# Patient Record
Sex: Male | Born: 1975 | Race: Black or African American | Hispanic: No | Marital: Married | Smoking: Former smoker
Health system: Southern US, Community
[De-identification: ages and names within clinical notes are randomized; demographics above are authoritative.]

## PROBLEM LIST (undated history)

## (undated) DIAGNOSIS — I639 Cerebral infarction, unspecified: Secondary | ICD-10-CM

## (undated) DIAGNOSIS — I10 Essential (primary) hypertension: Secondary | ICD-10-CM

---

## 2001-10-02 ENCOUNTER — Emergency Department (HOSPITAL_COMMUNITY): Admission: EM | Admit: 2001-10-02 | Discharge: 2001-10-02 | Payer: Self-pay

## 2006-11-12 DIAGNOSIS — I10 Essential (primary) hypertension: Secondary | ICD-10-CM | POA: Insufficient documentation

## 2006-11-19 ENCOUNTER — Inpatient Hospital Stay (HOSPITAL_COMMUNITY): Admission: EM | Admit: 2006-11-19 | Discharge: 2006-11-23 | Payer: Self-pay | Admitting: Emergency Medicine

## 2006-11-19 ENCOUNTER — Ambulatory Visit: Payer: Self-pay | Admitting: Internal Medicine

## 2006-11-28 ENCOUNTER — Ambulatory Visit: Payer: Self-pay | Admitting: Internal Medicine

## 2006-11-28 ENCOUNTER — Encounter (INDEPENDENT_AMBULATORY_CARE_PROVIDER_SITE_OTHER): Payer: Self-pay | Admitting: *Deleted

## 2006-11-29 DIAGNOSIS — N19 Unspecified kidney failure: Secondary | ICD-10-CM | POA: Insufficient documentation

## 2006-11-30 ENCOUNTER — Encounter (INDEPENDENT_AMBULATORY_CARE_PROVIDER_SITE_OTHER): Payer: Self-pay | Admitting: *Deleted

## 2006-12-31 ENCOUNTER — Ambulatory Visit: Payer: Self-pay | Admitting: Surgery

## 2007-02-18 ENCOUNTER — Telehealth: Payer: Self-pay | Admitting: *Deleted

## 2007-04-19 ENCOUNTER — Telehealth: Payer: Self-pay | Admitting: *Deleted

## 2007-04-24 ENCOUNTER — Ambulatory Visit: Payer: Self-pay | Admitting: Infectious Disease

## 2007-04-24 DIAGNOSIS — E785 Hyperlipidemia, unspecified: Secondary | ICD-10-CM

## 2007-12-10 ENCOUNTER — Telehealth: Payer: Self-pay | Admitting: Internal Medicine

## 2008-01-13 ENCOUNTER — Telehealth (INDEPENDENT_AMBULATORY_CARE_PROVIDER_SITE_OTHER): Payer: Self-pay | Admitting: Internal Medicine

## 2008-02-14 ENCOUNTER — Telehealth: Payer: Self-pay | Admitting: Infectious Diseases

## 2008-04-28 ENCOUNTER — Telehealth: Payer: Self-pay | Admitting: *Deleted

## 2010-08-09 NOTE — Discharge Summary (Signed)
Joseph Bright, Joseph Bright              ACCOUNT NO.:  [id number]   MEDICAL RECORD NO.:  [id number]          PATIENT TYPE:  INP   LOCATION:  6737                         FACILITY:  [facility]   PHYSICIAN:  C. Ulyess Mort, M.D.DATE OF BIRTH:  06-27-1975   DATE OF ADMISSION:  11/19/2006  DATE OF DISCHARGE:  11/23/2006                               DISCHARGE SUMMARY   DISCHARGE DIAGNOSES:  1. Hypertension.  2. Renal insufficiency, likely chronic.  3. Headaches, likely secondary to #1.  4. Proteinuria.  5. Anemia, likely due to chronic renal failure.  6. Hypercholesterolemia.   DISCHARGE MEDICATIONS:  1. Norvasc 10 mg by mouth daily.  2. Lasix 40 mg by mouth daily.  3. Labetalol 200 mg by mouth twice daily.  4. Zocor 20 mg by mouth daily.  5. Renagel 800 mg by mouth three times daily before meals.   DISPOSITION:  The patient is to follow up in the [facility] as well as with [facility] by  calling and scheduling an appointment on Tuesday, September 2.  During  those visits, he will have repeat complete metabolic panel as well as  magnesium and phosphorus checked.  He should also have a CBC at that  time.   PROCEDURE:  1. A CT of the head without contrast performed on November 19, 2006,      demonstrated subtle changes of periventricular white matter      suggesting microvascular disease, but no definite acute or focal      abnormality.  2. A chest x-ray performed on November 19, 2006, demonstrated no active      disease.  3. A renal ultrasound dated November 19, 2006, demonstrated no      hydronephrosis.  4. Electrocardiogram performed on November 19, 2006, demonstrated      nonspecific ST and T wave abnormalities in the lateral precordial      leads, but these were not deemed significant enough to associate      with myocardial infarction.   CONSULTATIONS:  Garnetta Buddy, M.D. of [facility].   HISTORY OF PRESENT  ILLNESS:  The patient is a 35 year old African-  American gentleman with no significant past medical history who presents  with complaints of headache with blurred vision of approximately two  days duration.  The patient reported a history of headaches in the past,  typically frontal or retro-orbital and squeezing in character, but  described his new headaches as feeling different.  The patient denied  that this headache was the worst headache of his life.  He described the  pain as occipital, near constant, and tight/squeezing.  He did endorse  vision changes for several days preceding his admission, reporting some  blurry vision since that time.  The patient reported the blurring to be  bilateral and to accompanied by some lightheadedness, but no syncope or  falls.  He denied nausea, vomiting, diarrhea, chest pain, or abdominal  pain.  The patient admitted to intermittent occasional dyspnea on  exertion with bilateral calf pain, but only at night while lying in bed.  The patient came to the ED secondary to concerns about persistence of  his headache and vision changes, which were severe enough to make him  almost unable to drive the morning of admission.  He has no history of  seizures, neck pain, neck stiffness, or photophobia.   PHYSICAL EXAMINATION:  VITAL SIGNS:  On admission temperature 99.2  degrees Fahrenheit, blood pressure 240/159, pulse 113, respirations 18,  oxygen saturation 100% on room air.  Weight was 116.6 kg and height was  68 inches.  Repeat blood pressure three hours after the initial  measurement was taken revealed a pressure of 169/103 and a pulse of 76.  GENERAL:  Awake, alert and oriented x3, lying in bed, in no acute  distress.  HEENT:  Normocephalic and atraumatic.  Pupils equal, round, and reactive  to light and accommodation.  Extraocular movements intact.  Oropharynx  clear, moist mucosal membrane.  NECK:  Supple without lymphadenopathy or thyromegaly.  No  bruits or JVD.  LUNGS:  Distant breath sounds bilaterally but no wheezes, rales, or  rhonchi.  CARDIOVASCULAR:  Regular rate and rhythm, normal S1 and S2.  No murmurs,  rubs, or gallops.  GASTROINTESTINAL:  Obese, soft, nontender, nondistended.  Normoactive  bowel sounds.  No masses.  EXTREMITIES:  Warm and well perfused without cyanosis, clubbing, or  edema.  SKIN:  No rashes.  MUSCULOSKELETAL:  Full range of motion with 5/5 strength in the  bilateral upper and lower extremities.  NEUROLOGY:  Grossly intact without focal deficits.  Cranial nerves II-  XII grossly intact.  PSYCHIATRIC:  Appropriate affect, interactive.   LABORATORY DATA:  Laboratory studies on the day of admission revealed:  Hemoglobin 13.7, hematocrit 39.6, white blood cell count 7.6, platelet  count 146, MCV 91.1, RDW 14.0, ANC 5.3.  Sodium 137, potassium 3.1,  chloride 103, bicarbonate 27, BUN 31, creatinine 4.8, glucose 103.  A  urinalysis was negative except for greater than 300 protein, and large  blood.  Urine microscopy was unremarkable.  Urine sodium was 33, urine  creatinine 212.8, the urine microalbumin to creatinine ratio was 2054.3.  A urine drug screen was negative.  TSH and free T4 were within normal  limits.   HOSPITAL COURSE:  Hypertension.  The patient's hypertension was  initially managed with intravenous medications in the emergency  department.  The patient remained on intravenous labetalol and was  transferred initially to a stepdown unit.  On the day following  admission, the patient was seen and evaluated by Dr. Hyman Hopes of [facility], who recommended a switch from intravenous medications  to oral medications.  These included Norvasc, Lasix, and labetalol.  The  patient's blood pressure responded well to these medications and by the  second day of admission was down to 161/99.  On the days following  admission the patient's blood pressure steadily declined reaching the  140's  to 150's over 80's to 100's on the day of discharge.  As part of  the patient's workup for possible causes of hypertension, many different  studies were done.  A serum renin activity was within normal limits.  Serum protein electrophoresis was essentially unremarkable, with no M-  spike.  An antinuclear antibody test as well as DNAse-B antibody test  was normal.  Intact parathyroid hormone was 170.5, which is elevated.  A  screen for hepatitis B and C antibodies were negative.  An HIV test was  negative.  A neutrophil cytoplasmic antibody IgG screen (ANCA) was  negative.  Metanephrines were unremarkable.  Serum aldosterone was  normal.  Finally, C3 and C4 complement levels were normal.  After ruling  out all of these other potential causes of secondary hypertension, it  was believed that the patient was suffering from essential hypertension.  1. Renal insufficiency.  The patient's creatinine on admission was 4.8      and failed to diminish significantly during this hospitalization,      with a discharge creatinine of 4.35.  In spite of the ultrasound      demonstrating relatively normal-sized kidneys, it was believed that      the patient was suffering from a chronic issue of hypertension      which normally leads to smaller or shrunken kidneys.  Dr. 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  As mentioned above, the      entire workup for other causes of hypertension (aside from benign      essential) was negative.  The patient is to follow up with Dr. Hyman Hopes      as an outpatient to discuss options for long term renal care,      likely including establishing access for hemodialysis.  2. Headaches with vision changes.  On the day of admission, the      patient reported a several-day history of headaches with blurring      of his vision.  It was  discussed with the patient at that time that      this change in vision may or may not be related to his      significantly elevated blood pressure.  However, during his      hospitalization, the patient's vision slowly improved and his      headaches resolved with no therapy aside from the treatment of his      hypertension.  On the day of discharge, the patient reported that      his vision had returned to near normal and that his headaches had      completely resolved.  It was strongly recommended to the patient      that he establish care with an ophthalmologist in the community to      have yearly eye exams.  3. Electrolyte anomalies likely related to renal insufficiency.      During his hospitalization, the patient did develop transient      hypokalemia (as low as 3.3), but remained asymptomatic.  This      responded well to oral potassium chloride.  Also, the patient was      noted to have a normal calcium during this hospitalization but an      elevated phosphorous (as high as 5.3).  This was attributed to a      secondary hyperparathyroidism, likely a result of this patient's      renal insufficiency.  Given these findings, the patient was started      on oral Renagel on the day before discharge.  On the day of      discharge, the patient's phosphorous had fallen to 4.9.  His      calcium remained normal, and his potassium was 3.4.  4. Anemia.  Given that the patient's admission hemoglobin and      hematocrit were normal, that he received IV fluids after admission,  and a repeat hemoglobin hematocrit on the 2nd day of admission was      slightly low, it was felt that this transient anemia was most      likely due to a dilutional effect.  However, a component of renal      failure contributing to this anemia cannot be ruled out.  5. Hypercholesterolemia.  Fasting lipid panel on the day following      admission showed a cholesterol of 239 with an LDL of 159, an HDL of      29,  and triglycerides of 256.  The patient was subsequently started      on Zocor 20 mg by mouth daily.  His liver function was normal on      the day of discharge.   DISCHARGE LABORATORY:  Laboratory studies on the day of admission  revealed:  Hemoglobin 12, hematocrit 34.2, white blood cell count 6.8,  platelet count 171.  Sodium 136, potassium 3.4, chloride 97, bicarbonate  28, BUN 30, creatinine 4.35, glucose 98.  AST 25, ALT 26, total protein  6.5, total bilirubin 0.9, alkaline phosphatase 81, calcium 9.7, albumin  3.7.  Magnesium was 1.9.  Phosphorous was 4.9.   DISCHARGE VITAL SIGNS:  Vital signs on the day of admission were:  Temperature 98.2 degrees Fahrenheit, blood pressure 154/104, pulse 81,  respirations 20.  Oxygen saturation was 99% on room air.      Madelaine Etienne, MD  Electronically Signed      C. Ulyess Mort, M.D.  Electronically Signed    JH/MEDQ  D:  12/23/2006  T:  12/23/2006  Job:  [id number]

## 2010-08-09 NOTE — Assessment & Plan Note (Signed)
OFFICE VISIT   Joseph Bright, Joseph Bright  DOB:  1976-01-03                                       12/31/2006  YNWGN#:[id number]   REASON FOR VISIT:  End-stage renal disease to evaluate for dialysis.   HISTORY:  This is a 35 year old gentleman with end-stage renal disease  not yet requiring dialysis who comes in for fistula creation.  His end-  stage renal disease is likely secondary to high blood pressure.  Again,  he is not requiring dialysis at this time.   REVIEW OF SYSTEMS:  All negative.   SOCIAL HISTORY:  He is single with 2 children.  Works in the healthcare  profession.  Does not smoke.  He has a history of smoking, but quit in  2000.  Does not drink.   PAST MEDICAL HISTORY:  Hypertension and hypercholesterolemia.   PAST SURGICAL HISTORY:  None.   MEDICATIONS:  Amlodipine 10 mg once a day.  Lasix 40 mg once a day.  Renagel 800 mg 3 pills daily.  Labetalol 200 mg twice daily.   PHYSICAL EXAM:  Heart rate 82, respirations 16, blood pressure 152/96,  saturation 100%.  General:  Well-appearing, in no acute distress.  Cardiovascular is regular.  Respirations nonlabored.  Abdomen is soft.  Extremities visible left cephalic vein at the level of the wrist.  Palpable radial pulse.   DIAGNOSTIC STUDIES:  Left upper arm vein mapping was performed today,  which shows the cephalic vein to be adequate to the level of the wrist.   ASSESSMENT AND PLAN:  End-stage renal disease requiring fistula  placement.   PLAN:  In this right-handed gentleman, we will plan on implanting a left  wrist fistula.  I discussed with the patient the likelihood of maturity  is about 85%.  There is a small risk of steal, which we discussed, which  would require ligation possibly of his graft.  The patient has not yet  made his ultimate decision of how he wishes to receive his dialysis,  whether it be peritoneal versus hemodialysis.  He is supposed to see his  kidney doctor today and he  will make this decision this week, and then  he will call the office to schedule this procedure.  If he elects for  hemodialysis, he will need a left wrist fistula.  Otherwise, we will do  a PV catheter placement.   Jorge Ny, MD  Electronically Signed   VWB/MEDQ  D:  12/31/2006  T:  01/01/2007  Job:  [id number]

## 2010-08-09 NOTE — Consult Note (Signed)
Joseph Bright, Joseph Bright NO.:  [id number]   MEDICAL RECORD NO.:  [id number]          PATIENT TYPE:  INP   LOCATION:  2928                         FACILITY:  [facility]   PHYSICIAN:  Garnetta Buddy, M.D.   DATE OF BIRTH:  1976-02-12   DATE OF CONSULTATION:  DATE OF DISCHARGE:                                 CONSULTATION   One-week history of feeling week, fatigued, nauseated with a one-day  history of severe headache, presented to the emergency room with a blood  pressure greater than 200/110, is treated as malignant hypertension, was  found to have an elevated creatinine of greater than 4 and a  protein/creatinine ratio of greater than 2.   PAST MEDICAL HISTORY:  Pertinent for:  1. Diagnosed hypertension possibly one year ago.  2. Also has a history of obesity and obstructive sleep apnea.   MEDICATIONS:  No regular medications, uses occasional ibuprofen.   SOCIAL HISTORY:  Lives with girlfriend, no HIV risk factors, no alcohol,  no cocaine, no tobacco, works at [employer].   REVIEW OF SYSTEMS:  GENERAL:  Admits to weakness and fatigue for about a  week.  EYES:  No visual loss, no diplopia, no blurred vision.  EARS/NOSE/MOUTH/THROAT:  No sinusitis, no sore throat, no epistaxis.  CARDIOVASCULAR:  No chest pain, palpitations, shortness of breath or  orthopnea.  RESPIRATORY SYSTEM:  No hemoptysis, no cough.  ABDOMINAL  SYSTEM:  No nausea, vomiting, diarrhea or weight loss.  UROGENITAL:  No  hematuria or frothy foaming noted in urine, no dysuria, no history of  STDs, nocturia x2, hesitancy and urgency x1 week.  MUSCULOSKELETAL:  Joint pains with use of ibuprofen, back pain with use of ibuprofen.  DERMATOLOGIC:  No rashes.   FAMILY HISTORY:  Positive for hypertension.   PHYSICAL EXAMINATION:  GENERAL:  Alert, very pleasant gentleman, no  obvious distress.  VITAL SIGNS:  Blood pressure 180/100, pulse of 90, temperature 99.5.  I's & O's:  1400 in, 1500 out.  HEENT:  Mild periorbital edema.  No pallor.  No jaundice.  Eyes revealed  grade 3/4 hypertensive changes with exudates, wool spots and flame  hemorrhages  NECK:  Supple.  JVP not elevated.  No carotid bruits.  CARDIOVASCULAR:  Regular rate and rhythm.  Left ventricular heave.  S4  gallop.  RESPIRATORY SYSTEM:  Lung fields are clear.  No wheeze or rales.  ABDOMEN:  Soft, nontender.  Bowel sounds present.  UROGENITAL:  Circumcised penis.  Testes were soft and no masses.  EXTREMITIES:  No peripheral edema.  Peripheral pulses intact.   Chest x-ray:  No pulmonary edema.   EKG:  LVH.   Blood sodium 140, potassium 3.6, chloride 104, CO2 29, BUN 30,  creatinine 4.7, glucose 105.  LDL 159, WBC 7, hemoglobin 11.4, platelets  126, TSH 1.7.  Urine drug screen negative.  Urine microscopy  unremarkable, but positive for large blood.   ASSESSMENT/PLAN:  1. Renal insufficiency, doubt acute, probably represents chronic      kidney disease secondary to hypertensive nephrosclerosis with      malignant hypertension component.  I think his hypertension and his      renal insufficiency are both chronic issues, as he has a bland      urine sediment and stigmata of severe prolonged hypertension.      Would recommend catecholamine screen as well as renal aldosterone      access evaluation.  Will check renal ultrasound, hep B, hep C, HIV,      SPEP, UPEP, and plan access if chronic renal insufficiency.  2. Hypertension.  Will switch from IV to oral Norvasc, continue oral      labetalol 200 b.i.d. and Lasix 40 mg once a day.   Will follow while in the hospital.      Garnetta Buddy, M.D.  Electronically Signed     MWW/MEDQ  D:  11/20/2006  T:  11/20/2006  Job:  [id number]

## 2010-08-09 NOTE — Procedures (Signed)
CEPHALIC VEIN MAPPING   INDICATION:  End-stage renal disease.   HISTORY:  End-stage renal disease.   EXAM:   The right cephalic vein is not evaluated.   The left cephalic vein is compressible.   Diameter measurements range from 0.29 cm to 0.37 cm.   See attached worksheet for all measurements.   IMPRESSION:  Patent's left cephalic vein, which is of acceptable  diameter for use as a dialysis access site.   ___________________________________________  V. Charlena Cross, MD   MG/MEDQ  D:  12/31/2006  T:  12/31/2006  Job:  [id number]

## 2010-08-09 NOTE — Discharge Summary (Signed)
[name], [name]              ACCOUNT NO.:  [id number]   MEDICAL RECORD NO.:  [id number]          PATIENT TYPE:  INP   LOCATION:  6737                         FACILITY:  [facility]   PHYSICIAN:  Joseph Bright, M.D.DATE OF BIRTH:  Nov 30, 1975   DATE OF ADMISSION:  11/19/2006  DATE OF DISCHARGE:  11/23/2006                               DISCHARGE SUMMARY   CONTINUATION OF DISCHARGE SUMMARY:   HOSPITAL COURSE:  1. ... After ruling out all of these other potential causes of      secondary hypertension, it was believed that the patient was      suffering from essential hypertension.  2. Renal insufficiency.  The patient's creatinine on admission was 4.8      and failed to diminish significantly during this hospitalization,      with a discharge creatinine of 4.35.  In spite of the ultrasound      demonstrating relatively normal-sized kidneys, it was believed that      the patient was suffering from a chronic issue of hypertension      which normally leads to smaller or shrunken kidneys.  Dr. 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  As mentioned above, the      entire workup for other causes of hypertension (aside from benign      essential) was negative.  The patient is to follow up with Dr. Hyman Hopes      as an outpatient to discuss options for long term renal care,      likely including establishing access for hemodialysis.  3. Headaches with vision changes.  On the day of admission, the      patient reported a several-day history of headaches with blurring      of his vision.  It was discussed with the patient at that time that      this change in vision may or may not be related to his      significantly elevated blood pressure.  However, during his      hospitalization, the patient's vision slowly improved and his      headaches resolved  with no therapy aside from the treatment of his      hypertension.  On the day of discharge, the patient reported that      his vision had returned to near normal and that his headaches had      completely resolved.  It was strongly recommended to the patient      that he establish care with an ophthalmologist in the community to      have yearly eye exams.  4. Electrolyte anomalies likely related to renal insufficiency.      During his hospitalization, the patient did develop transient      hypokalemia (as low as 3.3), but remained asymptomatic.  This      responded well to oral potassium chloride.  Also, the patient was  noted to have a normal calcium during this hospitalization but an      elevated phosphorous (as high as 5.3).  This was attributed to a      secondary hyperparathyroidism, likely a result of this patient's      renal insufficiency.  Given these findings, the patient was started      on oral Renagel on the day before discharge.  On the day of      discharge, the patient's phosphorous had fallen to 4.9.  His      calcium remained normal, and his potassium was 3.4.  5. Anemia.  Given that the patient's admission hemoglobin and      hematocrit were normal, that he received IV fluids after admission,      and a repeat hemoglobin hematocrit on the 2nd day of admission was      slightly low, it was felt that this transient anemia was most      likely due to a dilutional effect.  However, a component of renal      failure contributing to this anemia cannot be ruled out.  6. Hypercholesterolemia.  Fasting lipid panel on the day following      admission showed a cholesterol of 239 with an LDL of 159, an HDL of      29, and triglycerides of 256.  The patient was subsequently started      on Zocor 20 mg by mouth daily.  His liver function was normal on      the day of discharge.   DISCHARGE LABORATORY:  Laboratory studies on the day of admission  revealed:  Hemoglobin 12,  hematocrit 34.2, white blood cell count 6.8,  platelet count 171.  Sodium 136, potassium 3.4, chloride 97, bicarbonate  28, BUN 30, creatinine 4.35, glucose 98.  AST 25, ALT 26, total protein  6.5, total bilirubin 0.9, alkaline phosphatase 81, calcium 9.7, albumin  3.7.  Magnesium was 1.9.  Phosphorous was 4.9.   DISCHARGE VITAL SIGNS:  Vital signs on the day of admission were:  Temperature 98.2 degrees Fahrenheit, blood pressure 154/104, pulse 81,  respirations 20.  Oxygen saturation was 99% on room air.      Joseph Etienne, MD       Joseph Bright, M.D.     JH/MEDQ  D:  12/23/2006  T:  12/23/2006  Job:  [id number]

## 2011-01-06 LAB — PROTEIN ELECTROPH W RFLX QUANT IMMUNOGLOBULINS
Albumin ELP: 59.3
Alpha-1-Globulin: 5.4 — ABNORMAL HIGH
Beta 2: 5.5
Beta Globulin: 6.1
Gamma Globulin: 14.8

## 2011-01-06 LAB — UIFE/LIGHT CHAINS/TP QN, 24-HR UR
Albumin, U: DETECTED
Alpha 1, Urine: DETECTED — AB
Free Kappa Lt Chains,Ur: 16.5 — ABNORMAL HIGH (ref 0.04–1.51)
Free Kappa/Lambda Ratio: 4.84 — ABNORMAL HIGH (ref 0.46–4.00)
Free Lambda Excretion/Day: 112.53
Time: 24
Total Protein, Urine: 61.5
Volume, Urine: 3300

## 2011-01-06 LAB — RENAL FUNCTION PANEL
Albumin: 3.6
CO2: 28
Calcium: 9
Chloride: 102
GFR calc Af Amer: 20 — ABNORMAL LOW
GFR calc Af Amer: 21 — ABNORMAL LOW
GFR calc non Af Amer: 16 — ABNORMAL LOW
GFR calc non Af Amer: 17 — ABNORMAL LOW
Phosphorus: 3.9
Potassium: 3.3 — ABNORMAL LOW
Sodium: 144

## 2011-01-06 LAB — RAPID URINE DRUG SCREEN, HOSP PERFORMED
Amphetamines: NOT DETECTED
Opiates: NOT DETECTED
Tetrahydrocannabinol: NOT DETECTED

## 2011-01-06 LAB — HEPATITIS B SURFACE ANTIGEN: Hepatitis B Surface Ag: NEGATIVE

## 2011-01-06 LAB — CBC
HCT: 32.7 — ABNORMAL LOW
HCT: 39.6
Hemoglobin: 11.4 — ABNORMAL LOW
Hemoglobin: 13.7
MCHC: 34.3
MCHC: 34.8
Platelets: 156
RBC: 3.56 — ABNORMAL LOW
RBC: 3.65 — ABNORMAL LOW
RBC: 3.74 — ABNORMAL LOW
RBC: 4.34
RDW: 14.1 — ABNORMAL HIGH
RDW: 14.2 — ABNORMAL HIGH
WBC: 6.8
WBC: 7.6
WBC: 7.8

## 2011-01-06 LAB — COMPREHENSIVE METABOLIC PANEL
ALT: 26
AST: 25
CO2: 28
Calcium: 9.7
Chloride: 97
GFR calc Af Amer: 19 — ABNORMAL LOW
GFR calc non Af Amer: 16 — ABNORMAL LOW
Sodium: 136
Total Bilirubin: 0.9

## 2011-01-06 LAB — DIFFERENTIAL
Basophils Absolute: 0.1
Basophils Relative: 1
Eosinophils Relative: 3
Eosinophils Relative: 4
Lymphocytes Relative: 15
Lymphocytes Relative: 20
Lymphs Abs: 1.1
Monocytes Absolute: 0.8 — ABNORMAL HIGH
Monocytes Absolute: 0.8 — ABNORMAL HIGH
Monocytes Relative: 12 — ABNORMAL HIGH
Neutro Abs: 4.5

## 2011-01-06 LAB — BASIC METABOLIC PANEL
BUN: 30 — ABNORMAL HIGH
CO2: 29
Calcium: 8.9
GFR calc non Af Amer: 15 — ABNORMAL LOW
Glucose, Bld: 105 — ABNORMAL HIGH

## 2011-01-06 LAB — CARDIAC PANEL(CRET KIN+CKTOT+MB+TROPI)
CK, MB: 2.7
Relative Index: 0.7
Troponin I: 0.1 — ABNORMAL HIGH

## 2011-01-06 LAB — CK TOTAL AND CKMB (NOT AT ARMC)
CK, MB: 3.3
Total CK: 451 — ABNORMAL HIGH

## 2011-01-06 LAB — CREATININE, URINE, RANDOM: Creatinine, Urine: 212.8

## 2011-01-06 LAB — URINALYSIS, ROUTINE W REFLEX MICROSCOPIC
Bilirubin Urine: NEGATIVE
Nitrite: NEGATIVE
Protein, ur: >300 — AB
Specific Gravity, Urine: 1.019
Urobilinogen, UA: 0.2

## 2011-01-06 LAB — I-STAT 8, (EC8 V) (CONVERTED LAB)
BUN: 31 — ABNORMAL HIGH
Glucose, Bld: 103 — ABNORMAL HIGH
HCT: 43
Hemoglobin: 14.6
Operator id: 151321
Potassium: 3.1 — ABNORMAL LOW
Sodium: 137
TCO2: 28

## 2011-01-06 LAB — TSH: TSH: 1.722

## 2011-01-06 LAB — HEPATITIS C ANTIBODY: HCV Ab: NEGATIVE

## 2011-01-06 LAB — LIPID PANEL
Cholesterol: 239 — ABNORMAL HIGH
HDL: 29 — ABNORMAL LOW
LDL Cholesterol: 159 — ABNORMAL HIGH
Total CHOL/HDL Ratio: 8.2
Triglycerides: 256 — ABNORMAL HIGH

## 2011-01-06 LAB — URINE MICROSCOPIC-ADD ON

## 2011-01-06 LAB — MICROALBUMIN / CREATININE URINE RATIO
Creatinine, Urine: 213.7
Microalb, Ur: 439 — ABNORMAL HIGH

## 2011-01-06 LAB — PTH, INTACT AND CALCIUM
Calcium, Total (PTH): 8.9
PTH: 170.5 — ABNORMAL HIGH

## 2011-01-06 LAB — HIV ANTIBODY (ROUTINE TESTING W REFLEX): HIV: NONREACTIVE

## 2011-01-06 LAB — METANEPHRINES, URINE, 24 HOUR: Normetanephrine, 24H Ur: 438 mcg/24 h (ref 44–540)

## 2011-01-06 LAB — T4, FREE: Free T4: 1.25

## 2015-04-01 ENCOUNTER — Other Ambulatory Visit: Payer: Self-pay | Admitting: Family Medicine

## 2015-04-01 ENCOUNTER — Ambulatory Visit
Admission: RE | Admit: 2015-04-01 | Discharge: 2015-04-01 | Disposition: A | Discharge status: Home / Self Care | Payer: Commercial Managed Care - HMO | Source: Ambulatory Visit | Attending: Family Medicine | Admitting: Family Medicine

## 2015-04-01 DIAGNOSIS — R7611 Nonspecific reaction to tuberculin skin test without active tuberculosis: Secondary | ICD-10-CM

## 2016-08-02 IMAGING — CR DG CHEST 2V
2 series · 2 of 2 positions shown · non-contrast
Comparison: November 19, 2006

CLINICAL DATA: Positive tuberculin skin test

EXAM:
CHEST  2 VIEW

[w chest pa]
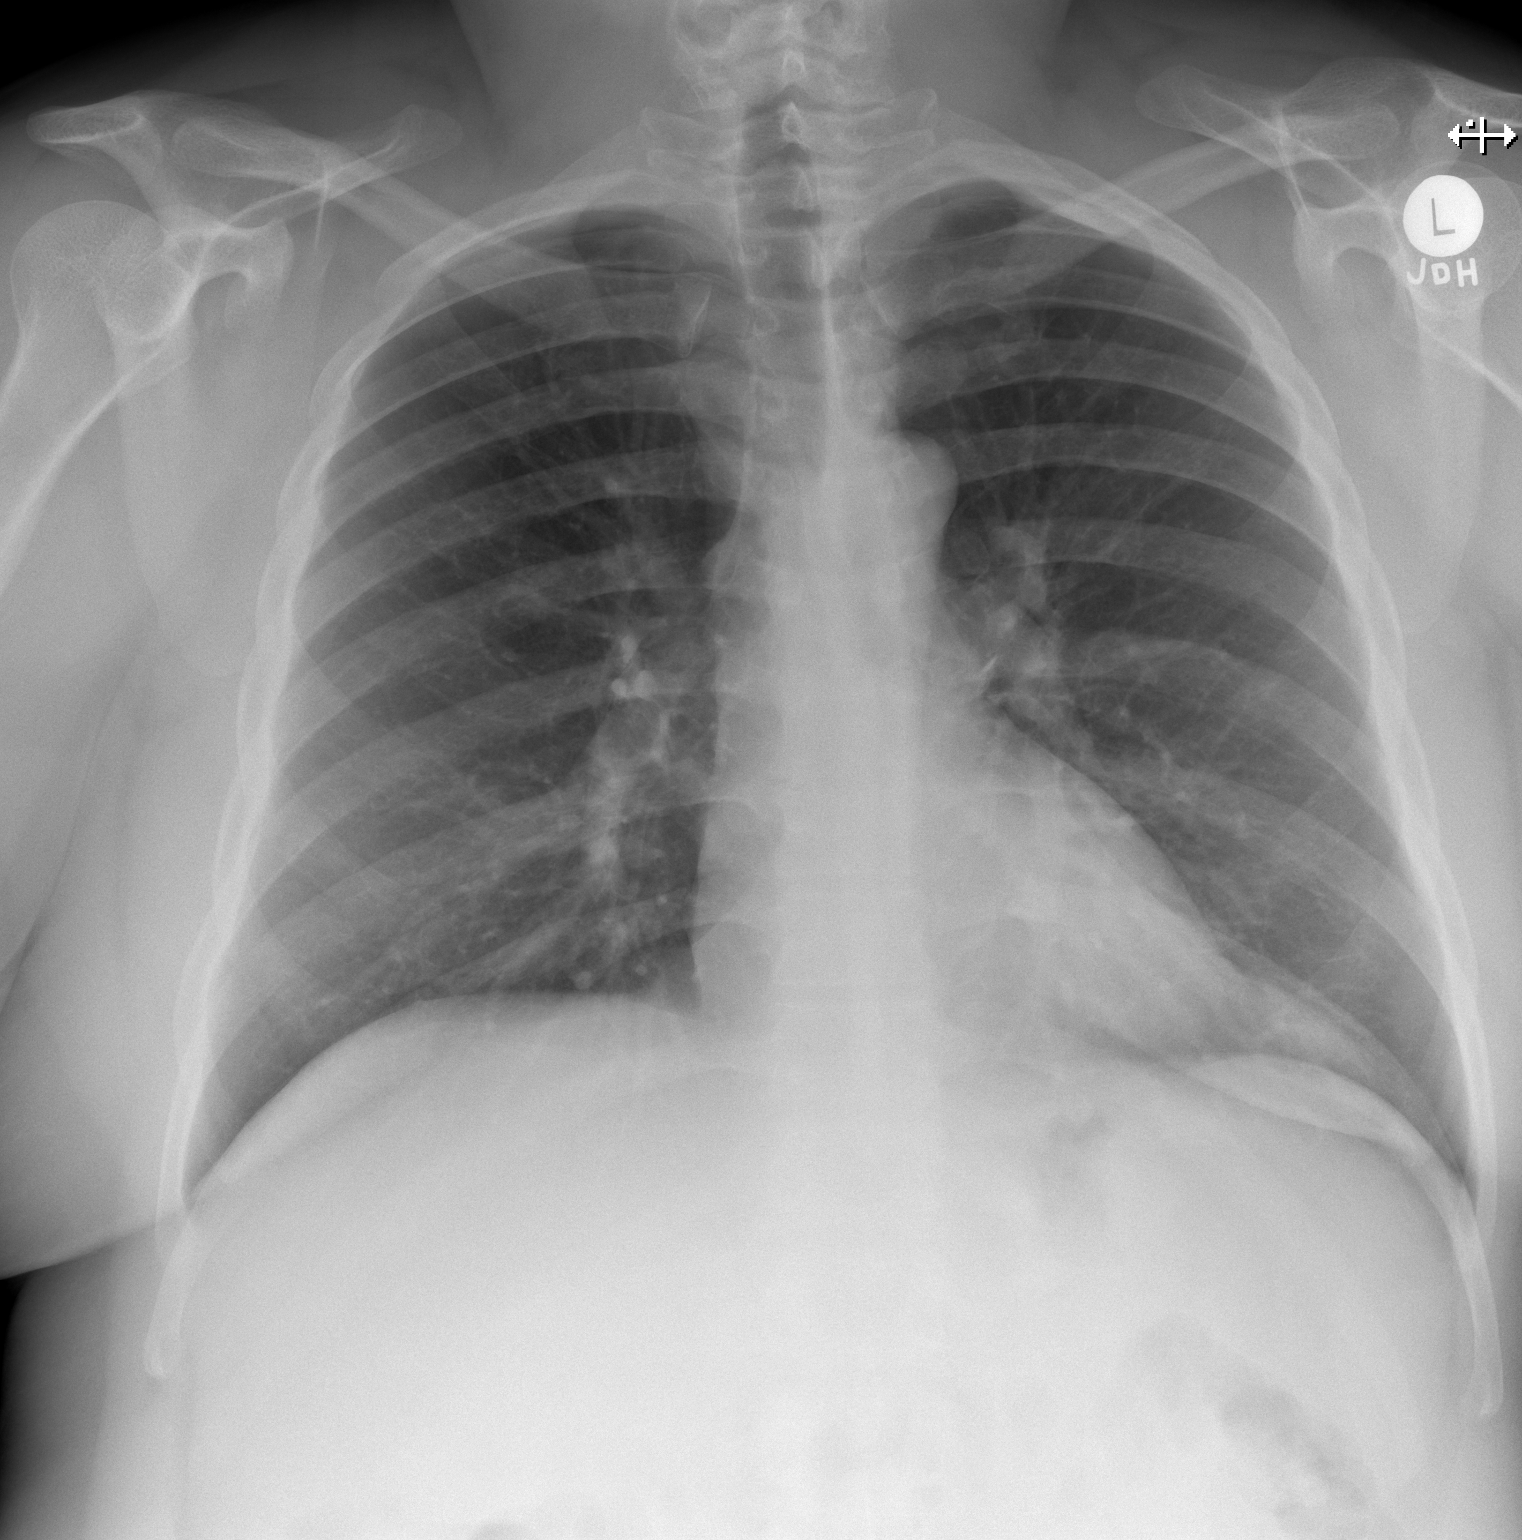

[w chest lat]
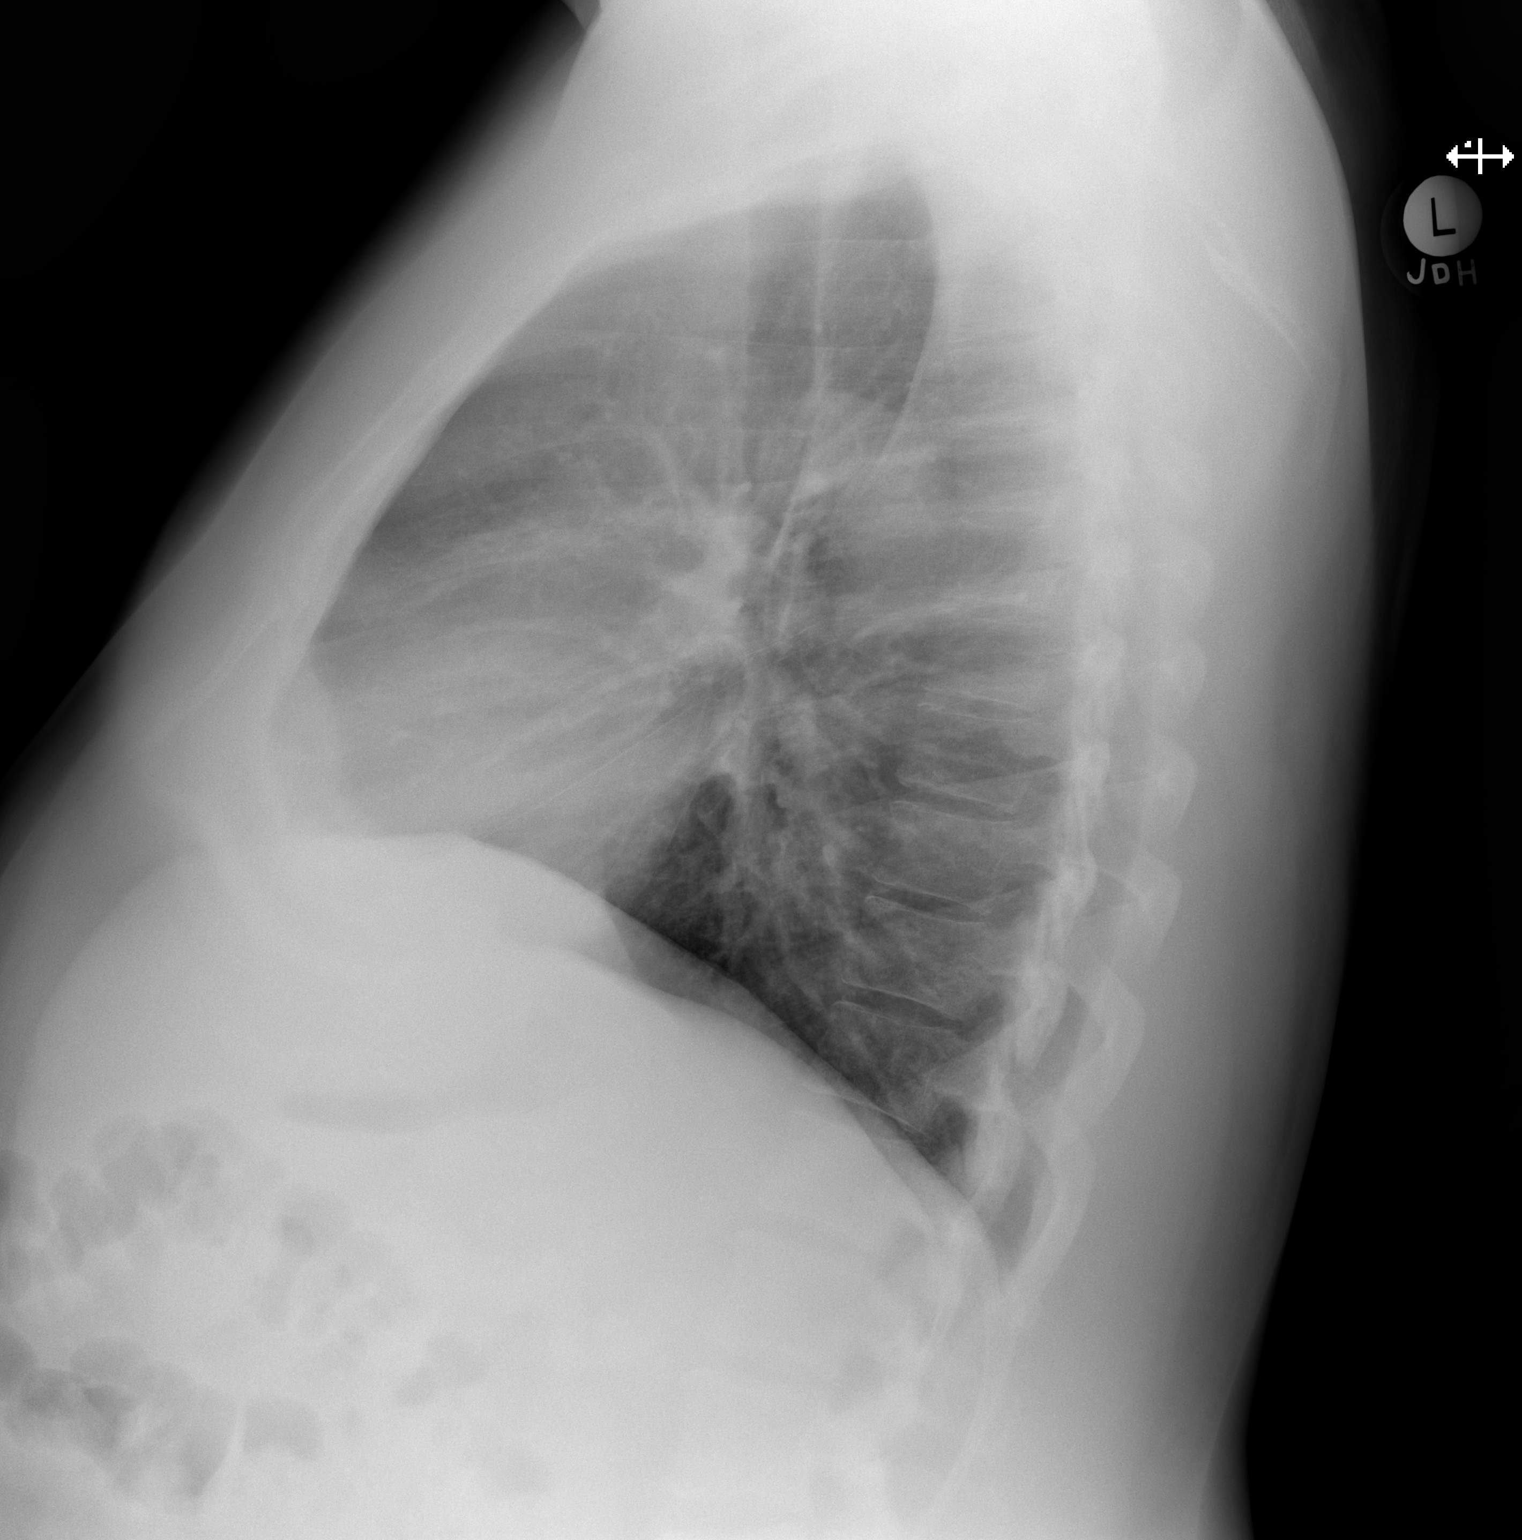

[2 of 2 positions shown; findings below may reference images not displayed]

FINDINGS: Lungs are clear. Heart size and pulmonary vascularity are normal. No
adenopathy. No bone lesions.
IMPRESSION: No abnormality noted.

## 2018-04-16 ENCOUNTER — Ambulatory Visit (INDEPENDENT_AMBULATORY_CARE_PROVIDER_SITE_OTHER): Payer: Managed Care, Other (non HMO) | Admitting: Family Medicine

## 2018-04-16 ENCOUNTER — Encounter (INDEPENDENT_AMBULATORY_CARE_PROVIDER_SITE_OTHER): Payer: Self-pay | Admitting: Family Medicine

## 2018-04-16 ENCOUNTER — Ambulatory Visit (INDEPENDENT_AMBULATORY_CARE_PROVIDER_SITE_OTHER): Payer: Managed Care, Other (non HMO)

## 2018-04-16 DIAGNOSIS — M5442 Lumbago with sciatica, left side: Secondary | ICD-10-CM

## 2018-04-16 MED ORDER — BACLOFEN 10 MG PO TABS: 10.0000 mg | ORAL_TABLET | Freq: Every evening | ORAL | 3 refills | Status: DC | PRN

## 2018-04-16 MED ORDER — TRAMADOL HCL 50 MG PO TABS: 50.0000 mg | ORAL_TABLET | Freq: Every evening | ORAL | 0 refills | Status: DC | PRN

## 2018-04-16 NOTE — Progress Notes (Signed)
Amlodipine 10 mg qd Hydralazine 50 mg bid Isosorbide ER 60 mg qd Labetalol 200 mg bid Simvastatin 20 mg qd     Office Visit Note   Patient: Joseph Bright           Date of Birth: [birth year]           MRN: [id number] Visit Date: 04/16/2018 Requested by: No referring provider defined for this encounter. PCP: Johny Blamer, MD  Subjective: Chief Complaint  Patient presents with  . Lower Back - Pain    Left lower back pain, radiating down lateral hip/thigh to knee x 1 year, but worse over the past 2 months. Occasional buttock pain. Occasional groin pain.    HPI: He is a 43 year old with low back and left posterior hip pain.  Symptoms started about 2 years ago, no injury.  Gradual onset of pain manageable at first with over-the-counter medicine, but in the past couple months it is gotten significantly worse and is keeping him awake at night.  He cannot seem to get comfortable.  Pain does not radiate below the knee, he has not noticed any weakness or numbness in his legs and does not have any bowel or bladder dysfunction.  No previous problems with his back.  He went to his PCP and was given a prednisone Dosepak which helped temporarily but the pain came right back when the medicine ended.               ROS: He has hypertension, chronic kidney disease and hyperlipidemia.  Other systems were reviewed and are negative.  Objective: Vital Signs: There were no vitals taken for this visit.  Physical Exam:  Low back: No visible rash, he is tender near the left sacroiliac joint and in the paraspinous muscles near the L5 level.  No pain in the sciatic notch, negative straight leg raise, no pain with internal hip rotation.  Lower extremity strength and reflexes remain normal.  Imaging: X-rays lumbar spine: Normal anatomic alignment with mild to moderate degenerative disc disease at L4-5 and L5-S1.  No sign of compression deformity or neoplasm.   Assessment & Plan: #1 chronic low back pain  with left-sided sciatica, neurologic exam is nonfocal. -Trial of home exercises combined with physical therapy.  Muscle relaxant and tramadol to take as needed at night. -MRI if he fails conservative management.  Could contemplate epidural injection or possibly chiropractic depending on MRI results.   Follow-Up Instructions: Return if symptoms worsen or fail to improve.      Procedures: No procedures performed  No notes on file    PMFS History: Patient Active Problem List   Diagnosis Date Noted  . DYSLIPIDEMIA 04/24/2007  . RENAL FAILURE NOS 11/29/2006  . HYPERTENSION, ESSENTIAL NOS 11/12/2006   History reviewed. No pertinent past medical history.  History reviewed. No pertinent family history.  History reviewed. No pertinent surgical history. Social History   Occupational History  . Not on file  Tobacco Use  . Smoking status: Former Smoker    Types: Cigarettes    Start date: 04/17/1995    Last attempt to quit: 04/16/2000    Years since quitting: 18.0  . Smokeless tobacco: Never Used  Substance and Sexual Activity  . Alcohol use: Yes    Comment: weekends/special events  . Drug use: Never  . Sexual activity: Not on file

## 2018-05-01 ENCOUNTER — Other Ambulatory Visit (INDEPENDENT_AMBULATORY_CARE_PROVIDER_SITE_OTHER): Payer: Self-pay | Admitting: Family Medicine

## 2018-05-22 ENCOUNTER — Other Ambulatory Visit (INDEPENDENT_AMBULATORY_CARE_PROVIDER_SITE_OTHER): Payer: Self-pay | Admitting: Family Medicine

## 2018-06-24 ENCOUNTER — Other Ambulatory Visit (INDEPENDENT_AMBULATORY_CARE_PROVIDER_SITE_OTHER): Payer: Self-pay | Admitting: Family Medicine

## 2018-08-05 ENCOUNTER — Other Ambulatory Visit (INDEPENDENT_AMBULATORY_CARE_PROVIDER_SITE_OTHER): Payer: Self-pay | Admitting: Family Medicine

## 2018-08-07 ENCOUNTER — Other Ambulatory Visit (INDEPENDENT_AMBULATORY_CARE_PROVIDER_SITE_OTHER): Payer: Self-pay | Admitting: Family Medicine

## 2020-02-09 ENCOUNTER — Other Ambulatory Visit: Payer: Self-pay | Admitting: Nephrology

## 2020-02-09 DIAGNOSIS — N1832 Chronic kidney disease, stage 3b: Secondary | ICD-10-CM

## 2020-02-11 ENCOUNTER — Other Ambulatory Visit: Payer: Commercial Managed Care - HMO

## 2020-02-24 ENCOUNTER — Ambulatory Visit
Admission: RE | Admit: 2020-02-24 | Discharge: 2020-02-24 | Disposition: A | Discharge status: Home / Self Care | Payer: Managed Care, Other (non HMO) | Source: Ambulatory Visit | Attending: Nephrology | Admitting: Nephrology

## 2020-02-24 DIAGNOSIS — N1832 Chronic kidney disease, stage 3b: Secondary | ICD-10-CM

## 2021-06-27 IMAGING — US US RENAL
1 series · 14 of 25 positions shown · non-contrast
Comparison: Renal ultrasound November 20, 2006

CLINICAL DATA: History of CKD

EXAM:
RENAL / URINARY TRACT ULTRASOUND COMPLETE

[Series 1: us renal · 0.28mm/px · 14 of 54 slices shown]
[im 1/54]
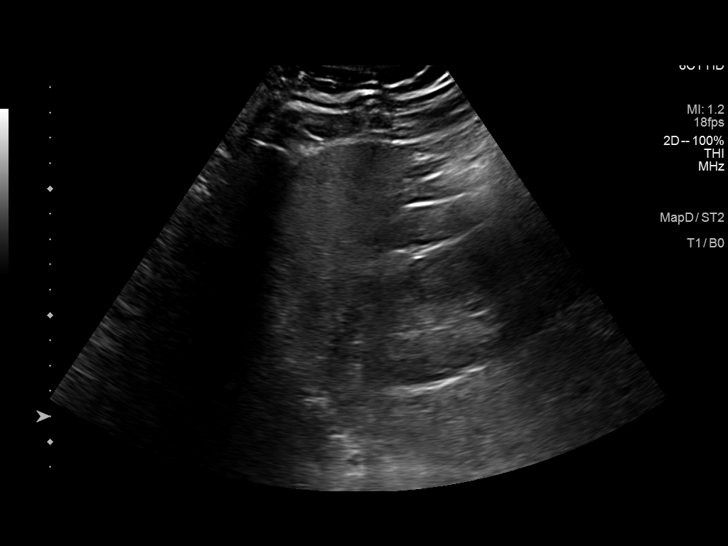
[im 5/54]
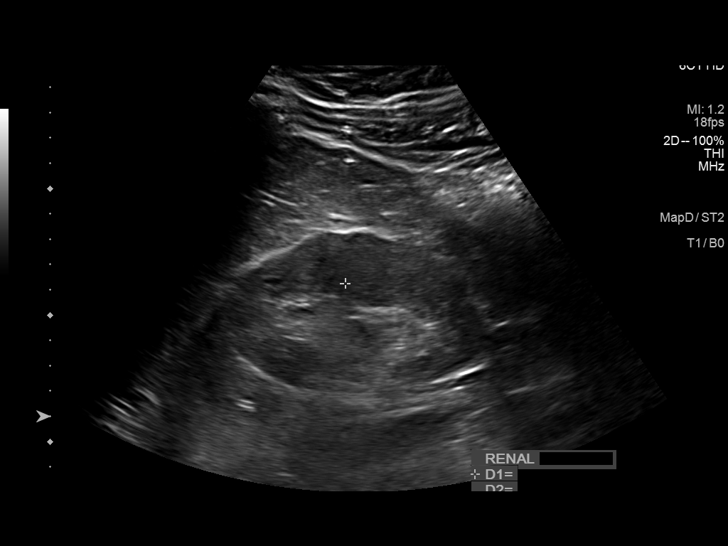
[im 9/54]
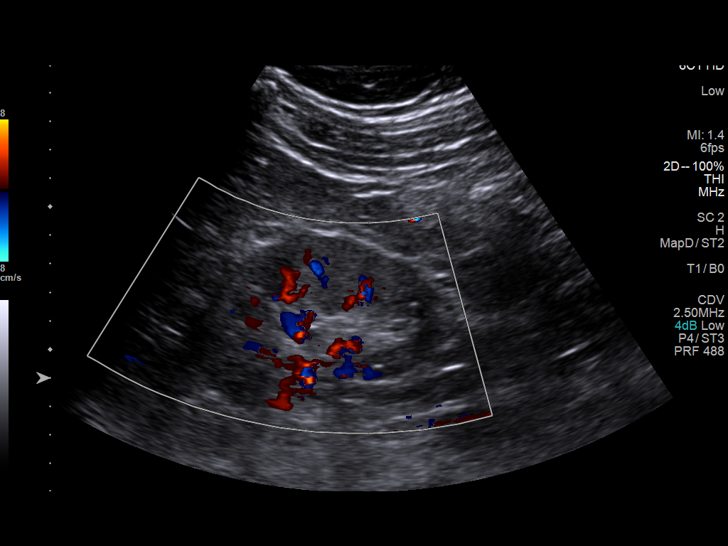
[im 14/54]
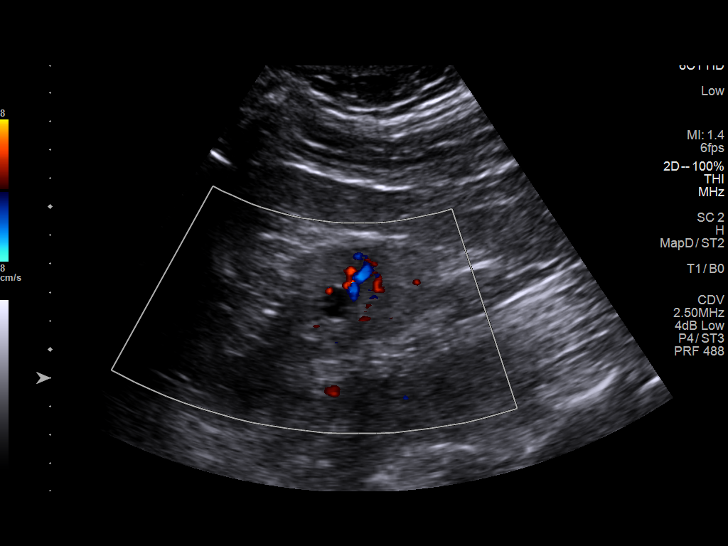
[im 18/54]
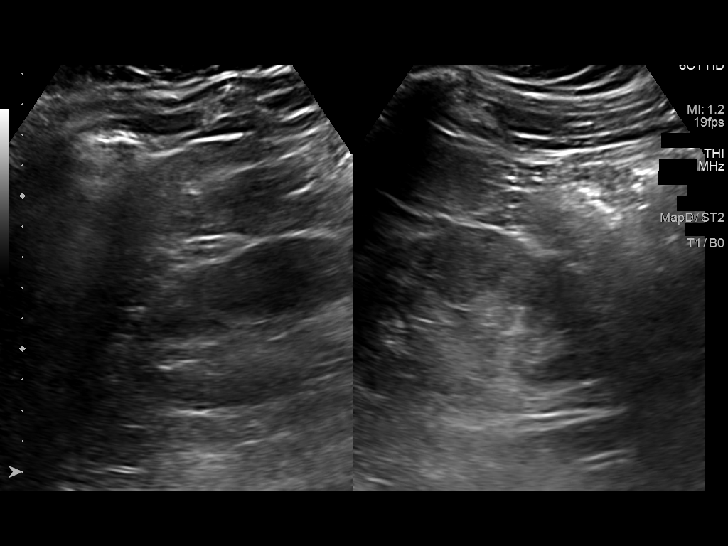
[im 20/54]
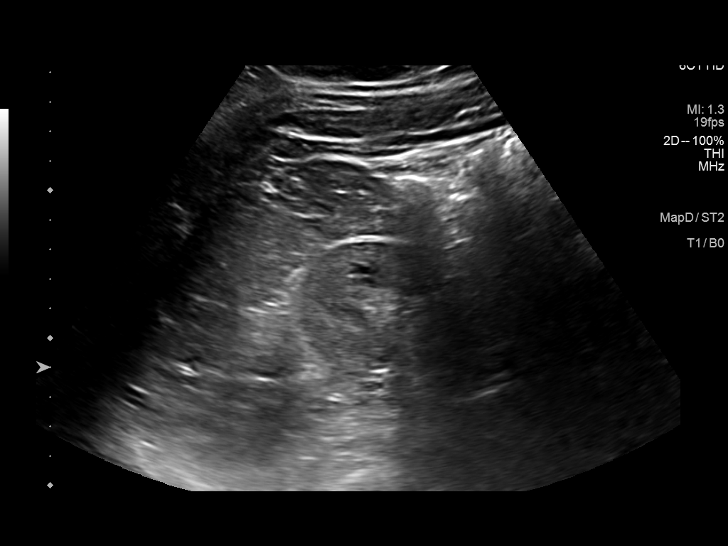
[im 25/54]
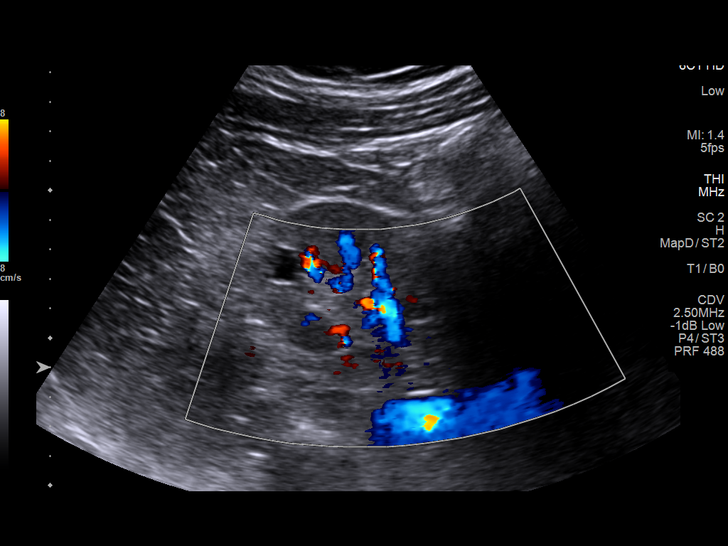
[im 29/54]
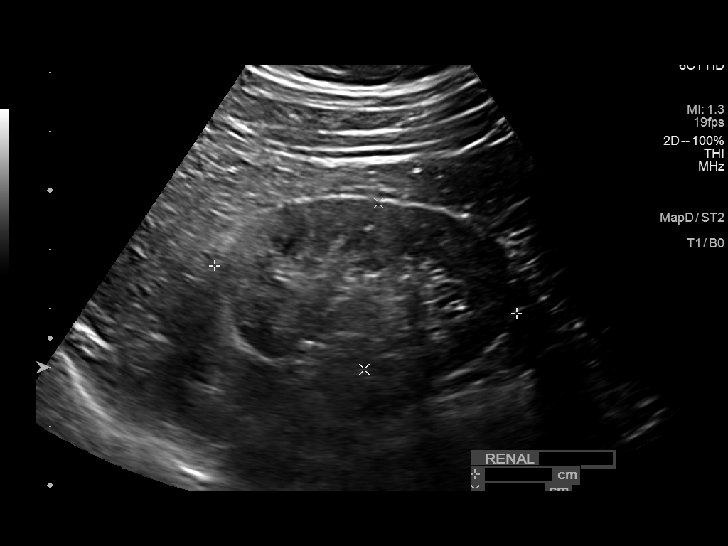
[im 34/54]
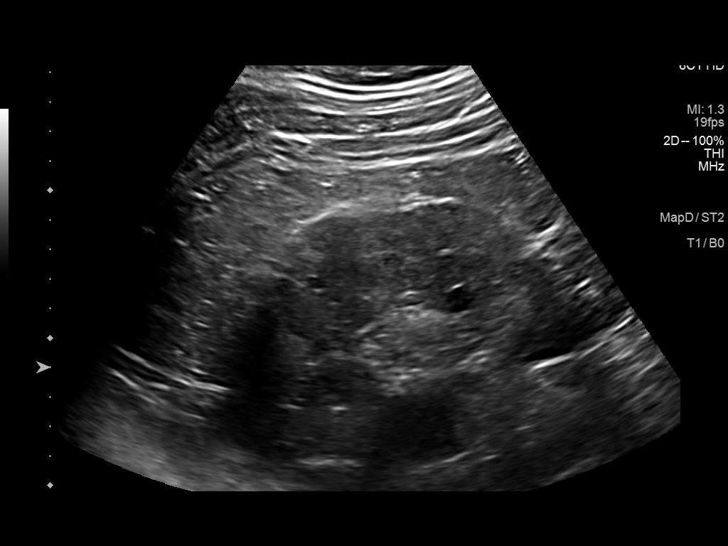
[im 36/54]
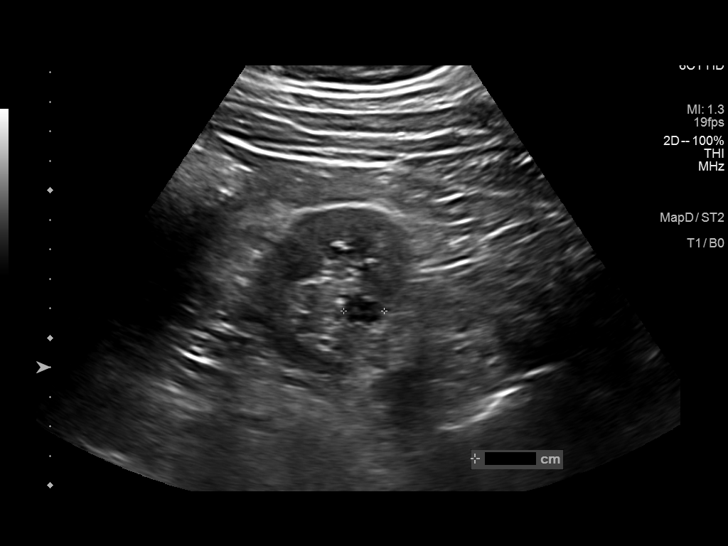
[im 40/54]
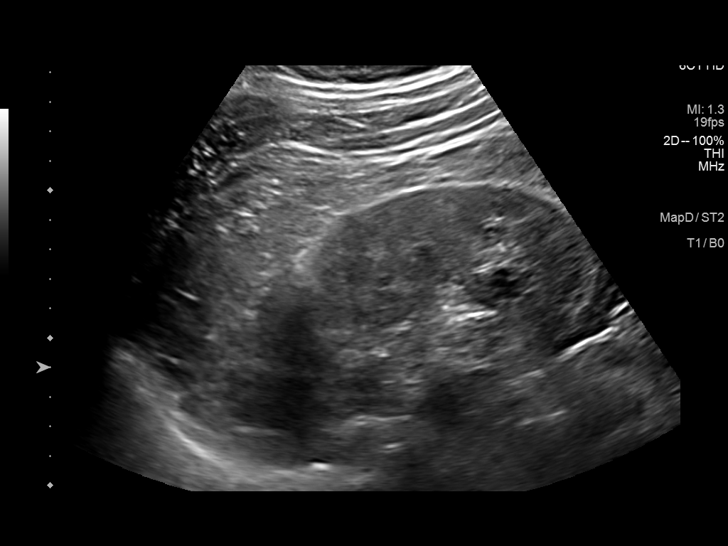
[im 45/54]
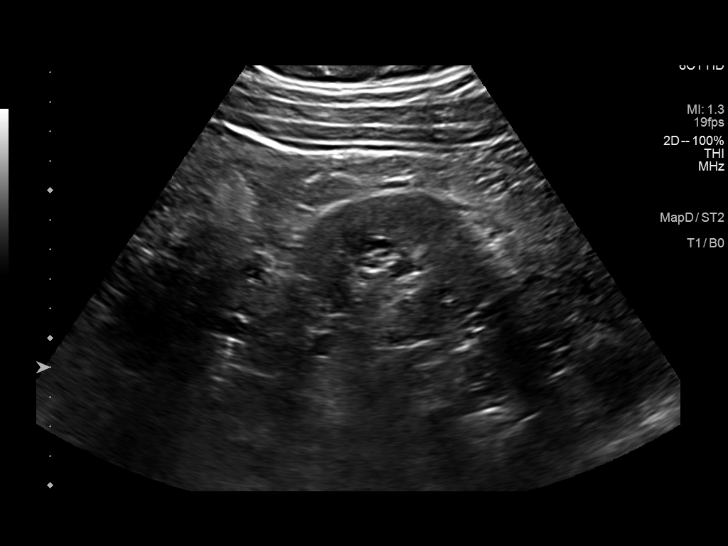
[im 49/54]
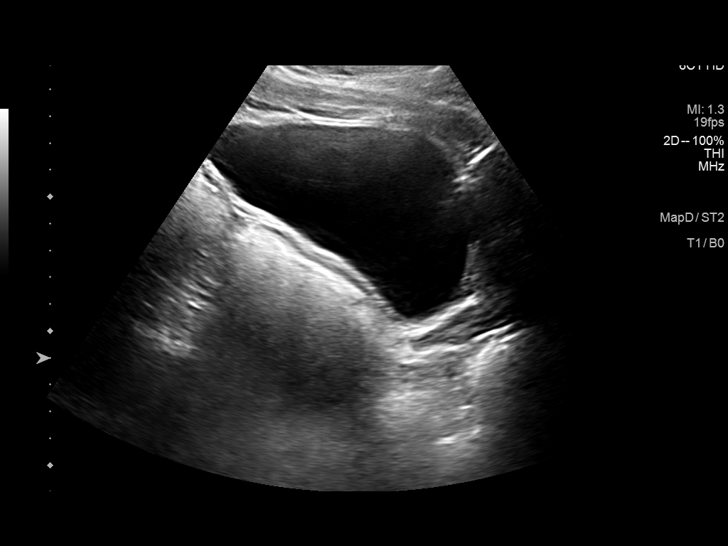
[im 54/54]
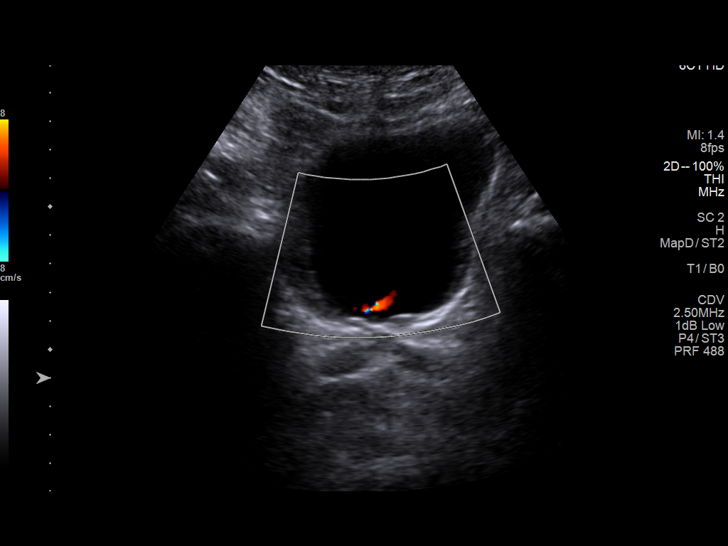

[14 of 25 positions shown; findings below may reference images not displayed]

FINDINGS: Right Kidney:

Renal measurements: 11.2 x 6.1 x 4.9 cm = volume: 176 mL. Increased
echogenicity of the renal parenchyma. No hydronephrosis. 1.4 cm
renal cyst.

Left Kidney:

Renal measurements: 10.4 x 5.7 x 5.5 cm = volume: 167 mL. Increased
echogenicity the renal parenchyma. No hydronephrosis. 1.4 cm renal
cyst.

Bladder:

Appears normal for degree of bladder distention. Visualized
bilateral ureteral jets.

Other:

None.
IMPRESSION: 1. No hydronephrosis.
2. Increased echogenicity of the renal parenchyma bilaterally, in
keeping with medical renal disease.

## 2022-04-05 ENCOUNTER — Ambulatory Visit: Payer: Managed Care, Other (non HMO) | Admitting: Internal Medicine

## 2022-07-25 ENCOUNTER — Emergency Department (HOSPITAL_COMMUNITY): Payer: Managed Care, Other (non HMO)

## 2022-07-25 ENCOUNTER — Inpatient Hospital Stay (HOSPITAL_COMMUNITY): Payer: Managed Care, Other (non HMO)

## 2022-07-25 ENCOUNTER — Other Ambulatory Visit: Payer: Self-pay

## 2022-07-25 ENCOUNTER — Encounter (HOSPITAL_COMMUNITY)
Admission: EM | Disposition: A | Discharge status: Rehabilitation Facility | Payer: Self-pay | Source: Home / Self Care | Attending: Neurology

## 2022-07-25 ENCOUNTER — Inpatient Hospital Stay (HOSPITAL_COMMUNITY)
Admission: EM | Admit: 2022-07-25 | Discharge: 2022-08-01 | DRG: 023 | Disposition: A | Discharge status: Rehabilitation Facility | Payer: Managed Care, Other (non HMO) | Attending: Student in an Organized Health Care Education/Training Program | Admitting: Student in an Organized Health Care Education/Training Program

## 2022-07-25 ENCOUNTER — Encounter (HOSPITAL_COMMUNITY): Payer: Self-pay | Admitting: Student in an Organized Health Care Education/Training Program

## 2022-07-25 ENCOUNTER — Inpatient Hospital Stay (HOSPITAL_COMMUNITY): Payer: Managed Care, Other (non HMO) | Admitting: Certified Registered Nurse Anesthetist

## 2022-07-25 DIAGNOSIS — N179 Acute kidney failure, unspecified: Secondary | ICD-10-CM | POA: Diagnosis present

## 2022-07-25 DIAGNOSIS — R1310 Dysphagia, unspecified: Secondary | ICD-10-CM | POA: Diagnosis present

## 2022-07-25 DIAGNOSIS — K5901 Slow transit constipation: Secondary | ICD-10-CM | POA: Diagnosis not present

## 2022-07-25 DIAGNOSIS — N184 Chronic kidney disease, stage 4 (severe): Secondary | ICD-10-CM | POA: Diagnosis present

## 2022-07-25 DIAGNOSIS — G9349 Other encephalopathy: Secondary | ICD-10-CM | POA: Diagnosis present

## 2022-07-25 DIAGNOSIS — R739 Hyperglycemia, unspecified: Secondary | ICD-10-CM | POA: Diagnosis present

## 2022-07-25 DIAGNOSIS — I160 Hypertensive urgency: Secondary | ICD-10-CM | POA: Diagnosis present

## 2022-07-25 DIAGNOSIS — E875 Hyperkalemia: Secondary | ICD-10-CM | POA: Diagnosis present

## 2022-07-25 DIAGNOSIS — E8721 Acute metabolic acidosis: Secondary | ICD-10-CM | POA: Diagnosis present

## 2022-07-25 DIAGNOSIS — E872 Acidosis, unspecified: Secondary | ICD-10-CM | POA: Diagnosis not present

## 2022-07-25 DIAGNOSIS — Z79899 Other long term (current) drug therapy: Secondary | ICD-10-CM | POA: Diagnosis not present

## 2022-07-25 DIAGNOSIS — I639 Cerebral infarction, unspecified: Principal | ICD-10-CM

## 2022-07-25 DIAGNOSIS — I63522 Cerebral infarction due to unspecified occlusion or stenosis of left anterior cerebral artery: Secondary | ICD-10-CM

## 2022-07-25 DIAGNOSIS — R531 Weakness: Secondary | ICD-10-CM | POA: Diagnosis present

## 2022-07-25 DIAGNOSIS — J9589 Other postprocedural complications and disorders of respiratory system, not elsewhere classified: Secondary | ICD-10-CM

## 2022-07-25 DIAGNOSIS — Z91199 Patient's noncompliance with other medical treatment and regimen due to unspecified reason: Secondary | ICD-10-CM

## 2022-07-25 DIAGNOSIS — E785 Hyperlipidemia, unspecified: Secondary | ICD-10-CM | POA: Diagnosis present

## 2022-07-25 DIAGNOSIS — E669 Obesity, unspecified: Secondary | ICD-10-CM | POA: Diagnosis not present

## 2022-07-25 DIAGNOSIS — Z6838 Body mass index (BMI) 38.0-38.9, adult: Secondary | ICD-10-CM

## 2022-07-25 DIAGNOSIS — G4733 Obstructive sleep apnea (adult) (pediatric): Secondary | ICD-10-CM | POA: Diagnosis present

## 2022-07-25 DIAGNOSIS — Z1152 Encounter for screening for COVID-19: Secondary | ICD-10-CM | POA: Diagnosis not present

## 2022-07-25 DIAGNOSIS — I129 Hypertensive chronic kidney disease with stage 1 through stage 4 chronic kidney disease, or unspecified chronic kidney disease: Secondary | ICD-10-CM | POA: Diagnosis present

## 2022-07-25 DIAGNOSIS — R569 Unspecified convulsions: Secondary | ICD-10-CM | POA: Diagnosis not present

## 2022-07-25 DIAGNOSIS — G936 Cerebral edema: Secondary | ICD-10-CM | POA: Diagnosis present

## 2022-07-25 DIAGNOSIS — R471 Dysarthria and anarthria: Secondary | ICD-10-CM | POA: Diagnosis present

## 2022-07-25 DIAGNOSIS — R4701 Aphasia: Secondary | ICD-10-CM | POA: Diagnosis present

## 2022-07-25 DIAGNOSIS — Z87891 Personal history of nicotine dependence: Secondary | ICD-10-CM

## 2022-07-25 DIAGNOSIS — I10 Essential (primary) hypertension: Secondary | ICD-10-CM | POA: Diagnosis not present

## 2022-07-25 DIAGNOSIS — G8191 Hemiplegia, unspecified affecting right dominant side: Secondary | ICD-10-CM | POA: Diagnosis present

## 2022-07-25 DIAGNOSIS — F4323 Adjustment disorder with mixed anxiety and depressed mood: Secondary | ICD-10-CM | POA: Diagnosis not present

## 2022-07-25 DIAGNOSIS — Z91148 Patient's other noncompliance with medication regimen for other reason: Secondary | ICD-10-CM

## 2022-07-25 DIAGNOSIS — I69351 Hemiplegia and hemiparesis following cerebral infarction affecting right dominant side: Secondary | ICD-10-CM | POA: Diagnosis not present

## 2022-07-25 DIAGNOSIS — I5032 Chronic diastolic (congestive) heart failure: Secondary | ICD-10-CM | POA: Diagnosis not present

## 2022-07-25 DIAGNOSIS — R29718 NIHSS score 18: Secondary | ICD-10-CM | POA: Diagnosis present

## 2022-07-25 DIAGNOSIS — I63422 Cerebral infarction due to embolism of left anterior cerebral artery: Secondary | ICD-10-CM | POA: Diagnosis present

## 2022-07-25 DIAGNOSIS — I6329 Cerebral infarction due to unspecified occlusion or stenosis of other precerebral arteries: Secondary | ICD-10-CM | POA: Diagnosis not present

## 2022-07-25 DIAGNOSIS — K5900 Constipation, unspecified: Secondary | ICD-10-CM | POA: Diagnosis not present

## 2022-07-25 HISTORY — DX: Essential (primary) hypertension: I10

## 2022-07-25 HISTORY — PX: RADIOLOGY WITH ANESTHESIA: SHX6223

## 2022-07-25 HISTORY — PX: IR PERCUTANEOUS ART THROMBECTOMY/INFUSION INTRACRANIAL INC DIAG ANGIO: IMG6087

## 2022-07-25 HISTORY — DX: Cerebral infarction, unspecified: I63.9

## 2022-07-25 LAB — MRSA NEXT GEN BY PCR, NASAL: MRSA by PCR Next Gen: NOT DETECTED

## 2022-07-25 LAB — COMPREHENSIVE METABOLIC PANEL
ALT: 29 U/L (ref 0–44)
AST: 28 U/L (ref 15–41)
Albumin: 3.8 g/dL (ref 3.5–5.0)
Alkaline Phosphatase: 66 U/L (ref 38–126)
Anion gap: 13 (ref 5–15)
BUN: 36 mg/dL — ABNORMAL HIGH (ref 6–20)
CO2: 18 mmol/L — ABNORMAL LOW (ref 22–32)
Calcium: 9.1 mg/dL (ref 8.9–10.3)
Chloride: 109 mmol/L (ref 98–111)
Creatinine, Ser: 3.16 mg/dL — ABNORMAL HIGH (ref 0.61–1.24)
GFR, Estimated: 23 mL/min — ABNORMAL LOW (ref >60)
Glucose, Bld: 131 mg/dL — ABNORMAL HIGH (ref 70–99)
Potassium: 4.4 mmol/L (ref 3.5–5.1)
Sodium: 140 mmol/L (ref 135–145)
Total Bilirubin: 0.4 mg/dL (ref 0.3–1.2)
Total Protein: 6.8 g/dL (ref 6.5–8.1)

## 2022-07-25 LAB — DIFFERENTIAL
Abs Immature Granulocytes: 0.02 10*3/uL (ref 0.00–0.07)
Basophils Absolute: 0.1 10*3/uL (ref 0.0–0.1)
Basophils Relative: 1 %
Eosinophils Absolute: 0.4 10*3/uL (ref 0.0–0.5)
Eosinophils Relative: 10 %
Immature Granulocytes: 1 %
Lymphocytes Relative: 23 %
Lymphs Abs: 1 10*3/uL (ref 0.7–4.0)
Monocytes Absolute: 0.6 10*3/uL (ref 0.1–1.0)
Monocytes Relative: 15 %
Neutro Abs: 2.2 10*3/uL (ref 1.7–7.7)
Neutrophils Relative %: 50 %

## 2022-07-25 LAB — I-STAT CHEM 8, ED
BUN: 36 mg/dL — ABNORMAL HIGH (ref 6–20)
Calcium, Ion: 1.08 mmol/L — ABNORMAL LOW (ref 1.15–1.40)
Chloride: 111 mmol/L (ref 98–111)
Creatinine, Ser: 3.6 mg/dL — ABNORMAL HIGH (ref 0.61–1.24)
Glucose, Bld: 127 mg/dL — ABNORMAL HIGH (ref 70–99)
HCT: 41 % (ref 39.0–52.0)
Hemoglobin: 13.9 g/dL (ref 13.0–17.0)
Potassium: 4.4 mmol/L (ref 3.5–5.1)
Sodium: 141 mmol/L (ref 135–145)
TCO2: 18 mmol/L — ABNORMAL LOW (ref 22–32)

## 2022-07-25 LAB — SARS CORONAVIRUS 2 BY RT PCR: SARS Coronavirus 2 by RT PCR: NEGATIVE

## 2022-07-25 LAB — APTT: aPTT: 26 seconds (ref 24–36)

## 2022-07-25 LAB — CBC
HCT: 39.4 % (ref 39.0–52.0)
Hemoglobin: 13.3 g/dL (ref 13.0–17.0)
MCH: 31.4 pg (ref 26.0–34.0)
MCHC: 33.8 g/dL (ref 30.0–36.0)
MCV: 93.1 fL (ref 80.0–100.0)
Platelets: 206 10*3/uL (ref 150–400)
RBC: 4.23 MIL/uL (ref 4.22–5.81)
RDW: 13.2 % (ref 11.5–15.5)
WBC: 4.2 10*3/uL (ref 4.0–10.5)
nRBC: 0 % (ref 0.0–0.2)

## 2022-07-25 LAB — ETHANOL: Alcohol, Ethyl (B): <10 mg/dL (ref <10)

## 2022-07-25 LAB — ECHOCARDIOGRAM COMPLETE
Area-P 1/2: 3.12 cm2
Height: 69 in
S' Lateral: 1.7 cm
Weight: 4035.3 oz

## 2022-07-25 LAB — PROTIME-INR
INR: 1 (ref 0.8–1.2)
Prothrombin Time: 12.8 seconds (ref 11.4–15.2)

## 2022-07-25 LAB — HEMOGLOBIN A1C
Hgb A1c MFr Bld: 5.6 % (ref 4.8–5.6)
Mean Plasma Glucose: 114.02 mg/dL

## 2022-07-25 LAB — CBG MONITORING, ED: Glucose-Capillary: 128 mg/dL — ABNORMAL HIGH (ref 70–99)

## 2022-07-25 LAB — HIV ANTIBODY (ROUTINE TESTING W REFLEX): HIV Screen 4th Generation wRfx: NONREACTIVE

## 2022-07-25 SURGERY — RADIOLOGY WITH ANESTHESIA
Anesthesia: General

## 2022-07-25 MED ORDER — ONDANSETRON HCL 4 MG/2ML IJ SOLN
INTRAMUSCULAR | Status: DC | PRN
  Administered 2022-07-25: 4 mg via INTRAVENOUS

## 2022-07-25 MED ORDER — PROPOFOL 10 MG/ML IV BOLUS
INTRAVENOUS | Status: DC | PRN
  Administered 2022-07-25: 140 mg via INTRAVENOUS
  Administered 2022-07-25: 50 mg via INTRAVENOUS

## 2022-07-25 MED ORDER — LABETALOL HCL 5 MG/ML IV SOLN
10.0000 mg | INTRAVENOUS | Status: DC
  Administered 2022-07-25: 10 mg via INTRAVENOUS
  Filled 2022-07-25: qty 4

## 2022-07-25 MED ORDER — ORAL CARE MOUTH RINSE
15.0000 mL | OROMUCOSAL | Status: DC | PRN
  Administered 2022-07-25 – 2022-07-26 (×2): 15 mL via OROMUCOSAL

## 2022-07-25 MED ORDER — SODIUM CHLORIDE 0.9 % IV SOLN: INTRAVENOUS | Status: DC

## 2022-07-25 MED ORDER — ACETAMINOPHEN 650 MG RE SUPP: 650.0000 mg | RECTAL | Status: DC | PRN

## 2022-07-25 MED ORDER — CHLORHEXIDINE GLUCONATE CLOTH 2 % EX PADS
6.0000 | MEDICATED_PAD | Freq: Every day | CUTANEOUS | Status: DC
  Administered 2022-07-25 – 2022-07-31 (×6): 6 via TOPICAL

## 2022-07-25 MED ORDER — SENNOSIDES-DOCUSATE SODIUM 8.6-50 MG PO TABS: 1.0000 | ORAL_TABLET | Freq: Every evening | ORAL | Status: DC | PRN

## 2022-07-25 MED ORDER — SODIUM CHLORIDE 0.9% FLUSH: 3.0000 mL | Freq: Once | INTRAVENOUS | Status: DC

## 2022-07-25 MED ORDER — LABETALOL HCL 5 MG/ML IV SOLN
INTRAVENOUS | Status: AC
  Filled 2022-07-25: qty 4

## 2022-07-25 MED ORDER — CLEVIDIPINE BUTYRATE 0.5 MG/ML IV EMUL
0.0000 mg/h | INTRAVENOUS | Status: DC
Start: 2022-07-25 — End: 2022-07-25

## 2022-07-25 MED ORDER — ASPIRIN 81 MG PO TBEC
DELAYED_RELEASE_TABLET | ORAL | Status: AC | PRN
  Administered 2022-07-25: 81 mg via ORAL

## 2022-07-25 MED ORDER — FENTANYL CITRATE PF 50 MCG/ML IJ SOSY
50.0000 ug | PREFILLED_SYRINGE | INTRAMUSCULAR | Status: DC | PRN
  Filled 2022-07-25: qty 1

## 2022-07-25 MED ORDER — ACETAMINOPHEN 160 MG/5ML PO SOLN
650.0000 mg | ORAL | Status: DC | PRN
Start: 2022-07-25 — End: 2022-07-25

## 2022-07-25 MED ORDER — CEFAZOLIN SODIUM-DEXTROSE 2-4 GM/100ML-% IV SOLN
INTRAVENOUS | Status: AC
  Filled 2022-07-25: qty 100

## 2022-07-25 MED ORDER — HYDRALAZINE HCL 20 MG/ML IJ SOLN
10.0000 mg | Freq: Four times a day (QID) | INTRAMUSCULAR | Status: DC | PRN
  Administered 2022-07-25: 20 mg via INTRAVENOUS
  Filled 2022-07-25: qty 1

## 2022-07-25 MED ORDER — LACTATED RINGERS IV SOLN: INTRAVENOUS | Status: DC | PRN

## 2022-07-25 MED ORDER — IOHEXOL 300 MG/ML  SOLN
150.0000 mL | Freq: Once | INTRAMUSCULAR | Status: AC | PRN
  Administered 2022-07-25: 80 mL via INTRA_ARTERIAL

## 2022-07-25 MED ORDER — ROCURONIUM BROMIDE 10 MG/ML (PF) SYRINGE
PREFILLED_SYRINGE | INTRAVENOUS | Status: DC | PRN
  Administered 2022-07-25: 50 mg via INTRAVENOUS
  Administered 2022-07-25: 30 mg via INTRAVENOUS
  Administered 2022-07-25: 40 mg via INTRAVENOUS
  Administered 2022-07-25: 50 mg via INTRAVENOUS

## 2022-07-25 MED ORDER — PANTOPRAZOLE SODIUM 40 MG IV SOLR
40.0000 mg | Freq: Every day | INTRAVENOUS | Status: DC
  Administered 2022-07-25: 40 mg via INTRAVENOUS
  Filled 2022-07-25: qty 10

## 2022-07-25 MED ORDER — CLEVIDIPINE BUTYRATE 0.5 MG/ML IV EMUL
0.0000 mg/h | INTRAVENOUS | Status: DC
  Administered 2022-07-25: 14 mg/h via INTRAVENOUS
  Administered 2022-07-25 (×3): 32 mg/h via INTRAVENOUS
  Administered 2022-07-25: 18 mg/h via INTRAVENOUS
  Administered 2022-07-25: 2 mg/h via INTRAVENOUS
  Administered 2022-07-25: 21 mg/h via INTRAVENOUS
  Administered 2022-07-25: 28 mg/h via INTRAVENOUS
  Administered 2022-07-26 (×5): 32 mg/h via INTRAVENOUS
  Filled 2022-07-25 (×4): qty 50
  Filled 2022-07-25: qty 100
  Filled 2022-07-25 (×2): qty 200
  Filled 2022-07-25 (×2): qty 100
  Filled 2022-07-25 (×2): qty 50

## 2022-07-25 MED ORDER — PROPOFOL 500 MG/50ML IV EMUL
INTRAVENOUS | Status: DC | PRN
  Administered 2022-07-25: 100 ug/kg/min via INTRAVENOUS

## 2022-07-25 MED ORDER — ACETAMINOPHEN 325 MG PO TABS: 650.0000 mg | ORAL_TABLET | ORAL | Status: DC | PRN

## 2022-07-25 MED ORDER — LIDOCAINE 2% (20 MG/ML) 5 ML SYRINGE
INTRAMUSCULAR | Status: DC | PRN
  Administered 2022-07-25: 100 mg via INTRAVENOUS

## 2022-07-25 MED ORDER — SUCCINYLCHOLINE CHLORIDE 200 MG/10ML IV SOSY
PREFILLED_SYRINGE | INTRAVENOUS | Status: DC | PRN
  Administered 2022-07-25: 140 mg via INTRAVENOUS

## 2022-07-25 MED ORDER — DOCUSATE SODIUM 50 MG/5ML PO LIQD: 100.0000 mg | Freq: Two times a day (BID) | ORAL | Status: DC

## 2022-07-25 MED ORDER — TICAGRELOR 90 MG PO TABS
ORAL_TABLET | ORAL | Status: AC
  Filled 2022-07-25: qty 1

## 2022-07-25 MED ORDER — LABETALOL HCL 5 MG/ML IV SOLN
10.0000 mg | INTRAVENOUS | Status: DC | PRN
  Administered 2022-07-25 (×2): 20 mg via INTRAVENOUS
  Filled 2022-07-25 (×2): qty 4

## 2022-07-25 MED ORDER — TICAGRELOR 60 MG PO TABS
ORAL_TABLET | ORAL | Status: AC | PRN
  Administered 2022-07-25: 90 mg

## 2022-07-25 MED ORDER — CEFAZOLIN SODIUM-DEXTROSE 2-3 GM-%(50ML) IV SOLR
INTRAVENOUS | Status: DC | PRN
  Administered 2022-07-25: 2 g via INTRAVENOUS

## 2022-07-25 MED ORDER — ACETAMINOPHEN 160 MG/5ML PO SOLN
650.0000 mg | ORAL | Status: DC | PRN
  Filled 2022-07-25: qty 20.3

## 2022-07-25 MED ORDER — FENTANYL CITRATE (PF) 250 MCG/5ML IJ SOLN
INTRAMUSCULAR | Status: DC | PRN
  Administered 2022-07-25 (×2): 50 ug via INTRAVENOUS

## 2022-07-25 MED ORDER — ASPIRIN 81 MG PO CHEW
81.0000 mg | CHEWABLE_TABLET | Freq: Every day | ORAL | Status: DC
  Administered 2022-07-26 – 2022-08-01 (×6): 81 mg via ORAL
  Filled 2022-07-25 (×7): qty 1

## 2022-07-25 MED ORDER — CANGRELOR TETRASODIUM 50 MG IV SOLR
INTRAVENOUS | Status: AC
  Filled 2022-07-25: qty 50

## 2022-07-25 MED ORDER — ACETAMINOPHEN 325 MG PO TABS
650.0000 mg | ORAL_TABLET | ORAL | Status: DC | PRN
  Administered 2022-07-27 – 2022-07-30 (×7): 650 mg via ORAL
  Filled 2022-07-25 (×7): qty 2

## 2022-07-25 MED ORDER — VERAPAMIL HCL 2.5 MG/ML IV SOLN
INTRAVENOUS | Status: AC
  Filled 2022-07-25: qty 2

## 2022-07-25 MED ORDER — TICAGRELOR 90 MG PO TABS
90.0000 mg | ORAL_TABLET | Freq: Two times a day (BID) | ORAL | Status: DC
  Administered 2022-07-25 – 2022-08-01 (×11): 90 mg via ORAL
  Filled 2022-07-25 (×13): qty 1

## 2022-07-25 MED ORDER — PHENYLEPHRINE HCL-NACL 20-0.9 MG/250ML-% IV SOLN
INTRAVENOUS | Status: DC | PRN
  Administered 2022-07-25: 25 ug/min via INTRAVENOUS

## 2022-07-25 MED ORDER — NITROGLYCERIN 1 MG/10 ML FOR IR/CATH LAB
INTRA_ARTERIAL | Status: AC
  Filled 2022-07-25: qty 10

## 2022-07-25 MED ORDER — GLYCOPYRROLATE PF 0.2 MG/ML IJ SOSY
PREFILLED_SYRINGE | INTRAMUSCULAR | Status: DC | PRN
  Administered 2022-07-25: .2 mg via INTRAVENOUS

## 2022-07-25 MED ORDER — DEXAMETHASONE SODIUM PHOSPHATE 10 MG/ML IJ SOLN
INTRAMUSCULAR | Status: DC | PRN
  Administered 2022-07-25: 10 mg via INTRAVENOUS

## 2022-07-25 MED ORDER — EPTIFIBATIDE 20 MG/10ML IV SOLN
INTRAVENOUS | Status: AC
  Filled 2022-07-25: qty 10

## 2022-07-25 MED ORDER — TENECTEPLASE FOR STROKE
25.0000 mg | PACK | Freq: Once | INTRAVENOUS | Status: AC
  Administered 2022-07-25: 25 mg via INTRAVENOUS
  Filled 2022-07-25: qty 10

## 2022-07-25 MED ORDER — CANGRELOR BOLUS VIA INFUSION
INTRAVENOUS | Status: AC | PRN
  Administered 2022-07-25: 858 ug via INTRAVENOUS

## 2022-07-25 MED ORDER — ENALAPRILAT 1.25 MG/ML IV SOLN
0.6250 mg | Freq: Once | INTRAVENOUS | Status: AC
  Administered 2022-07-25: 0.625 mg via INTRAVENOUS
  Filled 2022-07-25: qty 1

## 2022-07-25 MED ORDER — LABETALOL HCL 5 MG/ML IV SOLN
0.5000 mg/min | Status: DC
  Administered 2022-07-26: 3 mg/min via INTRAVENOUS
  Administered 2022-07-26: 0.5 mg/min via INTRAVENOUS
  Administered 2022-07-26: 2 mg/min via INTRAVENOUS
  Filled 2022-07-25: qty 80
  Filled 2022-07-25: qty 40
  Filled 2022-07-25 (×3): qty 80

## 2022-07-25 MED ORDER — POLYETHYLENE GLYCOL 3350 17 G PO PACK: 17.0000 g | PACK | Freq: Every day | ORAL | Status: DC

## 2022-07-25 MED ORDER — PROPOFOL 1000 MG/100ML IV EMUL
0.0000 ug/kg/min | INTRAVENOUS | Status: DC
  Administered 2022-07-25: 45 ug/kg/min via INTRAVENOUS
  Administered 2022-07-25: 50 ug/kg/min via INTRAVENOUS
  Filled 2022-07-25 (×2): qty 100

## 2022-07-25 MED ORDER — HYDRALAZINE HCL 20 MG/ML IJ SOLN
10.0000 mg | INTRAMUSCULAR | Status: DC | PRN
  Administered 2022-07-25: 20 mg via INTRAVENOUS
  Filled 2022-07-25: qty 1

## 2022-07-25 MED ORDER — HYDRALAZINE HCL 20 MG/ML IJ SOLN
10.0000 mg | INTRAMUSCULAR | Status: DC | PRN
  Administered 2022-07-25 – 2022-07-26 (×4): 20 mg via INTRAVENOUS
  Filled 2022-07-25 (×4): qty 1

## 2022-07-25 MED ORDER — TICAGRELOR 90 MG PO TABS
90.0000 mg | ORAL_TABLET | Freq: Two times a day (BID) | ORAL | Status: DC
  Administered 2022-07-26 – 2022-07-27 (×2): 90 mg
  Filled 2022-07-25 (×7): qty 1

## 2022-07-25 MED ORDER — ASPIRIN 81 MG PO CHEW
CHEWABLE_TABLET | ORAL | Status: AC
  Filled 2022-07-25: qty 1

## 2022-07-25 MED ORDER — CALCIUM GLUCONATE-NACL 1-0.675 GM/50ML-% IV SOLN
1.0000 g | Freq: Once | INTRAVENOUS | Status: AC
  Administered 2022-07-25: 1000 mg via INTRAVENOUS
  Filled 2022-07-25: qty 50

## 2022-07-25 MED ORDER — EPTIFIBATIDE 20 MG/10ML IV SOLN
INTRAVENOUS | Status: AC | PRN
  Administered 2022-07-25: 3 mg via INTRAVENOUS
  Administered 2022-07-25 (×3): 1.5 mg via INTRAVENOUS

## 2022-07-25 MED ORDER — ACETAMINOPHEN 10 MG/ML IV SOLN
1000.0000 mg | Freq: Once | INTRAVENOUS | Status: AC
  Administered 2022-07-25: 1000 mg via INTRAVENOUS
  Filled 2022-07-25: qty 100

## 2022-07-25 MED ORDER — IOHEXOL 350 MG/ML SOLN
75.0000 mL | Freq: Once | INTRAVENOUS | Status: AC | PRN
  Administered 2022-07-25: 75 mL via INTRAVENOUS

## 2022-07-25 MED ORDER — LIDOCAINE HCL 1 % IJ SOLN
INTRAMUSCULAR | Status: AC
  Filled 2022-07-25: qty 20

## 2022-07-25 MED ORDER — LABETALOL HCL 5 MG/ML IV SOLN
10.0000 mg | INTRAVENOUS | Status: DC | PRN
  Administered 2022-07-25: 10 mg via INTRAVENOUS
  Filled 2022-07-25: qty 4

## 2022-07-25 MED ORDER — ACETAMINOPHEN 650 MG RE SUPP
650.0000 mg | RECTAL | Status: DC | PRN
Start: 2022-07-25 — End: 2022-07-25

## 2022-07-25 MED ORDER — STROKE: EARLY STAGES OF RECOVERY BOOK
Freq: Once | Status: AC
  Administered 2022-07-26: 1
  Filled 2022-07-25: qty 1

## 2022-07-25 MED ORDER — ASPIRIN 81 MG PO CHEW
81.0000 mg | CHEWABLE_TABLET | Freq: Every day | ORAL | Status: DC
  Filled 2022-07-25: qty 1

## 2022-07-25 MED ORDER — SODIUM CHLORIDE (PF) 0.9 % IJ SOLN
INTRAVENOUS | Status: AC | PRN
  Administered 2022-07-25 (×2): 25 ug via INTRA_ARTERIAL

## 2022-07-25 MED ORDER — FENTANYL CITRATE PF 50 MCG/ML IJ SOSY
50.0000 ug | PREFILLED_SYRINGE | INTRAMUSCULAR | Status: DC | PRN
  Administered 2022-07-25 (×2): 100 ug via INTRAVENOUS
  Filled 2022-07-25 (×2): qty 2

## 2022-07-25 NOTE — Progress Notes (Signed)
2D echo attempted, cath lab called 1206 to do tap for another patient. Will try later

## 2022-07-25 NOTE — Progress Notes (Signed)
  Echocardiogram 2D Echocardiogram has been performed.  Janalyn Harder 07/25/2022, 3:08 PM

## 2022-07-25 NOTE — Progress Notes (Addendum)
eLink Physician-Brief Progress Note Patient Name: Joseph Bright DOB: 1976/02/02 MRN: [id number]   Date of Service  07/25/2022  HPI/Events of Note  Patient with sub-optimal blood pressure control s/p TNK. He is maxed on Cleviprex. IV function and transducer position verified.  eICU Interventions  Labetalol changed to 10 - 20 mg iv Q 2 hours PRN  and Hydralazine dosing changed to 10 - 20 mg  Q 2 hours PRN. Tylenol 1 gm iv x 1 ordered for possible headache / pain contributing to BP elevation (patient is too aphasic to reliably report pain).        Migdalia Dk 07/25/2022, 9:04 PM

## 2022-07-25 NOTE — Progress Notes (Signed)
SLP Cancellation Note  Patient Details Name: Joseph Bright MRN: [id number] DOB: 13-Oct-1975   Cancelled treatment:        Attempted to see pt for cognitive linguistic evaluation.  Pt is requiring ventilator support at this time.  SLP will reattempt when pt is able to participate in assessment.  Consider orders for swallowing post-extubation, if indicated.   Kerrie Pleasure, MA, CCC-SLP Acute Rehabilitation Services Office: [phone] 07/25/2022, 10:58 AM

## 2022-07-25 NOTE — Anesthesia Procedure Notes (Signed)
Procedure Name: Intubation Date/Time: 07/25/2022 7:50 AM  Performed by: Alvera Novel, CRNAPre-anesthesia Checklist: Patient identified, Emergency Drugs available, Suction available and Patient being monitored Patient Re-evaluated:Patient Re-evaluated prior to induction Oxygen Delivery Method: Circle System Utilized Preoxygenation: Pre-oxygenation with 100% oxygen Induction Type: Rapid sequence and IV induction Laryngoscope Size: Mac and 4 Grade View: Grade II Tube type: Oral Tube size: 7.0 mm Number of attempts: 1 Airway Equipment and Method: Stylet and Oral airway Placement Confirmation: ETT inserted through vocal cords under direct vision, positive ETCO2 and breath sounds checked- equal and bilateral Secured at: 22 cm Tube secured with: Tape Dental Injury: Teeth and Oropharynx as per pre-operative assessment  Comments: Large amount of excess periorbital tissue. Blood in back of throat prior to intubation.

## 2022-07-25 NOTE — Sedation Documentation (Signed)
Pt transferred to 4N25 with CRNA, intubated. Pulses and dressing checked.

## 2022-07-25 NOTE — Anesthesia Procedure Notes (Signed)
Arterial Line Insertion Start/End4/30/2024 7:50 AM, 07/25/2022 7:55 AM Performed by: Alvera Novel, CRNA  Patient location: Pre-op. Preanesthetic checklist: patient identified, IV checked, site marked, risks and benefits discussed, surgical consent, monitors and equipment checked, pre-op evaluation, timeout performed and anesthesia consent Lidocaine 1% used for infiltration Left, radial was placed Catheter size: 20 G Hand hygiene performed  and maximum sterile barriers used   Attempts: 1 Procedure performed without using ultrasound guided technique. Following insertion, dressing applied and Biopatch. Post procedure assessment: normal and unchanged  Patient tolerated the procedure well with no immediate complications.

## 2022-07-25 NOTE — Anesthesia Postprocedure Evaluation (Signed)
Anesthesia Post Note  Patient: Joseph Bright  Procedure(s) Performed: RADIOLOGY WITH ANESTHESIA     Patient location during evaluation: PACU Anesthesia Type: General Level of consciousness: awake and alert Pain management: pain level controlled Vital Signs Assessment: post-procedure vital signs reviewed and stable Respiratory status: spontaneous breathing, nonlabored ventilation and respiratory function stable Cardiovascular status: blood pressure returned to baseline and stable Postop Assessment: no apparent nausea or vomiting Anesthetic complications: no   No notable events documented.  Last Vitals:  Vitals:   07/25/22 1230 07/25/22 1245  BP: 113/73 113/73  Pulse: 90 85  Resp: 18 (!) 29  SpO2: 99% 98%    Last Pain: There were no vitals filed for this visit.               Lowella Curb

## 2022-07-25 NOTE — ED Notes (Signed)
0725- TNK given  0736- Pt transported to IR

## 2022-07-25 NOTE — Progress Notes (Signed)
PT Cancellation Note  Patient Details Name: Joseph Bright MRN: [id number] DOB: [birth year]   Cancelled Treatment:    Reason Eval/Treat Not Completed: Active bedrest order. Per chart pt received TNK at 739 AM, no on bedrest. Please reach out to PT if pt is removed from bedrest today and appropriate for evaluation.    Arlyss Gandy 07/25/2022, 7:54 AM

## 2022-07-25 NOTE — Progress Notes (Signed)
RT received Pt from IR into 4N25 and placed Pt on ventilator.

## 2022-07-25 NOTE — Procedures (Signed)
INR.  Status post left common carotid arteriogram.  Right CFA approach.  Findings.  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  Status post complete revascularization of occluded left anterior cerebral A2 segment  with 1 pass with 054 contact aspiration achieving a TICI  3 revascularization.  Status post rescue stenting due to reocclusion caused by underlying intracranial arteriosclerosis.  Post CT x 2 no evidence of intracranial hemorrhage.  8 French Angio-Seal closure device deployed for hemostasis at the right groin puncture site.  Distal pulses all dopplerable unchanged bilaterally.  Patient left intubated to protect airway due to bleeding during intubation.  Meds.  Aspirin 81 mg and brilinta 90 mg via OG tube . 7.5 mg of IA integrelin and 7.5 mg / kg of cangrelor  IV bolus.  S.Anecia Nusbaum MD

## 2022-07-25 NOTE — Code Documentation (Signed)
Stroke Response Nurse Documentation Code Documentation  Vishnu Moeller is a 47 y.o. male arriving to [facility]  via [facility] on 07/25/2022 with past medical hx of HTN, CKD, OSA, chronic back pain. On No antithrombotic. Code stroke was activated by EMS.   Patient from home where he was LKW at 0445 and now complaining of aphasia and right sided weakness . Per EMS, wife told them patient woke up at 0445 complaining of right sided cramping. She went to the store to get something and when she got back around 0605 his was unable to talk and move his right side. EMS was called   Stroke team at the bedside on patient arrival. Labs drawn and patient cleared for CT by EDP. Patient to CT with team. NIHSS 18, see documentation for details and code stroke times. Patient with disoriented, right hemianopia, right facial droop, right arm weakness, right leg weakness, right decreased sensation, Global aphasia , and dysarthria  on exam. The following imaging was completed:  CT Head and CTA. Patient is a candidate for IV Thrombolytic due to meets criteria. Patient is a candidate for IR per MD .    Bedside handoff with IR RN.  Felecia Jan  Stroke Response RN

## 2022-07-25 NOTE — ED Provider Notes (Signed)
EMERGENCY DEPARTMENT AT [facility] Provider Note   CSN: [id number] Arrival date & time: 07/25/22  4098  An emergency department physician performed an initial assessment on this suspected stroke patient at 0715.  History  Chief Complaint  Patient presents with   Code Stroke    Joseph Bright is a 47 y.o. male.  HPI Patient presented as a code stroke.  Last normal at 445.  At 605 he said flaccid on the right side and aphasic.  Patient cannot really provide history due to dense aphasia.  Met by myself and neurology upon arrival.   Reported history of hypertension.  Reportedly has not had his medicines filled in 6 months.  Home Medications Prior to Admission medications   Medication Sig Start Date End Date Taking? Authorizing Provider  amLODipine (NORVASC) 10 MG tablet Take 10 mg by mouth daily.    [provider]  baclofen (LIORESAL) 10 MG tablet Take 1 tablet (10 mg total) by mouth at bedtime as needed for muscle spasms. 04/16/18   Hilts, Casimiro Needle, MD  hydrALAZINE (APRESOLINE) 50 MG tablet Take 50 mg by mouth 2 (two) times daily.    [provider]  isosorbide mononitrate (IMDUR) 60 MG 24 hr tablet Take 60 mg by mouth daily.    [provider]  labetalol (NORMODYNE) 200 MG tablet Take 200 mg by mouth 2 (two) times daily.    [provider]  olmesartan (BENICAR) 5 MG tablet Take 10 mg by mouth daily. 05/24/22   [provider]  Olmesartan-amLODIPine-HCTZ 40-10-25 MG TABS Take 1 tablet by mouth daily. 07/07/22   [provider]  olmesartan-hydrochlorothiazide (BENICAR HCT) 20-12.5 MG tablet Take 1 tablet by mouth daily. 06/27/22   [provider]  simvastatin (ZOCOR) 20 MG tablet Take 20 mg by mouth daily.    [provider]  traMADol (ULTRAM) 50 MG tablet TAKE 1 TO 2 TABLETS(50 TO 100 MG) BY MOUTH AT BEDTIME AS NEEDED Patient taking differently: Take 50-100 mg by mouth at bedtime as needed for  moderate pain or severe pain. 06/25/18   Hilts, Casimiro Needle, MD      Allergies    Patient has no known allergies.    Review of Systems   Review of Systems  Physical Exam Updated Vital Signs BP (!) 137/107   Wt 114.4 kg  Physical Exam Vitals and nursing note reviewed.  Cardiovascular:     Rate and Rhythm: Regular rhythm.  Pulmonary:     Effort: No respiratory distress.  Neurological:     Comments: Awake.  Nonverbal.  Aphasic.  Flaccid or weak on the right.  Eye movements intact.     ED Results / Procedures / Treatments   Labs (all labs ordered are listed, but only abnormal results are displayed) Labs Reviewed  COMPREHENSIVE METABOLIC PANEL - Abnormal; Notable for the following components:      Result Value   CO2 18 (*)    Glucose, Bld 131 (*)    BUN 36 (*)    Creatinine, Ser 3.16 (*)    GFR, Estimated 23 (*)    All other components within normal limits  I-STAT CHEM 8, ED - Abnormal; Notable for the following components:   BUN 36 (*)    Creatinine, Ser 3.60 (*)    Glucose, Bld 127 (*)    Calcium, Ion 1.08 (*)    TCO2 18 (*)    All other components within normal limits  CBG MONITORING, ED - Abnormal; Notable for  the following components:   Glucose-Capillary 128 (*)    All other components within normal limits  SARS CORONAVIRUS 2 BY RT PCR  PROTIME-INR  APTT  CBC  DIFFERENTIAL  ETHANOL  HIV ANTIBODY (ROUTINE TESTING W REFLEX)  HEMOGLOBIN A1C    EKG None  Radiology CT ANGIO HEAD NECK W WO CM (CODE STROKE)  Result Date: 07/25/2022 CLINICAL DATA:  Code stroke. 47 year old male. Aphasia, right side weakness. EXAM: CT ANGIOGRAPHY HEAD AND NECK WITH AND WITHOUT CONTRAST TECHNIQUE: Multidetector CT imaging of the head and neck was performed using the standard protocol during bolus administration of intravenous contrast. Multiplanar CT image reconstructions and MIPs were obtained to evaluate the vascular anatomy. Carotid stenosis measurements (when applicable) are  obtained utilizing NASCET criteria, using the distal internal carotid diameter as the denominator. RADIATION DOSE REDUCTION: This exam was performed according to the departmental dose-optimization program which includes automated exposure control, adjustment of the mA and/or kV according to patient size and/or use of iterative reconstruction technique. CONTRAST:  75mL OMNIPAQUE IOHEXOL 350 MG/ML SOLN COMPARISON:  Plain head CT 0720 hours today. FINDINGS: CTA NECK Skeleton: No acute osseous abnormality identified. Cervical spine degeneration chiefly C5-C6. Upper chest: Negative. Other neck: Negative. Aortic arch: Mildly tortuous arch. Bovine arch configuration. No arch atherosclerosis. Right carotid system: Negative. Left carotid system: Bovine left CCA origin without plaque or stenosis. No cervical left carotid plaque or stenosis. Vertebral arteries: Negative, and fairly codominant cervical vertebral arteries. CTA HEAD Posterior circulation: Mild V4 segment calcified plaque on the left. Codominant distal vertebral arteries and vertebrobasilar junction are patent without stenosis. Normal left PICA origin. Right AICA appears dominant. Patent basilar artery without stenosis. Basilar tip, SCA and PCA origins appear normal. Posterior communicating arteries are diminutive or absent. Right PCA branches are within normal limits. There is moderate left P2 segment irregularity and stenosis on series 12, image 23 distal left PCA branches remain patent. Anterior circulation: Patent ICA siphons with no plaque or stenosis. Patent carotid termini, MCA and ACA origins. Bilateral MCA branches are patent and within normal limits. ACA A1 segments and small (fenestrated) anterior communicating arteries are within normal limits. The left ACA A2 segment is occluded on series 7, image 97 about 13 mm from its origin. See also series 12, image 19. No distal reconstitution identified. Right ACA branches remain patent. There is a mild distal  right ACA (84) stenosis also on image 19. Venous sinuses: Early contrast timing, not evaluated. Anatomic variants: None significant. Review of the MIP images confirms the above findings Left ACA finding discussed by telephone with Dr. Wilford Corner on 07/25/2022 at 07:32 . IMPRESSION: 1. 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 No distal reconstitution. No other ELVO. This was discussed by telephone with Dr. Wilford Corner on 07/25/2022 at 07:32. 2. Very little atherosclerosis identified in the head or neck. But there is Moderate stenosis of the Left PCA P2 segment. And mild right ACA A4 stenosis. Electronically Signed   By: Odessa Fleming M.D.   On: 07/25/2022 07:42   CT HEAD CODE STROKE WO CONTRAST  Result Date: 07/25/2022 CLINICAL DATA:  Code stroke. 47 year old male. Aphasia, right side weakness. EXAM: CT HEAD WITHOUT CONTRAST TECHNIQUE: Contiguous axial images were obtained from the base of the skull through the vertex without intravenous contrast. RADIATION DOSE REDUCTION: This exam was performed according to the departmental dose-optimization program which includes automated exposure control, adjustment of the mA and/or kV according to patient size  and/or use of iterative reconstruction technique. COMPARISON:  Head CT 11/19/2006. FINDINGS: Brain: New since 2008 but chronic appearing, circumscribed lacunar infarct lateral right thalamus series 2, image 18. Increased mild additional patchy white matter opacity such as in the left hemisphere series 2, image 24. No midline shift, ventriculomegaly, mass effect, evidence of mass lesion, intracranial hemorrhage or evidence of cortically based acute infarction. Chronic partially empty sella. Vascular: Mild Calcified atherosclerosis at the skull base. Skull: No acute osseous abnormality identified. Sinuses/Orbits: Right maxillary molar dental disease with periapical cyst extending into the alveolar recess of that sinus. Mild bilateral maxillary  mucosal thickening. Other visualized paranasal sinuses and mastoids are stable and well aerated. Other: Visualized orbits and scalp soft tissues are within normal limits. ASPECTS [facility] Stroke Program Early CT Score) Total score (0-10 with 10 being normal): 10 IMPRESSION: 1. No acute cortically based infarct or acute intracranial hemorrhage identified. ASPECTS 10. 2. New but chronic appearing right thalamic lacune since 2008. Some increased white matter disease since that time. 3. These results were communicated to Dr. Wilford Corner at 7:27 am on 07/25/2022 by text page via the [facility] messaging system. Electronically Signed   By: Odessa Fleming M.D.   On: 07/25/2022 07:28    Procedures Procedures    Medications Ordered in ED Medications  sodium chloride flush (NS) 0.9 % injection 3 mL (has no administration in time range)   stroke: early stages of recovery book (has no administration in time range)  0.9 %  sodium chloride infusion (has no administration in time range)  acetaminophen (TYLENOL) tablet 650 mg (has no administration in time range)    Or  acetaminophen (TYLENOL) 160 MG/5ML solution 650 mg (has no administration in time range)    Or  acetaminophen (TYLENOL) suppository 650 mg (has no administration in time range)  senna-docusate (Senokot-S) tablet 1 tablet (has no administration in time range)  pantoprazole (PROTONIX) injection 40 mg (has no administration in time range)  iohexol (OMNIPAQUE) 300 MG/ML solution 150 mL (has no administration in time range)  nitroGLYCERIN 25 mcg in sodium chloride (PF) 0.9 % 59.71 mL (25 mcg/mL) syringe (25 mcg Intra-arterial Given 07/25/22 0837)  tenecteplase (TNKASE) injection for Stroke 25 mg (25 mg Intravenous Given 07/25/22 0739)  iohexol (OMNIPAQUE) 350 MG/ML injection 75 mL (75 mLs Intravenous Contrast Given 07/25/22 0729)    ED Course/ Medical Decision Making/ A&P                             Medical Decision Making Amount and/or Complexity of Data  Reviewed Labs: ordered. Radiology: ordered.  Risk Decision regarding hospitalization.   Patient presented as a code stroke.  Dense deficits that would be debilitating.  He is in the window for TNK.  Initial head CT independently interpreted and reviewed with neurology and no bleed.  TNK given.  CTA done due to severe deficits.  Creatinine elevated but worth the risk for potential benefit.  Does show occlusion and will be taken to neurointerventional radiology.  Reviewed previous notes that are in epic.  CRITICAL CARE Performed by: Benjiman Core Total critical care time: 30 minutes Critical care time was exclusive of separately billable procedures and treating other patients. Critical care was necessary to treat or prevent imminent or life-threatening deterioration. Critical care was time spent personally by me on the following activities: development of treatment plan with patient and/or surrogate as well as nursing, discussions with consultants, evaluation of patient's response  to treatment, examination of patient, obtaining history from patient or surrogate, ordering and performing treatments and interventions, ordering and review of laboratory studies, ordering and review of radiographic studies, pulse oximetry and re-evaluation of patient's condition.         Final Clinical Impression(s) / ED Diagnoses Final diagnoses:  Cerebrovascular accident (CVA), unspecified mechanism [facility])    Rx / DC Orders ED Discharge Orders     None         Benjiman Core, MD 07/25/22 [phone]

## 2022-07-25 NOTE — Procedures (Signed)
Extubation Procedure Note  Patient Details:   Name: Joseph Bright DOB: 1975-05-16 MRN: [id number]   Airway Documentation:    Vent end date: 07/25/22 Vent end time: 1644   Evaluation  O2 sats: stable throughout Complications: No apparent complications Patient did tolerate procedure well. Bilateral Breath Sounds: Diminished   No  Pt extubated per order to 4L [place]. Pt had a positive cuff leak, able to state some words, and no stridor was noted. RT will monitor as needed.   Kerri Perches 07/25/2022, 4:45 PM

## 2022-07-25 NOTE — ED Triage Notes (Signed)
Pt BIB EMS as code stroke. Pt LSN at 0445, complaining to wife about right side cramping.. Wife went to go get medicine and came back at 0605, pt was flaccid on right side and aphasic. Hx of stroke.

## 2022-07-25 NOTE — Progress Notes (Signed)
PHARMACIST CODE STROKE RESPONSE  Notified to mix TNK at 0723 by Dr. Wilford Corner. TNK preparation completed at 0725  TNK dose = 25 mg IV over 5 seconds  Issues/delays encountered (if applicable): N/A  Vernard Gambles, PharmD, BCPS  07/25/22 7:26 AM

## 2022-07-25 NOTE — Consult Note (Addendum)
NAME:  Joseph Bright, MRN:  [id number], DOB:  [birth year], LOS: 0 ADMISSION DATE:  07/25/2022, CONSULTATION DATE: 07/25/2022 REFERRING MD:  Dr. Wilford Corner, CHIEF COMPLAINT: Acute right-sided weakness  History of Present Illness:  47 year old male with hypertension, noncompliance with medications who was brought into the emergency department with sudden onset of aphasia and right-sided weakness, stroke code was called, noted to have L ACA occlusion, received TNK and underwent mechanical thrombectomy with TICI 3 result, with reocclusion resulting in stent placement.  Patient remained intubated postprocedure, PCCM was consulted for help evaluation medical management  Pertinent  Medical History   Past Medical History:  Diagnosis Date   Hypertension    Stroke (HCC)      Significant Hospital Events: Including procedures, antibiotic start and stop dates in addition to other pertinent events     Interim History / Subjective:  Consulted  Objective   Blood pressure 113/74, pulse 79, resp. rate 17, height 5\' 9"  (1.753 m), weight 114.4 kg, SpO2 100 %.    Vent Mode: PRVC FiO2 (%):  [60 %-100 %] 60 % Set Rate:  [14 bmp] 14 bmp Vt Set:  [560 mL] 560 mL PEEP:  [5 cmH20] 5 cmH20 Plateau Pressure:  [15 cmH20] 15 cmH20   Intake/Output Summary (Last 24 hours) at 07/25/2022 1135 Last data filed at 07/25/2022 1100 Gross per 24 hour  Intake 1023.17 ml  Output 50 ml  Net 973.17 ml   Filed Weights   07/25/22 0700  Weight: 114.4 kg    Examination:   Physical exam: General: Crtitically ill-appearing obese male, orally intubated HEENT: [place]/AT, eyes anicteric.  ETT and OGT in place Neuro: Sedated, paralyzed, not following commands.  Eyes are closed.  Pupils 3 mm bilateral reactive to light Chest: Rhonchorous breath sounds on right side, clear to auscultation on left side, no wheezes Heart: Regular rate and rhythm, no murmurs or gallops Abdomen: Soft, nondistended, bowel sounds present Skin: No  rash  Labs and images were reviewed  Resolved Hospital Problem list     Assessment & Plan:  Acute left ACA territory stroke s/p TNK and mechanical thrombectomy Continue neuro watch every hour Stroke team is following Continue secondary stroke prophylaxis Echocardiogram and MRI brain pending Hold antiplatelet and anticoagulation in the setting of TNK for 24 hours  Poorly controlled hypertension, noncompliant with treatment Continue clevidipine with SBP goal 120-140 Once extubated resume oral antihypertensive meds Medication counseling as appropriate  Acute respiratory insufficiency, postprocedure Continue lung protective ventilation VAP prevention bundle in place PAD protocol with propofol and fentanyl  CKD stage IV, 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 Diet and exercise counseling when appropriate  Best Practice (right click and "Reselect all SmartList Selections" daily)   Diet/type: NPO DVT prophylaxis: SCD GI prophylaxis: PPI Lines: N/A Foley:  Yes, and it is still needed Code Status:  full code Last date of multidisciplinary goals of care discussion [pending]  Labs   CBC: Recent Labs  Lab 07/25/22 0718 07/25/22 0723  WBC 4.2  --   NEUTROABS 2.2  --   HGB 13.3 13.9  HCT 39.4 41.0  MCV 93.1  --   PLT 206  --     Basic Metabolic Panel: Recent Labs  Lab 07/25/22 0718 07/25/22 0723  NA 140 141  K 4.4 4.4  CL 109 111  CO2 18*  --   GLUCOSE 131* 127*  BUN 36* 36*  CREATININE 3.16*  3.60*  CALCIUM 9.1  --    GFR: Estimated Creatinine Clearance: 31.6 mL/min (A) (by C-G formula based on SCr of 3.6 mg/dL (H)). Recent Labs  Lab 07/25/22 0718  WBC 4.2    Liver Function Tests: Recent Labs  Lab 07/25/22 0718  AST 28  ALT 29  ALKPHOS 66  BILITOT 0.4  PROT 6.8  ALBUMIN 3.8   No results for input(s): "LIPASE", "AMYLASE" in the last  168 hours. No results for input(s): "AMMONIA" in the last 168 hours.  ABG    Component Value Date/Time   HCO3 27.0 (H) 11/19/2006 1413   TCO2 18 (L) 07/25/2022 0723     Coagulation Profile: Recent Labs  Lab 07/25/22 0718  INR 1.0    Cardiac Enzymes: No results for input(s): "CKTOTAL", "CKMB", "CKMBINDEX", "TROPONINI" in the last 168 hours.  HbA1C: No results found for: "HGBA1C"  CBG: Recent Labs  Lab 07/25/22 0715  GLUCAP 128*    Review of Systems:   Unable to obtain as patient is intubated and sedated  Past Medical History:  He,  has a past medical history of Hypertension and Stroke (HCC).   Surgical History:  History reviewed. No pertinent surgical history.   Social History:   reports that he quit smoking about 22 years ago. His smoking use included cigarettes. He started smoking about 27 years ago. He has never used smokeless tobacco. He reports current alcohol use. He reports that he does not use drugs.   Family History:  His family history is not on file.   Allergies No Known Allergies   Home Medications  Prior to Admission medications   Medication Sig Start Date End Date Taking? Authorizing Provider  amLODipine (NORVASC) 10 MG tablet Take 10 mg by mouth daily.    [provider]  baclofen (LIORESAL) 10 MG tablet Take 1 tablet (10 mg total) by mouth at bedtime as needed for muscle spasms. 04/16/18   Hilts, Casimiro Needle, MD  hydrALAZINE (APRESOLINE) 50 MG tablet Take 50 mg by mouth 2 (two) times daily.    [provider]  isosorbide mononitrate (IMDUR) 60 MG 24 hr tablet Take 60 mg by mouth daily.    [provider]  labetalol (NORMODYNE) 200 MG tablet Take 200 mg by mouth 2 (two) times daily.    [provider]  olmesartan (BENICAR) 5 MG tablet Take 10 mg by mouth daily. 05/24/22   [provider]  Olmesartan-amLODIPine-HCTZ 40-10-25 MG TABS Take 1 tablet by mouth daily. 07/07/22   [provider]   olmesartan-hydrochlorothiazide (BENICAR HCT) 20-12.5 MG tablet Take 1 tablet by mouth daily. 06/27/22   [provider]  simvastatin (ZOCOR) 20 MG tablet Take 20 mg by mouth daily.    [provider]  traMADol (ULTRAM) 50 MG tablet TAKE 1 TO 2 TABLETS(50 TO 100 MG) BY MOUTH AT BEDTIME AS NEEDED Patient taking differently: Take 50-100 mg by mouth at bedtime as needed for moderate pain or severe pain. 06/25/18   Hilts, Casimiro Needle, MD     Critical care time:     This patient is critically ill with multiple organ system failure which requires frequent high complexity decision making, assessment, support, evaluation, and titration of therapies. This was completed through the application of advanced monitoring technologies and extensive interpretation of multiple databases.  During this encounter critical care time was devoted to patient care services described in this note for 42 minutes.    Cheri Fowler, MD [facility] See Amion for  pager If no response to pager, please call [phone] until 7pm After 7pm, Please call E-link [phone]

## 2022-07-25 NOTE — Transfer of Care (Signed)
Immediate Anesthesia Transfer of Care Note  Patient: Joseph Bright  Procedure(s) Performed: RADIOLOGY WITH ANESTHESIA  Patient Location: PACU  Anesthesia Type:General  Level of Consciousness: Patient remains intubated per anesthesia plan  Airway & Oxygen Therapy: Patient remains intubated per anesthesia plan and Patient placed on Ventilator (see vital sign flow sheet for setting)  Post-op Assessment: Report given to RN and Post -op Vital signs reviewed and stable  Post vital signs: Reviewed and stable  Last Vitals:  Vitals Value Taken Time  BP 114/77 07/25/22 1052  Temp    Pulse 68 07/25/22 1103  Resp 14 07/25/22 1103  SpO2 100 % 07/25/22 1103  Vitals shown include unvalidated device data.  Last Pain: There were no vitals filed for this visit.       Complications: No notable events documented.

## 2022-07-25 NOTE — Progress Notes (Signed)
eLink Physician-Brief Progress Note Patient Name: Joseph Bright DOB: [birth year] MRN: [id number]   Date of Service  07/25/2022  HPI/Events of Note  Patient blood pressure persistently greater than neuro parameters of SBP 120 - 140 mmHg despite frequent dosing of PRN Hydralazine  / PRN Labetalol.  eICU Interventions  Labetalol gtt started.        Migdalia Dk 07/25/2022, 11:57 PM

## 2022-07-25 NOTE — H&P (Signed)
Stroke Neurology Admission History & Physical   CC: Acute onset right-sided weakness  History is obtained from: EMS, chart, patient's wife over the phone  HPI: Joseph Bright is a 47 y.o. male past medical history of hypertension, back pain with presumable noncompliance to medications because of no medication fill history for last 6 months, brought in by EMS for concerns for sudden onset of aphasia and right-sided weakness. He woke up at 4:45 AM with some cramping in his legs.  His wife went to the store to get some medicines and when she came at around 6:00, she noted him to be unable to talk and move the right side.  EMS was called.  Code stroke was activated.  He was evaluated emergently on the bridge. Symptoms consistent with a left hemispheric stroke. CT head did not reveal a bleed.  Wife was contacted over the phone.  Risk benefits alternatives of TNK discussed and TNK was given, after she agreed. CT angiography head and neck was done as we were speaking with the wife to obtain consent.  It showed a left ACA occlusion.  Case was discussed with neuroendovascular radiology and code IR was activated and he was taken to the IR suite.   LKW: 4:45 AM IV thrombolysis given?:  Yes EVT: Yes Premorbid modified Rankin scale (mRS):0   ROS: Full ROS was performed and is negative except as noted in the HPI.   No past medical history on file.   No family history on file.   Social History:   reports that he quit smoking about 22 years ago. His smoking use included cigarettes. He started smoking about 27 years ago. He has never used smokeless tobacco. He reports current alcohol use. He reports that he does not use drugs.  Medications  Current Facility-Administered Medications:    [START ON 07/26/2022]  stroke: early stages of recovery book, , Does not apply, Once, Milon Dikes, MD   0.9 %  sodium chloride infusion, , Intravenous, Continuous, Milon Dikes, MD   acetaminophen (TYLENOL) tablet  650 mg, 650 mg, Oral, Q4H PRN **OR** acetaminophen (TYLENOL) 160 MG/5ML solution 650 mg, 650 mg, Per Tube, Q4H PRN **OR** acetaminophen (TYLENOL) suppository 650 mg, 650 mg, Rectal, Q4H PRN, Milon Dikes, MD   pantoprazole (PROTONIX) injection 40 mg, 40 mg, Intravenous, QHS, Milon Dikes, MD   senna-docusate (Senokot-S) tablet 1 tablet, 1 tablet, Oral, QHS PRN, Milon Dikes, MD   sodium chloride flush (NS) 0.9 % injection 3 mL, 3 mL, Intravenous, Once, Benjiman Core, MD  Current Outpatient Medications:    amLODipine (NORVASC) 10 MG tablet, Take 10 mg by mouth daily., Disp: , Rfl:    baclofen (LIORESAL) 10 MG tablet, Take 1 tablet (10 mg total) by mouth at bedtime as needed for muscle spasms., Disp: 30 each, Rfl: 3   hydrALAZINE (APRESOLINE) 50 MG tablet, Take 50 mg by mouth 2 (two) times daily., Disp: , Rfl:    isosorbide mononitrate (IMDUR) 60 MG 24 hr tablet, Take 60 mg by mouth daily., Disp: , Rfl:    labetalol (NORMODYNE) 200 MG tablet, Take 200 mg by mouth 2 (two) times daily., Disp: , Rfl:    olmesartan (BENICAR) 5 MG tablet, Take 10 mg by mouth daily., Disp: , Rfl:    Olmesartan-amLODIPine-HCTZ 40-10-25 MG TABS, Take 1 tablet by mouth daily., Disp: , Rfl:    olmesartan-hydrochlorothiazide (BENICAR HCT) 20-12.5 MG tablet, Take 1 tablet by mouth daily., Disp: , Rfl:    simvastatin (ZOCOR) 20 MG tablet, Take  20 mg by mouth daily., Disp: , Rfl:    traMADol (ULTRAM) 50 MG tablet, TAKE 1 TO 2 TABLETS(50 TO 100 MG) BY MOUTH AT BEDTIME AS NEEDED (Patient taking differently: Take 50-100 mg by mouth at bedtime as needed for moderate pain or severe pain.), Disp: 20 tablet, Rfl: 0  Facility-Administered Medications Ordered in Other Encounters:    fentaNYL citrate (PF) (SUBLIMAZE) injection, , Intravenous, Anesthesia Intra-op, Oswaldo Milian H, CRNA, 50 mcg at 07/25/22 0748   propofol (DIPRIVAN) 10 mg/mL bolus/IV push, , Intravenous, Anesthesia Intra-op, Oswaldo Milian H, CRNA, 140 mg at 07/25/22  0748   rocuronium bromide 10 mg/mL (PF) syringe, , Intravenous, Anesthesia Intra-op, Oswaldo Milian H, CRNA, 50 mg at 07/25/22 4098   succinylcholine (ANECTINE) syringe, , Intravenous, Anesthesia Intra-op, Oswaldo Milian H, CRNA, 140 mg at 07/25/22 0749   Exam: Current vital signs: BP (!) 137/107   Wt 114.4 kg  Vital signs in last 24 hours: BP: (136-146)/(92-107) 137/107 (04/30 0740) Weight:  [114.4 kg] 114.4 kg (04/30 0700)  GENERAL: Awake, alert in NAD HEENT: - Normocephalic and atraumatic, dry mm, no LN++, no Thyromegally LUNGS - Clear to auscultation bilaterally with no wheezes CV - S1S2 RRR, no m/r/g, equal pulses bilaterally. ABDOMEN - Soft, nontender, nondistended with normoactive BS Ext: warm, well perfused, intact peripheral pulses, __ edema Neurological exam He is awake alert.  Appears to mumble in response to questions but is completely incomprehensible.  Is able to mimic but not able to follow verbal commands. Cranial nerves: Pupils equal round react light, extraocular movements appear intact, does not blink to threat consistently from either side, there is right lower facial weakness at rest and on smiling. Motor examination with flaccid right lower extremity and right upper extremity.  Left side is full strength and antigravity without drift. Sensation diminished on the right side as evidenced by grimace that is stronger on the left compared to the right to noxious stimulation. Coordination difficult to assess but no gross dysmetria on the left.  Unable to perform in the right.  NIHSS 1a Level of Conscious.: 0 1b LOC Questions: 2 1c LOC Commands: 0 2 Best Gaze: 0 3 Visual: 0 4 Facial Palsy: 2 5a Motor Arm - left: 0 5b Motor Arm - Right: 4 6a Motor Leg - Left: 0 6b Motor Leg - Right: 4 7 Limb Ataxia: 0 8 Sensory: 1 9 Best Language: 3 10 Dysarthria: 2 11 Extinct. and Inatten.: 0 TOTAL: 18   Labs I have reviewed labs in epic and the results pertinent to this  consultation are:  CBC    Component Value Date/Time   WBC 4.2 07/25/2022 0718   RBC 4.23 07/25/2022 0718   HGB 13.9 07/25/2022 0723   HCT 41.0 07/25/2022 0723   PLT 206 07/25/2022 0718   MCV 93.1 07/25/2022 0718   MCH 31.4 07/25/2022 0718   MCHC 33.8 07/25/2022 0718   RDW 13.2 07/25/2022 0718   LYMPHSABS 1.0 07/25/2022 0718   MONOABS 0.6 07/25/2022 0718   EOSABS 0.4 07/25/2022 0718   BASOSABS 0.1 07/25/2022 0718    CMP     Component Value Date/Time   NA 141 07/25/2022 0723   K 4.4 07/25/2022 0723   CL 111 07/25/2022 0723   CO2 28 11/23/2006 0522   GLUCOSE 127 (H) 07/25/2022 0723   BUN 36 (H) 07/25/2022 0723   CREATININE 3.60 (H) 07/25/2022 0723   CALCIUM 9.7 11/23/2006 0522   CALCIUM 8.9 11/20/2006 1735   PROT 6.5 11/23/2006 0522  ALBUMIN 3.7 11/23/2006 0522   AST 25 11/23/2006 0522   ALT 26 11/23/2006 0522   ALKPHOS 81 11/23/2006 0522   BILITOT 0.9 11/23/2006 0522   GFRNONAA 16 (L) 11/23/2006 0522   GFRAA (L) 11/23/2006 0522    19        The eGFR has been calculated using the MDRD equation. This calculation has not been validated in all clinical   Imaging I have reviewed the images obtained:  CT-head-aspects 10.  No bleed. CT angiography head and neck: Positive for acute left A2 occlusion.  No other ELVO.  Left PCA P2 segment shows moderate stenosis.  Assessment:  47 year old with sudden onset of aphasia and right-sided weakness, on examination has flaccid hemiplegia on the right along with expressive aphasia and some component of receptive aphasia as well although he is able to mimic actions. NIH stroke scale 18. Noncontrast head CT with no evidence of bleed.  Aspects 10. Risk benefits alternatives of TNKase and thrombectomy discussed with wife.  Consent obtained over the phone given the emergent nature of the case. She agreed to proceed with both IV thrombolysis and endovascular thrombectomy.  Plan:  Acute Ischemic Stroke Cerebral infarction due to  embolism of left anterior cerebral artery  Acuity: Acute Current Suspected Etiology: Under investigation Continue Evaluation:  -Admit to: Neurological ICU  -Hold Aspirin until 24 hour post IV thrombolysis (tPA or TNKase) neuroimaging is stable and without evidence of bleeding -Blood pressure control-goals to be determined based on revascularization.  If successful revascularization, goals per IR.  If unsuccessful revascularization, use post TNK goal of systolic less than 180. -MRI/ECHO/A1C/Lipid panel. -Hyperglycemia management per SSI to maintain glucose 140-180mg /dL. -PT/OT/ST therapies and recommendations when able  CNS -Close neuro monitoring  Dysarthria Dysphagia following cerebral infarction  -NPO until cleared by speech or bedside swallow -ST -Advance diet as tolerated  Hemiplegia and hemiparesis following cerebral infarction affecting right dominant side -PT/OT -PM&R consult  Uremic encephalopathy -Correct metabolic causes -Monitor  RESP Acute Respiratory Failure-intubated for the procedure -vent management per ICU if remains vented -wean when able  CV Essential hypertension and hypertensive urgency.  Came in with systolics in the 170s -Aggressive BP control, goal as discussed above -TTE   Hyperlipidemia, unspecified  - Statin for goal LDL < 70    HEME No active issues Monitor CBC  ENDO No history of thyroid issues or diabetes.   -Check A1c-goal HgbA1c < 7  GI/GU Possible history of CKD. Currently likely AKI on CKD 4 Gentle hydration May need renal consult  Fluid/Electrolyte Disorders No active issues Check BMP in the morning  ID Possible Aspiration PNA -CXR -NPO -Monitor Prophylaxis DVT: SCD GI: PPI Bowel: Docusate senna  Diet: NPO until cleared by speech/bedside swallow evaluation  Code Status: Full Code   THE FOLLOWING WERE PRESENT ON ADMISSION: Acute ischemic stroke, hypertensive urgency, essential hypertension, CKD, acute renal  failure, hemiplegia  Risks, benefits, alternatives of IV thrombolysis were discussed and family/patient agreed to proceed. CT imaging was reviewed personally prior to IV thrombolysis administration with no evidence of bleed. -- Milon Dikes, MD Neurologist [facility] Pager: [phone]   CRITICAL CARE ATTESTATION Performed by: Milon Dikes, MD Total critical care time: 45 minutes Critical care time was exclusive of separately billable procedures and treating other patients and/or supervising APPs/Residents/Students Critical care was necessary to treat or prevent imminent or life-threatening deterioration. This patient is critically ill and at significant risk for neurological worsening and/or death and care requires constant monitoring. Critical care  was time spent personally by me on the following activities: development of treatment plan with patient and/or surrogate as well as nursing, discussions with consultants, evaluation of patient's response to treatment, examination of patient, obtaining history from patient or surrogate, ordering and performing treatments and interventions, ordering and review of laboratory studies, ordering and review of radiographic studies, pulse oximetry, re-evaluation of patient's condition, participation in multidisciplinary rounds and medical decision making of high complexity in the care of this patient.

## 2022-07-25 NOTE — Progress Notes (Signed)
E-link notified of patient's SBP holding above goal of 120-140 via arterial line despite multiple interventions. E-link aware BP cuff pressure within range.

## 2022-07-25 NOTE — Anesthesia Preprocedure Evaluation (Signed)
Anesthesia Evaluation  Patient identified by MRN, date of birth, ID band Patient awake    Reviewed: Allergy & Precautions, H&P , NPO status , Patient's Chart, lab work & pertinent test results  Airway Mallampati: II  TM Distance: >3 FB Neck ROM: Full    Dental no notable dental hx.    Pulmonary neg pulmonary ROS, former smoker   Pulmonary exam normal breath sounds clear to auscultation       Cardiovascular hypertension, negative cardio ROS Normal cardiovascular exam Rhythm:Regular Rate:Normal     Neuro/Psych CVA, Residual Symptoms  negative psych ROS   GI/Hepatic negative GI ROS, Neg liver ROS,,,  Endo/Other  negative endocrine ROS    Renal/GU negative Renal ROS  negative genitourinary   Musculoskeletal negative musculoskeletal ROS (+)    Abdominal   Peds negative pediatric ROS (+)  Hematology negative hematology ROS (+)   Anesthesia Other Findings   Reproductive/Obstetrics negative OB ROS                             Anesthesia Physical Anesthesia Plan  ASA: 3 and emergent  Anesthesia Plan: General   Post-op Pain Management:    Induction: Intravenous  PONV Risk Score and Plan: 2 and Ondansetron, Midazolam and Treatment may vary due to age or medical condition  Airway Management Planned: Oral ETT  Additional Equipment: Arterial line  Intra-op Plan:   Post-operative Plan: Extubation in OR and Possible Post-op intubation/ventilation  Informed Consent: I have reviewed the patients History and Physical, chart, labs and discussed the procedure including the risks, benefits and alternatives for the proposed anesthesia with the patient or authorized representative who has indicated his/her understanding and acceptance.     Dental advisory given  Plan Discussed with: CRNA  Anesthesia Plan Comments:        Anesthesia Quick Evaluation

## 2022-07-26 ENCOUNTER — Inpatient Hospital Stay (HOSPITAL_COMMUNITY): Payer: Managed Care, Other (non HMO)

## 2022-07-26 ENCOUNTER — Encounter (HOSPITAL_COMMUNITY): Payer: Self-pay | Admitting: Radiology

## 2022-07-26 DIAGNOSIS — R569 Unspecified convulsions: Secondary | ICD-10-CM | POA: Diagnosis not present

## 2022-07-26 DIAGNOSIS — E872 Acidosis, unspecified: Secondary | ICD-10-CM

## 2022-07-26 DIAGNOSIS — E669 Obesity, unspecified: Secondary | ICD-10-CM

## 2022-07-26 DIAGNOSIS — N184 Chronic kidney disease, stage 4 (severe): Secondary | ICD-10-CM

## 2022-07-26 DIAGNOSIS — I63522 Cerebral infarction due to unspecified occlusion or stenosis of left anterior cerebral artery: Secondary | ICD-10-CM | POA: Diagnosis not present

## 2022-07-26 LAB — MAGNESIUM: Magnesium: 1.7 mg/dL (ref 1.7–2.4)

## 2022-07-26 LAB — CBC WITH DIFFERENTIAL/PLATELET
Abs Immature Granulocytes: 0.1 10*3/uL — ABNORMAL HIGH (ref 0.00–0.07)
Basophils Absolute: 0 10*3/uL (ref 0.0–0.1)
Basophils Relative: 0 %
Eosinophils Absolute: 0 10*3/uL (ref 0.0–0.5)
Eosinophils Relative: 0 %
HCT: 37.1 % — ABNORMAL LOW (ref 39.0–52.0)
Hemoglobin: 13.1 g/dL (ref 13.0–17.0)
Immature Granulocytes: 1 %
Lymphocytes Relative: 5 %
Lymphs Abs: 0.7 10*3/uL (ref 0.7–4.0)
MCH: 32.5 pg (ref 26.0–34.0)
MCHC: 35.3 g/dL (ref 30.0–36.0)
MCV: 92.1 fL (ref 80.0–100.0)
Monocytes Absolute: 1.2 10*3/uL — ABNORMAL HIGH (ref 0.1–1.0)
Monocytes Relative: 8 %
Neutro Abs: 12.8 10*3/uL — ABNORMAL HIGH (ref 1.7–7.7)
Neutrophils Relative %: 86 %
Platelets: 236 10*3/uL (ref 150–400)
RBC: 4.03 MIL/uL — ABNORMAL LOW (ref 4.22–5.81)
RDW: 13.7 % (ref 11.5–15.5)
WBC: 14.8 10*3/uL — ABNORMAL HIGH (ref 4.0–10.5)
nRBC: 0 % (ref 0.0–0.2)

## 2022-07-26 LAB — LIPID PANEL
Cholesterol: 160 mg/dL (ref 0–200)
HDL: 29 mg/dL — ABNORMAL LOW (ref >40)
LDL Cholesterol: UNDETERMINED mg/dL (ref 0–99)
Total CHOL/HDL Ratio: 5.5 RATIO
Triglycerides: 471 mg/dL — ABNORMAL HIGH (ref <150)
VLDL: UNDETERMINED mg/dL (ref 0–40)

## 2022-07-26 LAB — RAPID URINE DRUG SCREEN, HOSP PERFORMED
Amphetamines: NOT DETECTED
Barbiturates: NOT DETECTED
Benzodiazepines: NOT DETECTED
Cocaine: NOT DETECTED
Opiates: NOT DETECTED
Tetrahydrocannabinol: NOT DETECTED

## 2022-07-26 LAB — COMPREHENSIVE METABOLIC PANEL
ALT: 22 U/L (ref 0–44)
AST: 30 U/L (ref 15–41)
Albumin: 3.1 g/dL — ABNORMAL LOW (ref 3.5–5.0)
Alkaline Phosphatase: 51 U/L (ref 38–126)
Anion gap: 12 (ref 5–15)
BUN: 40 mg/dL — ABNORMAL HIGH (ref 6–20)
CO2: 13 mmol/L — ABNORMAL LOW (ref 22–32)
Calcium: 8.6 mg/dL — ABNORMAL LOW (ref 8.9–10.3)
Chloride: 110 mmol/L (ref 98–111)
Creatinine, Ser: 3.41 mg/dL — ABNORMAL HIGH (ref 0.61–1.24)
GFR, Estimated: 21 mL/min — ABNORMAL LOW (ref >60)
Glucose, Bld: 124 mg/dL — ABNORMAL HIGH (ref 70–99)
Potassium: 5 mmol/L (ref 3.5–5.1)
Sodium: 135 mmol/L (ref 135–145)
Total Bilirubin: 0.5 mg/dL (ref 0.3–1.2)
Total Protein: 5.6 g/dL — ABNORMAL LOW (ref 6.5–8.1)

## 2022-07-26 LAB — LDL CHOLESTEROL, DIRECT: Direct LDL: 90 mg/dL (ref 0–99)

## 2022-07-26 LAB — TRIGLYCERIDES: Triglycerides: 443 mg/dL — ABNORMAL HIGH (ref <150)

## 2022-07-26 LAB — PHOSPHORUS
Phosphorus: 4.6 mg/dL (ref 2.5–4.6)
Phosphorus: 4.3 mg/dL (ref 2.5–4.6)

## 2022-07-26 LAB — GLUCOSE, CAPILLARY
Glucose-Capillary: 107 mg/dL — ABNORMAL HIGH (ref 70–99)
Glucose-Capillary: 134 mg/dL — ABNORMAL HIGH (ref 70–99)
Glucose-Capillary: 145 mg/dL — ABNORMAL HIGH (ref 70–99)

## 2022-07-26 MED ORDER — LEVETIRACETAM IN NACL 1000 MG/100ML IV SOLN
1000.0000 mg | Freq: Once | INTRAVENOUS | Status: AC
  Administered 2022-07-26: 1000 mg via INTRAVENOUS
  Filled 2022-07-26: qty 100

## 2022-07-26 MED ORDER — LORAZEPAM 2 MG/ML IJ SOLN
INTRAMUSCULAR | Status: AC
  Filled 2022-07-26: qty 2

## 2022-07-26 MED ORDER — HEPARIN SODIUM (PORCINE) 5000 UNIT/ML IJ SOLN
5000.0000 [IU] | Freq: Three times a day (TID) | INTRAMUSCULAR | Status: DC
  Administered 2022-07-26 – 2022-08-01 (×19): 5000 [IU] via SUBCUTANEOUS
  Filled 2022-07-26 (×19): qty 1

## 2022-07-26 MED ORDER — CALCIUM GLUCONATE-NACL 1-0.675 GM/50ML-% IV SOLN
1.0000 g | Freq: Once | INTRAVENOUS | Status: AC
  Administered 2022-07-26: 1000 mg via INTRAVENOUS
  Filled 2022-07-26: qty 50

## 2022-07-26 MED ORDER — SODIUM BICARBONATE 8.4 % IV SOLN
100.0000 meq | Freq: Once | INTRAVENOUS | Status: AC
  Administered 2022-07-26: 100 meq via INTRAVENOUS
  Filled 2022-07-26: qty 50
  Filled 2022-07-26: qty 100

## 2022-07-26 MED ORDER — VITAL HIGH PROTEIN PO LIQD: 1000.0000 mL | ORAL | Status: DC

## 2022-07-26 MED ORDER — ROSUVASTATIN CALCIUM 20 MG PO TABS
20.0000 mg | ORAL_TABLET | Freq: Every day | ORAL | Status: DC
  Administered 2022-07-26 – 2022-08-01 (×6): 20 mg
  Filled 2022-07-26 (×7): qty 1

## 2022-07-26 MED ORDER — MAGNESIUM SULFATE 2 GM/50ML IV SOLN
2.0000 g | Freq: Once | INTRAVENOUS | Status: AC
  Administered 2022-07-26: 2 g via INTRAVENOUS
  Filled 2022-07-26: qty 50

## 2022-07-26 MED ORDER — SODIUM BICARBONATE 650 MG PO TABS: 1300.0000 mg | ORAL_TABLET | Freq: Three times a day (TID) | ORAL | Status: DC

## 2022-07-26 MED ORDER — LEVETIRACETAM IN NACL 500 MG/100ML IV SOLN
500.0000 mg | Freq: Two times a day (BID) | INTRAVENOUS | Status: DC
  Administered 2022-07-26 – 2022-07-27 (×2): 500 mg via INTRAVENOUS
  Filled 2022-07-26 (×2): qty 100

## 2022-07-26 MED ORDER — PROSOURCE TF20 ENFIT COMPATIBL EN LIQD
60.0000 mL | Freq: Every day | ENTERAL | Status: DC
  Administered 2022-07-26 – 2022-07-27 (×2): 60 mL
  Filled 2022-07-26 (×4): qty 60

## 2022-07-26 MED ORDER — POLYETHYLENE GLYCOL 3350 17 G PO PACK
17.0000 g | PACK | Freq: Every day | ORAL | Status: DC
  Administered 2022-07-27 – 2022-07-31 (×4): 17 g via ORAL
  Filled 2022-07-26 (×5): qty 1

## 2022-07-26 MED ORDER — DOCUSATE SODIUM 100 MG PO CAPS
100.0000 mg | ORAL_CAPSULE | Freq: Two times a day (BID) | ORAL | Status: DC
  Administered 2022-07-26 – 2022-08-01 (×10): 100 mg via ORAL
  Filled 2022-07-26 (×11): qty 1

## 2022-07-26 MED ORDER — SODIUM BICARBONATE 650 MG PO TABS
1300.0000 mg | ORAL_TABLET | Freq: Three times a day (TID) | ORAL | Status: DC
  Administered 2022-07-26 (×3): 1300 mg via ORAL
  Filled 2022-07-26 (×3): qty 2

## 2022-07-26 MED ORDER — OSMOLITE 1.5 CAL PO LIQD
1000.0000 mL | ORAL | Status: DC
  Administered 2022-07-26 – 2022-07-27 (×2): 1000 mL

## 2022-07-26 NOTE — Progress Notes (Signed)
NAME:  Joseph Bright, MRN:  [id number], DOB:  [birth year], LOS: 1 ADMISSION DATE:  07/25/2022, CONSULTATION DATE: 07/25/2022 REFERRING MD:  Dr. Wilford Corner, CHIEF COMPLAINT: Acute right-sided weakness  History of Present Illness:  47 year old male with hypertension, noncompliance with medications who was brought into the emergency department with sudden onset of aphasia and right-sided weakness, stroke code was called, noted to have L ACA occlusion, received TNK and underwent mechanical thrombectomy with TICI 3 result, with reocclusion resulting in stent placement.  Patient remained intubated postprocedure, PCCM was consulted for help evaluation medical management  Pertinent  Medical History   Past Medical History:  Diagnosis Date   Hypertension    Stroke (HCC)      Significant Hospital Events: Including procedures, antibiotic start and stop dates in addition to other pertinent events     Interim History / Subjective:   The patient was successfully extubated Overnight he had 2 episodes of seizure-like activity, received Keppra Remained hypertensive requiring max dose Cleviprex and was started on labetalol infusion Still having shaking chills  Objective   Blood pressure (!) 91/52, pulse 75, temperature 98 F (36.7 C), temperature source Axillary, resp. rate 15, height 5\' 9"  (1.753 m), weight 114.4 kg, SpO2 97 %.    Vent Mode: CPAP;PSV FiO2 (%):  [40 %-100 %] 40 % Set Rate:  [14 bmp] 14 bmp Vt Set:  [560 mL] 560 mL PEEP:  [5 cmH20] 5 cmH20 Pressure Support:  [8 cmH20] 8 cmH20 Plateau Pressure:  [15 cmH20] 15 cmH20   Intake/Output Summary (Last 24 hours) at 07/26/2022 1012 Last data filed at 07/26/2022 0700 Gross per 24 hour  Intake 3680.26 ml  Output 1405 ml  Net 2275.26 ml   Filed Weights   07/25/22 0700  Weight: 114.4 kg    Examination: Physical exam: General: Acutely ill-appearing obese male, lying on the bed HEENT: [place]/AT, eyes anicteric.  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 Continue neuro watch every hour Stroke team is following Continue secondary stroke prophylaxis Echocardiogram showed hyperdynamic left ventricle with grade 1 diastolic dysfunction MRI brain confirmed left ACA territory stroke and small focal stroke left cerebellum and right basal ganglia Continue aspirin and Brilinta Statins are on hold  Poorly controlled hypertension, noncompliant with treatment Overnight blood pressure was not well-controlled above goal despite being on max dose of Cleviprex Was started on milrinone infusion This morning SBP goal was changed to  <180/105 for permissive hypertension  Hold antihypertensive meds for now  Medication counseling as appropriate  Acute respiratory insufficiency, postprocedure Patient was successfully extubated yesterday  CKD stage IV, 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 Acute metabolic acidosis Patient is at baseline serum creatinine Serum bicarbonate dropped to 13 Will give him 100 mEq of bicarbonate push followed by 1300 mg 3 times daily Monitor intake and output Avoid nephrotoxic agents  Morbid obesity Keep n.p.o, speech and swallow evaluation today Diet and exercise counseling when appropriate  Obstructive sleep apnea, noncompliant with CPAP Per patient's family he had CPAP long time ago but has not been using it for quite some time now Needs repeat sleep studies as an outpatient  Best Practice (  right click and "Reselect all SmartList Selections" daily)   Diet/type: NPO speech and swallow evaluation DVT prophylaxis:  SCD GI prophylaxis: N/A Lines: N/A Foley: Discontinued today Code Status:  full code Last date of multidisciplinary goals of care discussion [Per primary team]  Labs   CBC: Recent Labs  Lab 07/25/22 0718 07/25/22 0723 07/26/22 0500  WBC 4.2  --  14.8*  NEUTROABS 2.2  --  12.8*  HGB 13.3 13.9 13.1  HCT 39.4 41.0 37.1*  MCV 93.1  --  92.1  PLT 206  --  236    Basic Metabolic Panel: Recent Labs  Lab 07/25/22 0718 07/25/22 0723 07/26/22 0500 07/26/22 0545  NA 140 141  --  135  K 4.4 4.4  --  5.0  CL 109 111  --  110  CO2 18*  --   --  13*  GLUCOSE 131* 127*  --  124*  BUN 36* 36*  --  40*  CREATININE 3.16* 3.60*  --  3.41*  CALCIUM 9.1  --   --  8.6*  MG  --   --  1.7  --    GFR: Estimated Creatinine Clearance: 33.4 mL/min (A) (by C-G formula based on SCr of 3.41 mg/dL (H)). Recent Labs  Lab 07/25/22 0718 07/26/22 0500  WBC 4.2 14.8*    Liver Function Tests: Recent Labs  Lab 07/25/22 0718 07/26/22 0545  AST 28 30  ALT 29 22  ALKPHOS 66 51  BILITOT 0.4 0.5  PROT 6.8 5.6*  ALBUMIN 3.8 3.1*   No results for input(s): "LIPASE", "AMYLASE" in the last 168 hours. No results for input(s): "AMMONIA" in the last 168 hours.  ABG    Component Value Date/Time   HCO3 27.0 (H) 11/19/2006 1413   TCO2 18 (L) 07/25/2022 0723     Coagulation Profile: Recent Labs  Lab 07/25/22 0718  INR 1.0    Cardiac Enzymes: No results for input(s): "CKTOTAL", "CKMB", "CKMBINDEX", "TROPONINI" in the last 168 hours.  HbA1C: Hgb A1c MFr Bld  Date/Time Value Ref Range Status  07/25/2022 11:53 AM 5.6 4.8 - 5.6 % Final    Comment:    (NOTE) Pre diabetes:          5.7%-6.4%  Diabetes:              >6.4%  Glycemic control for   <7.0% adults with diabetes     CBG: Recent Labs  Lab 07/25/22 0715 07/26/22 0231  GLUCAP 128* 145*    Critical care time:     This patient is critically ill with multiple organ system failure which requires frequent high complexity  decision making, assessment, support, evaluation, and titration of therapies. This was completed through the application of advanced monitoring technologies and extensive interpretation of multiple databases.  During this encounter critical care time was devoted to patient care services described in this note for 34 minutes.    Cheri Fowler, MD [facility] See Amion for pager If no response to pager, please call [phone] until 7pm After 7pm, Please call E-link [phone]

## 2022-07-26 NOTE — Progress Notes (Signed)
? ?  Inpatient Rehab Admissions Coordinator : ? ?Per therapy recommendations, patient was screened for CIR candidacy by Arieanna Pressey RN MSN.  At this time patient appears to be a potential candidate for CIR. I will place a rehab consult per protocol for full assessment. Please call me with any questions. ? ?Jonique Kulig RN MSN ?Admissions Coordinator ?[phone] ?  ?

## 2022-07-26 NOTE — Evaluation (Signed)
Clinical/Bedside Swallow Evaluation Patient Details  Name: Joseph Bright MRN: [id number] Date of Birth: 1975-07-20  Today's Date: 07/26/2022 Time: SLP Start Time (ACUTE ONLY): 1025 SLP Stop Time (ACUTE ONLY): 1045 SLP Time Calculation (min) (ACUTE ONLY): 20 min  Past Medical History:  Past Medical History:  Diagnosis Date   Hypertension    Stroke [facility])    Past Surgical History:  Past Surgical History:  Procedure Laterality Date   RADIOLOGY WITH ANESTHESIA N/A 07/25/2022   Procedure: RADIOLOGY WITH ANESTHESIA;  Surgeon: Radiologist, Medication, MD;  Location: [facility] OR;  Service: Radiology;  Laterality: N/A;   HPI:  Patient is a 47 y.o. male with PMH: HTN, noncompliance with medications who was brought to ED with sudden onset of aphasia and right sided weakness. MRI brain showeed left ACA territory infarct, cytotoxic edema with no hemorrhagic transformation, multiple additional scattered mostly punctate ischemic foci in  the left greater than right MCA, PCA, and occasionally cerebellar artery territories; No significant intracranial mass effect. He received TNK and underwent mechanical thrombectomy on 07/25/22. He remained intubated following procedure and was extubated same day at 1644.    Assessment / Plan / Recommendation  Clinical Impression  Patient presents with clinical s/s of what appears to be a cognitive-based dysphagia. When SLP entered room, he had eyes closed and appeared asleep. Per RN, goal is to determine if they can give patient oral meds or if he will need a Cortrak. Patient did open eyes to voice and tactile cues but required constant cues to stay alert. After sitting HOB up, patient given small ice chips. He demonstrated mildly prolonged mastication and oral transit and suspected delayed swallow initiation, however no overt s/s aspiration or penetration. He tolerated spoon bites of puree (applesauce) with mild oral phase delays but no overt s/s aspiration or penetration. SLP  recommending continue NPO status but allow PRN ice chips and meds whole or crushed in puree. SLP will follow for PO readiness trials. SLP Visit Diagnosis: Dysphagia, unspecified (R13.10)    Aspiration Risk  Moderate aspiration risk;Risk for inadequate nutrition/hydration    Diet Recommendation NPO except meds;Ice chips PRN after oral care   Liquid Administration via: Cup;Spoon;No straw Medication Administration: Whole meds with puree Supervision: Full supervision/cueing for compensatory strategies;Staff to assist with self feeding Compensations: Slow rate;Small sips/bites Postural Changes: Seated upright at 90 degrees    Other  Recommendations Oral Care Recommendations: Oral care BID;Staff/trained caregiver to provide oral care    Recommendations for follow up therapy are one component of a multi-disciplinary discharge planning process, led by the attending physician.  Recommendations may be updated based on patient status, additional functional criteria and insurance authorization.  Follow up Recommendations Acute inpatient rehab (3hours/day)      Assistance Recommended at Discharge    Functional Status Assessment Patient has had a recent decline in their functional status and demonstrates the ability to make significant improvements in function in a reasonable and predictable amount of time.  Frequency and Duration min 2x/week  2 weeks       Prognosis Prognosis for improved oropharyngeal function: Good      Swallow Study   General Date of Onset: 07/25/22 HPI: Patient is a 46 y.o. male with PMH: HTN, noncompliance with medications who was brought to ED with sudden onset of aphasia and right sided weakness. MRI brain showeed left ACA territory infarct, cytotoxic edema with no hemorrhagic transformation, multiple additional scattered mostly punctate ischemic foci in  the left greater than right MCA, PCA, and  occasionally cerebellar artery territories; No significant intracranial  mass effect. He received TNK and underwent mechanical thrombectomy on 07/25/22. He remained intubated following procedure and was extubated same day at 1644. Type of Study: Bedside Swallow Evaluation Previous Swallow Assessment: none found Diet Prior to this Study: NPO Temperature Spikes Noted: No Respiratory Status: Nasal cannula History of Recent Intubation: Yes Total duration of intubation (days): 1 days Date extubated: 07/25/22 Behavior/Cognition: Lethargic/Drowsy;Requires cueing Oral Cavity Assessment: Within Functional Limits Oral Care Completed by SLP: Yes Oral Cavity - Dentition: Adequate natural dentition Self-Feeding Abilities: Total assist Patient Positioning: Upright in bed Baseline Vocal Quality: Low vocal intensity Volitional Cough: Cognitively unable to elicit Volitional Swallow: Unable to elicit    Oral/Motor/Sensory Function Overall Oral Motor/Sensory Function: Within functional limits   Ice Chips Ice chips: Impaired Oral Phase Functional Implications: Prolonged oral transit Pharyngeal Phase Impairments: Suspected delayed Swallow   Thin Liquid Thin Liquid: Impaired Presentation: Cup Oral Phase Impairments: Poor awareness of bolus Pharyngeal  Phase Impairments: Suspected delayed Swallow    Nectar Thick Nectar Thick Liquid: Not tested   Honey Thick Honey Thick Liquid: Not tested   Puree Puree: Impaired Oral Phase Impairments: Poor awareness of bolus Oral Phase Functional Implications: Prolonged oral transit Pharyngeal Phase Impairments: Suspected delayed Swallow   Solid     Solid: Not tested      Angela Nevin, MA, CCC-SLP Speech Therapy

## 2022-07-26 NOTE — Evaluation (Signed)
Physical Therapy Evaluation Patient Details Name: Joseph Bright MRN: [id number] DOB: 28-Jun-1975 Today's Date: 07/26/2022  History of Present Illness  Pt is a 47 year old male admitted 07/25/22 with sudden onset of aphasia and right-sided weakness, stroke code was called, noted to have L ACA occlusion, received TNK and underwent mechanical thrombectomy with TICI 3 result, with reocclusion resulting in stent placement.  PMH of HTN, sleep apnea.  Clinical Impression  Patient presents with decreased mobility due to R side weakness, decreased sitting balance, decreased communication and cognition.   Currently needing +2 A for up OOB to chair with little to no support using R LE.  Patient was independent living with wife and helping with 46 y/o daughter previously.  Feel he will benefit from skilled PT in the acute setting and from follow up inpatient rehab prior to d/c home.      Recommendations for follow up therapy are one component of a multi-disciplinary discharge planning process, led by the attending physician.  Recommendations may be updated based on patient status, additional functional criteria and insurance authorization.  Follow Up Recommendations       Assistance Recommended at Discharge Frequent or constant Supervision/Assistance  Patient can return home with the following  Two people to help with walking and/or transfers;Two people to help with bathing/dressing/bathroom;Assistance with feeding;Help with stairs or ramp for entrance    Equipment Recommendations BSC/3in1;Wheelchair (measurements PT)  Recommendations for Other Services  Rehab consult    Functional Status Assessment Patient has had a recent decline in their functional status and demonstrates the ability to make significant improvements in function in a reasonable and predictable amount of time.     Precautions / Restrictions Precautions Precautions: Fall Precaution Comments: right sided hemiparesis,  receptive/expressive deficits Restrictions Weight Bearing Restrictions: No      Mobility  Bed Mobility Overal bed mobility: Needs Assistance Bed Mobility: Supine to Sit     Supine to sit: Max assist     General bed mobility comments: Assist for bringing RLE off the bed and bringing trunk up to sitting    Transfers Overall transfer level: Needs assistance Equipment used: None Transfers: Sit to/from Stand, Bed to chair/wheelchair/BSC Sit to Stand: Max assist, +2 safety/equipment     Squat pivot transfers: Max assist, +2 physical assistance     General transfer comment: Decreased activation noted in the right knee and hip with standing requiring total assist to maintain extension.    Ambulation/Gait               General Gait Details: unable  Stairs            Wheelchair Mobility    Modified Rankin (Stroke Patients Only) Modified Rankin (Stroke Patients Only) Pre-Morbid Rankin Score: No symptoms Modified Rankin: Severe disability     Balance Overall balance assessment: Needs assistance   Sitting balance-Leahy Scale: Poor Sitting balance - Comments: LOB to the right frequently requiring min to mod assist to correct; when holding bed rail able to maintain sitting     Standing balance-Leahy Scale: Poor Standing balance comment: Max +2 for standing                             Pertinent Vitals/Pain Pain Assessment Pain Assessment: Faces Faces Pain Scale: No hurt    Home Living Family/patient expects to be discharged to:: Private residence Living Arrangements: Spouse/significant other (78 yr old daughter) Available Help at Discharge: Family;Available 24 hours/day;Available PRN/intermittently (she has  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  No other family around to help) Type of Home: Apartment Home Access: Stairs to enter Entrance Stairs-Rails:  Lawyer of Steps: 15   Home Layout: One level Home Equipment: None      Prior Function Prior Level of Function : Independent/Modified Independent;Driving                     Hand Dominance   Dominant Hand: Right    Extremity/Trunk Assessment   Upper Extremity Assessment Upper Extremity Assessment: Defer to OT evaluation RUE Deficits / Details: Brunnstrum stage I in the arm with no active movement noted in the arm or digits. RUE Sensation:  (unable to determine secondary to global communication deficits) RUE Coordination: decreased fine motor;decreased gross motor    Lower Extremity Assessment Lower Extremity Assessment: RLE deficits/detail RLE Deficits / Details: PROM difficult with increased tone throughout, no active movement noted RLE Coordination: decreased gross motor;decreased fine motor    Cervical / Trunk Assessment Cervical / Trunk Assessment: Normal  Communication   Communication: Expressive difficulties  Cognition Arousal/Alertness: Awake/alert Behavior During Therapy: WFL for tasks assessed/performed Overall Cognitive Status: Impaired/Different from baseline Area of Impairment: Following commands                       Following Commands: Follows one step commands inconsistently       General Comments: Pt non-verbal and demonstrated inconsistency with following verbal instructional commands.  He was able to nod his head "yes/no" to when given verbal and visual cueing but then was inconsistent nodding his head yes to almost all questions asked.        General Comments      Exercises     Assessment/Plan    PT Assessment Patient needs continued PT services  PT Problem List Decreased strength;Decreased balance;Decreased cognition;Decreased mobility;Decreased knowledge of use of DME;Decreased activity tolerance;Decreased coordination;Impaired tone       PT Treatment Interventions DME instruction;Functional  mobility training;Balance training;Patient/family education;Therapeutic activities;Neuromuscular re-education;Wheelchair mobility training    PT Goals (Current goals can be found in the Care Plan section)  Acute Rehab PT Goals Patient Stated Goal: unable to state PT Goal Formulation: Patient unable to participate in goal setting Time For Goal Achievement: 08/10/22 Potential to Achieve Goals: Good    Frequency Min 4X/week     Co-evaluation PT/OT/SLP Co-Evaluation/Treatment: Yes Reason for Co-Treatment: Complexity of the patient's impairments (multi-system involvement);For patient/therapist safety PT goals addressed during session: Mobility/safety with mobility;Balance OT goals addressed during session: ADL's and self-care       AM-PAC PT "6 Clicks" Mobility  Outcome Measure Help needed turning from your back to your side while in a flat bed without using bedrails?: A Lot Help needed moving from lying on your back to sitting on the side of a flat bed without using bedrails?: Total Help needed moving to and from a bed to a chair (including a wheelchair)?: Total Help needed standing up from a chair using your arms (e.g., wheelchair or bedside chair)?: Total Help needed to walk in hospital room?: Total Help needed climbing 3-5 steps with a railing? : Total 6 Click Score: 7    End of Session Equipment Utilized During Treatment: Gait belt Activity Tolerance: Patient limited by fatigue Patient left: in chair;with chair alarm set;with call bell/phone within reach Nurse Communication: Need for lift equipment;Mobility  status PT Visit Diagnosis: Other symptoms and signs involving the nervous system (R29.898);Other abnormalities of gait and mobility (R26.89);Hemiplegia and hemiparesis Hemiplegia - Right/Left: Right Hemiplegia - dominant/non-dominant: Dominant Hemiplegia - caused by: Cerebral infarction    Time: 1419-1450 PT Time Calculation (min) (ACUTE ONLY): 31 min   Charges:   PT  Evaluation $PT Eval Moderate Complexity: 1 Mod          Joseph Bright, PT Acute Rehabilitation Services Office:[phone] 07/26/2022   Elray Mcgregor 07/26/2022, 5:00 PM

## 2022-07-26 NOTE — Progress Notes (Signed)
PT Cancellation Note  Patient Details Name: Linkoln Alkire MRN: [id number] DOB: 10-07-1975   Cancelled Treatment:    Reason Eval/Treat Not Completed: Active bedrest order; noted pt for bedrest until 5 this evening.  Will follow up when activity orders upgraded.   Elray Mcgregor 07/26/2022, 9:28 AM Sheran Lawless, PT Acute Rehabilitation Services Office:[phone] 07/26/2022

## 2022-07-26 NOTE — Progress Notes (Signed)
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 Corliss Skains   Subjective: Patient sitting up in the chair getting a bath. He is very sleepy but arousable to voice. No family in the room.   Allergies: Patient has no known allergies.  Medications: Prior to Admission medications   Medication Sig Start Date End Date Taking? Authorizing Provider  amLODipine (NORVASC) 10 MG tablet Take 10 mg by mouth daily.   Yes [provider]  hydrALAZINE (APRESOLINE) 50 MG tablet Take 50 mg by mouth 2 (two) times daily.   Yes [provider]  isosorbide mononitrate (IMDUR) 60 MG 24 hr tablet Take 60 mg by mouth daily.   Yes [provider]  labetalol (NORMODYNE) 200 MG tablet Take 200 mg by mouth 2 (two) times daily.   Yes [provider]  simvastatin (ZOCOR) 20 MG tablet Take 20 mg by mouth daily.   Yes [provider]  traMADol (ULTRAM) 50 MG tablet TAKE 1 TO 2 TABLETS(50 TO 100 MG) BY MOUTH AT BEDTIME AS NEEDED Patient not taking: Reported on 07/25/2022 06/25/18   Hilts, Casimiro Needle, MD     Vital Signs: BP (!) 149/89 (BP Location: Left Arm)   Pulse 89   Temp 98.3 F (36.8 C) (Oral)   Resp 17   Ht 5\' 9"  (1.753 m)   Wt 252 lb 3.3 oz (114.4 kg)   SpO2 96%   BMI 37.24 kg/m   Physical Exam Constitutional:      General: He is not in acute distress.    Appearance: He is not ill-appearing.  Cardiovascular:     Comments: Right groin site is clean, soft, dry and non-tender.  Pulmonary:     Effort: Pulmonary effort is normal.  Skin:    General: Skin is warm and dry.  Neurological:     Comments: No movement on the right side of the body. Able to move left and right arm. Followed all commands. Able to speak in 2-3 word sentences.      Imaging: DG Abd Portable 1V  Result Date:  07/26/2022 CLINICAL DATA:  Feeding tube placement EXAM: PORTABLE ABDOMEN - 1 VIEW COMPARISON:  None Available. FINDINGS: Feeding tube tip is in the mid to distal stomach. Nonobstructive bowel gas pattern. IMPRESSION: Feeding tube tip in the stomach. Electronically Signed   By: Charlett Nose M.D.   On: 07/26/2022 15:18   EEG adult  Result Date: 07/26/2022 Charlsie Quest, MD     07/26/2022  9:52 AM Patient Name: Joseph Bright MRN: [id number] Epilepsy Attending: Charlsie Quest Referring Physician/Provider: Gordy Councilman, MD Date: 07/26/2022 Duration: 23.52 mins Patient history: 48 yo M with brief focal seizure of RUE shaking followed by worsening aphasia for 1-2 min, self-resolved, exam otherwise returned to baseline. EEG to evaluate for seizure Level of alertness: Awake, asleep AEDs during EEG study: LEV Technical aspects: This EEG study was done with scalp electrodes positioned according to the 10-20 International system of electrode placement. Electrical activity was reviewed with band pass filter of 1-70Hz , sensitivity of 7 uV/mm, display speed of 60mm/sec with a 60Hz  notched filter applied as appropriate. EEG data were recorded continuously and digitally stored.  Video monitoring was available and reviewed as appropriate. Description: During awake state, no clear posterior dominant rhythm seen.  Sleep was characterized by sleep spindles (12 to 14 Hz), maximal  frontocentral region.  EEG showed continuous generalized and lateralized left hemisphere 3-6 Hz theta- delta slowing.  Hyperventilation and photic stimulation were not performed.   ABNORMALITY -Continuous slow, generalized and lateralized left hemisphere IMPRESSION: This study is suggestive of cortical dysfunction arising from left hemisphere likely secondary to underlying structural abnormality.  Additionally there is moderate diffuse encephalopathy, nonspecific etiology.  No seizures or epileptiform discharges were seen throughout the recording.  Please note lack of epileptiform abnormality during interictal EEG does not exclude the diagnosis of epilepsy. Charlsie Quest   MR ANGIO HEAD WO CONTRAST  Result Date: 07/26/2022 CLINICAL DATA:  47 year old male code stroke presentation yesterday with left ACA A2 segment occlusion. Status post TNK. Status post endovascular revascularization of the left A2 segment, including rescue stenting. EXAM: MRA HEAD WITHOUT CONTRAST TECHNIQUE: Angiographic images of the Circle of Willis were acquired using MRA technique without intravenous contrast. COMPARISON:  MRI today.  CTA head and neck yesterday. FINDINGS: Anterior circulation: Antegrade flow in both ICA siphons. No siphon stenosis. Patent carotid termini. MCA and ACA origins remain normal. Bilateral MCA M1 segments, MCA bifurcations and visible bilateral MCA branches appear stable and within normal limits. Both ACA A1 segments in the anterior communicating arteries are patent, within normal limits. There is an 8 mm segment of pronounced left ACA proximal A2 attenuation which is favored to be vascular stent artifact in this setting. There is maintained ACA antegrade flow signal distal to that segment. No major left ACA branch occlusion is identified. Contralateral right ACA branches are within normal limits. Posterior circulation: Antegrade flow in the posterior circulation. Vertebrobasilar tortuosity without stenosis redemonstrated. Mild to moderate irregularity and stenosis of the left PCA P2 segment is stable on series 455, image 7. Other PCA branches are within normal limits. Anatomic variants: None. Other: MRI today reported separately. IMPRESSION: 1. Left ACA proximal A2 segment is attenuated, but likely due to vascular stent artifact. Preserved distal left ACA flow signal. 2. Otherwise negative Anterior circulation. 3. Stable Left PCA P2 segment mild to moderate irregularity and stenosis. 4. MRI today reported separately. Electronically Signed   By: Odessa Fleming  M.D.   On: 07/26/2022 09:44   MR BRAIN WO CONTRAST  Result Date: 07/26/2022 CLINICAL DATA:  47 year old male code stroke presentation yesterday with left ACA A2 segment occlusion. Status post TNK. Status post endovascular revascularization of the left A2 segment. EXAM: MRI HEAD WITHOUT CONTRAST TECHNIQUE: Multiplanar, multiecho pulse sequences of the brain and surrounding structures were obtained without intravenous contrast. COMPARISON:  Head CT, CTA head and neck yesterday. FINDINGS: Brain: Widespread restricted diffusion in the left ACA territory, confluent along the left cingulate gyrus, superior frontal gyrus. See coronal DWI images. Distal ACA territory involvement also at the posterior MCA/ACA watershed. And there are superimposed scattered mostly punctate areas of restricted diffusion elsewhere in the left MCA, left greater than right PCA, and occasionally also the right MCA (series 3, image 18) and cerebellar (image 12) vascular territories. Cytotoxic edema in the affected areas. No hemorrhagic transformation is identified. But also superimposed cystic encephalomalacia in the body of the corpus callosum (series 6, image 13). Chronic appearing right greater than left thalamic lacunar infarcts (series 7, image 14). Basal ganglia relatively spared. No chronic cortical encephalomalacia is identified. Brainstem remains normal. Partially empty sella. No midline shift, mass effect, evidence of mass lesion, ventriculomegaly, extra-axial collection. Cervicomedullary junction within normal limits. Vascular: Major intracranial vascular flow voids are preserved. Intracranial MRA is reported separately today. Skull and upper  cervical spine: Negative visible cervical spine. Visualized bone marrow signal is within normal limits. Sinuses/Orbits: Negative orbits. Paranasal sinuses and mastoids are stable and well aerated. Other: Visible internal auditory structures appear normal. IMPRESSION: 1. Confluent Left ACA  territory infarct. Cytotoxic edema with no hemorrhagic transformation. 2. Multiple additional scattered mostly punctate ischemic foci in the left greater than right MCA, PCA, and occasionally cerebellar artery territories. No significant intracranial mass effect. 3. 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 4. See intracranial MRA is reported separately today. Electronically Signed   By: Odessa Fleming M.D.   On: 07/26/2022 09:39   CT HEAD WO CONTRAST ( )  Result Date: 07/26/2022 CLINICAL DATA:  Stroke follow-up EXAM: CT HEAD WITHOUT CONTRAST TECHNIQUE: Contiguous axial images were obtained from the base of the skull through the vertex without intravenous contrast. RADIATION DOSE REDUCTION: This exam was performed according to the departmental dose-optimization program which includes automated exposure control, adjustment of the mA and/or kV according to patient size and/or use of iterative reconstruction technique. COMPARISON:  None Available. FINDINGS: Motion degraded study. Brain: There is no mass, hemorrhage or extra-axial collection. The size and configuration of the ventricles and extra-axial CSF spaces are normal. 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 There is periventricular hypoattenuation compatible with chronic microvascular disease. Vascular: Anterior cerebral artery stent. Skull: The visualized skull base, calvarium and extracranial soft tissues are normal. Sinuses/Orbits: Mild maxillary sinus mucosal thickening. The orbits are normal. IMPRESSION: 1. Motion degraded study without acute intracranial abnormality. 2. 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 Electronically Signed   By: Deatra Robinson M.D.   On: 07/26/2022 03:48   ECHOCARDIOGRAM COMPLETE  Result Date: 07/25/2022    ECHOCARDIOGRAM REPORT   Patient Name:   [facility] Date of Exam: 07/25/2022 Medical Rec #:  [id number]      Height:       69.0 in Accession #:     [id number]     Weight:       252.2 lb Date of Birth:  16-Oct-1975      BSA:          2.280 m Patient Age:    46 years       BP:           104/70 mmHg Patient Gender: M              HR:           58 bpm. Exam Location:  Inpatient Procedure: 2D Echo, 3D Echo, Cardiac Doppler, Color Doppler and Strain Analysis Indications:    Stroke  History:        Patient has no prior history of Echocardiogram examinations.                 Risk Factors:Hypertension and Dyslipidemia.  Sonographer:    Sheralyn Boatman RDCS Referring Phys: [id number] ASHISH ARORA  Sonographer Comments: Patient is obese and echo performed with patient supine and on artificial respirator. Image acquisition challenging due to patient body habitus. IMPRESSIONS  1. Left ventricular ejection fraction, by estimation, is 70 to 75%. The left ventricle has hyperdynamic function. The left ventricle has no regional wall motion abnormalities. There is severe asymmetric left ventricular hypertrophy of the septal segment. Left ventricular diastolic parameters are consistent with Grade I diastolic dysfunction (impaired relaxation). The average left ventricular global longitudinal strain is -22.0 %. The global longitudinal strain is normal.  2. Right ventricular systolic function is normal. The right  ventricular size is mildly enlarged.  3. The mitral valve is normal in structure. No evidence of mitral valve regurgitation. No evidence of mitral stenosis.  4. The aortic valve is tricuspid. Aortic valve regurgitation is not visualized.  5. The inferior vena cava is normal in size with greater than 50% respiratory variability, suggesting right atrial pressure of 3 mmHg. Comparison(s): No prior Echocardiogram. 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 FINDINGS  Left Ventricle: Greatest hypertrophy 18 mm Basal Septal on PSAX imaging. No resting systolic  anterior motion of the mitral valve. Abnroaml basal septal strain with normal GLS. Left ventricular ejection fraction, by estimation, is 70 to 75%. The left ventricle has hyperdynamic function. The left ventricle has no regional wall motion abnormalities. The average left ventricular global longitudinal strain is -22.0 %. The global longitudinal strain is normal. The left ventricular internal cavity size was  normal in size. There is severe asymmetric left ventricular hypertrophy of the septal segment. Left ventricular diastolic parameters are consistent with Grade I diastolic dysfunction (impaired relaxation). Right Ventricle: The right ventricular size is mildly enlarged. No increase in right ventricular wall thickness. Right ventricular systolic function is normal. Left Atrium: Left atrial size was normal in size. Right Atrium: Right atrial size was normal in size. Pericardium: There is no evidence of pericardial effusion. Mitral Valve: The mitral valve is normal in structure. No evidence of mitral valve regurgitation. No evidence of mitral valve stenosis. Tricuspid Valve: The tricuspid valve is normal in structure. Tricuspid valve regurgitation is not demonstrated. No evidence of tricuspid stenosis. Aortic Valve: The aortic valve is tricuspid. Aortic valve regurgitation is not visualized. Pulmonic Valve: The pulmonic valve was normal in structure. Pulmonic valve regurgitation is trivial. No evidence of pulmonic stenosis. Aorta: The aortic root and ascending aorta are structurally normal, with no evidence of dilitation. Venous: The inferior vena cava is normal in size with greater than 50% respiratory variability, suggesting right atrial pressure of 3 mmHg. IAS/Shunts: No atrial level shunt detected by color flow Doppler.  LEFT VENTRICLE PLAX 2D LVIDd:         4.00 cm   Diastology LVIDs:         1.70 cm   LV e' medial:    7.07 cm/s LV PW:         2.10 cm   LV E/e' medial:  10.8 LV IVS:        1.60 cm   LV e'  lateral:   5.77 cm/s LVOT diam:     2.30 cm   LV E/e' lateral: 13.3 LV SV:         109 LV SV Index:   48        2D Longitudinal Strain LVOT Area:     4.15 cm  2D Strain GLS Avg:     -22.0 %  RIGHT VENTRICLE             IVC RV S prime:     16.10 cm/s  IVC diam: 1.50 cm TAPSE (M-mode): 2.3 cm LEFT ATRIUM           Index        RIGHT ATRIUM          Index LA diam:      3.50 cm 1.54 cm/m   RA Area:     8.70 cm LA Vol (A2C): 54.7 ml 23.99 ml/m  RA Volume:   16.60 ml 7.28 ml/m LA  Vol (A4C): 17.5 ml 7.68 ml/m  AORTIC VALVE             PULMONIC VALVE LVOT Vmax:   134.00 cm/s PR End Diast Vel: 1.58 msec LVOT Vmean:  83.600 cm/s LVOT VTI:    0.263 m  AORTA Ao Root diam: 3.30 cm Ao Asc diam:  3.20 cm MITRAL VALVE MV Area (PHT): 3.12 cm    SHUNTS MV Decel Time: 243 msec    Systemic VTI:  0.26 m MV E velocity: 76.50 cm/s  Systemic Diam: 2.30 cm MV A velocity: 80.40 cm/s MV E/A ratio:  0.95 Riley Lam MD Electronically signed by Riley Lam MD Signature Date/Time: 07/25/2022/3:49:04 PM    Final    CT ANGIO HEAD NECK W WO CM (CODE STROKE)  Result Date: 07/25/2022 CLINICAL DATA:  Code stroke. 47 year old male. Aphasia, right side weakness. EXAM: CT ANGIOGRAPHY HEAD AND NECK WITH AND WITHOUT CONTRAST TECHNIQUE: Multidetector CT imaging of the head and neck was performed using the standard protocol during bolus administration of intravenous contrast. Multiplanar CT image reconstructions and MIPs were obtained to evaluate the vascular anatomy. Carotid stenosis measurements (when applicable) are obtained utilizing NASCET criteria, using the distal internal carotid diameter as the denominator. RADIATION DOSE REDUCTION: This exam was performed according to the departmental dose-optimization program which includes automated exposure control, adjustment of the mA and/or kV according to patient size and/or use of iterative reconstruction technique. CONTRAST:  75mL OMNIPAQUE IOHEXOL 350 MG/ML SOLN COMPARISON:   Plain head CT 0720 hours today. FINDINGS: CTA NECK Skeleton: No acute osseous abnormality identified. Cervical spine degeneration chiefly C5-C6. Upper chest: Negative. Other neck: Negative. Aortic arch: Mildly tortuous arch. Bovine arch configuration. No arch atherosclerosis. Right carotid system: Negative. Left carotid system: Bovine left CCA origin without plaque or stenosis. No cervical left carotid plaque or stenosis. Vertebral arteries: Negative, and fairly codominant cervical vertebral arteries. CTA HEAD Posterior circulation: Mild V4 segment calcified plaque on the left. Codominant distal vertebral arteries and vertebrobasilar junction are patent without stenosis. Normal left PICA origin. Right AICA appears dominant. Patent basilar artery without stenosis. Basilar tip, SCA and PCA origins appear normal. Posterior communicating arteries are diminutive or absent. Right PCA branches are within normal limits. There is moderate left P2 segment irregularity and stenosis on series 12, image 23 distal left PCA branches remain patent. Anterior circulation: Patent ICA siphons with no plaque or stenosis. Patent carotid termini, MCA and ACA origins. Bilateral MCA branches are patent and within normal limits. ACA A1 segments and small (fenestrated) anterior communicating arteries are within normal limits. The left ACA A2 segment is occluded on series 7, image 97 about 13 mm from its origin. See also series 12, image 19. No distal reconstitution identified. Right ACA branches remain patent. There is a mild distal right ACA (84) stenosis also on image 19. Venous sinuses: Early contrast timing, not evaluated. Anatomic variants: None significant. Review of the MIP images confirms the above findings Left ACA finding discussed by telephone with Dr. Wilford Corner on 07/25/2022 at 07:32 . IMPRESSION: 1. 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 No distal reconstitution. No other ELVO. This was  discussed by telephone with Dr. Wilford Corner on 07/25/2022 at 07:32. 2. Very little atherosclerosis identified in the head or neck. But there is Moderate stenosis of the Left PCA P2 segment. And mild right ACA A4 stenosis. Electronically Signed   By: Odessa Fleming M.D.   On: 07/25/2022 07:42  CT HEAD CODE STROKE WO CONTRAST  Result Date: 07/25/2022 CLINICAL DATA:  Code stroke. 47 year old male. Aphasia, right side weakness. EXAM: CT HEAD WITHOUT CONTRAST TECHNIQUE: Contiguous axial images were obtained from the base of the skull through the vertex without intravenous contrast. RADIATION DOSE REDUCTION: This exam was performed according to the departmental dose-optimization program which includes automated exposure control, adjustment of the mA and/or kV according to patient size and/or use of iterative reconstruction technique. COMPARISON:  Head CT 11/19/2006. FINDINGS: Brain: New since 2008 but chronic appearing, circumscribed lacunar infarct lateral right thalamus series 2, image 18. Increased mild additional patchy white matter opacity such as in the left hemisphere series 2, image 24. No midline shift, ventriculomegaly, mass effect, evidence of mass lesion, intracranial hemorrhage or evidence of cortically based acute infarction. Chronic partially empty sella. Vascular: Mild Calcified atherosclerosis at the skull base. Skull: No acute osseous abnormality identified. Sinuses/Orbits: Right maxillary molar dental disease with periapical cyst extending into the alveolar recess of that sinus. Mild bilateral maxillary mucosal thickening. Other visualized paranasal sinuses and mastoids are stable and well aerated. Other: Visualized orbits and scalp soft tissues are within normal limits. ASPECTS [facility] Stroke Program Early CT Score) Total score (0-10 with 10 being normal): 10 IMPRESSION: 1. No acute cortically based infarct or acute intracranial hemorrhage identified. ASPECTS 10. 2. New but chronic appearing right thalamic  lacune since 2008. Some increased white matter disease since that time. 3. These results were communicated to Dr. Wilford Corner at 7:27 am on 07/25/2022 by text page via the [facility] messaging system. Electronically Signed   By: Odessa Fleming M.D.   On: 07/25/2022 07:28    Labs:  CBC: Recent Labs    07/25/22 0718 07/25/22 0723 07/26/22 0500  WBC 4.2  --  14.8*  HGB 13.3 13.9 13.1  HCT 39.4 41.0 37.1*  PLT 206  --  236    COAGS: Recent Labs    07/25/22 0718  INR 1.0  APTT 26    BMP: Recent Labs    07/25/22 0718 07/25/22 0723 07/26/22 0545  NA 140 141 135  K 4.4 4.4 5.0  CL 109 111 110  CO2 18*  --  13*  GLUCOSE 131* 127* 124*  BUN 36* 36* 40*  CALCIUM 9.1  --  8.6*  CREATININE 3.16* 3.60* 3.41*  GFRNONAA 23*  --  21*    LIVER FUNCTION TESTS: Recent Labs    07/25/22 0718 07/26/22 0545  BILITOT 0.4 0.5  AST 28 30  ALT 29 22  ALKPHOS 66 51  PROT 6.8 5.6*  ALBUMIN 3.8 3.1*    Assessment and Plan:   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 Corliss Skains   Patient extubated and breathing on nasal cannula. He is sitting up in the chair in the room, able to move the left side of his body but not the right. He followed all commands and is able to speak in 2-3 word sentences.   TEE pending. No on Aspirin 81 mg daily and Brilinta 90 mg BID.   IR will continue to follow.    Electronically Signed: Alwyn Ren, AGACNP-BC [phone] 07/26/2022, 3:23 PM   I spent a total of 15 Minutes at the the patient's bedside AND on the patient's hospital floor or unit, greater than 50% of which was counseling/coordinating care s/p L anterior cerebral artery thrombectomy

## 2022-07-26 NOTE — Progress Notes (Signed)
EEG complete - results pending 

## 2022-07-26 NOTE — Progress Notes (Signed)
eLink Physician-Brief Progress Note Patient Name: Joseph Bright DOB: February 08, 1976 MRN: [id number]   Date of Service  07/26/2022  HPI/Events of Note  Patient had a seizure lasting 1.5 minutes, his blood pressure is now controlled with the addition of a Labetalol infusion.   eICU Interventions  Will defer to Neurology attending (who is assessing the patient now) regarding treatment for the seizure and decision about whether to obtain a head CT in the context of the seizure.        Thomasene Lot Bobbye Reinitz 07/26/2022, 2:29 AM

## 2022-07-26 NOTE — Progress Notes (Signed)
Notified by nursing about brief focal seizure of RUE shaking followed by worsening aphasia for 1-2 min, self-resolved, exam otherwise returned to baseline  Exam: Alert, orients to examiner.  Follows simple commands such as sticking out his tongue, but nonverbal otherwise.  Does not shake/nod his head accurately to questions although he does shake and nod his head  Pupils equal round reactive to light.  Left gaze preference.  Right facial droop.  Tongue midline  Right arm trace 1/5 movement.  No clear movement in the right leg on exam but he does appear to have moved it off the bed.  No drift on the left upper and lower.  No response to severe noxious stimulation on the right arm and leg, responsive to touch on the left side  - STAT Head CT - Keppra 1000 mg load, then 500 mg BID. Adjust as needed for renal function:  Estimated Creatinine Clearance: 31.6 mL/min (A) (by C-G formula based on SCr of 3.6 mg/dL (H)).   CrCl 80 to 130 mL/minute/1.73 m2: 500 mg to 1.5 g every 12 hours.  CrCl 50 to <80 mL/minute/1.73 m2: 500 mg to 1 g every 12 hours.  CrCl 30 to <50 mL/minute/1.73 m2: 250 to 750 mg every 12 hours.  CrCl 15 to <30 mL/minute/1.73 m2: 250 to 500 mg every 12 hours.  CrCl <15 mL/minute/1.73 m2: 250 to 500 mg every 24 hours (expert opinion). - Check electrolytes including POC glucose (145), Mg, CMP, CBC - At time time will monitor clinically but if there is recurrent seizure activity low threshold for LTM EEG  Brooke Dare MD-PhD [facility] [phone]  Available 7 PM to 7 AM, outside of these hours please call Neurologist on call as listed on Amion.  30 min critical care

## 2022-07-26 NOTE — Progress Notes (Signed)
Dr. Iver Nestle notified of second seizure occurring at 0319 while at CT. Same presentation as earlier with RUE shaking. Ambu bag available. Sp02 remained stable. Patient's head protected.

## 2022-07-26 NOTE — Progress Notes (Addendum)
STROKE TEAM PROGRESS NOTE   INTERVAL HISTORY Patient is seen in his room with his wife at the bedside.  Yesterday, he was admitted after sudden onset aphasia and right-sided weakness.  He was brought to the ED and given TNK to treat stroke, and CTA showed left ACA occlusion.  Patient was taken to IR and TICI 3 flow was achieved in left ACA.  Unfortunately, this reoccluded and rescue stenting was performed.  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  Vitals:   07/26/22 0800 07/26/22 1000 07/26/22 1100 07/26/22 1200  BP: 100/61 136/81 (!) 142/100 (!) 149/89  Pulse: 75 73 95 89  Resp: 12 15 12 17   Temp: 98 F (36.7 C)   98.3 F (36.8 C)  TempSrc: Axillary   Oral  SpO2: 99% 99% 100% 96%  Weight:      Height:       CBC:  Recent Labs  Lab 07/25/22 0718 07/25/22 0723 07/26/22 0500  WBC 4.2  --  14.8*  NEUTROABS 2.2  --  12.8*  HGB 13.3 13.9 13.1  HCT 39.4 41.0 37.1*  MCV 93.1  --  92.1  PLT 206  --  236   Basic Metabolic Panel:  Recent Labs  Lab 07/25/22 0718 07/25/22 0723 07/26/22 0500 07/26/22 0545  NA 140 141  --  135  K 4.4 4.4  --  5.0  CL 109 111  --  110  CO2 18*  --   --  13*  GLUCOSE 131* 127*  --  124*  BUN 36* 36*  --  40*  CREATININE 3.16* 3.60*  --  3.41*  CALCIUM 9.1  --   --  8.6*  MG  --   --  1.7  --    Lipid Panel:  Recent Labs  Lab 07/26/22 0545  CHOL 160  TRIG 443*  471*  HDL 29*  CHOLHDL 5.5  VLDL UNABLE TO CALCULATE IF TRIGLYCERIDE OVER 400 mg/dL  LDLCALC UNABLE TO CALCULATE IF TRIGLYCERIDE OVER 400 mg/dL   [id number]:  Recent Labs  Lab 07/25/22 1153  HGBA1C 5.6   Urine Drug Screen:  Recent Labs  Lab 07/26/22 0600  LABOPIA NONE DETECTED  COCAINSCRNUR NONE DETECTED  LABBENZ NONE DETECTED  AMPHETMU NONE DETECTED  THCU NONE DETECTED  LABBARB NONE DETECTED    Alcohol Level  Recent Labs  Lab 07/25/22 0718  ETH <10    IMAGING past 24 hours EEG adult  Result Date:  07/26/2022 Charlsie Quest, MD     07/26/2022  9:52 AM Patient Name: Joseph Bright MRN: [id number] Epilepsy Attending: Charlsie Quest Referring Physician/Provider: Gordy Councilman, MD Date: 07/26/2022 Duration: 23.52 mins Patient history: 47 yo M with brief focal seizure of RUE shaking followed by worsening aphasia for 1-2 min, self-resolved, exam otherwise returned to baseline. EEG to evaluate for seizure Level of alertness: Awake, asleep AEDs during EEG study: LEV Technical aspects: This EEG study was done with scalp electrodes positioned according to the 10-20 International system of electrode placement. Electrical activity was reviewed with band pass filter of 1-70Hz , sensitivity of 7 uV/mm, display speed of 37mm/sec with a 60Hz  notched filter applied as appropriate. EEG data were recorded continuously and digitally stored.  Video monitoring was available and reviewed as appropriate. Description: During awake state, no clear posterior dominant rhythm seen.  Sleep was characterized by sleep spindles (12 to 14 Hz), maximal frontocentral region.  EEG showed continuous generalized and lateralized left hemisphere  3-6 Hz theta- delta slowing.  Hyperventilation and photic stimulation were not performed.   ABNORMALITY -Continuous slow, generalized and lateralized left hemisphere IMPRESSION: This study is suggestive of cortical dysfunction arising from left hemisphere likely secondary to underlying structural abnormality.  Additionally there is moderate diffuse encephalopathy, nonspecific etiology.  No seizures or epileptiform discharges were seen throughout the recording. Please note lack of epileptiform abnormality during interictal EEG does not exclude the diagnosis of epilepsy. Charlsie Quest   MR ANGIO HEAD WO CONTRAST  Result Date: 07/26/2022 CLINICAL DATA:  47 year old male code stroke presentation yesterday with left ACA A2 segment occlusion. Status post TNK. Status post endovascular revascularization of  the left A2 segment, including rescue stenting. EXAM: MRA HEAD WITHOUT CONTRAST TECHNIQUE: Angiographic images of the Circle of Willis were acquired using MRA technique without intravenous contrast. COMPARISON:  MRI today.  CTA head and neck yesterday. FINDINGS: Anterior circulation: Antegrade flow in both ICA siphons. No siphon stenosis. Patent carotid termini. MCA and ACA origins remain normal. Bilateral MCA M1 segments, MCA bifurcations and visible bilateral MCA branches appear stable and within normal limits. Both ACA A1 segments in the anterior communicating arteries are patent, within normal limits. There is an 8 mm segment of pronounced left ACA proximal A2 attenuation which is favored to be vascular stent artifact in this setting. There is maintained ACA antegrade flow signal distal to that segment. No major left ACA branch occlusion is identified. Contralateral right ACA branches are within normal limits. Posterior circulation: Antegrade flow in the posterior circulation. Vertebrobasilar tortuosity without stenosis redemonstrated. Mild to moderate irregularity and stenosis of the left PCA P2 segment is stable on series 455, image 7. Other PCA branches are within normal limits. Anatomic variants: None. Other: MRI today reported separately. IMPRESSION: 1. Left ACA proximal A2 segment is attenuated, but likely due to vascular stent artifact. Preserved distal left ACA flow signal. 2. Otherwise negative Anterior circulation. 3. Stable Left PCA P2 segment mild to moderate irregularity and stenosis. 4. MRI today reported separately. Electronically Signed   By: Odessa Fleming M.D.   On: 07/26/2022 09:44   MR BRAIN WO CONTRAST  Result Date: 07/26/2022 CLINICAL DATA:  47 year old male code stroke presentation yesterday with left ACA A2 segment occlusion. Status post TNK. Status post endovascular revascularization of the left A2 segment. EXAM: MRI HEAD WITHOUT CONTRAST TECHNIQUE: Multiplanar, multiecho pulse sequences of  the brain and surrounding structures were obtained without intravenous contrast. COMPARISON:  Head CT, CTA head and neck yesterday. FINDINGS: Brain: Widespread restricted diffusion in the left ACA territory, confluent along the left cingulate gyrus, superior frontal gyrus. See coronal DWI images. Distal ACA territory involvement also at the posterior MCA/ACA watershed. And there are superimposed scattered mostly punctate areas of restricted diffusion elsewhere in the left MCA, left greater than right PCA, and occasionally also the right MCA (series 3, image 18) and cerebellar (image 12) vascular territories. Cytotoxic edema in the affected areas. No hemorrhagic transformation is identified. But also superimposed cystic encephalomalacia in the body of the corpus callosum (series 6, image 13). Chronic appearing right greater than left thalamic lacunar infarcts (series 7, image 14). Basal ganglia relatively spared. No chronic cortical encephalomalacia is identified. Brainstem remains normal. Partially empty sella. No midline shift, mass effect, evidence of mass lesion, ventriculomegaly, extra-axial collection. Cervicomedullary junction within normal limits. Vascular: Major intracranial vascular flow voids are preserved. Intracranial MRA is reported separately today. Skull and upper cervical spine: Negative visible cervical spine. Visualized bone marrow signal is  within normal limits. Sinuses/Orbits: Negative orbits. Paranasal sinuses and mastoids are stable and well aerated. Other: Visible internal auditory structures appear normal. IMPRESSION: 1. Confluent Left ACA territory infarct. Cytotoxic edema with no hemorrhagic transformation. 2. Multiple additional scattered mostly punctate ischemic foci in the left greater than right MCA, PCA, and occasionally cerebellar artery territories. No significant intracranial mass effect. 3. 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 4. See  intracranial MRA is reported separately today. Electronically Signed   By: Odessa Fleming M.D.   On: 07/26/2022 09:39   CT HEAD WO CONTRAST ( )  Result Date: 07/26/2022 CLINICAL DATA:  Stroke follow-up EXAM: CT HEAD WITHOUT CONTRAST TECHNIQUE: Contiguous axial images were obtained from the base of the skull through the vertex without intravenous contrast. RADIATION DOSE REDUCTION: This exam was performed according to the departmental dose-optimization program which includes automated exposure control, adjustment of the mA and/or kV according to patient size and/or use of iterative reconstruction technique. COMPARISON:  None Available. FINDINGS: Motion degraded study. Brain: There is no mass, hemorrhage or extra-axial collection. The size and configuration of the ventricles and extra-axial CSF spaces are normal. 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 There is periventricular hypoattenuation compatible with chronic microvascular disease. Vascular: Anterior cerebral artery stent. Skull: The visualized skull base, calvarium and extracranial soft tissues are normal. Sinuses/Orbits: Mild maxillary sinus mucosal thickening. The orbits are normal. IMPRESSION: 1. Motion degraded study without acute intracranial abnormality. 2. 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 Electronically Signed   By: Deatra Robinson M.D.   On: 07/26/2022 03:48   ECHOCARDIOGRAM COMPLETE  Result Date: 07/25/2022    ECHOCARDIOGRAM REPORT   Patient Name:   [facility] Date of Exam: 07/25/2022 Medical Rec #:  [id number]      Height:       69.0 in Accession #:    [id number]     Weight:       252.2 lb Date of Birth:  Dec 04, 1975      BSA:          2.280 m Patient Age:    46 years       BP:           104/70 mmHg Patient Gender: M              HR:           58 bpm. Exam Location:  Inpatient Procedure: 2D Echo, 3D Echo, Cardiac Doppler, Color Doppler and Strain Analysis Indications:    Stroke  History:        Patient has no  prior history of Echocardiogram examinations.                 Risk Factors:Hypertension and Dyslipidemia.  Sonographer:    Sheralyn Boatman RDCS Referring Phys: [id number] ASHISH ARORA  Sonographer Comments: Patient is obese and echo performed with patient supine and on artificial respirator. Image acquisition challenging due to patient body habitus. IMPRESSIONS  1. Left ventricular ejection fraction, by estimation, is 70 to 75%. The left ventricle has hyperdynamic function. The left ventricle has no regional wall motion abnormalities. There is severe asymmetric left ventricular hypertrophy of the septal segment. Left ventricular diastolic parameters are consistent with Grade I diastolic dysfunction (impaired relaxation). The average left ventricular global longitudinal strain is -22.0 %. The global longitudinal strain is normal.  2. Right ventricular systolic function is normal. The right ventricular size is mildly enlarged.  3. The mitral valve is  normal in structure. No evidence of mitral valve regurgitation. No evidence of mitral stenosis.  4. The aortic valve is tricuspid. Aortic valve regurgitation is not visualized.  5. The inferior vena cava is normal in size with greater than 50% respiratory variability, suggesting right atrial pressure of 3 mmHg. Comparison(s): No prior Echocardiogram. 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 FINDINGS  Left Ventricle: Greatest hypertrophy 18 mm Basal Septal on PSAX imaging. No resting systolic anterior motion of the mitral valve. Abnroaml basal septal strain with normal GLS. Left ventricular ejection fraction, by estimation, is 70 to 75%. The left ventricle has hyperdynamic function. The left ventricle has no regional wall motion abnormalities. The average left ventricular global longitudinal strain is -22.0 %. The global longitudinal strain is normal.  The left ventricular internal cavity size was  normal in size. There is severe asymmetric left ventricular hypertrophy of the septal segment. Left ventricular diastolic parameters are consistent with Grade I diastolic dysfunction (impaired relaxation). Right Ventricle: The right ventricular size is mildly enlarged. No increase in right ventricular wall thickness. Right ventricular systolic function is normal. Left Atrium: Left atrial size was normal in size. Right Atrium: Right atrial size was normal in size. Pericardium: There is no evidence of pericardial effusion. Mitral Valve: The mitral valve is normal in structure. No evidence of mitral valve regurgitation. No evidence of mitral valve stenosis. Tricuspid Valve: The tricuspid valve is normal in structure. Tricuspid valve regurgitation is not demonstrated. No evidence of tricuspid stenosis. Aortic Valve: The aortic valve is tricuspid. Aortic valve regurgitation is not visualized. Pulmonic Valve: The pulmonic valve was normal in structure. Pulmonic valve regurgitation is trivial. No evidence of pulmonic stenosis. Aorta: The aortic root and ascending aorta are structurally normal, with no evidence of dilitation. Venous: The inferior vena cava is normal in size with greater than 50% respiratory variability, suggesting right atrial pressure of 3 mmHg. IAS/Shunts: No atrial level shunt detected by color flow Doppler.  LEFT VENTRICLE PLAX 2D LVIDd:         4.00 cm   Diastology LVIDs:         1.70 cm   LV e' medial:    7.07 cm/s LV PW:         2.10 cm   LV E/e' medial:  10.8 LV IVS:        1.60 cm   LV e' lateral:   5.77 cm/s LVOT diam:     2.30 cm   LV E/e' lateral: 13.3 LV SV:         109 LV SV Index:   48        2D Longitudinal Strain LVOT Area:     4.15 cm  2D Strain GLS Avg:     -22.0 %  RIGHT VENTRICLE             IVC RV S prime:     16.10 cm/s  IVC diam: 1.50 cm TAPSE (M-mode): 2.3 cm LEFT ATRIUM           Index        RIGHT ATRIUM          Index LA diam:       3.50 cm 1.54 cm/m   RA Area:     8.70 cm LA Vol (A2C): 54.7 ml 23.99 ml/m  RA Volume:   16.60 ml 7.28 ml/m LA Vol (A4C): 17.5 ml 7.68 ml/m  AORTIC VALVE  PULMONIC VALVE LVOT Vmax:   134.00 cm/s PR End Diast Vel: 1.58 msec LVOT Vmean:  83.600 cm/s LVOT VTI:    0.263 m  AORTA Ao Root diam: 3.30 cm Ao Asc diam:  3.20 cm MITRAL VALVE MV Area (PHT): 3.12 cm    SHUNTS MV Decel Time: 243 msec    Systemic VTI:  0.26 m MV E velocity: 76.50 cm/s  Systemic Diam: 2.30 cm MV A velocity: 80.40 cm/s MV E/A ratio:  0.95 Riley Lam MD Electronically signed by Riley Lam MD Signature Date/Time: 07/25/2022/3:49:04 PM    Final     PHYSICAL EXAM General: Drowsy, well-nourished, well-developed patient in no acute distress Respiratory: Regular, unlabored respirations on supplemental O2 Neurological: Patient is drowsy and responds to name, is able to repeat 3 word sentences but is largely nonverbal he is able to follow midline commands intermittently.  Patient blinks to threat on the left but not of the right hide has slight right-sided facial droop and moves left upper extremity and left lower extremity with good antigravity strength.  Slight withdrawal of the right upper and right lower extremities to noxious stimuli seen  ASSESSMENT/PLAN Joseph Bright is a 47 y.o. male with history of hypertension and back pain presenting with sudden onset aphasia and right-sided weakness.  He was brought to the ED and given TNK to treat stroke, and CTA showed left ACA occlusion.  Patient was taken to IR and TICI 3 flow was achieved in left ACA.  Unfortunately, this reoccluded and rescue stenting was performed.  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 Small vessel  disease. ASPECTS 10.    CTA head & neck acute occlusion of left ACA A2 segment IR - left A2 occlusion with TICI3 and reocclusion s/p stenting MRI confluent left ACA territory infarct with multiple scattered punctate ischemic foci in right MCA PCA and cerebellar artery territories MRA attenuated left ACA proximal segment, likely due to vascular stent artifact 2D Echo EF 70 to 75% TEE pending Friday Consider loop recorder if workup negative prior to discharge LDL pending HgbA1c 5.6 VTE prophylaxis -SQH No antithrombotic prior to admission, now on aspirin 81 mg daily and Brilinta (ticagrelor) 90 mg bid due to stent placement Therapy recommendations: CIR Disposition: Ending  Hypertension Home meds: Amlodipine 10 mg daily, hydralazine 50 mg twice daily, labetalol 200 mg twice daily Stable Keep systolic blood pressure less than 180 Long-term BP goal normotensive  Hyperlipidemia Home meds: Simvastatin 20 mg daily, not compliant LDL pending, goal < 70 Now on Crestor 20 Continue statin at discharge  Seizure like activity Patient had focal seizure overnight consisting of right upper extremity shaking followed by worsening aphasia for about 2 minutes EEG shows no seizures or epileptiform discharges Continue Keppra 500 mg twice daily  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 Pt was seen and examined.   Wife and best  friend are at bedside.  Patient lying in bed, extubated yesterday afternoon, currently drowsy sleepy but able to open eyes briefly with voice.  Patient seem able to follow some midline commands, however not peripheral commands.  Able to repeat 3 word sentences, not able to name, not quite follow commands but able to mimic.  Still has right hemiplegia with only slight withdrawal on pain stimulation.  Able to spontaneous against gravity on the left.  Etiology for patient left ACA stroke not quite clear, likely large vessel disease given risk factors or cardioembolic source.  Plan for TEE on Friday and possible loop if workup negative.  Continue aspirin Brilinta DAPT post stenting.  Add Crestor 20.  Overnight with right arm shaking, seizure-like activity, put on Keppra.  EEG no seizure.  PT and OT recommend CIR.  For detailed assessment and plan, please refer to above/below as I have made changes wherever appropriate.   Marvel Plan, MD PhD Stroke Neurology 07/26/2022 5:53 PM  This patient is critically ill due to left ACA infarct, status post TNK and thrombectomy, chronic kidney disease, seizure-like activity and at significant risk of neurological worsening, death form recurrent stroke, hemorrhagic transformation, renal failure, status epilepticus. This patient's care requires constant monitoring of vital signs, hemodynamics, respiratory and cardiac monitoring, review of multiple databases, neurological assessment, discussion with family, other specialists and medical decision making of high complexity. I spent 45 minutes of neurocritical care time in the care of this patient. I had long discussion with wife at bedside, updated pt current condition, treatment plan and potential prognosis, and answered all the questions.  She expressed understanding and appreciation.      To contact Stroke Continuity provider, please refer to [email or web address]. After hours, contact General Neurology

## 2022-07-26 NOTE — Procedures (Signed)
Cortrak  Person Inserting Tube:  Dream Harman D, RD Tube Type:  Cortrak - 43 inches Tube Size:  10 Tube Location:  Left nare Secured by: Bridle Technique Used to Measure Tube Placement:  Marking at nare/corner of mouth Cortrak Secured At:  67 cm Procedure Comments:  Cortrak Tube Team Note:  Consult received to place a Cortrak feeding tube.   X-ray is required, abdominal x-ray has been ordered by the Cortrak team. Please confirm tube placement before using the Cortrak tube.   If the tube becomes dislodged please keep the tube and contact the Cortrak team at [email or web address] for replacement.  If after hours and replacement cannot be delayed, place a NG tube and confirm placement with an abdominal x-ray.    Nicholaus Steinke, RD, LDN Clinical Dietitian RD pager # available in AMION  After hours/weekend pager # available in AMION    

## 2022-07-26 NOTE — Procedures (Signed)
Patient Name: Joseph Bright  MRN: [id number]  Epilepsy Attending: Charlsie Quest  Referring Physician/Provider: Gordy Councilman, MD  Date: 07/26/2022 Duration: 23.52 mins  Patient history: 47 yo M with brief focal seizure of RUE shaking followed by worsening aphasia for 1-2 min, self-resolved, exam otherwise returned to baseline. EEG to evaluate for seizure  Level of alertness: Awake, asleep  AEDs during EEG study: LEV  Technical aspects: This EEG study was done with scalp electrodes positioned according to the 10-20 International system of electrode placement. Electrical activity was reviewed with band pass filter of 1-70Hz , sensitivity of 7 uV/mm, display speed of 68mm/sec with a 60Hz  notched filter applied as appropriate. EEG data were recorded continuously and digitally stored.  Video monitoring was available and reviewed as appropriate.  Description: During awake state, no clear posterior dominant rhythm seen.  Sleep was characterized by sleep spindles (12 to 14 Hz), maximal frontocentral region.  EEG showed continuous generalized and lateralized left hemisphere 3-6 Hz theta- delta slowing.  Hyperventilation and photic stimulation were not performed.     ABNORMALITY -Continuous slow, generalized and lateralized left hemisphere  IMPRESSION: This study is suggestive of cortical dysfunction arising from left hemisphere likely secondary to underlying structural abnormality.  Additionally there is moderate diffuse encephalopathy, nonspecific etiology.  No seizures or epileptiform discharges were seen throughout the recording.  Please note lack of epileptiform abnormality during interictal EEG does not exclude the diagnosis of epilepsy.  Joseph Bright

## 2022-07-26 NOTE — Progress Notes (Signed)
OT Cancellation Note  Patient Details Name: Joseph Bright MRN: [id number] DOB: [birth year]   Cancelled Treatment:    Reason Eval/Treat Not Completed: Patient not medically ready;Active bedrest order  Will check back as able to proceed with eval as pt is on bedrest until potentially 5:00 today.  May be seen in the am tomorrow based on bedrest order not being lifted until late afternoon.    Perrin Maltese, OTR/L Acute Rehabilitation Services  Office [phone] 07/26/2022

## 2022-07-26 NOTE — Evaluation (Signed)
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  PMH of HTN, sleep apnea.   Clinical Impression   Pt currently at max assist for transition to the EOB with max +2 for sit to stand with simulated LB selfcare and stand/squat pivot transfers.  Increased receptive and expressive deficits with decreased consistency answering yes/no questions with head nods.  He exhibited better ability to follow demonstrational cueing along with instructional cueing.  Per discussion with his spouse over the phone she works from home and can provide almost 24 hour supervision except having to take their 100 year old daughter to school and pick her up.  They do live in a second floor apartment with approximately 14 steps to get up.  Feel pt will benefit from acute care OT at this time and he will benefit from intensive inpatient follow up therapy, >3 hours/day.      Recommendations for follow up therapy are one component of a multi-disciplinary discharge planning process, led by the attending physician.  Recommendations may be updated based on patient status, additional functional criteria and insurance authorization.   Assistance Recommended at Discharge Frequent or constant Supervision/Assistance  Patient can return home with the following A lot of help with walking and/or transfers;A lot of help with bathing/dressing/bathroom;Assistance with feeding;Assist for transportation;Help with stairs or ramp for entrance;Direct supervision/assist for medications management;Assistance with cooking/housework;Direct supervision/assist  for financial management    Functional Status Assessment  Patient has had a recent decline in their functional status and demonstrates the ability to make significant improvements in function in a reasonable and predictable amount of time.  Equipment Recommendations  Other (comment) (TBD next venue of care)    Recommendations for Other Services Rehab consult     Precautions / Restrictions Precautions Precautions: Fall Precaution Comments: right sided hemiparesis, receptive/expressive deficits Restrictions Weight Bearing Restrictions: No      Mobility Bed Mobility Overal bed mobility: Needs Assistance Bed Mobility: Supine to Sit     Supine to sit: Max assist     General bed mobility comments: Assist for bringing RLE off the bed and bringing trunk up to sitting    Transfers Overall transfer level: Needs assistance Equipment used: None Transfers: Sit to/from Stand, Bed to chair/wheelchair/BSC Sit to Stand: Max assist, +2 safety/equipment   Squat pivot transfers: Max assist, +2 physical assistance       General transfer comment: Decreased activation noted in the right knee and hip with standing requiring total assist to maintain extension.      Balance Overall balance assessment: Needs assistance   Sitting balance-Leahy Scale: Poor Sitting balance - Comments: LOB to the right frequently requiring min to mod assist to correct   Standing balance support: During functional activity Standing balance-Leahy Scale: Poor Standing balance comment: Max +2 for standing                           ADL either performed or assessed with clinical judgement   ADL Overall ADL's : Needs assistance/impaired     Grooming: Wash/dry face;Sitting Grooming Details (indicate cue type and reason): min assist with decreased thoroughness  Toilet Transfer: Maximal assistance;+2 for physical assistance;Stand-pivot Toilet Transfer Details (indicate cue type  and reason): simulated to the recliner Toileting- Clothing Manipulation and Hygiene: Sit to/from stand;Maximal assistance Toileting - Clothing Manipulation Details (indicate cue type and reason): simulated standing     Functional mobility during ADLs: Maximal assistance;+2 for safety/equipment (sit to stand and for squat pivot transfer) General ADL Comments: Pt with decreased ability to verbalize or understand questions about orientation or if he wore glasses before.  He did better following functional commands with verbal and visual cueing such as wash your face or reach down and pull up you sock.  Vitals stable with BP at 128/86, O2 at 95% and HR at 75 BPM.  Per spouse she can provide almost 24 hr supervision but has to leave to take their 55 yo daughter to school and pick her up while working from home.     Vision Ability to See in Adequate Light: 0 Adequate Patient Visual Report: Other (comment) (pt unable to state) Vision Assessment?: Yes Eye Alignment: Within Functional Limits Ocular Range of Motion: Within Functional Limits Tracking/Visual Pursuits: Able to track stimulus in all quads without difficulty Convergence: Within functional limits Additional Comments: Pt able to track therapist's finger in all quadrants.  Unable to determine peripheral fields secondary to receptive deficits     Perception  [facility]   Praxis  [facility]    Pertinent Vitals/Pain Pain Assessment Pain Assessment: Faces Pain Score: 0-No pain Faces Pain Scale: No hurt     Hand Dominance Right   Extremity/Trunk Assessment Upper Extremity Assessment Upper Extremity Assessment: RUE deficits/detail RUE Deficits / Details: Brunnstrum stage I in the arm with no active movement noted in the arm or digits. RUE Sensation:  (unable to determine secondary to global communication deficits) RUE Coordination: decreased fine motor;decreased gross motor   Lower Extremity Assessment Lower Extremity Assessment: Defer to PT  evaluation   Cervical / Trunk Assessment Cervical / Trunk Assessment: Normal   Communication Communication Communication: Expressive difficulties   Cognition Arousal/Alertness: Awake/alert Behavior During Therapy: WFL for tasks assessed/performed Overall Cognitive Status: Impaired/Different from baseline Area of Impairment: Following commands                       Following Commands: Follows one step commands inconsistently       General Comments: Pt non-verbal and demonstrated inconsistency with following verbal instructional commands.  He was able to nod his head "yes/no" to when given verbal and visual cueing but then was inconsistent nodding his head yes to almost all questions asked.                Home Living Family/patient expects to be discharged to:: Private residence Living Arrangements: Spouse/significant other (6 yr old daughter) Available Help at Discharge: Family;Available 24 hours/day;Available PRN/intermittently (she has 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  No other family around to help) Type of Home: Apartment Home Access: Stairs to enter [employer] of Steps: 15 Entrance Stairs-Rails: Left;Right Home Layout: One level     Bathroom Shower/Tub: Chief Strategy Officer: Standard Bathroom Accessibility: Yes How Accessible: Accessible via walker;Accessible via wheelchair Home Equipment: None          Prior Functioning/Environment Prior Level of Function : Independent/Modified Independent;Driving  OT Problem List: Decreased strength;Decreased knowledge of use of DME or AE;Impaired tone;Obesity;Decreased coordination;Decreased range of motion;Decreased cognition;Impaired UE functional use;Impaired balance (sitting and/or standing);Decreased safety awareness;Impaired sensation;Pain      OT Treatment/Interventions:  Self-care/ADL training;Therapeutic exercise;Patient/family education;Visual/perceptual remediation/compensation;Balance training;Neuromuscular education;Therapeutic activities;DME and/or AE instruction;Cognitive remediation/compensation    OT Goals(Current goals can be found in the care plan section) Acute Rehab OT Goals Patient Stated Goal: Pt unable to state Time For Goal Achievement: 08/09/22 Potential to Achieve Goals: Good  OT Frequency: Min 2X/week    Co-evaluation PT/OT/SLP Co-Evaluation/Treatment: Yes Reason for Co-Treatment: Complexity of the patient's impairments (multi-system involvement);For patient/therapist safety   OT goals addressed during session: ADL's and self-care      AM-PAC OT "6 Clicks" Daily Activity     Outcome Measure Help from another person eating meals?: Total Help from another person taking care of personal grooming?: A Lot Help from another person toileting, which includes using toliet, bedpan, or urinal?: Total Help from another person bathing (including washing, rinsing, drying)?: Total Help from another person to put on and taking off regular upper body clothing?: A Lot Help from another person to put on and taking off regular lower body clothing?: Total 6 Click Score: 8   End of Session Equipment Utilized During Treatment: Gait belt Nurse Communication: Need for lift equipment  Activity Tolerance: Patient tolerated treatment well Patient left: in chair;with call bell/phone within reach;with chair alarm set  OT Visit Diagnosis: Unsteadiness on feet (R26.81);Other abnormalities of gait and mobility (R26.89);Muscle weakness (generalized) (M62.81);Hemiplegia and hemiparesis;Other symptoms and signs involving cognitive function;Cognitive communication deficit (R41.841) Symptoms and signs involving cognitive functions: Cerebral infarction Hemiplegia - Right/Left: Right Hemiplegia - dominant/non-dominant: Dominant Hemiplegia - caused by: Cerebral  infarction                Time: 1419-1450 OT Time Calculation (min): 31 min Charges:  OT General Charges $OT Visit: 1 Visit OT Evaluation $OT Eval Moderate Complexity: 1 Mod Perrin Maltese, OTR/L Acute Rehabilitation Services  Office [phone] 07/26/2022

## 2022-07-26 NOTE — Progress Notes (Signed)
Initial Nutrition Assessment  DOCUMENTATION CODES:   Obesity unspecified  INTERVENTION:   Initiate tube feeding via Cortrak tube: Osmolite 1.5 at 60 ml/h (1440 ml per day) Prosource TF20 60 ml daily   Provides 2240 kcal, 110 gm protein, 1094 ml free water daily  Pt is a PEPuP candidate   NUTRITION DIAGNOSIS:   Inadequate oral intake related to dysphagia as evidenced by NPO status.  GOAL:   Patient will meet greater than or equal to 90% of their needs  MONITOR:   Diet advancement, TF tolerance  REASON FOR ASSESSMENT:   Consult Enteral/tube feeding initiation and management  ASSESSMENT:   Pt with PMH of HTN and back pain admitted with L ACA stroke s/p TNK and mechanical thrombectomy with rescue stenting after reocclusion.   04/30 - extubated  05/01 - failed swallow eval, s/p cortrak placement; xray pending  Medications reviewed and include: colace, miralax, N[place]icarb TID Cleviprex @ 64 ml/hr provides 3072 kcal  Keppra  Labs reviewed:  BUN 40 Cr 3.41 A1C 5.6   NUTRITION - FOCUSED PHYSICAL EXAM:  Deferred; RD working remotely   Diet Order:   Diet Order             Diet NPO time specified Except for: Sips with Meds, Ice Chips  Diet effective now                   EDUCATION NEEDS:   No education needs have been identified at this time  Skin:  Skin Assessment: Reviewed RN Assessment  Last BM:  unknown  Height:   Ht Readings from Last 1 Encounters:  07/25/22 5\' 9"  (1.753 m)    Weight:   Wt Readings from Last 1 Encounters:  07/25/22 114.4 kg   BMI:  Body mass index is 37.24 kg/m.  Estimated Nutritional Needs:   Kcal:  2100-2300  Protein:  105-115 grams  Fluid:  >2.1 L/day  Cammy Copa., RD, LDN, CNSC See AMiON for contact information

## 2022-07-27 ENCOUNTER — Ambulatory Visit (HOSPITAL_COMMUNITY): Admit: 2022-07-27 | Payer: Managed Care, Other (non HMO) | Admitting: Cardiovascular Disease

## 2022-07-27 ENCOUNTER — Other Ambulatory Visit (HOSPITAL_COMMUNITY): Payer: Self-pay

## 2022-07-27 ENCOUNTER — Telehealth (HOSPITAL_COMMUNITY): Payer: Self-pay | Admitting: Pharmacy Technician

## 2022-07-27 DIAGNOSIS — I5032 Chronic diastolic (congestive) heart failure: Secondary | ICD-10-CM

## 2022-07-27 DIAGNOSIS — I10 Essential (primary) hypertension: Secondary | ICD-10-CM

## 2022-07-27 DIAGNOSIS — I63522 Cerebral infarction due to unspecified occlusion or stenosis of left anterior cerebral artery: Secondary | ICD-10-CM | POA: Diagnosis not present

## 2022-07-27 LAB — CBC
HCT: 41.3 % (ref 39.0–52.0)
Hemoglobin: 13.2 g/dL (ref 13.0–17.0)
MCH: 30.3 pg (ref 26.0–34.0)
MCHC: 32 g/dL (ref 30.0–36.0)
MCV: 94.9 fL (ref 80.0–100.0)
Platelets: 209 10*3/uL (ref 150–400)
RBC: 4.35 MIL/uL (ref 4.22–5.81)
RDW: 14.1 % (ref 11.5–15.5)
WBC: 8.7 10*3/uL (ref 4.0–10.5)
nRBC: 0 % (ref 0.0–0.2)

## 2022-07-27 LAB — GLUCOSE, CAPILLARY
Glucose-Capillary: 120 mg/dL — ABNORMAL HIGH (ref 70–99)
Glucose-Capillary: 115 mg/dL — ABNORMAL HIGH (ref 70–99)
Glucose-Capillary: 126 mg/dL — ABNORMAL HIGH (ref 70–99)
Glucose-Capillary: 92 mg/dL (ref 70–99)
Glucose-Capillary: 129 mg/dL — ABNORMAL HIGH (ref 70–99)
Glucose-Capillary: 103 mg/dL — ABNORMAL HIGH (ref 70–99)

## 2022-07-27 LAB — BASIC METABOLIC PANEL
Anion gap: 9 (ref 5–15)
BUN: 41 mg/dL — ABNORMAL HIGH (ref 6–20)
CO2: 22 mmol/L (ref 22–32)
Calcium: 9 mg/dL (ref 8.9–10.3)
Chloride: 109 mmol/L (ref 98–111)
Creatinine, Ser: 3.23 mg/dL — ABNORMAL HIGH (ref 0.61–1.24)
GFR, Estimated: 23 mL/min — ABNORMAL LOW (ref >60)
Glucose, Bld: 133 mg/dL — ABNORMAL HIGH (ref 70–99)
Potassium: 4.3 mmol/L (ref 3.5–5.1)
Sodium: 140 mmol/L (ref 135–145)

## 2022-07-27 LAB — LIPID PANEL
Cholesterol: 165 mg/dL (ref 0–200)
HDL: 28 mg/dL — ABNORMAL LOW (ref >40)
LDL Cholesterol: 74 mg/dL (ref 0–99)
Total CHOL/HDL Ratio: 5.9 RATIO
Triglycerides: 313 mg/dL — ABNORMAL HIGH (ref <150)
VLDL: 63 mg/dL — ABNORMAL HIGH (ref 0–40)

## 2022-07-27 LAB — MAGNESIUM: Magnesium: 2.2 mg/dL (ref 1.7–2.4)

## 2022-07-27 LAB — PHOSPHORUS
Phosphorus: 4 mg/dL (ref 2.5–4.6)
Phosphorus: 3.9 mg/dL (ref 2.5–4.6)

## 2022-07-27 MED ORDER — ENSURE ENLIVE PO LIQD
237.0000 mL | Freq: Two times a day (BID) | ORAL | Status: DC
  Administered 2022-07-28 – 2022-08-01 (×8): 237 mL via ORAL

## 2022-07-27 MED ORDER — SODIUM BICARBONATE 650 MG PO TABS
650.0000 mg | ORAL_TABLET | Freq: Three times a day (TID) | ORAL | Status: DC
  Administered 2022-07-27 – 2022-08-01 (×16): 650 mg via ORAL
  Filled 2022-07-27 (×17): qty 1

## 2022-07-27 MED ORDER — FREE WATER
100.0000 mL | Status: DC
  Administered 2022-07-27 (×3): 100 mL

## 2022-07-27 MED ORDER — SODIUM CHLORIDE 0.9 % IV SOLN: INTRAVENOUS | Status: DC

## 2022-07-27 MED ORDER — LEVETIRACETAM 500 MG PO TABS
500.0000 mg | ORAL_TABLET | Freq: Two times a day (BID) | ORAL | Status: DC
  Administered 2022-07-27 – 2022-07-31 (×7): 500 mg
  Filled 2022-07-27 (×8): qty 1

## 2022-07-27 NOTE — H&P (View-Only) (Signed)
ASTROKE TEAM PROGRESS NOTE   INTERVAL HISTORY Patient is seen in his room with his wife at the bedside.  He was admitted after sudden onset aphasia and right-sided weakness.  He was brought to the ED and given TNK to treat stroke, and CTA showed left ACA occlusion.  Patient was taken to IR and TICI 3 flow was achieved in left ACA.  Unfortunately, this reoccluded and rescue stenting was performed.  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 Transfer out of ICU today. Neurologically improving   Vitals:   07/27/22 0500 07/27/22 0600 07/27/22 0700 07/27/22 0800  BP: (!) 141/98 136/80 (!) 148/87 (!) 144/87  Pulse: 69 60 68 80  Resp: 17 18 18 18  Temp:      TempSrc:      SpO2: 94% 95% 95% 96%  Weight:      Height:       CBC:  Recent Labs  Lab 07/25/22 0718 07/25/22 0723 07/26/22 0500 07/27/22 0624  WBC 4.2  --  14.8* 8.7  NEUTROABS 2.2  --  12.8*  --   HGB 13.3   < > 13.1 13.2  HCT 39.4   < > 37.1* 41.3  MCV 93.1  --  92.1 94.9  PLT 206  --  236 209   < > = values in this interval not displayed.    Basic Metabolic Panel:  Recent Labs  Lab 07/26/22 0500 07/26/22 0545 07/26/22 1511 07/26/22 1825 07/27/22 0624  NA  --  135  --   --  140  K  --  5.0  --   --  4.3  CL  --  110  --   --  109  CO2  --  13*  --   --  22  GLUCOSE  --  124*  --   --  133*  BUN  --  40*  --   --  41*  CREATININE  --  3.41*  --   --  3.23*  CALCIUM  --  8.6*  --   --  9.0  MG 1.7  --   --   --  2.2  PHOS  --   --    < > 4.3 4.0   < > = values in this interval not displayed.    Lipid Panel:  Recent Labs  Lab 07/27/22 0624  CHOL 165  TRIG 313*  HDL 28*  CHOLHDL 5.9  VLDL 63*  LDLCALC 74    HgbA1c:  Recent Labs  Lab 07/25/22 1153  HGBA1C 5.6    Urine Drug Screen:  Recent Labs  Lab 07/26/22 0600  LABOPIA NONE DETECTED  COCAINSCRNUR NONE DETECTED  LABBENZ NONE DETECTED  AMPHETMU NONE DETECTED  THCU NONE DETECTED  LABBARB NONE  DETECTED     Alcohol Level  Recent Labs  Lab 07/25/22 0718  ETH <10     IMAGING past 24 hours DG Abd Portable 1V  Result Date: 07/26/2022 CLINICAL DATA:  Feeding tube placement EXAM: PORTABLE ABDOMEN - 1 VIEW COMPARISON:  None Available. FINDINGS: Feeding tube tip is in the mid to distal stomach. Nonobstructive bowel gas pattern. IMPRESSION: Feeding tube tip in the stomach. Electronically Signed   By: Kevin  Dover M.D.   On: 07/26/2022 15:18   EEG adult  Result Date: 07/26/2022 Yadav, Priyanka O, MD     07/26/2022  9:52 AM Patient Name: Joseph Bright MRN: [id number] Epilepsy Attending: Priyanka O Yadav Referring Physician/Provider:   Bhagat, Srishti L, MD Date: 07/26/2022 Duration: 23.52 mins Patient history: 46 yo M with brief focal seizure of RUE shaking followed by worsening aphasia for 1-2 min, self-resolved, exam otherwise returned to baseline. EEG to evaluate for seizure Level of alertness: Awake, asleep AEDs during EEG study: LEV Technical aspects: This EEG study was done with scalp electrodes positioned according to the 10-20 International system of electrode placement. Electrical activity was reviewed with band pass filter of 1-70Hz, sensitivity of 7 uV/mm, display speed of 30mm/sec with a 60Hz notched filter applied as appropriate. EEG data were recorded continuously and digitally stored.  Video monitoring was available and reviewed as appropriate. Description: During awake state, no clear posterior dominant rhythm seen.  Sleep was characterized by sleep spindles (12 to 14 Hz), maximal frontocentral region.  EEG showed continuous generalized and lateralized left hemisphere 3-6 Hz theta- delta slowing.  Hyperventilation and photic stimulation were not performed.   ABNORMALITY -Continuous slow, generalized and lateralized left hemisphere IMPRESSION: This study is suggestive of cortical dysfunction arising from left hemisphere likely secondary to underlying structural abnormality.  Additionally  there is moderate diffuse encephalopathy, nonspecific etiology.  No seizures or epileptiform discharges were seen throughout the recording. Please note lack of epileptiform abnormality during interictal EEG does not exclude the diagnosis of epilepsy. Priyanka O Yadav   MR ANGIO HEAD WO CONTRAST  Result Date: 07/26/2022 CLINICAL DATA:  46-year-old male code stroke presentation yesterday with left ACA A2 segment occlusion. Status post TNK. Status post endovascular revascularization of the left A2 segment, including rescue stenting. EXAM: MRA HEAD WITHOUT CONTRAST TECHNIQUE: Angiographic images of the Circle of Willis were acquired using MRA technique without intravenous contrast. COMPARISON:  MRI today.  CTA head and neck yesterday. FINDINGS: Anterior circulation: Antegrade flow in both ICA siphons. No siphon stenosis. Patent carotid termini. MCA and ACA origins remain normal. Bilateral MCA M1 segments, MCA bifurcations and visible bilateral MCA branches appear stable and within normal limits. Both ACA A1 segments in the anterior communicating arteries are patent, within normal limits. There is an 8 mm segment of pronounced left ACA proximal A2 attenuation which is favored to be vascular stent artifact in this setting. There is maintained ACA antegrade flow signal distal to that segment. No major left ACA branch occlusion is identified. Contralateral right ACA branches are within normal limits. Posterior circulation: Antegrade flow in the posterior circulation. Vertebrobasilar tortuosity without stenosis redemonstrated. Mild to moderate irregularity and stenosis of the left PCA P2 segment is stable on series 455, image 7. Other PCA branches are within normal limits. Anatomic variants: None. Other: MRI today reported separately. IMPRESSION: 1. Left ACA proximal A2 segment is attenuated, but likely due to vascular stent artifact. Preserved distal left ACA flow signal. 2. Otherwise negative Anterior circulation. 3.  Stable Left PCA P2 segment mild to moderate irregularity and stenosis. 4. MRI today reported separately. Electronically Signed   By: H  Hall M.D.   On: 07/26/2022 09:44   MR BRAIN WO CONTRAST  Result Date: 07/26/2022 CLINICAL DATA:  46-year-old male code stroke presentation yesterday with left ACA A2 segment occlusion. Status post TNK. Status post endovascular revascularization of the left A2 segment. EXAM: MRI HEAD WITHOUT CONTRAST TECHNIQUE: Multiplanar, multiecho pulse sequences of the brain and surrounding structures were obtained without intravenous contrast. COMPARISON:  Head CT, CTA head and neck yesterday. FINDINGS: Brain: Widespread restricted diffusion in the left ACA territory, confluent along the left cingulate gyrus, superior frontal gyrus. See coronal DWI images. Distal ACA territory   involvement also at the posterior MCA/ACA watershed. And there are superimposed scattered mostly punctate areas of restricted diffusion elsewhere in the left MCA, left greater than right PCA, and occasionally also the right MCA (series 3, image 18) and cerebellar (image 12) vascular territories. Cytotoxic edema in the affected areas. No hemorrhagic transformation is identified. But also superimposed cystic encephalomalacia in the body of the corpus callosum (series 6, image 13). Chronic appearing right greater than left thalamic lacunar infarcts (series 7, image 14). Basal ganglia relatively spared. No chronic cortical encephalomalacia is identified. Brainstem remains normal. Partially empty sella. No midline shift, mass effect, evidence of mass lesion, ventriculomegaly, extra-axial collection. Cervicomedullary junction within normal limits. Vascular: Major intracranial vascular flow voids are preserved. Intracranial MRA is reported separately today. Skull and upper cervical spine: Negative visible cervical spine. Visualized bone marrow signal is within normal limits. Sinuses/Orbits: Negative orbits. Paranasal sinuses  and mastoids are stable and well aerated. Other: Visible internal auditory structures appear normal. IMPRESSION: 1. Confluent Left ACA territory infarct. Cytotoxic edema with no hemorrhagic transformation. 2. Multiple additional scattered mostly punctate ischemic foci in the left greater than right MCA, PCA, and occasionally cerebellar artery territories. No significant intracranial mass effect. 3. 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 4. See intracranial MRA is reported separately today. Electronically Signed   By: H  Hall M.D.   On: 07/26/2022 09:39    PHYSICAL EXAM General: Drowsy, well-nourished, well-developed patient in no acute distress Respiratory: Regular, unlabored respirations on supplemental O2 Neurological:  Awake, eyes open, states he is 48. Follows commands. Dysarthric, repetition intact, difficulty answering questions, expressive aphasia - but was able to name and read on NIHSS sheet. Patient blinks to threat on the left but not of the right hide has slight right-sided facial droop Left full movement, Right hemiplegic moves left upper extremity and left lower extremity with good antigravity strength.  Slight withdrawal of the right upper and right lower extremities to noxious stimuli seen  ASSESSMENT/PLAN [name] is a 46 y.o. male with history of hypertension and back pain presenting with sudden onset aphasia and right-sided weakness.  He was brought to the ED and given TNK to treat stroke, and CTA showed left ACA occlusion.  Patient was taken to IR and TICI 3 flow was achieved in left ACA.  Unfortunately, this reoccluded and rescue stenting was performed.  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  Small vessel disease. ASPECTS 10.    CTA head & neck acute occlusion of left ACA A2 segment IR - left A2 occlusion with TICI3 and reocclusion s/p stenting MRI confluent left ACA territory infarct with multiple scattered punctate ischemic foci in right MCA PCA and cerebellar artery territories MRA attenuated left ACA proximal segment, likely due to vascular stent artifact 2D Echo EF 70 to 75% TEE tomorrow at 0830 Consider loop recorder if workup negative prior to discharge LDL 74, TG 313 HgbA1c 5.6 VTE prophylaxis -SQH No antithrombotic prior to admission, now on aspirin 81 mg daily and Brilinta (ticagrelor) 90 mg bid due to stent placement Therapy recommendations: CIR Disposition: pending  Hypertension Home meds: Amlodipine 10 mg daily, hydralazine 50 mg twice daily, labetalol 200 mg twice daily Stable Keep systolic blood pressure less than 180 Long-term BP   goal normotensive  Hyperlipidemia Home meds: Simvastatin 20 mg daily, not compliant LDL 74, goal < 70 Now on Crestor 20 Continue statin at discharge  Seizure like activity Patient had focal seizure overnight consisting of right upper extremity shaking followed by worsening aphasia for about 2 minutes EEG shows no seizures or epileptiform discharges Continue Keppra 500 mg twice daily  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 MD to update note as needed.   Devon Shafer, DNP, FNP-BC [facility] Pager: [phone]  ATTENDING NOTE: I reviewed above note and agree with the assessment and plan. Pt  was seen and examined.   Wife at bedside.  Patient lying in bed, more awake alert, no shivering, language improved, now follows all simple commands, able to repeat simple sentence in dysarthric voice, able to name 5/7.  However still has difficult speech output, consistent with expressive aphasia.  Not blinking to be described on the right, right facial droop, right hemiplegia.  Currently on aspirin and Brilinta as well as Crestor.  Continue Keppra for now.  Plan for TEE tomorrow and if negative consider loop recorder prior to discharge.  PT and OT recommend CIR.  For detailed assessment and plan, please refer to above/below as I have made changes wherever appropriate.   Xai Frerking, MD PhD Stroke Neurology 07/27/2022 1:27 PM  This patient is critically ill due to left ACA infarct, status post TNK and thrombectomy, chronic kidney disease, seizure-like activity and at significant risk of neurological worsening, death form recurrent stroke, hemorrhagic transformation, renal failure, status epilepticus. This patient's care requires constant monitoring of vital signs, hemodynamics, respiratory and cardiac monitoring, review of multiple databases, neurological assessment, discussion with family, other specialists and medical decision making of high complexity. I spent 35 minutes of neurocritical care time in the care of this patient. I had long discussion with wife at bedside, updated pt current condition, treatment plan and potential prognosis, and answered all the questions.  She expressed understanding and appreciation.    To contact Stroke Continuity provider, please refer to [email or web address]. After hours, contact General Neurology  

## 2022-07-27 NOTE — Progress Notes (Signed)
Cpap on standby in room, pt does not want to use at this time.

## 2022-07-27 NOTE — Progress Notes (Signed)
Physical Therapy Treatment Patient Details Name: Joseph Bright MRN: [id number] DOB: 1976-01-18 Today's Date: 07/27/2022   History of Present Illness Pt is a 47 year old male admitted 07/25/22 with sudden onset of aphasia and right-sided weakness, stroke code was called, noted to have L ACA occlusion, received TNK and underwent mechanical thrombectomy with TICI 3 result, with reocclusion resulting in stent placement.  PMH of HTN, sleep apnea.    PT Comments    Patient able to stand this session with R leg activation initially, though unable to maintain during attempts at weight shifts or stepping/pivoting to the chair.  He was responding with "yes" or "no" at times with family prompting as well.  VSS throughout.  He will benefit from intensive inpatient rehab prior to d/c home with family support.    Recommendations for follow up therapy are one component of a multi-disciplinary discharge planning process, led by the attending physician.  Recommendations may be updated based on patient status, additional functional criteria and insurance authorization.  Follow Up Recommendations       Assistance Recommended at Discharge Frequent or constant Supervision/Assistance  Patient can return home with the following Two people to help with walking and/or transfers;Two people to help with bathing/dressing/bathroom;Assistance with feeding;Help with stairs or ramp for entrance   Equipment Recommendations  Other (comment) (TBA at next venue)    Recommendations for Other Services       Precautions / Restrictions Precautions Precautions: Fall Precaution Comments: right sided hemiparesis, receptive/expressive deficits Restrictions Weight Bearing Restrictions: No     Mobility  Bed Mobility Overal bed mobility: Needs Assistance Bed Mobility: Rolling, Sidelying to Sit Rolling: Min assist (rolling to R) Sidelying to sit: Mod assist, +2 for physical assistance       General bed mobility comments:  assist for R leg off bed and to lift trunk    Transfers Overall transfer level: Needs assistance Equipment used: 2 person hand held assist, Rolling walker (2 wheels)   Sit to Stand: Mod assist, +2 physical assistance Stand pivot transfers: Mod assist, +2 physical assistance         General transfer comment: initially stood to RW with lifting help, but able to extend R knee and stand erect with cues and some input into R LE during transition; stepping to recliner without RW R knee buckling but moving L foot to pivot with A for balance and safety    Ambulation/Gait             Pre-gait activities: lateral weight shifts while holding walker with L UE noted R knee buckling and cue though knee for keeping extension in stance though needing max A for this     Stairs             Wheelchair Mobility    Modified Rankin (Stroke Patients Only) Modified Rankin (Stroke Patients Only) Pre-Morbid Rankin Score: No symptoms Modified Rankin: Severe disability     Balance Overall balance assessment: Needs assistance   Sitting balance-Leahy Scale: Poor Sitting balance - Comments: LOB to R at times, using foot board with L UE for balance   Standing balance support: During functional activity Standing balance-Leahy Scale: Poor Standing balance comment: Mod A of 2 for standing                            Cognition Arousal/Alertness: Awake/alert Behavior During Therapy: WFL for tasks assessed/performed Overall Cognitive Status: Impaired/Different from baseline Area of Impairment: Attention, Following commands  Current Attention Level: Sustained   Following Commands: Follows one step commands consistently, Follows one step commands with increased time                Exercises      General Comments General comments (skin integrity, edema, etc.): girlfriend in the room initially then wife arrived end of session      Pertinent  Vitals/Pain Pain Assessment Faces Pain Scale: No hurt    Home Living     Available Help at Discharge: Family;Available 24 hours/day;Available PRN/intermittently Type of Home: Apartment                  Prior Function            PT Goals (current goals can now be found in the care plan section) Progress towards PT goals: Progressing toward goals    Frequency    Min 4X/week      PT Plan Current plan remains appropriate    Co-evaluation              AM-PAC PT "6 Clicks" Mobility   Outcome Measure  Help needed turning from your back to your side while in a flat bed without using bedrails?: A Lot Help needed moving from lying on your back to sitting on the side of a flat bed without using bedrails?: A Lot Help needed moving to and from a bed to a chair (including a wheelchair)?: Total Help needed standing up from a chair using your arms (e.g., wheelchair or bedside chair)?: Total Help needed to walk in hospital room?: Total Help needed climbing 3-5 steps with a railing? : Total 6 Click Score: 8    End of Session Equipment Utilized During Treatment: Gait belt Activity Tolerance: Patient limited by fatigue Patient left: in chair;with chair alarm set;with call bell/phone within reach;with family/visitor present Nurse Communication: Mobility status;Need for lift equipment PT Visit Diagnosis: Other symptoms and signs involving the nervous system (R29.898);Other abnormalities of gait and mobility (R26.89);Hemiplegia and hemiparesis Hemiplegia - Right/Left: Right Hemiplegia - dominant/non-dominant: Dominant Hemiplegia - caused by: Cerebral infarction     Time: 1217-1241 PT Time Calculation (min) (ACUTE ONLY): 24 min  Charges:  $Therapeutic Activity: 8-22 mins $Neuromuscular Re-education: 8-22 mins                     Sheran Lawless, PT Acute Rehabilitation Services [phone] 07/27/2022    Joseph Bright 07/27/2022, 5:07 PM

## 2022-07-27 NOTE — Progress Notes (Signed)
NAME:  Joseph Bright, MRN:  [id number], DOB:  [birth year], LOS: 2 ADMISSION DATE:  07/25/2022, CONSULTATION DATE: 07/25/2022 REFERRING MD:  Dr. Wilford Corner, CHIEF COMPLAINT: Acute right-sided weakness  History of Present Illness:  47 year old male with hypertension, noncompliance with medications who was brought into the emergency department with sudden onset of aphasia and right-sided weakness, stroke code was called, noted to have L ACA occlusion, received TNK and underwent mechanical thrombectomy with TICI 3 result, with reocclusion resulting in stent placement.  Patient remained intubated postprocedure, PCCM was consulted for help evaluation medical management  Pertinent  Medical History   Past Medical History:  Diagnosis Date   Hypertension    Stroke (HCC)    Significant Hospital Events: Including procedures, antibiotic start and stop dates in addition to other pertinent events     Interim History / Subjective:  No overnight issues Patient is on room air now  Objective   Blood pressure (!) 144/87, pulse 80, temperature 98.1 F (36.7 C), temperature source Axillary, resp. rate 18, height 5\' 9"  (1.753 m), weight 114.4 kg, SpO2 96 %.        Intake/Output Summary (Last 24 hours) at 07/27/2022 0940 Last data filed at 07/27/2022 0800 Gross per 24 hour  Intake 1494.02 ml  Output 2155 ml  Net -660.98 ml   Filed Weights   07/25/22 0700  Weight: 114.4 kg    Examination: Physical exam: General: Acutely ill-appearing obese African-American male, lying on the bed HEENT: [place]/AT, eyes anicteric.  moist mucus membranes Neuro: Alert, awake following commands.  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 Continue neuro watch every hour Stroke team is following Continue secondary stroke prophylaxis Continue aspirin and Brilinta Continue Crestor Echocardiogram showed hyperdynamic left ventricle with grade 1 diastolic dysfunction MRI brain confirmed left ACA territory stroke and small focal stroke left cerebellum and right basal ganglia  Poorly controlled hypertension, noncompliant with treatment Blood pressure is better controlled now Clevidipine infusion is off SBP goal was changed to  <180/105 for permissive hypertension  Continue as needed hydralazine He will need oral antihypertensive meds prior to discharge  CKD stage IV, 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 Acute metabolic acidosis Patient is at baseline serum creatinine which is between 3.6 - 3.6 Bicarbonate improved to 22 Decrease sodium bicarbonate to 650 mg 3 times daily Monitor intake and output Avoid nephrotoxic agents  Morbid obesity Failed speech and swallow evaluation, continue tube feeds Diet and exercise counseling when appropriate  Obstructive sleep apnea, noncompliant with CPAP Per patient's family he had CPAP long time ago but has not been using it for quite some time now Needs repeat sleep studies as an outpatient  Best Practice (right click and "Reselect all SmartList Selections" daily)   Diet/type: NPO tube feeds DVT prophylaxis: Subcu heparin GI prophylaxis: N/A Lines: N/A Foley: Discontinued today Code Status:  full code Last date of multidisciplinary goals of care discussion [Per primary team]  Labs   CBC: Recent Labs  Lab 07/25/22 0718 07/25/22 0723 07/26/22 0500 07/27/22 0624  WBC 4.2  --  14.8* 8.7  NEUTROABS 2.2  --  12.8*  --   HGB 13.3 13.9 13.1 13.2  HCT 39.4 41.0 37.1* 41.3  MCV 93.1  --  92.1 94.9  PLT 206  --  236 209    Basic Metabolic Panel: Recent Labs  Lab 07/25/22 0718  07/25/22 0723 07/26/22 0500 07/26/22 0545 07/26/22 1511 07/26/22 1825 07/27/22 0624  NA 140 141  --  135  --   --  140  K 4.4 4.4  --  5.0  --   --  4.3  CL 109 111  --  110  --   --  109  CO2 18*  --   --  13*  --   --  22  GLUCOSE 131* 127*  --  124*  --   --  133*  BUN 36* 36*  --  40*  --   --  41*  CREATININE 3.16* 3.60*  --  3.41*  --   --  3.23*  CALCIUM 9.1  --   --  8.6*  --   --  9.0  MG  --   --  1.7  --   --   --  2.2  PHOS  --   --   --   --  4.6 4.3 4.0   GFR: Estimated Creatinine Clearance: 35.3 mL/min (A) (by C-G formula based on SCr of 3.23 mg/dL (H)). Recent Labs  Lab 07/25/22 0718 07/26/22 0500 07/27/22 0624  WBC 4.2 14.8* 8.7    Liver Function Tests: Recent Labs  Lab 07/25/22 0718 07/26/22 0545  AST 28 30  ALT 29 22  ALKPHOS 66 51  BILITOT 0.4 0.5  PROT 6.8 5.6*  ALBUMIN 3.8 3.1*   No results for input(s): "LIPASE", "AMYLASE" in the last 168 hours. No results for input(s): "AMMONIA" in the last 168 hours.  ABG    Component Value Date/Time   HCO3 27.0 (H) 11/19/2006 1413   TCO2 18 (L) 07/25/2022 0723     Coagulation Profile: Recent Labs  Lab 07/25/22 0718  INR 1.0    Cardiac Enzymes: No results for input(s): "CKTOTAL", "CKMB", "CKMBINDEX", "TROPONINI" in the last 168 hours.  HbA1C: Hgb A1c MFr Bld  Date/Time Value Ref Range Status  07/25/2022 11:53 AM 5.6 4.8 - 5.6 % Final    Comment:    (NOTE) Pre diabetes:          5.7%-6.4%  Diabetes:              >6.4%  Glycemic control for   <7.0% adults with diabetes     CBG: Recent Labs  Lab 07/26/22 0231 07/26/22 1941 07/26/22 2345 07/27/22 0328 07/27/22 0802  GLUCAP 145* 107* 134* 120* 115*      Cheri Fowler, MD [facility] See Amion for pager If no response to pager, please call [phone] until 7pm After 7pm, Please call E-link [phone]

## 2022-07-27 NOTE — Evaluation (Signed)
Speech Language Pathology Evaluation Patient Details Name: Joseph Bright MRN: [id number] DOB: [birth year] Today's Date: 07/27/2022 Time: 4098-1191 SLP Time Calculation (min) (ACUTE ONLY): 18 min  Problem List:  Patient Active Problem List   Diagnosis Date Noted   Acute ischemic left ACA stroke (HCC) 07/25/2022   Arterial ischemic stroke, ACA (anterior cerebral artery), left, acute (HCC) 07/25/2022   DYSLIPIDEMIA 04/24/2007   RENAL FAILURE NOS 11/29/2006   HYPERTENSION, ESSENTIAL NOS 11/12/2006   Past Medical History:  Past Medical History:  Diagnosis Date   Hypertension    Stroke [facility])    Past Surgical History:  Past Surgical History:  Procedure Laterality Date   IR PERCUTANEOUS ART THROMBECTOMY/INFUSION INTRACRANIAL INC DIAG ANGIO  07/25/2022   RADIOLOGY WITH ANESTHESIA N/A 07/25/2022   Procedure: RADIOLOGY WITH ANESTHESIA;  Surgeon: Radiologist, Medication, MD;  Location: [facility] OR;  Service: Radiology;  Laterality: N/A;   HPI:  Patient is a 47 y.o. male with PMH: HTN, noncompliance with medications who was brought to ED with sudden onset of aphasia and right sided weakness. MRI brain showeed left ACA territory infarct, cytotoxic edema with no hemorrhagic transformation, multiple additional scattered mostly punctate ischemic foci in  the left greater than right MCA, PCA, and occasionally cerebellar artery territories; No significant intracranial mass effect. He received TNK and underwent mechanical thrombectomy on 07/25/22. He remained intubated following procedure and was extubated same day at 1644.   Assessment / Plan / Recommendation Clinical Impression  Pt presents with components of expressive and receptive aphasia. He followed simple commands with  70% accuracy; answered yes/no questions with perseverative "yes" without recognition of errored responses.  Discrimination among items around the room was approx 70% accurate.  He was able to name basic items to confrontation with 80%  accuracy; responsive naming 25%. Repetition impaired.  Overall, speech is non-fluent, marked by paraphasias and perseverations.  He was responsive to visual and phonemic cues. He will benefit from intensive SLP tx here and in next venue of care. Discussed nature of his aphasia with pt and his family.    SLP Assessment  SLP Recommendation/Assessment: Patient needs continued Speech Lanaguage Pathology Services SLP Visit Diagnosis: Aphasia (R47.01)    Recommendations for follow up therapy are one component of a multi-disciplinary discharge planning process, led by the attending physician.  Recommendations may be updated based on patient status, additional functional criteria and insurance authorization.    Follow Up Recommendations    >three hours therapy/day   Assistance Recommended at Discharge  Frequent or constant Supervision/Assistance  Functional Status Assessment Patient has had a recent decline in their functional status and demonstrates the ability to make significant improvements in function in a reasonable and predictable amount of time.  Frequency and Duration min 2x/week  2 weeks      SLP Evaluation Cognition  Overall Cognitive Status: Impaired/Different from baseline Arousal/Alertness: Awake/alert Attention: Sustained Sustained Attention: Appears intact       Comprehension  Auditory Comprehension Overall Auditory Comprehension: Impaired Yes/No Questions: Impaired Basic Biographical Questions: 51-75% accurate Basic Immediate Environment Questions: 50-74% accurate Commands: Impaired One Step Basic Commands: 50-74% accurate Conversation: Simple Visual Recognition/Discrimination Discrimination: Exceptions to [facility] Common Objects: Able for objects in room (70% accuracy) Reading Comprehension Reading Status: Not tested    Expression Expression Primary Mode of Expression: Verbal Verbal Expression Overall Verbal Expression: Impaired Initiation: Impaired Automatic  Speech: Counting;Day of week Level of Generative/Spontaneous Verbalization: Word Repetition: Impaired Level of Impairment: Word level;Phrase level Naming: Impairment Responsive: 0-25% accurate Confrontation: Impaired Common  Objects: Able for objects in room (70% accuracy) Convergent: 75-100% accurate Verbal Errors: Phonemic paraphasias;Perseveration Written Expression Dominant Hand: Right   Oral / Motor  Motor Speech Overall Motor Speech: Impaired (requires further assessment)            Carolan Shiver 07/27/2022, 2:54 PM Neha Waight L. Samson Frederic, MA CCC/SLP Clinical Specialist - Acute Care SLP Acute Rehabilitation Services Office number [phone]

## 2022-07-27 NOTE — Progress Notes (Signed)
    [facility] has been requested to perform a transesophageal echocardiogram on Joseph Bright following acute L ACA occlusion to evaluate possibility of cardioembolic source.  After careful review of history and examination, the risks and benefits of transesophageal echocardiogram have been explained including risks of esophageal damage, perforation (1:10,000 risk), bleeding, pharyngeal hematoma as well as other potential complications associated with conscious sedation including aspiration, arrhythmia, respiratory failure and death. Alternatives to treatment were discussed, questions were answered.   Following major CVA, patient unable to provide first person consent. Consent obtained from patient's spouse [name] who was at beside along with patient's brother.   Perlie Gold PA-C 07/27/2022 4:11 PM

## 2022-07-27 NOTE — Progress Notes (Addendum)
ASTROKE TEAM PROGRESS NOTE   INTERVAL HISTORY Patient is seen in his room with his wife at the bedside.  He was admitted after sudden onset aphasia and right-sided weakness.  He was brought to the ED and given TNK to treat stroke, and CTA showed left ACA occlusion.  Patient was taken to IR and TICI 3 flow was achieved in left ACA.  Unfortunately, this reoccluded and rescue stenting was performed.  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 Transfer out of ICU today. Neurologically improving   Vitals:   07/27/22 0500 07/27/22 0600 07/27/22 0700 07/27/22 0800  BP: (!) 141/98 136/80 (!) 148/87 (!) 144/87  Pulse: 69 60 68 80  Resp: 17 18 18 18   Temp:      TempSrc:      SpO2: 94% 95% 95% 96%  Weight:      Height:       CBC:  Recent Labs  Lab 07/25/22 0718 07/25/22 0723 07/26/22 0500 07/27/22 0624  WBC 4.2  --  14.8* 8.7  NEUTROABS 2.2  --  12.8*  --   HGB 13.3   < > 13.1 13.2  HCT 39.4   < > 37.1* 41.3  MCV 93.1  --  92.1 94.9  PLT 206  --  236 209   < > = values in this interval not displayed.    Basic Metabolic Panel:  Recent Labs  Lab 07/26/22 0500 07/26/22 0545 07/26/22 1511 07/26/22 1825 07/27/22 0624  NA  --  135  --   --  140  K  --  5.0  --   --  4.3  CL  --  110  --   --  109  CO2  --  13*  --   --  22  GLUCOSE  --  124*  --   --  133*  BUN  --  40*  --   --  41*  CREATININE  --  3.41*  --   --  3.23*  CALCIUM  --  8.6*  --   --  9.0  MG 1.7  --   --   --  2.2  PHOS  --   --    < > 4.3 4.0   < > = values in this interval not displayed.    Lipid Panel:  Recent Labs  Lab 07/27/22 0624  CHOL 165  TRIG 313*  HDL 28*  CHOLHDL 5.9  VLDL 63*  LDLCALC 74    HgbA1c:  Recent Labs  Lab 07/25/22 1153  HGBA1C 5.6    Urine Drug Screen:  Recent Labs  Lab 07/26/22 0600  LABOPIA NONE DETECTED  COCAINSCRNUR NONE DETECTED  LABBENZ NONE DETECTED  AMPHETMU NONE DETECTED  THCU NONE DETECTED  LABBARB NONE  DETECTED     Alcohol Level  Recent Labs  Lab 07/25/22 0718  ETH <10     IMAGING past 24 hours DG Abd Portable 1V  Result Date: 07/26/2022 CLINICAL DATA:  Feeding tube placement EXAM: PORTABLE ABDOMEN - 1 VIEW COMPARISON:  None Available. FINDINGS: Feeding tube tip is in the mid to distal stomach. Nonobstructive bowel gas pattern. IMPRESSION: Feeding tube tip in the stomach. Electronically Signed   By: Charlett Nose M.D.   On: 07/26/2022 15:18   EEG adult  Result Date: 07/26/2022 Charlsie Quest, MD     07/26/2022  9:52 AM Patient Name: Joseph Bright MRN: [id number] Epilepsy Attending: Charlsie Quest Referring Physician/Provider:  Gordy Councilman, MD Date: 07/26/2022 Duration: 23.52 mins Patient history: 47 yo M with brief focal seizure of RUE shaking followed by worsening aphasia for 1-2 min, self-resolved, exam otherwise returned to baseline. EEG to evaluate for seizure Level of alertness: Awake, asleep AEDs during EEG study: LEV Technical aspects: This EEG study was done with scalp electrodes positioned according to the 10-20 International system of electrode placement. Electrical activity was reviewed with band pass filter of 1-70Hz , sensitivity of 7 uV/mm, display speed of 60mm/sec with a 60Hz  notched filter applied as appropriate. EEG data were recorded continuously and digitally stored.  Video monitoring was available and reviewed as appropriate. Description: During awake state, no clear posterior dominant rhythm seen.  Sleep was characterized by sleep spindles (12 to 14 Hz), maximal frontocentral region.  EEG showed continuous generalized and lateralized left hemisphere 3-6 Hz theta- delta slowing.  Hyperventilation and photic stimulation were not performed.   ABNORMALITY -Continuous slow, generalized and lateralized left hemisphere IMPRESSION: This study is suggestive of cortical dysfunction arising from left hemisphere likely secondary to underlying structural abnormality.  Additionally  there is moderate diffuse encephalopathy, nonspecific etiology.  No seizures or epileptiform discharges were seen throughout the recording. Please note lack of epileptiform abnormality during interictal EEG does not exclude the diagnosis of epilepsy. Charlsie Quest   MR ANGIO HEAD WO CONTRAST  Result Date: 07/26/2022 CLINICAL DATA:  47 year old male code stroke presentation yesterday with left ACA A2 segment occlusion. Status post TNK. Status post endovascular revascularization of the left A2 segment, including rescue stenting. EXAM: MRA HEAD WITHOUT CONTRAST TECHNIQUE: Angiographic images of the Circle of Willis were acquired using MRA technique without intravenous contrast. COMPARISON:  MRI today.  CTA head and neck yesterday. FINDINGS: Anterior circulation: Antegrade flow in both ICA siphons. No siphon stenosis. Patent carotid termini. MCA and ACA origins remain normal. Bilateral MCA M1 segments, MCA bifurcations and visible bilateral MCA branches appear stable and within normal limits. Both ACA A1 segments in the anterior communicating arteries are patent, within normal limits. There is an 8 mm segment of pronounced left ACA proximal A2 attenuation which is favored to be vascular stent artifact in this setting. There is maintained ACA antegrade flow signal distal to that segment. No major left ACA branch occlusion is identified. Contralateral right ACA branches are within normal limits. Posterior circulation: Antegrade flow in the posterior circulation. Vertebrobasilar tortuosity without stenosis redemonstrated. Mild to moderate irregularity and stenosis of the left PCA P2 segment is stable on series 455, image 7. Other PCA branches are within normal limits. Anatomic variants: None. Other: MRI today reported separately. IMPRESSION: 1. Left ACA proximal A2 segment is attenuated, but likely due to vascular stent artifact. Preserved distal left ACA flow signal. 2. Otherwise negative Anterior circulation. 3.  Stable Left PCA P2 segment mild to moderate irregularity and stenosis. 4. MRI today reported separately. Electronically Signed   By: Odessa Fleming M.D.   On: 07/26/2022 09:44   MR BRAIN WO CONTRAST  Result Date: 07/26/2022 CLINICAL DATA:  47 year old male code stroke presentation yesterday with left ACA A2 segment occlusion. Status post TNK. Status post endovascular revascularization of the left A2 segment. EXAM: MRI HEAD WITHOUT CONTRAST TECHNIQUE: Multiplanar, multiecho pulse sequences of the brain and surrounding structures were obtained without intravenous contrast. COMPARISON:  Head CT, CTA head and neck yesterday. FINDINGS: Brain: Widespread restricted diffusion in the left ACA territory, confluent along the left cingulate gyrus, superior frontal gyrus. See coronal DWI images. Distal ACA territory  involvement also at the posterior MCA/ACA watershed. And there are superimposed scattered mostly punctate areas of restricted diffusion elsewhere in the left MCA, left greater than right PCA, and occasionally also the right MCA (series 3, image 18) and cerebellar (image 12) vascular territories. Cytotoxic edema in the affected areas. No hemorrhagic transformation is identified. But also superimposed cystic encephalomalacia in the body of the corpus callosum (series 6, image 13). Chronic appearing right greater than left thalamic lacunar infarcts (series 7, image 14). Basal ganglia relatively spared. No chronic cortical encephalomalacia is identified. Brainstem remains normal. Partially empty sella. No midline shift, mass effect, evidence of mass lesion, ventriculomegaly, extra-axial collection. Cervicomedullary junction within normal limits. Vascular: Major intracranial vascular flow voids are preserved. Intracranial MRA is reported separately today. Skull and upper cervical spine: Negative visible cervical spine. Visualized bone marrow signal is within normal limits. Sinuses/Orbits: Negative orbits. Paranasal sinuses  and mastoids are stable and well aerated. Other: Visible internal auditory structures appear normal. IMPRESSION: 1. Confluent Left ACA territory infarct. Cytotoxic edema with no hemorrhagic transformation. 2. Multiple additional scattered mostly punctate ischemic foci in the left greater than right MCA, PCA, and occasionally cerebellar artery territories. No significant intracranial mass effect. 3. 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 4. See intracranial MRA is reported separately today. Electronically Signed   By: Odessa Fleming M.D.   On: 07/26/2022 09:39    PHYSICAL EXAM General: Drowsy, well-nourished, well-developed patient in no acute distress Respiratory: Regular, unlabored respirations on supplemental O2 Neurological:  Awake, eyes open, states he is 48. Follows commands. Dysarthric, repetition intact, difficulty answering questions, expressive aphasia - but was able to name and read on NIHSS sheet. Patient blinks to threat on the left but not of the right hide has slight right-sided facial droop Left full movement, Right hemiplegic moves left upper extremity and left lower extremity with good antigravity strength.  Slight withdrawal of the right upper and right lower extremities to noxious stimuli seen  ASSESSMENT/PLAN Mr. Nolin Grell is a 47 y.o. male with history of hypertension and back pain presenting with sudden onset aphasia and right-sided weakness.  He was brought to the ED and given TNK to treat stroke, and CTA showed left ACA occlusion.  Patient was taken to IR and TICI 3 flow was achieved in left ACA.  Unfortunately, this reoccluded and rescue stenting was performed.  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  Small vessel disease. ASPECTS 10.    CTA head & neck acute occlusion of left ACA A2 segment IR - left A2 occlusion with TICI3 and reocclusion s/p stenting MRI confluent left ACA territory infarct with multiple scattered punctate ischemic foci in right MCA PCA and cerebellar artery territories MRA attenuated left ACA proximal segment, likely due to vascular stent artifact 2D Echo EF 70 to 75% TEE tomorrow at 0830 Consider loop recorder if workup negative prior to discharge LDL 74, TG 313 HgbA1c 5.6 VTE prophylaxis -SQH No antithrombotic prior to admission, now on aspirin 81 mg daily and Brilinta (ticagrelor) 90 mg bid due to stent placement Therapy recommendations: CIR Disposition: pending  Hypertension Home meds: Amlodipine 10 mg daily, hydralazine 50 mg twice daily, labetalol 200 mg twice daily Stable Keep systolic blood pressure less than 180 Long-term BP  goal normotensive  Hyperlipidemia Home meds: Simvastatin 20 mg daily, not compliant LDL 74, goal < 70 Now on Crestor 20 Continue statin at discharge  Seizure like activity Patient had focal seizure overnight consisting of right upper extremity shaking followed by worsening aphasia for about 2 minutes EEG shows no seizures or epileptiform discharges Continue Keppra 500 mg twice daily  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 MD to update note as needed.   Elmer Picker, DNP, FNP-BC [facility] Pager: [phone]  ATTENDING NOTE: I reviewed above note and agree with the assessment and plan. Pt  was seen and examined.   Wife at bedside.  Patient lying in bed, more awake alert, no shivering, language improved, now follows all simple commands, able to repeat simple sentence in dysarthric voice, able to name 5/7.  However still has difficult speech output, consistent with expressive aphasia.  Not blinking to be described on the right, right facial droop, right hemiplegia.  Currently on aspirin and Brilinta as well as Crestor.  Continue Keppra for now.  Plan for TEE tomorrow and if negative consider loop recorder prior to discharge.  PT and OT recommend CIR.  For detailed assessment and plan, please refer to above/below as I have made changes wherever appropriate.   Marvel Plan, MD PhD Stroke Neurology 07/27/2022 1:27 PM  This patient is critically ill due to left ACA infarct, status post TNK and thrombectomy, chronic kidney disease, seizure-like activity and at significant risk of neurological worsening, death form recurrent stroke, hemorrhagic transformation, renal failure, status epilepticus. This patient's care requires constant monitoring of vital signs, hemodynamics, respiratory and cardiac monitoring, review of multiple databases, neurological assessment, discussion with family, other specialists and medical decision making of high complexity. I spent 35 minutes of neurocritical care time in the care of this patient. I had long discussion with wife at bedside, updated pt current condition, treatment plan and potential prognosis, and answered all the questions.  She expressed understanding and appreciation.    To contact Stroke Continuity provider, please refer to [email or web address]. After hours, contact General Neurology

## 2022-07-27 NOTE — TOC Benefit Eligibility Note (Signed)
Patient Product/process development scientist completed.    The patient is currently admitted and upon discharge could be taking Brilinta 90 mg.  The current 30 day co-pay is $75.00.   The patient is insured through [employer] ([employer]) [employer]   This test claim was processed through [place] [facility]- copay amounts may vary at other pharmacies due to [employer], or as the patient moves through the different stages of their insurance plan.  Roland Earl, CPHT Pharmacy Patient Advocate Specialist [facility] Patient Advocate Team Direct Number: [phone]  Fax: [phone]

## 2022-07-27 NOTE — Progress Notes (Signed)
Nutrition Follow-up  DOCUMENTATION CODES:   Obesity unspecified  INTERVENTION:   Recommend continue with TF until diet advanced and pt meeting >60% of his nutrition needs.   Tube feeding via Cortrak tube: Osmolite 1.5 at 60 ml/h (1440 ml per day) Prosource TF20 60 ml daily   Provides 2240 kcal, 110 gm protein, 1094 ml free water daily  100 ml free water every 4 hours Total free water: 1694 ml   Pt is a PEPuP candidate   Pt started on Full Liquids today Ensure Enlive po BID, each supplement provides 350 kcal and 20 grams of protein. Monitor intake and adjust TF as needed.   NUTRITION DIAGNOSIS:   Inadequate oral intake related to dysphagia as evidenced by NPO status. Ongoing.   GOAL:   Patient will meet greater than or equal to 90% of their needs Progressing.   MONITOR:   Diet advancement, TF tolerance  REASON FOR ASSESSMENT:   Consult Enteral/tube feeding initiation and management  ASSESSMENT:   Pt with PMH of HTN and back pain admitted with L ACA stroke s/p TNK and mechanical thrombectomy with rescue stenting after reocclusion.   Pt seen by SLP today. Per SLP pt with expressive and receptive aphasia but able to follow simple commands.   04/30 - extubated  05/01 - failed swallow eval, s/p cortrak placement; xray pending 05/02 - diet advanced to FLD   Medications reviewed and include: colace, miralax, Nabicarb TID  Labs reviewed:  BUN 41 Cr 3.23 A1C 5.6   NUTRITION - FOCUSED PHYSICAL EXAM:  Deferred; RD working remotely   Diet Order:   Diet Order             Diet NPO time specified  Diet effective now           Diet full liquid Room service appropriate? Yes with Assist; Fluid consistency: Thin  Diet effective now                   EDUCATION NEEDS:   No education needs have been identified at this time  Skin:  Skin Assessment: Reviewed RN Assessment  Last BM:  unknown  Height:   Ht Readings from Last 1 Encounters:  07/25/22 5'  9" (1.753 m)    Weight:   Wt Readings from Last 1 Encounters:  07/27/22 115.5 kg   BMI:  Body mass index is 37.6 kg/m.  Estimated Nutritional Needs:   Kcal:  2100-2300  Protein:  105-115 grams  Fluid:  >2.1 L/day  Cammy Copa., RD, LDN, CNSC See AMiON for contact information

## 2022-07-27 NOTE — H&P (View-Only) (Signed)
    [facility] has been requested to perform a transesophageal echocardiogram on Joseph Bright following acute L ACA occlusion to evaluate possibility of cardioembolic source.  After careful review of history and examination, the risks and benefits of transesophageal echocardiogram have been explained including risks of esophageal damage, perforation (1:10,000 risk), bleeding, pharyngeal hematoma as well as other potential complications associated with conscious sedation including aspiration, arrhythmia, respiratory failure and death. Alternatives to treatment were discussed, questions were answered.   Following major CVA, patient unable to provide first person consent. Consent obtained from patient's spouse Joseph Bright who was at beside along with patient's brother.   Joseph Deiss PA-C 07/27/2022 4:11 PM   

## 2022-07-27 NOTE — Progress Notes (Signed)
Pt wanting to hold off on cpap for the night. 

## 2022-07-27 NOTE — Progress Notes (Signed)
  Transition of Care [facility]) Screening Note   Patient Details  Name: Joseph Bright Date of Birth: Jul 22, 1975   Transition of Care [facility]) CM/SW Contact:    Glennon Mac, RN Phone Number: 07/27/2022, 1:51 PM    Transition of Care Department [facility]) has reviewed patient and no TOC needs have been identified at this time. We will continue to monitor patient advancement through interdisciplinary progression rounds. If new patient transition needs arise, please place a TOC consult.  Quintella Baton, RN, BSN  Trauma/Neuro ICU Case Manager [phone]

## 2022-07-27 NOTE — Telephone Encounter (Signed)
Pharmacy Patient Advocate Encounter  Insurance verification completed.    The patient is insured through [employer] ([employer]) [employer]    The patient is currently admitted and ran test claims for the following: Brilinta.  Copays and coinsurance results were relayed to Inpatient clinical team.

## 2022-07-27 NOTE — Progress Notes (Signed)
Speech Language Pathology Treatment: Dysphagia  Patient Details Name: Joseph Bright MRN: [id number] DOB: 04-18-1975 Today's Date: 07/27/2022 Time: 4098-1191 SLP Time Calculation (min) (ACUTE ONLY): 10 min  Assessment / Plan / Recommendation Clinical Impression  Joseph Bright was much more alert and participatory today.  He was sitting in recliner; girlfriend and family were present. Was able to hold cup in LUE and drink thin liquids from cup edge and straw with good attention and no s/s of aspiration during consumption of 6 oz of water. He was assisted with applesauce, again presenting with adequate oral manipulation, no residue post-swallow, improved overall timing since yesterday. Recommend starting a full liquid diet. D/W RD who will make necessary TF adjustments.  Anticipate steady improvements and diet advancement. SLP will follow.   HPI HPI: Patient is a 47 y.o. male with PMH: HTN, noncompliance with medications who was brought to ED with sudden onset of aphasia and right sided weakness. MRI brain showeed left ACA territory infarct, cytotoxic edema with no hemorrhagic transformation, multiple additional scattered mostly punctate ischemic foci in  the left greater than right MCA, PCA, and occasionally cerebellar artery territories; No significant intracranial mass effect. He received TNK and underwent mechanical thrombectomy on 07/25/22. He remained intubated following procedure and was extubated same day at 1644.      SLP Plan  Continue with current plan of care      Recommendations for follow up therapy are one component of a multi-disciplinary discharge planning process, led by the attending physician.  Recommendations may be updated based on patient status, additional functional criteria and insurance authorization.    Recommendations  Diet recommendations: Other(comment) (full liquids) Liquids provided via: Cup;Straw Medication Administration: Whole meds with puree Supervision: Staff to  assist with self feeding Compensations: Slow rate;Small sips/bites Postural Changes and/or Swallow Maneuvers: Seated upright 90 degrees                  Oral care BID;Staff/trained caregiver to provide oral care   Frequent or constant Supervision/Assistance Dysphagia, oropharyngeal phase (R13.12)     Continue with current plan of care     Bentlee Drier, Marchelle Folks Laurice Mitzi Lilja L. Samson Frederic, [place] CCC/SLP Clinical Specialist - Acute Care SLP Acute Rehabilitation Services Office number [phone]  07/27/2022, 2:38 PM

## 2022-07-27 NOTE — Progress Notes (Signed)
Referring Provider(s): Milon Dikes  Supervising Physician: Julieanne Cotton  Patient Status:  [facility] - In-pt  Chief Complaint:  Code stroke  Brief History:  Joseph Bright is a 47 year old male who was admitted after sudden onset aphasia and right-sided weakness.   He was brought to the ED and given TNK to treat stroke.  CTA showed left ACA occlusion.   He underwent Left anterior artery occlusion s/p thrombectomy and stent placement 07/25/22 by Dr. Corliss Skains   MRI showed large left ACA stroke with punctate infarcts scattered in bilateral hemispheres.   Subjective:  Opens eyes and follows simple commands. Wife and another family member at bedside.  Allergies: Patient has no known allergies.  Medications: Prior to Admission medications   Medication Sig Start Date End Date Taking? Authorizing Provider  amLODipine (NORVASC) 10 MG tablet Take 10 mg by mouth daily.   Yes [provider]  hydrALAZINE (APRESOLINE) 50 MG tablet Take 50 mg by mouth 2 (two) times daily.   Yes [provider]  isosorbide mononitrate (IMDUR) 60 MG 24 hr tablet Take 60 mg by mouth daily.   Yes [provider]  labetalol (NORMODYNE) 200 MG tablet Take 200 mg by mouth 2 (two) times daily.   Yes [provider]  simvastatin (ZOCOR) 20 MG tablet Take 20 mg by mouth daily.   Yes [provider]  traMADol (ULTRAM) 50 MG tablet TAKE 1 TO 2 TABLETS(50 TO 100 MG) BY MOUTH AT BEDTIME AS NEEDED Patient not taking: Reported on 07/25/2022 06/25/18   Hilts, Casimiro Needle, MD     Vital Signs: BP 116/81   Pulse 75   Temp 98 F (36.7 C) (Oral)   Resp 18   Ht 5\' 9"  (1.753 m)   Wt 254 lb 10.1 oz (115.5 kg)   SpO2 95%   BMI 37.60 kg/m   Physical Exam Opens eyes to voice Follows simple commands Tongue midline Still with aphasia Unable to move right arm or leg. Groin access ok.  Labs:  CBC: Recent Labs    07/25/22 0718 07/25/22 0723 07/26/22 0500  07/27/22 0624  WBC 4.2  --  14.8* 8.7  HGB 13.3 13.9 13.1 13.2  HCT 39.4 41.0 37.1* 41.3  PLT 206  --  236 209    COAGS: Recent Labs    07/25/22 0718  INR 1.0  APTT 26    BMP: Recent Labs    07/25/22 0718 07/25/22 0723 07/26/22 0545 07/27/22 0624  NA 140 141 135 140  K 4.4 4.4 5.0 4.3  CL 109 111 110 109  CO2 18*  --  13* 22  GLUCOSE 131* 127* 124* 133*  BUN 36* 36* 40* 41*  CALCIUM 9.1  --  8.6* 9.0  CREATININE 3.16* 3.60* 3.41* 3.23*  GFRNONAA 23*  --  21* 23*    LIVER FUNCTION TESTS: Recent Labs    07/25/22 0718 07/26/22 0545  BILITOT 0.4 0.5  AST 28 30  ALT 29 22  ALKPHOS 66 51  PROT 6.8 5.6*  ALBUMIN 3.8 3.1*    Assessment and Plan:  Acute stroke - aphasia and right sided weakness.  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 Corliss Skains.  Care per Neurology and CCM.  Electronically Signed: Gwynneth Macleod, PA-C 07/27/2022, 1:05 PM    I spent a total of 15 Minutes at the the patient's bedside AND on the patient's hospital floor or unit, greater than 50% of which was counseling/coordinating care  for f/u cerebral intervention.

## 2022-07-27 NOTE — Progress Notes (Signed)
  Inpatient Rehabilitation Admissions Coordinator   Met with patient at bedside for rehab assessment. I contacted his wife by phone. We discussed goals and expectations of a possible CIR admit. Wife can provide 24/7 min assist and works from home. I discussed my concern that they live in second floor apartment with 15 step entry. I have requested she consider moving to a first floor apartment. She will follow up with her complex. She prefers CIR for rehab. I await further progress with therapy before proceeding with insurance Auth for possible CIR admit. Please call me with any questions.   Ottie Glazier, RN, MSN Rehab Admissions Coordinator [phone]

## 2022-07-28 ENCOUNTER — Encounter (HOSPITAL_COMMUNITY)
Admission: EM | Disposition: A | Discharge status: Rehabilitation Facility | Payer: Self-pay | Source: Home / Self Care | Attending: Neurology

## 2022-07-28 ENCOUNTER — Inpatient Hospital Stay (HOSPITAL_COMMUNITY): Payer: Managed Care, Other (non HMO) | Admitting: Anesthesiology

## 2022-07-28 ENCOUNTER — Inpatient Hospital Stay (HOSPITAL_COMMUNITY): Payer: Managed Care, Other (non HMO)

## 2022-07-28 DIAGNOSIS — I639 Cerebral infarction, unspecified: Secondary | ICD-10-CM

## 2022-07-28 DIAGNOSIS — I10 Essential (primary) hypertension: Secondary | ICD-10-CM | POA: Diagnosis not present

## 2022-07-28 DIAGNOSIS — I63522 Cerebral infarction due to unspecified occlusion or stenosis of left anterior cerebral artery: Secondary | ICD-10-CM | POA: Diagnosis not present

## 2022-07-28 DIAGNOSIS — Z87891 Personal history of nicotine dependence: Secondary | ICD-10-CM

## 2022-07-28 HISTORY — PX: TEE WITHOUT CARDIOVERSION: SHX5443

## 2022-07-28 HISTORY — PX: LOOP RECORDER INSERTION: EP1214

## 2022-07-28 LAB — BASIC METABOLIC PANEL
Anion gap: 9 (ref 5–15)
BUN: 38 mg/dL — ABNORMAL HIGH (ref 6–20)
CO2: 21 mmol/L — ABNORMAL LOW (ref 22–32)
Calcium: 9.2 mg/dL (ref 8.9–10.3)
Chloride: 109 mmol/L (ref 98–111)
Creatinine, Ser: 2.79 mg/dL — ABNORMAL HIGH (ref 0.61–1.24)
GFR, Estimated: 27 mL/min — ABNORMAL LOW (ref >60)
Glucose, Bld: 105 mg/dL — ABNORMAL HIGH (ref 70–99)
Potassium: 4.9 mmol/L (ref 3.5–5.1)
Sodium: 139 mmol/L (ref 135–145)

## 2022-07-28 LAB — CBC
HCT: 39.8 % (ref 39.0–52.0)
Hemoglobin: 13.4 g/dL (ref 13.0–17.0)
MCH: 30.8 pg (ref 26.0–34.0)
MCHC: 33.7 g/dL (ref 30.0–36.0)
MCV: 91.5 fL (ref 80.0–100.0)
Platelets: 219 10*3/uL (ref 150–400)
RBC: 4.35 MIL/uL (ref 4.22–5.81)
RDW: 13.6 % (ref 11.5–15.5)
WBC: 8 10*3/uL (ref 4.0–10.5)
nRBC: 0 % (ref 0.0–0.2)

## 2022-07-28 LAB — ECHO TEE

## 2022-07-28 LAB — GLUCOSE, CAPILLARY
Glucose-Capillary: 101 mg/dL — ABNORMAL HIGH (ref 70–99)
Glucose-Capillary: 79 mg/dL (ref 70–99)
Glucose-Capillary: 81 mg/dL (ref 70–99)
Glucose-Capillary: 130 mg/dL — ABNORMAL HIGH (ref 70–99)

## 2022-07-28 SURGERY — LOOP RECORDER INSERTION

## 2022-07-28 SURGERY — ECHOCARDIOGRAM, TRANSESOPHAGEAL
Anesthesia: Monitor Anesthesia Care

## 2022-07-28 MED ORDER — ONDANSETRON HCL 4 MG/2ML IJ SOLN: 4.0000 mg | Freq: Once | INTRAMUSCULAR | Status: DC | PRN

## 2022-07-28 MED ORDER — PROPOFOL 10 MG/ML IV BOLUS
INTRAVENOUS | Status: DC | PRN
  Administered 2022-07-28 (×4): 25 mg via INTRAVENOUS

## 2022-07-28 MED ORDER — PROPOFOL 500 MG/50ML IV EMUL
INTRAVENOUS | Status: DC | PRN
  Administered 2022-07-28: 100 ug/kg/min via INTRAVENOUS

## 2022-07-28 MED ORDER — LIDOCAINE-EPINEPHRINE 1 %-1:100000 IJ SOLN
INTRAMUSCULAR | Status: AC
  Filled 2022-07-28: qty 1

## 2022-07-28 MED ORDER — SODIUM CHLORIDE 0.9 % IV SOLN: INTRAVENOUS | Status: DC

## 2022-07-28 SURGICAL SUPPLY — 2 items
PACK LOOP INSERTION (CUSTOM PROCEDURE TRAY) ×1 IMPLANT
SYSTEM MONITOR REVEAL LINQ II (Prosthesis & Implant Heart) IMPLANT

## 2022-07-28 NOTE — Plan of Care (Signed)
Problem: Education: Goal: Knowledge of disease or condition will improve Outcome: Progressing Goal: Knowledge of secondary prevention will improve (MUST DOCUMENT ALL) Outcome: Progressing Goal: Knowledge of patient specific risk factors will improve (Mark N/A or DELETE if not current risk factor) Outcome: Progressing   Problem: Ischemic Stroke/TIA Tissue Perfusion: Goal: Complications of ischemic stroke/TIA will be minimized Outcome: Progressing   Problem: Coping: Goal: Will verbalize positive feelings about self Outcome: Progressing Goal: Will identify appropriate support needs Outcome: Progressing   Problem: Health Behavior/Discharge Planning: Goal: Ability to manage health-related needs will improve Outcome: Progressing Goal: Goals will be collaboratively established with patient/family Outcome: Progressing   Problem: Self-Care: Goal: Ability to participate in self-care as condition permits will improve Outcome: Progressing Goal: Verbalization of feelings and concerns over difficulty with self-care will improve Outcome: Progressing Goal: Ability to communicate needs accurately will improve Outcome: Progressing   Problem: Nutrition: Goal: Risk of aspiration will decrease Outcome: Progressing Goal: Dietary intake will improve Outcome: Progressing   Problem: Education: Goal: Understanding of CV disease, CV risk reduction, and recovery process will improve Outcome: Progressing Goal: Individualized Educational Video(s) Outcome: Progressing   Problem: Activity: Goal: Ability to return to baseline activity level will improve Outcome: Progressing   Problem: Cardiovascular: Goal: Ability to achieve and maintain adequate cardiovascular perfusion will improve Outcome: Progressing Goal: Vascular access site(s) Level 0-1 will be maintained Outcome: Progressing   Problem: Health Behavior/Discharge Planning: Goal: Ability to safely manage health-related needs after  discharge will improve Outcome: Progressing   Problem: Education: Goal: Knowledge of General Education information will improve Description: Including pain rating scale, medication(s)/side effects and non-pharmacologic comfort measures Outcome: Progressing   Problem: Health Behavior/Discharge Planning: Goal: Ability to manage health-related needs will improve Outcome: Progressing   Problem: Clinical Measurements: Goal: Ability to maintain clinical measurements within normal limits will improve Outcome: Progressing Goal: Will remain free from infection Outcome: Progressing Goal: Diagnostic test results will improve Outcome: Progressing Goal: Respiratory complications will improve Outcome: Progressing Goal: Cardiovascular complication will be avoided Outcome: Progressing   Problem: Activity: Goal: Risk for activity intolerance will decrease Outcome: Progressing   Problem: Nutrition: Goal: Adequate nutrition will be maintained Outcome: Progressing   Problem: Coping: Goal: Level of anxiety will decrease Outcome: Progressing   Problem: Elimination: Goal: Will not experience complications related to bowel motility Outcome: Progressing Goal: Will not experience complications related to urinary retention Outcome: Progressing   Problem: Pain Managment: Goal: General experience of comfort will improve Outcome: Progressing   Problem: Safety: Goal: Ability to remain free from injury will improve Outcome: Progressing   Problem: Skin Integrity: Goal: Risk for impaired skin integrity will decrease Outcome: Progressing   Problem: Education: Goal: Knowledge of disease or condition will improve Outcome: Progressing Goal: Knowledge of secondary prevention will improve (MUST DOCUMENT ALL) Outcome: Progressing Goal: Knowledge of patient specific risk factors will improve (Mark N/A or DELETE if not current risk factor) Outcome: Progressing   Problem: Ischemic Stroke/TIA Tissue  Perfusion: Goal: Complications of ischemic stroke/TIA will be minimized Outcome: Progressing   Problem: Coping: Goal: Will verbalize positive feelings about self Outcome: Progressing Goal: Will identify appropriate support needs Outcome: Progressing   Problem: Health Behavior/Discharge Planning: Goal: Ability to manage health-related needs will improve Outcome: Progressing Goal: Goals will be collaboratively established with patient/family Outcome: Progressing   Problem: Self-Care: Goal: Ability to participate in self-care as condition permits will improve Outcome: Progressing Goal: Verbalization of feelings and concerns over difficulty with self-care will improve Outcome: Progressing Goal: Ability to communicate   needs accurately will improve Outcome: Progressing   Problem: Nutrition: Goal: Risk of aspiration will decrease Outcome: Progressing Goal: Dietary intake will improve Outcome: Progressing   Problem: Education: Goal: Knowledge of secondary prevention will improve (MUST DOCUMENT ALL) Outcome: Progressing   

## 2022-07-28 NOTE — Progress Notes (Signed)
Pt is unable to consent for TEE procedure due to acute ACA. AT 630, RN called pt's spouse Filkins,[name] to confirm her understanding of TEE procedure that is scheduled to take place this morning. Pt's spouse stated that Perlie Gold, PA spoke with her yesterday and answered all her questions and consent on behalf of her spouse Martavion Beeck to proceed with transesophageal echocardiogram.

## 2022-07-28 NOTE — Plan of Care (Signed)
  Problem: Education: Goal: Knowledge of secondary prevention will improve (MUST DOCUMENT ALL) Outcome: Not Progressing Goal: Knowledge of patient specific risk factors will improve Joseph Bright N/A or DELETE if not current risk factor) Outcome: Not Progressing   Problem: Ischemic Stroke/TIA Tissue Perfusion: Goal: Complications of ischemic stroke/TIA will be minimized 07/28/2022 0435 by Tanja Port, RN Outcome: Not Progressing 07/28/2022 0433 by Tanja Port, RN Outcome: Progressing   Problem: Health Behavior/Discharge Planning: Goal: Ability to manage health-related needs will improve Outcome: Progressing Goal: Goals will be collaboratively established with patient/family Outcome: Progressing   Problem: Coping: Goal: Will verbalize positive feelings about self Outcome: Progressing Goal: Will identify appropriate support needs Outcome: Progressing   Problem: Nutrition: Goal: Risk of aspiration will decrease Outcome: Progressing Goal: Dietary intake will improve Outcome: Progressing   Problem: Ischemic Stroke/TIA Tissue Perfusion: Goal: Complications of ischemic stroke/TIA will be minimized Outcome: Not Progressing   Problem: Elimination: Goal: Will not experience complications related to bowel motility 07/28/2022 0435 by Tanja Port, RN Outcome: Progressing 07/28/2022 0433 by Tanja Port, RN Outcome: Progressing Goal: Will not experience complications related to urinary retention 07/28/2022 0435 by Tanja Port, RN Outcome: Progressing 07/28/2022 0433 by Tanja Port, RN Outcome: Progressing   Problem: Safety: Goal: Ability to remain free from injury will improve 07/28/2022 0435 by Tanja Port, RN Outcome: Progressing 07/28/2022 0433 by Tanja Port, RN Outcome: Progressing   Problem: Skin Integrity: Goal: Risk for impaired skin integrity will decrease 07/28/2022 0435 by Tanja Port, RN Outcome: Progressing 07/28/2022 0433 by Tanja Port, RN Outcome: Progressing   Problem:  Education: Goal: Knowledge of secondary prevention will improve (MUST DOCUMENT ALL) Outcome: Not Progressing

## 2022-07-28 NOTE — Progress Notes (Signed)
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  PMH of HTN, sleep apnea.    PT Comments    Pt is making great progress, demonstrating high motivation to participate and improve. Pt was able to transition supine <> sit EOB with only modAx1 and transfer to stand using the stedy with min-modA, depending on the height of the surface from which he was standing. Pt was able to perform > x23 standing bouts, whether that was serial sit <> stand reps or standing to work on lateral weight shifting for pre-gait training. In addition, pt performed other seated exercises to facilitate muscular activation in his R UE through weight bearing and AA/PROM in a PNF pattern. Pt remains a great candidate for intensive inpatient rehab, >3 hours/day. Will continue to follow acutely.     Recommendations for follow up therapy are one component of a multi-disciplinary discharge planning process, led by the attending physician.  Recommendations may be updated based on patient status, additional functional criteria and insurance authorization.  Follow Up Recommendations       Assistance Recommended at Discharge Frequent or constant Supervision/Assistance  Patient can return home with the following Two people to help with walking and/or transfers;Two people to help with bathing/dressing/bathroom;Assistance with feeding;Help with stairs or ramp for entrance;Direct supervision/assist for medications management;Direct supervision/assist for financial management;Assist for transportation;Assistance with cooking/housework   Equipment Recommendations  Other  (comment) (TBA at next venue)    Recommendations for Other Services       Precautions / Restrictions Precautions Precautions: Fall Precaution Comments: right sided hemiparesis, receptive/expressive deficits Restrictions Weight Bearing Restrictions: No     Mobility  Bed Mobility Overal bed mobility: Needs Assistance Bed Mobility: Supine to Sit, Sit to Supine     Supine to sit: Mod assist, HOB elevated Sit to supine: Mod assist, HOB elevated   General bed mobility comments: Pt pulling and pushing up on L bed rail with L UE to ascend trunk and pivot hips to R to sit R EOB, needing assistance to bring R leg off bed, manage/protect R UE, and scoot hips to EOB. ModA to lift R leg and protect R arm to return to supine.    Transfers Overall transfer level: Needs assistance Equipment used: Ambulation equipment used Transfers: Sit to/from Stand Sit to Stand: Min assist, Mod assist, From elevated surface           General transfer comment: Stedy used to transfer to stand from elevated EOB 11x with modA to power up and reduce R lean, and then to transfer from stedy flaps >12x with minA. Transfer via Lift Equipment: Stedy  Ambulation/Gait             Pre-gait activities: Lateral weight shifting standing in the stedy ~2 min with tactile cues at R quads to activate in stance phase.     Stairs             Wheelchair Mobility    Modified Rankin (Stroke Patients Only) Modified Rankin (Stroke Patients Only) Pre-Morbid Rankin Score: No symptoms Modified Rankin: Severe disability     Balance Overall balance assessment: Needs assistance Sitting-balance support: Single extremity supported, Feet supported Sitting balance-Leahy Scale: Poor  Sitting balance - Comments: Pt reliant on L UE support to sit EOB statically, min guard for safety Postural control: Right lateral lean Standing balance support: Bilateral upper extremity supported, During functional activity, Reliant  on assistive device for balance Standing balance-Leahy Scale: Poor Standing balance comment: Reliant on UE support and stedy to stand today, >23x total ranging from ~1 sec up to ~2 min at a time                            Cognition Arousal/Alertness: Awake/alert Behavior During Therapy: WFL for tasks assessed/performed Overall Cognitive Status: Impaired/Different from baseline Area of Impairment: Attention, Following commands                   Current Attention Level: Sustained   Following Commands: Follows one step commands consistently, Follows one step commands with increased time       General Comments: Follows simple one-step commands with extra time. Perseverates on "yes" to all questions. Demonstrates good carryover of cues to reduce R lateral lean.        Exercises Other Exercises Other Exercises: sit <> stand from elevated EOB to stedy 11x and from stedy flaps >12x Other Exercises: push-ups from edty bar while seated on stedy flaps with R UE supported by PT, 2 sets x 10-15 reps each Other Exercises: AA/PROM R UE PNF D1 flexion/extension while seated on stedy flaps, x10 reps    General Comments        Pertinent Vitals/Pain Pain Assessment Pain Assessment: Faces Faces Pain Scale: Hurts a little bit Pain Location: unsure, but noted groaning with increased mobility Pain Descriptors / Indicators: Grimacing, Moaning Pain Intervention(s): Limited activity within patient's tolerance, Monitored during session, Repositioned    Home Living                          Prior Function            PT Goals (current goals can now be found in the care plan section) Acute Rehab PT Goals Patient Stated Goal: agreeable to session PT Goal Formulation: With patient Time For Goal Achievement: 08/10/22 Potential to Achieve Goals: Good Progress towards PT goals: Progressing toward goals    Frequency    Min 4X/week      PT Plan Current plan  remains appropriate    Co-evaluation              AM-PAC PT "6 Clicks" Mobility   Outcome Measure  Help needed turning from your back to your side while in a flat bed without using bedrails?: A Lot Help needed moving from lying on your back to sitting on the side of a flat bed without using bedrails?: A Lot Help needed moving to and from a bed to a chair (including a wheelchair)?: Total Help needed standing up from a chair using your arms (e.g., wheelchair or bedside chair)?: A Lot Help needed to walk in hospital room?: Total Help needed climbing 3-5 steps with a railing? : Total 6 Click Score: 9    End of Session Equipment Utilized During Treatment: Gait belt Activity Tolerance: Patient tolerated treatment well Patient left: in bed;with call bell/phone within reach;with bed alarm set Nurse Communication: Mobility status;Need for lift equipment (NT - and condom cath fell off) PT Visit Diagnosis: Other symptoms and signs involving the nervous system (R29.898);Other abnormalities of gait and mobility (R26.89);Hemiplegia and hemiparesis;Unsteadiness on feet (R26.81);Muscle  weakness (generalized) (M62.81);Difficulty in walking, not elsewhere classified (R26.2) Hemiplegia - Right/Left: Right Hemiplegia - dominant/non-dominant: Dominant Hemiplegia - caused by: Cerebral infarction     Time: 1435-1501 PT Time Calculation (min) (ACUTE ONLY): 26 min  Charges:  $Therapeutic Activity: 8-22 mins $Neuromuscular Re-education: 8-22 mins                     Raymond Gurney, PT, DPT Acute Rehabilitation Services  Office: [phone]    [name] 07/28/2022, 3:56 PM

## 2022-07-28 NOTE — Progress Notes (Signed)
Patient arrived to holding rm 21 post TEE. Nasal feeding tube coiled in back of throat; Dr. Teressa Lower attempted to return feeding tube several times w/o success. Dr. Teressa Lower removed feeding tube. 3W nurse notified in hand off. Dr. Jacques Navy aware.

## 2022-07-28 NOTE — Interval H&P Note (Signed)
History and Physical Interval Note:  07/28/2022 8:03 AM  Joseph Bright  has presented today for surgery, with the diagnosis of STROKE.  The various methods of treatment have been discussed with the patient and family. After consideration of risks, benefits and other options for treatment, the patient has consented to  Procedure(s): TRANSESOPHAGEAL ECHOCARDIOGRAM (N/A) as a surgical intervention.  The patient's history has been reviewed, patient examined, no change in status, stable for surgery.  I have reviewed the patient's chart and labs.  Questions were answered to the patient's satisfaction.     Parke Poisson

## 2022-07-28 NOTE — Progress Notes (Signed)
PT Cancellation Note  Patient Details Name: Joseph Bright MRN: [id number] DOB: 03-29-1975   Cancelled Treatment:    Reason Eval/Treat Not Completed: (P) Patient at procedure or test/unavailable. Will plan to follow-up later as time permits.   Raymond Gurney, PT, DPT Acute Rehabilitation Services  Office: [phone]    [name] 07/28/2022, 8:05 AM

## 2022-07-28 NOTE — Interval H&P Note (Signed)
History and Physical Interval Note:  07/28/2022 3:52 PM  Joseph Bright  has presented today for surgery, with the diagnosis of stroke.  The various methods of treatment have been discussed with the patient and family. After consideration of risks, benefits and other options for treatment, the patient has consented to  Procedure(s): LOOP RECORDER INSERTION (N/A) as a surgical intervention.  The patient's history has been reviewed, patient examined, no change in status, stable for surgery.  I have reviewed the patient's chart and labs.  Questions were answered to the patient's satisfaction.     Joseph Bright  Patient presents for ILR implant post CVA. Risks and benefits have been discussed with the patient and family. Risks include bleeding/infection. The patient and family understand the risks and have agreed to the procedure.

## 2022-07-28 NOTE — Anesthesia Preprocedure Evaluation (Addendum)
Anesthesia Evaluation  Patient identified by MRN, date of birth, ID band Patient awake    Reviewed: Allergy & Precautions, H&P , NPO status , Patient's Chart, lab work & pertinent test results  Airway Mallampati: III  TM Distance: <3 FB Neck ROM: Full    Dental no notable dental hx.    Pulmonary former smoker   Pulmonary exam normal breath sounds clear to auscultation       Cardiovascular hypertension, Normal cardiovascular exam Rhythm:Regular Rate:Normal     Neuro/Psych Patient aphasic, nods understanding of anesthetic plan CVA, Residual Symptoms  negative psych ROS   GI/Hepatic negative GI ROS, Neg liver ROS,,,  Endo/Other  negative endocrine ROS    Renal/GU negative Renal ROS  negative genitourinary   Musculoskeletal negative musculoskeletal ROS (+)    Abdominal   Peds negative pediatric ROS (+)  Hematology negative hematology ROS (+)   Anesthesia Other Findings   Reproductive/Obstetrics negative OB ROS                             Anesthesia Physical Anesthesia Plan  ASA: 3  Anesthesia Plan: MAC   Post-op Pain Management: Minimal or no pain anticipated   Induction: Intravenous  PONV Risk Score and Plan: 1 and Propofol infusion and Treatment may vary due to age or medical condition  Airway Management Planned: Nasal Cannula  Additional Equipment:   Intra-op Plan:   Post-operative Plan:   Informed Consent: I have reviewed the patients History and Physical, chart, labs and discussed the procedure including the risks, benefits and alternatives for the proposed anesthesia with the patient or authorized representative who has indicated his/her understanding and acceptance.     Dental advisory given  Plan Discussed with: CRNA and Surgeon  Anesthesia Plan Comments:        Anesthesia Quick Evaluation

## 2022-07-28 NOTE — CV Procedure (Signed)
INDICATIONS: stroke  PROCEDURE:   Informed consent was obtained prior to the procedure. The risks, benefits and alternatives for the procedure were discussed and the patient comprehended these risks.  Risks include, but are not limited to, cough, sore throat, vomiting, nausea, somnolence, esophageal and stomach trauma or perforation, bleeding, low blood pressure, aspiration, pneumonia, infection, trauma to the teeth and death.    After a procedural time-out, the oropharynx was anesthetized with 20% benzocaine spray.   During this procedure the patient was administered propofol per anesthesia.  The patient's heart rate, blood pressure, and oxygen saturation were monitored continuously during the procedure. The period of conscious sedation was 30 minutes, of which I was present face-to-face 100% of this time.  The transesophageal probe was inserted in the esophagus and stomach without difficulty and multiple views were obtained.  The patient was kept under observation until the patient left the procedure room.  The patient left the procedure room in stable condition.   Agitated microbubble saline contrast was administered.  COMPLICATIONS:    There were no immediate complications.  FINDINGS:   FORMAL ECHOCARDIOGRAM REPORT PENDING No source of stroke identified.   RECOMMENDATIONS:    Proceed to loop recorder  Time Spent Directly with the Patient:  45 minutes   Parke Poisson 07/28/2022, 9:11 AM

## 2022-07-28 NOTE — Anesthesia Postprocedure Evaluation (Signed)
Anesthesia Post Note  Patient: Joseph Bright  Procedure(s) Performed: TRANSESOPHAGEAL ECHOCARDIOGRAM     Patient location during evaluation: PACU Anesthesia Type: MAC Level of consciousness: awake and alert Pain management: pain level controlled Vital Signs Assessment: post-procedure vital signs reviewed and stable Respiratory status: spontaneous breathing, nonlabored ventilation, respiratory function stable and patient connected to nasal cannula oxygen Cardiovascular status: stable and blood pressure returned to baseline Postop Assessment: no apparent nausea or vomiting Anesthetic complications: no  No notable events documented.  Last Vitals:  Vitals:   07/28/22 0925 07/28/22 0930  BP: (!) 167/105 (!) 159/99  Pulse:    Resp: (!) 29 (!) 26  Temp:    SpO2: 100% 100%    Last Pain:  Vitals:   07/28/22 0904  TempSrc: Temporal  PainSc: Asleep                 Kerrick Miler S

## 2022-07-28 NOTE — Progress Notes (Signed)
Inpatient Rehabilitation Admissions Coordinator   I await further progress with therapy before pursuing Auth with [employer] for a possible CIR admit.  Ottie Glazier, RN, MSN Rehab Admissions Coordinator [phone] 07/28/2022 2:24 PM

## 2022-07-28 NOTE — Transfer of Care (Signed)
Immediate Anesthesia Transfer of Care Note  Patient: Codey Trickey  Procedure(s) Performed: TRANSESOPHAGEAL ECHOCARDIOGRAM  Patient Location: Cath Lab  Anesthesia Type:MAC  Level of Consciousness: drowsy and patient cooperative  Airway & Oxygen Therapy: Patient Spontanous Breathing and Patient connected to nasal cannula oxygen  Post-op Assessment: Report given to RN, Post -op Vital signs reviewed and stable, and Patient moving all extremities X 4  Post vital signs: Reviewed and stable  Last Vitals:  Vitals Value Taken Time  BP 141/81 07/28/22 0903  Temp    Pulse 101 07/28/22 0904  Resp 32 07/28/22 0904  SpO2 100 % 07/28/22 0904  Vitals shown include unvalidated device data.  Last Pain:  Vitals:   07/28/22 0727  TempSrc: Temporal  PainSc: 0-No pain         Complications: No notable events documented.

## 2022-07-28 NOTE — Discharge Instructions (Signed)
Post procedure wound care instructions Keep incision clean and dry for 3 days. You can remove outer dressing tomorrow. Leave steri-strips (little pieces of tape) on until they fall off or after 7 days Call the office [phone]) for redness, drainage, swelling, or fever.

## 2022-07-28 NOTE — Progress Notes (Signed)
Pt refused cpap

## 2022-07-28 NOTE — Consult Note (Addendum)
ELECTROPHYSIOLOGY CONSULT NOTE  Patient ID: Joseph Bright MRN: [id number], DOB/AGE: 1976-01-05   Admit date: 07/25/2022 Date of Consult: 07/28/2022  Primary Physician: Johny Blamer, MD Primary Cardiologist: none Reason for Consultation: Cryptogenic stroke - requested by Dr. Roda Shutters; recommendations regarding Implantable Loop Recorder  History of Present Illness Joseph Bright was admitted on 07/25/2022 with R sided weakness and aphasia, found with stroke  TNK > IR though re-occluded and required return to IR and restented.    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  he has undergone workup for stroke including echocardiogram and carotid dopplers.  The patient has been monitored on telemetry which has demonstrated sinus rhythm with no arrhythmias.  Inpatient stroke work-up is to be completed with a TEE.   Echocardiogram this admission demonstrated   1. Left ventricular ejection fraction, by estimation, is 70 to 75%. The  left ventricle has hyperdynamic function. The left ventricle has no  regional wall motion abnormalities. There is severe asymmetric left  ventricular hypertrophy of the septal  segment. Left ventricular diastolic parameters are consistent with Grade I  diastolic dysfunction (impaired relaxation). The average left ventricular  global longitudinal strain is -22.0 %. The global longitudinal strain is  normal.   2. Right ventricular systolic function is normal. The right ventricular  size is mildly enlarged.   3. The mitral valve is normal in structure. No evidence of mitral valve  regurgitation. No evidence of mitral stenosis.   4. The aortic valve is tricuspid. Aortic valve regurgitation is not  visualized.   5. The inferior vena cava is normal in size with greater than 50%  respiratory variability, suggesting  right atrial pressure of 3 mmHg.   Comparison(s): No prior Echocardiogram.   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     Unable to assess pre-admit symptoms They are recovering from their stroke with plans to CIR at discharge.    Past Medical History:  Diagnosis Date   Hypertension    Stroke [facility])      Surgical History:  Past Surgical History:  Procedure Laterality Date   IR PERCUTANEOUS ART THROMBECTOMY/INFUSION INTRACRANIAL INC DIAG ANGIO  07/25/2022   RADIOLOGY WITH ANESTHESIA N/A 07/25/2022   Procedure: RADIOLOGY WITH ANESTHESIA;  Surgeon: Radiologist, Medication, MD;  Location: [facility] OR;  Service: Radiology;  Laterality: N/A;     Medications Prior to Admission  Medication Sig Dispense Refill Last Dose   amLODipine (NORVASC) 10 MG tablet Take 10 mg by mouth daily.   UNK   hydrALAZINE (APRESOLINE) 50 MG tablet Take 50 mg by mouth 2 (two) times daily.   UNK   isosorbide mononitrate (IMDUR) 60 MG 24 hr tablet Take 60 mg by mouth daily.   UNK   labetalol (NORMODYNE) 200 MG tablet Take 200 mg by mouth 2 (two) times daily.   UNK   simvastatin (ZOCOR) 20 MG tablet Take 20 mg by mouth daily.   UNK   traMADol (ULTRAM) 50 MG tablet TAKE 1 TO 2 TABLETS(50 TO 100 MG) BY MOUTH AT BEDTIME AS NEEDED (Patient not taking: Reported on 07/25/2022) 20 tablet 0 Not Taking    Inpatient Medications:   aspirin  81 mg Oral Daily   Or   aspirin  81 mg Per Tube Daily  Chlorhexidine Gluconate Cloth  6 each Topical Daily   docusate sodium  100 mg Oral BID   feeding supplement  237 mL Oral BID BM   feeding supplement (PROSource TF20)  60 mL Per Tube Daily   free water  100 mL Per Tube Q4H   heparin injection (subcutaneous)  5,000 Units Subcutaneous Q8H   levETIRAcetam  500 mg Per Tube BID   polyethylene glycol  17 g Oral Daily   rosuvastatin  20 mg Per Tube Daily    sodium bicarbonate  650 mg Oral TID   sodium chloride flush  3 mL Intravenous Once   ticagrelor  90 mg Oral BID   Or   ticagrelor  90 mg Per Tube BID    Allergies: No Known Allergies  Social History   Socioeconomic History   Marital status: Married    Spouse name: Not on file   Number of children: Not on file   Years of education: Not on file   Highest education level: Not on file  Occupational History   Not on file  Tobacco Use   Smoking status: Former    Types: Cigarettes    Start date: 04/17/1995    Quit date: 04/16/2000    Years since quitting: 22.2   Smokeless tobacco: Never  Substance and Sexual Activity   Alcohol use: Yes    Comment: weekends/special events   Drug use: Never   Sexual activity: Not on file  Other Topics Concern   Not on file  Social History Narrative   Not on file   Social Determinants of Health   Financial Resource Strain: Not on file  Food Insecurity: Not on file  Transportation Needs: Not on file  Physical Activity: Not on file  Stress: Not on file  Social Connections: Not on file  Intimate Partner Violence: Not on file     Family: unable to gain family hx 2/2 aphasia.    Review of Systems: d above.  Physical Exam: Vitals:   07/28/22 0930 07/28/22 1142 07/28/22 1208 07/28/22 1525  BP: (!) 159/99 (!) 147/85  134/67  Pulse:  61  (!) 57  Resp: (!) 26 18  19   Temp:  98.4 F (36.9 C)  98.1 F (36.7 C)  TempSrc:  Oral  Oral  SpO2: 100% 100%  99%  Weight:   115.7 kg   Height:        GEN- The patient is well appearing, alert and oriented x 3 today.   Head- normocephalic, atraumatic Eyes-  Sclera clear, conjunctiva pink Ears- hearing intact Oropharynx- clear Neck- supple Lungs- CTA b/l, normal work of breathing Heart- RRR, no murmurs, rubs or gallops  GI- soft, NT, ND Extremities- no clubbing, cyanosis, or edema MS- no significant deformity or atrophy Skin- no rash or lesion Psych- euthymic mood, full  affect   Labs:   Lab Results  Component Value Date   WBC 8.0 07/28/2022   HGB 13.4 07/28/2022   HCT 39.8 07/28/2022   MCV 91.5 07/28/2022   PLT 219 07/28/2022    Recent Labs  Lab 07/26/22 0545 07/27/22 0624 07/28/22 0704  NA 135   < > 139  K 5.0   < > 4.9  CL 110   < > 109  CO2 13*   < > 21*  BUN 40*   < > 38*  CREATININE 3.41*   < > 2.79*  CALCIUM 8.6*   < > 9.2  PROT 5.6*  --   --  BILITOT 0.5  --   --   ALKPHOS 51  --   --   ALT 22  --   --   AST 30  --   --   GLUCOSE 124*   < > 105*   < > = values in this interval not displayed.   Lab Results  Component Value Date   CKTOTAL 334 (H) 11/20/2006   CKMB 2.7 11/20/2006   TROPONINI (H) 11/20/2006    0.17        PERSISTENTLY INCREASED TROPONIN VALUES IN THE RANGE OF 0.06-0.49 ng/mL CAN BE SEEN IN:       -UNSTABLE ANGINA   Lab Results  Component Value Date   CHOL 165 07/27/2022   CHOL 160 07/26/2022   CHOL (H) 11/20/2006    239        ATP III CLASSIFICATION:  <200     mg/dL   Desirable  454-098  mg/dL   Borderline High  >=119    mg/dL   High   Lab Results  Component Value Date   HDL 28 (L) 07/27/2022   HDL 29 (L) 07/26/2022   HDL 29 (L) 11/20/2006   Lab Results  Component Value Date   LDLCALC 74 07/27/2022   LDLCALC UNABLE TO CALCULATE IF TRIGLYCERIDE OVER 400 mg/dL 14/78/2956   LDLCALC (H) 11/20/2006    159        Total Cholesterol/HDL:CHD Risk Coronary Heart Disease Risk Table                     Men   Women  1/2 Average Risk   3.4   3.3   Lab Results  Component Value Date   TRIG 313 (H) 07/27/2022   TRIG 471 (H) 07/26/2022   TRIG 443 (H) 07/26/2022   Lab Results  Component Value Date   CHOLHDL 5.9 07/27/2022   CHOLHDL 5.5 07/26/2022   CHOLHDL 8.2 11/20/2006   Lab Results  Component Value Date   LDLDIRECT 90 07/26/2022    No results found for: "DDIMER"   Radiology/Studies:  IR PERCUTANEOUS ART THROMBECTOMY/INFUSION INTRACRANIAL INC DIAG ANGIO Result Date: 07/27/2022 INDICATION:  Acute onset of left gaze deviation, right-sided hemiplegia and aphasia. Occluded left anterior cerebral artery A2 segment on CT angiogram of the head and neck. EXAM: 1. EMERGENT LARGE VESSEL OCCLUSION THROMBOLYSIS (anterior CIRCULATION) COMPARISON:  CT angiogram of the head and neck. MEDICATIONS: Ancef 2 g IV antibiotic was administered within 1 hour of the procedure. ANESTHESIA/SEDATION: General anesthesia. CONTRAST:  Omnipaque 300 approximately 80 mL. FLUOROSCOPY TIME:  Fluoroscopy Time: 53 minutes 0 seconds (2976 mGy). COMPLICATIONS: None immediate. TECHNIQUE: Following a full explanation of the procedure along with the potential associated complications, an informed witnessed consent was obtained. The risks of intracranial hemorrhage of 10%, worsening neurological deficit, ventilator dependency, death and inability to revascularize were all reviewed in detail with the patient's wife. The patient was then put under general anesthesia by the Department of Anesthesiology at [facility]. The right groin was prepped and draped in the usual sterile fashion. Thereafter using modified Seldinger technique, transfemoral access into the right common femoral artery was obtained without difficulty. Over an 0.035 inch guidewire an 8 French 25 cm Pinnacle sheath was inserted. Through this, and also over an 0.035 inch guidewire a combination of a 125 cm 5.5 French Simmons support catheter inside of an 087 95 cm balloon guide catheter was advanced to the aortic arch region and selectively positioned in the left  common carotid artery. Access into the left common carotid artery was then obtained with the guidewire leading initially into the left common carotid artery and then the left internal carotid artery mid cervical segment. The guidewire, and the support catheter were removed. Control arteriograms were then performed centered at the left common carotid bifurcation and the left internal carotid artery. FINDINGS: The  left common carotid arteriogram demonstrates the left external carotid artery and its major branches to be widely patent. The left internal carotid artery opacifies to the cranial skull base. The petrous, the cavernous and the supraclinoid segments demonstrate wide patency. The left middle cerebral artery opacifies into the capillary and venous phases. The left anterior cerebral artery A2 segment demonstrates complete angiographic occlusion. Prompt cross-filling via the anterior communicating artery of the right anterior cerebral artery A2 segment and distally is noted. PROCEDURE: Through the balloon guide catheter in the distal left internal carotid artery, a 054 155 cm FreeClimb aspiration catheter with the Tenzing was advanced over an 014 inch micro guidewire to the supraclinoid left ICA. The micro guidewire was then selectively positioned in the left anterior cerebral artery followed by the advancement of the FreeClimb 54 with its Tenzing to the left A2 segment. The micro guidewire was then gently advanced through the occluded left ACA A2 segment into the A3 region followed by the advancement of the FreeClimb at the site of the occlusion. At this time the micro guidewire, and the Tenzing were removed. Slight forward advancement of the FreeClimb was evident. Continued aspiration was then applied via an aspiration pump at the hub of the FreeClimb aspiration catheter for approximately 2 minutes. With proximal flow arrest in the left internal carotid artery, the FreeClimb catheter was removed. A control arteriogram performed following reversal of flow arrest demonstrated now revascularized left anterior cerebral A2 segment and distally achieving a TICI 3 revascularization. Also noted at this time was smooth filling defect extending into the frontoparietal branch. This was washed for approximately 5 minutes. During this time a control arteriogram performed demonstrated progressed worsening of the stenosis at the  previously aspiration site. It was felt this represented rebound from intracranial arteriosclerotic plaque. Through the [facility] catheter and the proximal A1 segment, a combination of an 021 Trevo ProVue microcatheter was advanced over an 018 inch micro guidewire with a moderate J configuration. The wire was advanced without difficulty into the distal A3 region followed by the microcatheter. The guidewire was removed. Good aspiration obtained from the hub of the microcatheter, which was connected to continuous heparinized saline infusion. A 3 mm x 24 mm Neuroform Atlas stent was then prepped and purged in the Tuohy Borst. This was then advanced under fluoroscopic guidance to the distal portion of the 021 microcatheter. The entire system was then straightened. The distal marker on the stent was then maintained while the microcatheter was retrieved unsheathing the distal and then the proximal portion of the stent. The delivery micro guidewire was then gently retrieved and removed. A control arteriogram performed through the FreeClimb catheter in the supraclinoid left ICA demonstrated excellent apposition and flow through the stented segment. Control arteriograms were then performed at 10 and 20 minutes post stent placement. This demonstrated intra stent irregularity with slow flow probably representing platelet aggregation. The flow through the stent gradually improved following administration of 7.5 mg of intra arterial Integrilin, and 7.5 milligrams/kg of cangrelor IV bolus. Patient also received aspirin 81 mg, and Brilinta 90 mg via orogastric tube prior to stent placement. A final control  arteriogram performed through the balloon guide catheter in the left common carotid artery demonstrates wide patency of the left internal carotid artery extra cranially and intracranially, and also of the left middle cerebral artery distribution. The left anterior cerebral artery demonstrates opacification with improved patency  within the stent again maintaining a TICI 3 revascularization. An 8 French Angio-Seal closure device was deployed for hemostasis at the right groin puncture site. Distal pulses remained Dopplerable unchanged bilaterally. Patient was left intubated due to his medical condition, and also to protect his airway. He was then transferred to the neuro ICU for post revascularization care. IMPRESSION: Status post revascularization of occluded left anterior cerebral A2 segment with 1 pass with 054 contact aspiration catheter achieving a TICI 3 revascularization. Status post placement of a rescue stent for progressively worsening stenosis secondary to underlying intracranial arteriosclerosis. PLAN: Follow-up in the clinic 4 weeks post discharge. Electronically Signed   By: Julieanne Cotton M.D.   On: 07/27/2022 08:53   DG Abd Portable 1V  Result Date: 07/26/2022 CLINICAL DATA:  Feeding tube placement EXAM: PORTABLE ABDOMEN - 1 VIEW COMPARISON:  None Available. FINDINGS: Feeding tube tip is in the mid to distal stomach. Nonobstructive bowel gas pattern. IMPRESSION: Feeding tube tip in the stomach. Electronically Signed   By: Charlett Nose M.D.   On: 07/26/2022 15:18   EEG adult  Result Date: 07/26/2022 Charlsie Quest, MD     07/26/2022  9:52 AM Patient Name: Greysun Benston MRN: [id number] Epilepsy Attending: Charlsie Quest Referring Physician/Provider: Gordy Councilman, MD Date: 07/26/2022 Duration: 23.52 mins Patient history: 47 yo M with brief focal seizure of RUE shaking followed by worsening aphasia for 1-2 min, self-resolved, exam otherwise returned to baseline. EEG to evaluate for seizure Level of alertness: Awake, asleep AEDs during EEG study: LEV Technical aspects: This EEG study was done with scalp electrodes positioned according to the 10-20 International system of electrode placement. Electrical activity was reviewed with band pass filter of 1-70Hz , sensitivity of 7 uV/mm, display speed of 13mm/sec with a  60Hz  notched filter applied as appropriate. EEG data were recorded continuously and digitally stored.  Video monitoring was available and reviewed as appropriate. Description: During awake state, no clear posterior dominant rhythm seen.  Sleep was characterized by sleep spindles (12 to 14 Hz), maximal frontocentral region.  EEG showed continuous generalized and lateralized left hemisphere 3-6 Hz theta- delta slowing.  Hyperventilation and photic stimulation were not performed.   ABNORMALITY -Continuous slow, generalized and lateralized left hemisphere IMPRESSION: This study is suggestive of cortical dysfunction arising from left hemisphere likely secondary to underlying structural abnormality.  Additionally there is moderate diffuse encephalopathy, nonspecific etiology.  No seizures or epileptiform discharges were seen throughout the recording. Please note lack of epileptiform abnormality during interictal EEG does not exclude the diagnosis of epilepsy. Charlsie Quest   MR ANGIO HEAD WO CONTRAST  Result Date: 07/26/2022 CLINICAL DATA:  47 year old male code stroke presentation yesterday with left ACA A2 segment occlusion. Status post TNK. Status post endovascular revascularization of the left A2 segment, including rescue stenting. EXAM: MRA HEAD WITHOUT CONTRAST TECHNIQUE: Angiographic images of the Circle of Willis were acquired using MRA technique without intravenous contrast. COMPARISON:  MRI today.  CTA head and neck yesterday. FINDINGS: Anterior circulation: Antegrade flow in both ICA siphons. No siphon stenosis. Patent carotid termini. MCA and ACA origins remain normal. Bilateral MCA M1 segments, MCA bifurcations and visible bilateral MCA branches appear stable and within normal limits. Both ACA  A1 segments in the anterior communicating arteries are patent, within normal limits. There is an 8 mm segment of pronounced left ACA proximal A2 attenuation which is favored to be vascular stent artifact in this  setting. There is maintained ACA antegrade flow signal distal to that segment. No major left ACA branch occlusion is identified. Contralateral right ACA branches are within normal limits. Posterior circulation: Antegrade flow in the posterior circulation. Vertebrobasilar tortuosity without stenosis redemonstrated. Mild to moderate irregularity and stenosis of the left PCA P2 segment is stable on series 455, image 7. Other PCA branches are within normal limits. Anatomic variants: None. Other: MRI today reported separately. IMPRESSION: 1. Left ACA proximal A2 segment is attenuated, but likely due to vascular stent artifact. Preserved distal left ACA flow signal. 2. Otherwise negative Anterior circulation. 3. Stable Left PCA P2 segment mild to moderate irregularity and stenosis. 4. MRI today reported separately. Electronically Signed   By: Odessa Fleming M.D.   On: 07/26/2022 09:44   MR BRAIN WO CONTRAST  Result Date: 07/26/2022 CLINICAL DATA:  47 year old male code stroke presentation yesterday with left ACA A2 segment occlusion. Status post TNK. Status post endovascular revascularization of the left A2 segment. EXAM: MRI HEAD WITHOUT CONTRAST TECHNIQUE: Multiplanar, multiecho pulse sequences of the brain and surrounding structures were obtained without intravenous contrast. COMPARISON:  Head CT, CTA head and neck yesterday. FINDINGS: Brain: Widespread restricted diffusion in the left ACA territory, confluent along the left cingulate gyrus, superior frontal gyrus. See coronal DWI images. Distal ACA territory involvement also at the posterior MCA/ACA watershed. And there are superimposed scattered mostly punctate areas of restricted diffusion elsewhere in the left MCA, left greater than right PCA, and occasionally also the right MCA (series 3, image 18) and cerebellar (image 12) vascular territories. Cytotoxic edema in the affected areas. No hemorrhagic transformation is identified. But also superimposed cystic  encephalomalacia in the body of the corpus callosum (series 6, image 13). Chronic appearing right greater than left thalamic lacunar infarcts (series 7, image 14). Basal ganglia relatively spared. No chronic cortical encephalomalacia is identified. Brainstem remains normal. Partially empty sella. No midline shift, mass effect, evidence of mass lesion, ventriculomegaly, extra-axial collection. Cervicomedullary junction within normal limits. Vascular: Major intracranial vascular flow voids are preserved. Intracranial MRA is reported separately today. Skull and upper cervical spine: Negative visible cervical spine. Visualized bone marrow signal is within normal limits. Sinuses/Orbits: Negative orbits. Paranasal sinuses and mastoids are stable and well aerated. Other: Visible internal auditory structures appear normal. IMPRESSION: 1. Confluent Left ACA territory infarct. Cytotoxic edema with no hemorrhagic transformation. 2. Multiple additional scattered mostly punctate ischemic foci in the left greater than right MCA, PCA, and occasionally cerebellar artery territories. No significant intracranial mass effect. 3. 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 4. See intracranial MRA is reported separately today. Electronically Signed   By: Odessa Fleming M.D.   On: 07/26/2022 09:39   CT HEAD WO CONTRAST ( )  Result Date: 07/26/2022 CLINICAL DATA:  Stroke follow-up EXAM: CT HEAD WITHOUT CONTRAST TECHNIQUE: Contiguous axial images were obtained from the base of the skull through the vertex without intravenous contrast. RADIATION DOSE REDUCTION: This exam was performed according to the departmental dose-optimization program which includes automated exposure control, adjustment of the mA and/or kV according to patient size and/or use of iterative reconstruction technique. COMPARISON:  None Available. FINDINGS: Motion degraded study. Brain: There is no mass, hemorrhage or extra-axial  collection. The size and configuration of the  ventricles and extra-axial CSF spaces are normal. 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 There is periventricular hypoattenuation compatible with chronic microvascular disease. Vascular: Anterior cerebral artery stent. Skull: The visualized skull base, calvarium and extracranial soft tissues are normal. Sinuses/Orbits: Mild maxillary sinus mucosal thickening. The orbits are normal. IMPRESSION: 1. Motion degraded study without acute intracranial abnormality. 2. 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 Electronically Signed   By: Deatra Robinson M.D.   On: 07/26/2022 03:48   ECHOCARDIOGRAM COMPLETE  Result Date: 07/25/2022    ECHOCARDIOGRAM REPORT   Patient Name:   [facility] Date of Exam: 07/25/2022 Medical Rec #:  [id number]      Height:       69.0 in Accession #:    [id number]     Weight:       252.2 lb Date of Birth:  05/25/1975      BSA:          2.280 m Patient Age:    46 years       BP:           104/70 mmHg Patient Gender: M              HR:           58 bpm. Exam Location:  Inpatient Procedure: 2D Echo, 3D Echo, Cardiac Doppler, Color Doppler and Strain Analysis Indications:    Stroke  History:        Patient has no prior history of Echocardiogram examinations.                 Risk Factors:Hypertension and Dyslipidemia.  Sonographer:    Sheralyn Boatman RDCS Referring Phys: [id number] ASHISH ARORA  Sonographer Comments: Patient is obese and echo performed with patient supine and on artificial respirator. Image acquisition challenging due to patient body habitus. IMPRESSIONS  1. Left ventricular ejection fraction, by estimation, is 70 to 75%. The left ventricle has hyperdynamic function. The left ventricle has no regional wall motion abnormalities. There is severe asymmetric left ventricular hypertrophy of the septal segment. Left ventricular diastolic parameters are consistent with Grade I diastolic dysfunction (impaired relaxation). The  average left ventricular global longitudinal strain is -22.0 %. The global longitudinal strain is normal.  2. Right ventricular systolic function is normal. The right ventricular size is mildly enlarged.  3. The mitral valve is normal in structure. No evidence of mitral valve regurgitation. No evidence of mitral stenosis.  4. The aortic valve is tricuspid. Aortic valve regurgitation is not visualized.  5. The inferior vena cava is normal in size with greater than 50% respiratory variability, suggesting right atrial pressure of 3 mmHg. Comparison(s): No prior Echocardiogram. 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 FINDINGS  Left Ventricle: Greatest hypertrophy 18 mm Basal Septal on PSAX imaging. No resting systolic anterior motion of the mitral valve. Abnroaml basal septal strain with normal GLS. Left ventricular ejection fraction, by estimation, is 70 to 75%. The left ventricle has hyperdynamic function. The left ventricle has no regional wall motion abnormalities. The average left ventricular global longitudinal strain is -22.0 %. The global longitudinal strain is normal. The left ventricular internal cavity size was  normal in size. There is severe asymmetric left ventricular hypertrophy of the septal segment. Left ventricular diastolic parameters are consistent with Grade I diastolic dysfunction (impaired relaxation). Right Ventricle: The right ventricular size is mildly enlarged. No increase in right ventricular wall thickness.  Right ventricular systolic function is normal. Left Atrium: Left atrial size was normal in size. Right Atrium: Right atrial size was normal in size. Pericardium: There is no evidence of pericardial effusion. Mitral Valve: The mitral valve is normal in structure. No evidence of mitral valve regurgitation. No evidence of mitral valve stenosis. Tricuspid  Valve: The tricuspid valve is normal in structure. Tricuspid valve regurgitation is not demonstrated. No evidence of tricuspid stenosis. Aortic Valve: The aortic valve is tricuspid. Aortic valve regurgitation is not visualized. Pulmonic Valve: The pulmonic valve was normal in structure. Pulmonic valve regurgitation is trivial. No evidence of pulmonic stenosis. Aorta: The aortic root and ascending aorta are structurally normal, with no evidence of dilitation. Venous: The inferior vena cava is normal in size with greater than 50% respiratory variability, suggesting right atrial pressure of 3 mmHg. IAS/Shunts: No atrial level shunt detected by color flow Doppler.  LEFT VENTRICLE PLAX 2D LVIDd:         4.00 cm   Diastology LVIDs:         1.70 cm   LV e' medial:    7.07 cm/s LV PW:         2.10 cm   LV E/e' medial:  10.8 LV IVS:        1.60 cm   LV e' lateral:   5.77 cm/s LVOT diam:     2.30 cm   LV E/e' lateral: 13.3 LV SV:         109 LV SV Index:   48        2D Longitudinal Strain LVOT Area:     4.15 cm  2D Strain GLS Avg:     -22.0 %  RIGHT VENTRICLE             IVC RV S prime:     16.10 cm/s  IVC diam: 1.50 cm TAPSE (M-mode): 2.3 cm LEFT ATRIUM           Index        RIGHT ATRIUM          Index LA diam:      3.50 cm 1.54 cm/m   RA Area:     8.70 cm LA Vol (A2C): 54.7 ml 23.99 ml/m  RA Volume:   16.60 ml 7.28 ml/m LA Vol (A4C): 17.5 ml 7.68 ml/m  AORTIC VALVE             PULMONIC VALVE LVOT Vmax:   134.00 cm/s PR End Diast Vel: 1.58 msec LVOT Vmean:  83.600 cm/s LVOT VTI:    0.263 m  AORTA Ao Root diam: 3.30 cm Ao Asc diam:  3.20 cm MITRAL VALVE MV Area (PHT): 3.12 cm    SHUNTS MV Decel Time: 243 msec    Systemic VTI:  0.26 m MV E velocity: 76.50 cm/s  Systemic Diam: 2.30 cm MV A velocity: 80.40 cm/s MV E/A ratio:  0.95 Riley Lam MD Electronically signed by Riley Lam MD Signature Date/Time: 07/25/2022/3:49:04 PM    Final    CT ANGIO HEAD NECK W WO CM (CODE STROKE)  Result Date:  07/25/2022 CLINICAL DATA:  Code stroke. 47 year old male. Aphasia, right side weakness. EXAM: CT ANGIOGRAPHY HEAD AND NECK WITH AND WITHOUT CONTRAST TECHNIQUE: Multidetector CT imaging of the head and neck was performed using the standard protocol during bolus administration of intravenous contrast. Multiplanar CT image reconstructions and MIPs were obtained to evaluate the vascular anatomy. Carotid stenosis measurements (when applicable) are obtained utilizing NASCET criteria, using  the distal internal carotid diameter as the denominator. RADIATION DOSE REDUCTION: This exam was performed according to the departmental dose-optimization program which includes automated exposure control, adjustment of the mA and/or kV according to patient size and/or use of iterative reconstruction technique. CONTRAST:  75mL OMNIPAQUE IOHEXOL 350 MG/ML SOLN COMPARISON:  Plain head CT 0720 hours today. FINDINGS: CTA NECK Skeleton: No acute osseous abnormality identified. Cervical spine degeneration chiefly C5-C6. Upper chest: Negative. Other neck: Negative. Aortic arch: Mildly tortuous arch. Bovine arch configuration. No arch atherosclerosis. Right carotid system: Negative. Left carotid system: Bovine left CCA origin without plaque or stenosis. No cervical left carotid plaque or stenosis. Vertebral arteries: Negative, and fairly codominant cervical vertebral arteries. CTA HEAD Posterior circulation: Mild V4 segment calcified plaque on the left. Codominant distal vertebral arteries and vertebrobasilar junction are patent without stenosis. Normal left PICA origin. Right AICA appears dominant. Patent basilar artery without stenosis. Basilar tip, SCA and PCA origins appear normal. Posterior communicating arteries are diminutive or absent. Right PCA branches are within normal limits. There is moderate left P2 segment irregularity and stenosis on series 12, image 23 distal left PCA branches remain patent. Anterior circulation: Patent ICA  siphons with no plaque or stenosis. Patent carotid termini, MCA and ACA origins. Bilateral MCA branches are patent and within normal limits. ACA A1 segments and small (fenestrated) anterior communicating arteries are within normal limits. The left ACA A2 segment is occluded on series 7, image 97 about 13 mm from its origin. See also series 12, image 19. No distal reconstitution identified. Right ACA branches remain patent. There is a mild distal right ACA (84) stenosis also on image 19. Venous sinuses: Early contrast timing, not evaluated. Anatomic variants: None significant. Review of the MIP images confirms the above findings Left ACA finding discussed by telephone with Dr. Wilford Corner on 07/25/2022 at 07:32 . IMPRESSION: 1. 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 No distal reconstitution. No other ELVO. This was discussed by telephone with Dr. Wilford Corner on 07/25/2022 at 07:32. 2. Very little atherosclerosis identified in the head or neck. But there is Moderate stenosis of the Left PCA P2 segment. And mild right ACA A4 stenosis. Electronically Signed   By: Odessa Fleming M.D.   On: 07/25/2022 07:42   CT HEAD CODE STROKE WO CONTRAST  Result Date: 07/25/2022 CLINICAL DATA:  Code stroke. 47 year old male. Aphasia, right side weakness. EXAM: CT HEAD WITHOUT CONTRAST TECHNIQUE: Contiguous axial images were obtained from the base of the skull through the vertex without intravenous contrast. RADIATION DOSE REDUCTION: This exam was performed according to the departmental dose-optimization program which includes automated exposure control, adjustment of the mA and/or kV according to patient size and/or use of iterative reconstruction technique. COMPARISON:  Head CT 11/19/2006. FINDINGS: Brain: New since 2008 but chronic appearing, circumscribed lacunar infarct lateral right thalamus series 2, image 18. Increased mild additional patchy white matter opacity such as in the left hemisphere  series 2, image 24. No midline shift, ventriculomegaly, mass effect, evidence of mass lesion, intracranial hemorrhage or evidence of cortically based acute infarction. Chronic partially empty sella. Vascular: Mild Calcified atherosclerosis at the skull base. Skull: No acute osseous abnormality identified. Sinuses/Orbits: Right maxillary molar dental disease with periapical cyst extending into the alveolar recess of that sinus. Mild bilateral maxillary mucosal thickening. Other visualized paranasal sinuses and mastoids are stable and well aerated. Other: Visualized orbits and scalp soft tissues are within normal limits. ASPECTS [facility] Stroke  Program Early CT Score) Total score (0-10 with 10 being normal): 10 IMPRESSION: 1. No acute cortically based infarct or acute intracranial hemorrhage identified. ASPECTS 10. 2. New but chronic appearing right thalamic lacune since 2008. Some increased white matter disease since that time. 3. These results were communicated to Dr. Wilford Corner at 7:27 am on 07/25/2022 by text page via the [facility] messaging system. Electronically Signed   By: Odessa Fleming M.D.   On: 07/25/2022 07:28    12-lead ECG SR All prior EKG's in EPIC reviewed with no documented atrial fibrillation  Telemetry SR  Assessment and Plan:  1. Cryptogenic stroke The patient presents with cryptogenic stroke.  The patient has a TEE planned for this today.  I spoke at length with the patient and his wife on the phone about monitoring for afib with either a 30 day event monitor or an implantable loop recorder.  Risks, benefits, and alteratives to implantable loop recorder were discussed with the patient today.   At this time, the patient who I believe had godd understanding and agreeable given degree of his aphasia, uncertain he was solely able to provide consent and his wife provided phone consent   Please call with questions.   Sheilah Pigeon, PA-C 07/28/2022  I have seen and examined this patient with Francis Dowse.  Agree with above, note added to reflect my findings.  On exam, RRR, no murmurs, lungs clear.  Patient presented to the hospital with cryptogenic stroke. To date, no cause has been found. TEE planned for today. If unrevealing, Alma Muegge plan for LINQ monitor to look for atrial fibrillation. Risks and benefits discussed. Risks include but not limited to bleeding and infection. The patient understands the risks and has agreed to the procedure.  Lois Slagel M. Mariaeduarda Defranco MD 07/28/2022 4:03 PM

## 2022-07-28 NOTE — Progress Notes (Addendum)
ASTROKE TEAM PROGRESS NOTE   INTERVAL HISTORY No family at bedside.  Patient lying in bed, just came back from TEE which showed no PFO or cardioembolic source.  Neuro stable, unchanged, still has expressive aphasia but able to repeat and follow simple commands.  Still has right hemiplegia.  Pending CIR next week.  Will need loop recorder prior to CIR.  Vitals:   07/28/22 0930 07/28/22 1142 07/28/22 1208 07/28/22 1525  BP: (!) 159/99 (!) 147/85  134/67  Pulse:  61  (!) 57  Resp: (!) 26 18  19   Temp:  98.4 F (36.9 C)  98.1 F (36.7 C)  TempSrc:  Oral  Oral  SpO2: 100% 100%  99%  Weight:   115.7 kg   Height:       CBC:  Recent Labs  Lab 07/25/22 0718 07/25/22 0723 07/26/22 0500 07/27/22 0624 07/28/22 0704  WBC 4.2  --  14.8* 8.7 8.0  NEUTROABS 2.2  --  12.8*  --   --   HGB 13.3   < > 13.1 13.2 13.4  HCT 39.4   < > 37.1* 41.3 39.8  MCV 93.1  --  92.1 94.9 91.5  PLT 206  --  236 209 219   < > = values in this interval not displayed.   Basic Metabolic Panel:  Recent Labs  Lab 07/26/22 0500 07/26/22 0545 07/27/22 0624 07/27/22 1936 07/28/22 0704  NA  --    < > 140  --  139  K  --    < > 4.3  --  4.9  CL  --    < > 109  --  109  CO2  --    < > 22  --  21*  GLUCOSE  --    < > 133*  --  105*  BUN  --    < > 41*  --  38*  CREATININE  --    < > 3.23*  --  2.79*  CALCIUM  --    < > 9.0  --  9.2  MG 1.7  --  2.2  --   --   PHOS  --    < > 4.0 3.9  --    < > = values in this interval not displayed.   Lipid Panel:  Recent Labs  Lab 07/27/22 0624  CHOL 165  TRIG 313*  HDL 28*  CHOLHDL 5.9  VLDL 63*  LDLCALC 74   HgbA1c:  Recent Labs  Lab 07/25/22 1153  HGBA1C 5.6   Urine Drug Screen:  Recent Labs  Lab 07/26/22 0600  LABOPIA NONE DETECTED  COCAINSCRNUR NONE DETECTED  LABBENZ NONE DETECTED  AMPHETMU NONE DETECTED  THCU NONE DETECTED  LABBARB NONE DETECTED    Alcohol Level  Recent Labs  Lab 07/25/22 0718  ETH <10    IMAGING past 24 hours ECHO  TEE  Result Date: 07/28/2022    TRANSESOPHOGEAL ECHO REPORT   Patient Name:   Joseph Bright [facility] Date of Exam: 07/28/2022 Medical Rec #:  [id number]      Height:       69.0 in Accession #:    [id number]     Weight:       254.6 lb Date of Birth:  1976/02/15      BSA:          2.289 m Patient Age:    46 years       BP:  141/81 mmHg Patient Gender: M              HR:           109 bpm. Exam Location:  Inpatient Procedure: Cardiac Doppler, Color Doppler, Saline Contrast Bubble Study and            Transesophageal Echo Indications:    Stroke I63.9  History:        Patient has prior history of Echocardiogram examinations, most                 recent 07/25/2022. Stroke.  Sonographer:    Dondra Prader RVT RCS Referring Phys: [id number] Perlie Gold PROCEDURE: TEE procedure time was 30 minutes. The transesophogeal probe was passed without difficulty through the esophogus of the patient. Imaged were obtained with the patient in a left lateral decubitus position. Local oropharyngeal anesthetic was provided with Cetacaine. Sedation performed by different physician. The patient was monitored while under deep sedation. Anesthestetic sedation was provided intravenously by Anesthesiology: 559mg  of Propofol. Image quality was good. The patient's vital signs; including heart rate, blood pressure, and oxygen saturation; remained stable throughout the procedure. The patient developed no complications during the procedure.  IMPRESSIONS  1. Left ventricular ejection fraction, by estimation, is 70 to 75%. The left ventricle has hyperdynamic function. There is severe left ventricular hypertrophy.  2. Right ventricular systolic function is normal. The right ventricular size is normal.  3. No left atrial/left atrial appendage thrombus was detected. The LAA emptying velocity was 73 cm/s.  4. The mitral valve is normal in structure. Trivial mitral valve regurgitation. No evidence of mitral stenosis.  5. The aortic valve is tricuspid. Aortic  valve regurgitation is not visualized. No aortic stenosis is present.  6. Agitated saline contrast bubble study was negative, with no evidence of any interatrial shunt. Conclusion(s)/Recommendation(s): No LA/LAA thrombus identified. Negative bubble study for interatrial shunt. No intracardiac source of embolism detected on this on this transesophageal echocardiogram. FINDINGS  Left Ventricle: Left ventricular ejection fraction, by estimation, is 70 to 75%. The left ventricle has hyperdynamic function. The left ventricular internal cavity size was normal in size. There is severe left ventricular hypertrophy. Right Ventricle: The right ventricular size is normal. No increase in right ventricular wall thickness. Right ventricular systolic function is normal. Left Atrium: Left atrial size was normal in size. No left atrial/left atrial appendage thrombus was detected. The LAA emptying velocity was 73 cm/s. Right Atrium: Right atrial size was normal in size. Pericardium: Trivial pericardial effusion is present. Mitral Valve: The mitral valve is normal in structure. Trivial mitral valve regurgitation. No evidence of mitral valve stenosis. Tricuspid Valve: The tricuspid valve is normal in structure. Tricuspid valve regurgitation is trivial. No evidence of tricuspid stenosis. Aortic Valve: The aortic valve is tricuspid. Aortic valve regurgitation is not visualized. No aortic stenosis is present. Pulmonic Valve: The pulmonic valve was normal in structure. Pulmonic valve regurgitation is trivial. Aorta: The aortic root and ascending aorta are structurally normal, with no evidence of dilitation. IAS/Shunts: No atrial level shunt detected by color flow Doppler. Agitated saline contrast was given intravenously to evaluate for intracardiac shunting. Agitated saline contrast bubble study was negative, with no evidence of any interatrial shunt. Additional Comments: Spectral Doppler performed. LEFT VENTRICLE PLAX 2D LVOT diam:      2.20 cm LV SV:         78 LV SV Index:   34 LVOT Area:     3.80 cm  AORTIC VALVE  LVOT Vmax:   136.00 cm/s LVOT Vmean:  94.600 cm/s LVOT VTI:    0.205 m  AORTA Ao Root diam: 3.50 cm Ao Asc diam:  3.20 cm Ao Arch diam: 3.6 cm  SHUNTS Systemic VTI:  0.20 m Systemic Diam: 2.20 cm Weston Brass MD Electronically signed by Weston Brass MD Signature Date/Time: 07/28/2022/9:58:52 AM    Final    EP STUDY  Result Date: 07/28/2022 See surgical note for result.   PHYSICAL EXAM General: Drowsy, well-nourished, well-developed patient in no acute distress Respiratory: Regular, unlabored respirations Neurological:  Awake, eyes open, answer all orientation questions with "yes".  But follows all simple commands including midline commands and peripheral commands on the left. Dysarthric, repetition intact, difficulty answering questions, expressive aphasia - but was able to name 5/7 and read on NIHSS sheet. Patient blinks to threat bilaterally, has slight right-sided facial droop, left full movement, Right hemiplegic. Sensation not cooperative, left finger-nose intact, and gait not tested.   ASSESSMENT/PLAN Mr. Omi Tavella is a 47 y.o. male with history of hypertension and back pain presenting with sudden onset aphasia and right-sided weakness.  He was brought to the ED and given TNK to treat stroke, and CTA showed left ACA occlusion.  Patient was taken to IR and TICI 3 flow was achieved in left ACA.  Unfortunately, this reoccluded and rescue stenting was performed.  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 Small vessel disease. ASPECTS 10.    CTA head & neck acute occlusion of left ACA A2 segment IR - left A2 occlusion with TICI3 and reocclusion s/p stenting MRI confluent left  ACA territory infarct with multiple scattered punctate ischemic foci in right MCA PCA and cerebellar artery territories MRA attenuated left ACA proximal segment, likely due to vascular stent artifact 2D Echo EF 70 to 75% TEE unremarkable, no PFO, no cardioembolic source Getting loop recorder today LDL 74, TG 313 HgbA1c 5.6 VTE prophylaxis -SQH No antithrombotic prior to admission, now on aspirin 81 mg daily and Brilinta (ticagrelor) 90 mg bid due to stent placement Therapy recommendations: CIR Disposition: pending CIR   Hypertension Home meds: Amlodipine 10 mg daily, hydralazine 50 mg twice daily, labetalol 200 mg twice daily Stable now Keep systolic blood pressure less than 180 Long-term BP goal normotensive  Hyperlipidemia Home meds: Simvastatin 20 mg daily, not compliant LDL 74, goal < 70 Now on Crestor 20 Continue statin at discharge  Seizure like activity Patient had focal seizure overnight consisting of right upper extremity shaking followed by worsening aphasia for about 2 minutes EEG shows no seizures or epileptiform discharges Continue Keppra 500 mg twice daily  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-> 2.79 Baseline CKD Continue to follow-up with outpatient nephrology  Dysphagia Was on tube feeding per core track Also on full liquid diet per speech therapist 5/2 Core track removed during TEE procedure Passed bedside swallow screen 5/3 Put on dys1 and thin liquid Speech following  Other Stroke Risk Factors Former cigarette smoker Obesity, Body mass index is 37.67 kg/m., BMI >/= 30 associated with increased stroke risk, recommend weight loss, diet and exercise as appropriate  Obstructive sleep apnea, not on CPAP at home  Other Active Problems Leukocytosis WBC 14.8-> 8.7-> 8.0  Hospital day # 3   Marvel Plan, MD PhD Stroke Neurology  07/28/2022 3:34 PM   To contact Stroke Continuity provider, please refer to [email or web address]. After hours, contact General Neurology

## 2022-07-28 NOTE — Progress Notes (Signed)
Occupational Therapy Treatment Patient Details Name: Joseph Bright MRN: [id number] DOB: [birth year] Today's Date: 07/28/2022   History of present illness Pt is a 47 year old male admitted 07/25/22 with sudden onset of aphasia and right-sided weakness, stroke code was called, noted to have L ACA occlusion, received TNK and underwent mechanical thrombectomy with TICI 3 result, with reocclusion resulting in stent placement.  PMH of HTN, sleep apnea.   OT comments  Patient had returned from TEE and appeared lethargic but was able to participate with OT treatment. Patient instructed on rolling and bed mobility with max assist to get to EOB. Patient demonstrated right lateral leaning on EOB and was improved following left lateral leaning and resting on LUE. Patient able to perform grooming and reaching tasks seated on EOB using LUE and maintain up right posture with LOB when reaching to right but able to correct with min assist. Patient was max assist to return to supine and RUE elevated. Patient participates well with OT treatment and would benefit from further OT services to address functional transfers, RUE HEP, and self care.    Recommendations for follow up therapy are one component of a multi-disciplinary discharge planning process, led by the attending physician.  Recommendations may be updated based on patient status, additional functional criteria and insurance authorization.    Assistance Recommended at Discharge Frequent or constant Supervision/Assistance  Patient can return home with the following  A lot of help with walking and/or transfers;A lot of help with bathing/dressing/bathroom;Assistance with feeding;Assist for transportation;Help with stairs or ramp for entrance;Direct supervision/assist for medications management;Assistance with cooking/housework;Direct supervision/assist for financial management   Equipment Recommendations  Other (comment) (TBD next venue of care)     Recommendations for Other Services Rehab consult    Precautions / Restrictions Precautions Precautions: Fall Precaution Comments: right sided hemiparesis, receptive/expressive deficits Restrictions Weight Bearing Restrictions: No       Mobility Bed Mobility Overal bed mobility: Needs Assistance Bed Mobility: Rolling, Sidelying to Sit, Sit to Supine Rolling: Mod assist Sidelying to sit: Max assist Supine to sit: Max assist     General bed mobility comments: Assistance with RLE and trunk    Transfers                         Balance Overall balance assessment: Needs assistance Sitting-balance support: Single extremity supported, Feet supported Sitting balance-Leahy Scale: Poor Sitting balance - Comments: right lateral leaning demonstrated initially, able to maintain balance following left lateral leaning, resting on LUE. Able to keep up right posture with LUE support, performed reaching tasks with LOB but able to correct with min assist Postural control: Right lateral lean                                 ADL either performed or assessed with clinical judgement   ADL Overall ADL's : Needs assistance/impaired     Grooming: Wash/dry face;Sitting;Supervision/safety Grooming Details (indicate cue type and reason): able to perform sitting on EOB and maintain sitting balance                               General ADL Comments: focused on sitting balance on EOB    Extremity/Trunk Assessment              Vision       Perception     Praxis  Cognition Arousal/Alertness: Lethargic, Suspect due to medications Behavior During Therapy: WFL for tasks assessed/performed Overall Cognitive Status: Impaired/Different from baseline Area of Impairment: Attention, Following commands                   Current Attention Level: Sustained   Following Commands: Follows one step commands consistently, Follows one step commands with  increased time       General Comments: Patient lethargic, possible due to medication received during TEE. Patient answering yes to most questions        Exercises Exercises: General Upper Extremity General Exercises - Upper Extremity Shoulder Flexion: PROM, Right, 10 reps, Seated Shoulder ABduction: PROM, Right, 10 reps, Seated Elbow Flexion: PROM, Right, 10 reps, Seated Wrist Flexion: PROM, Right, 10 reps, Seated Digit Composite Flexion: PROM, Right, 10 reps, Seated    Shoulder Instructions       General Comments      Pertinent Vitals/ Pain       Pain Assessment Pain Assessment: Faces Faces Pain Scale: No hurt Pain Intervention(s): Monitored during session  Home Living                                          Prior Functioning/Environment              Frequency  Min 2X/week        Progress Toward Goals  OT Goals(current goals can now be found in the care plan section)  Progress towards OT goals: Progressing toward goals  Acute Rehab OT Goals Patient Stated Goal: none stated Time For Goal Achievement: 08/09/22 Potential to Achieve Goals: Good ADL Goals Pt Will Perform Grooming: with min assist;sitting Pt Will Perform Upper Body Bathing: with min assist;sitting Pt Will Transfer to Toilet: with mod assist;squat pivot transfer Pt Will Perform Toileting - Clothing Manipulation and hygiene: with mod assist;sit to/from stand Pt/caregiver will Perform Home Exercise Program: Increased ROM;Right Upper extremity;With written HEP provided;Independently Additional ADL Goal #1: Pt will consistently follow one step verbal commands 50% of the time during 30 minute selfcare tasks. Additional ADL Goal #2: Pt will transfer supine to sit with no more than mod assist in preparation for selfcare tasks.  Plan Discharge plan remains appropriate    Co-evaluation                 AM-PAC OT "6 Clicks" Daily Activity     Outcome Measure   Help from  another person eating meals?: Total Help from another person taking care of personal grooming?: A Lot Help from another person toileting, which includes using toliet, bedpan, or urinal?: Total Help from another person bathing (including washing, rinsing, drying)?: Total Help from another person to put on and taking off regular upper body clothing?: A Lot Help from another person to put on and taking off regular lower body clothing?: Total 6 Click Score: 8    End of Session    OT Visit Diagnosis: Unsteadiness on feet (R26.81);Other abnormalities of gait and mobility (R26.89);Muscle weakness (generalized) (M62.81);Hemiplegia and hemiparesis;Other symptoms and signs involving cognitive function;Cognitive communication deficit (R41.841) Symptoms and signs involving cognitive functions: Cerebral infarction Hemiplegia - Right/Left: Right Hemiplegia - dominant/non-dominant: Dominant Hemiplegia - caused by: Cerebral infarction   Activity Tolerance Patient tolerated treatment well   Patient Left in bed;with call bell/phone within reach;with bed alarm set   Nurse Communication Mobility status  Time: 1610-9604 OT Time Calculation (min): 23 min  Charges: OT General Charges $OT Visit: 1 Visit OT Treatments $Therapeutic Activity: 8-22 mins $Neuromuscular Re-education: 8-22 mins  Alfonse Flavors, OTA Acute Rehabilitation Services  Office [phone]   Dewain Penning 07/28/2022, 2:09 PM

## 2022-07-29 DIAGNOSIS — I63522 Cerebral infarction due to unspecified occlusion or stenosis of left anterior cerebral artery: Secondary | ICD-10-CM | POA: Diagnosis not present

## 2022-07-29 LAB — BASIC METABOLIC PANEL
Anion gap: 11 (ref 5–15)
BUN: 47 mg/dL — ABNORMAL HIGH (ref 6–20)
CO2: 21 mmol/L — ABNORMAL LOW (ref 22–32)
Calcium: 9.2 mg/dL (ref 8.9–10.3)
Chloride: 107 mmol/L (ref 98–111)
Creatinine, Ser: 2.96 mg/dL — ABNORMAL HIGH (ref 0.61–1.24)
GFR, Estimated: 25 mL/min — ABNORMAL LOW (ref >60)
Glucose, Bld: 101 mg/dL — ABNORMAL HIGH (ref 70–99)
Potassium: 5.2 mmol/L — ABNORMAL HIGH (ref 3.5–5.1)
Sodium: 139 mmol/L (ref 135–145)

## 2022-07-29 LAB — GLUCOSE, CAPILLARY
Glucose-Capillary: 104 mg/dL — ABNORMAL HIGH (ref 70–99)
Glucose-Capillary: 120 mg/dL — ABNORMAL HIGH (ref 70–99)
Glucose-Capillary: 123 mg/dL — ABNORMAL HIGH (ref 70–99)
Glucose-Capillary: 124 mg/dL — ABNORMAL HIGH (ref 70–99)
Glucose-Capillary: 89 mg/dL (ref 70–99)
Glucose-Capillary: 97 mg/dL (ref 70–99)
Glucose-Capillary: 99 mg/dL (ref 70–99)

## 2022-07-29 LAB — CBC
HCT: 42.3 % (ref 39.0–52.0)
Hemoglobin: 13.6 g/dL (ref 13.0–17.0)
MCH: 30.3 pg (ref 26.0–34.0)
MCHC: 32.2 g/dL (ref 30.0–36.0)
MCV: 94.2 fL (ref 80.0–100.0)
Platelets: 221 10*3/uL (ref 150–400)
RBC: 4.49 MIL/uL (ref 4.22–5.81)
RDW: 13.3 % (ref 11.5–15.5)
WBC: 7.9 10*3/uL (ref 4.0–10.5)
nRBC: 0 % (ref 0.0–0.2)

## 2022-07-29 LAB — TRIGLYCERIDES: Triglycerides: 265 mg/dL — ABNORMAL HIGH (ref <150)

## 2022-07-29 NOTE — Plan of Care (Signed)
Problem: Education: Goal: Knowledge of disease or condition will improve Outcome: Progressing Goal: Knowledge of secondary prevention will improve (MUST DOCUMENT ALL) Outcome: Progressing Goal: Knowledge of patient specific risk factors will improve Joseph Bright N/A or DELETE if not current risk factor) Outcome: Progressing   Problem: Ischemic Stroke/TIA Tissue Perfusion: Goal: Complications of ischemic stroke/TIA will be minimized Outcome: Progressing   Problem: Coping: Goal: Will verbalize positive feelings about self Outcome: Progressing Goal: Will identify appropriate support needs Outcome: Progressing   Problem: Health Behavior/Discharge Planning: Goal: Ability to manage health-related needs will improve Outcome: Progressing Goal: Goals will be collaboratively established with patient/family Outcome: Progressing   Problem: Self-Care: Goal: Ability to participate in self-care as condition permits will improve Outcome: Progressing Goal: Verbalization of feelings and concerns over difficulty with self-care will improve Outcome: Progressing Goal: Ability to communicate needs accurately will improve Outcome: Progressing   Problem: Nutrition: Goal: Risk of aspiration will decrease Outcome: Progressing Goal: Dietary intake will improve Outcome: Progressing   Problem: Education: Goal: Understanding of CV disease, CV risk reduction, and recovery process will improve Outcome: Progressing Goal: Individualized Educational Video(s) Outcome: Progressing   Problem: Activity: Goal: Ability to return to baseline activity level will improve Outcome: Progressing   Problem: Cardiovascular: Goal: Ability to achieve and maintain adequate cardiovascular perfusion will improve Outcome: Progressing Goal: Vascular access site(s) Level 0-1 will be maintained Outcome: Progressing   Problem: Health Behavior/Discharge Planning: Goal: Ability to safely manage health-related needs after  discharge will improve Outcome: Progressing   Problem: Education: Goal: Knowledge of General Education information will improve Description: Including pain rating scale, medication(s)/side effects and non-pharmacologic comfort measures Outcome: Progressing   Problem: Health Behavior/Discharge Planning: Goal: Ability to manage health-related needs will improve Outcome: Progressing   Problem: Clinical Measurements: Goal: Ability to maintain clinical measurements within normal limits will improve Outcome: Progressing Goal: Will remain free from infection Outcome: Progressing Goal: Diagnostic test results will improve Outcome: Progressing Goal: Respiratory complications will improve Outcome: Progressing Goal: Cardiovascular complication will be avoided Outcome: Progressing   Problem: Activity: Goal: Risk for activity intolerance will decrease Outcome: Progressing   Problem: Nutrition: Goal: Adequate nutrition will be maintained Outcome: Progressing   Problem: Coping: Goal: Level of anxiety will decrease Outcome: Progressing   Problem: Elimination: Goal: Will not experience complications related to bowel motility Outcome: Progressing Goal: Will not experience complications related to urinary retention Outcome: Progressing   Problem: Pain Managment: Goal: General experience of comfort will improve Outcome: Progressing   Problem: Safety: Goal: Ability to remain free from injury will improve Outcome: Progressing   Problem: Skin Integrity: Goal: Risk for impaired skin integrity will decrease Outcome: Progressing   Problem: Education: Goal: Knowledge of disease or condition will improve Outcome: Progressing Goal: Knowledge of secondary prevention will improve (MUST DOCUMENT ALL) Outcome: Progressing Goal: Knowledge of patient specific risk factors will improve Joseph Bright N/A or DELETE if not current risk factor) Outcome: Progressing   Problem: Ischemic Stroke/TIA Tissue  Perfusion: Goal: Complications of ischemic stroke/TIA will be minimized Outcome: Progressing   Problem: Coping: Goal: Will verbalize positive feelings about self Outcome: Progressing Goal: Will identify appropriate support needs Outcome: Progressing   Problem: Health Behavior/Discharge Planning: Goal: Ability to manage health-related needs will improve Outcome: Progressing Goal: Goals will be collaboratively established with patient/family Outcome: Progressing   Problem: Self-Care: Goal: Ability to participate in self-care as condition permits will improve Outcome: Progressing Goal: Verbalization of feelings and concerns over difficulty with self-care will improve Outcome: Progressing Goal: Ability to communicate  needs accurately will improve Outcome: Progressing   Problem: Nutrition: Goal: Risk of aspiration will decrease Outcome: Progressing Goal: Dietary intake will improve Outcome: Progressing   Problem: Education: Goal: Knowledge of secondary prevention will improve (MUST DOCUMENT ALL) Outcome: Progressing

## 2022-07-29 NOTE — Plan of Care (Signed)
  Problem: Education: Goal: Knowledge of disease or condition will improve Outcome: Progressing Goal: Knowledge of secondary prevention will improve (MUST DOCUMENT ALL) Outcome: Progressing Goal: Knowledge of patient specific risk factors will improve Loraine Leriche N/A or DELETE if not current risk factor) Outcome: Progressing   Problem: Ischemic Stroke/TIA Tissue Perfusion: Goal: Complications of ischemic stroke/TIA will be minimized Outcome: Progressing   Problem: Coping: Goal: Will verbalize positive feelings about self Outcome: Progressing Goal: Will identify appropriate support needs Outcome: Progressing   Problem: Health Behavior/Discharge Planning: Goal: Ability to manage health-related needs will improve Outcome: Progressing Goal: Goals will be collaboratively established with patient/family Outcome: Progressing   Problem: Self-Care: Goal: Ability to participate in self-care as condition permits will improve Outcome: Not Progressing Goal: Verbalization of feelings and concerns over difficulty with self-care will improve Outcome: Progressing   Problem: Nutrition: Goal: Risk of aspiration will decrease Outcome: Progressing Goal: Dietary intake will improve Outcome: Progressing   Problem: Education: Goal: Knowledge of disease or condition will improve Outcome: Progressing Goal: Knowledge of secondary prevention will improve (MUST DOCUMENT ALL) Outcome: Progressing Goal: Knowledge of patient specific risk factors will improve Loraine Leriche N/A or DELETE if not current risk factor) Outcome: Progressing   Problem: Ischemic Stroke/TIA Tissue Perfusion: Goal: Complications of ischemic stroke/TIA will be minimized Outcome: Progressing   Problem: Coping: Goal: Will verbalize positive feelings about self Outcome: Progressing Goal: Will identify appropriate support needs Outcome: Progressing   Problem: Health Behavior/Discharge Planning: Goal: Ability to manage health-related needs  will improve Outcome: Progressing Goal: Goals will be collaboratively established with patient/family Outcome: Progressing   Problem: Nutrition: Goal: Risk of aspiration will decrease Outcome: Progressing Goal: Dietary intake will improve Outcome: Progressing   Problem: Education: Goal: Understanding of CV disease, CV risk reduction, and recovery process will improve Outcome: Progressing Goal: Individualized Educational Video(s) Outcome: Progressing   Problem: Activity: Goal: Ability to return to baseline activity level will improve Outcome: Not Progressing   Problem: Cardiovascular: Goal: Ability to achieve and maintain adequate cardiovascular perfusion will improve Outcome: Progressing Goal: Vascular access site(s) Level 0-1 will be maintained Outcome: Progressing   Problem: Health Behavior/Discharge Planning: Goal: Ability to safely manage health-related needs after discharge will improve Outcome: Progressing   Problem: Education: Goal: Knowledge of General Education information will improve Description: Including pain rating scale, medication(s)/side effects and non-pharmacologic comfort measures Outcome: Progressing   Problem: Health Behavior/Discharge Planning: Goal: Ability to manage health-related needs will improve Outcome: Progressing   Problem: Clinical Measurements: Goal: Ability to maintain clinical measurements within normal limits will improve Outcome: Progressing Goal: Will remain free from infection Outcome: Progressing Goal: Diagnostic test results will improve Outcome: Progressing Goal: Respiratory complications will improve Outcome: Progressing Goal: Cardiovascular complication will be avoided Outcome: Progressing   Problem: Activity: Goal: Risk for activity intolerance will decrease Outcome: Progressing   Problem: Elimination: Goal: Will not experience complications related to bowel motility Outcome: Progressing Goal: Will not experience  complications related to urinary retention Outcome: Progressing

## 2022-07-29 NOTE — Progress Notes (Addendum)
ASTROKE TEAM PROGRESS NOTE   INTERVAL HISTORY Patient is seen in his room with no family at the bedside.  TEE yesterday showed no PFO or source of embolism.  He has been hemodynamically stable and his neurological exam is unchanged.  Potassium was 5.2 this morning, will continue to monitor and check EKG. No complaints today.  Vital signs stable Vitals:   07/28/22 2004 07/29/22 0339 07/29/22 0728 07/29/22 0939  BP: (!) 133/98 (!) 148/95 (!) 132/100   Pulse: 67 73 63   Resp: 17 19 19    Temp: 98.9 F (37.2 C) 98.8 F (37.1 C) 99.1 F (37.3 C)   TempSrc: Oral Oral Oral   SpO2: 98% 100% 97%   Weight:    117.7 kg  Height:       CBC:  Recent Labs  Lab 07/25/22 0718 07/25/22 0723 07/26/22 0500 07/27/22 0624 07/28/22 0704 07/29/22 0548  WBC 4.2  --  14.8*   < > 8.0 7.9  NEUTROABS 2.2  --  12.8*  --   --   --   HGB 13.3   < > 13.1   < > 13.4 13.6  HCT 39.4   < > 37.1*   < > 39.8 42.3  MCV 93.1  --  92.1   < > 91.5 94.2  PLT 206  --  236   < > 219 221   < > = values in this interval not displayed.    Basic Metabolic Panel:  Recent Labs  Lab 07/26/22 0500 07/26/22 0545 07/27/22 0624 07/27/22 1936 07/28/22 0704 07/29/22 0548  NA  --    < > 140  --  139 139  K  --    < > 4.3  --  4.9 5.2*  CL  --    < > 109  --  109 107  CO2  --    < > 22  --  21* 21*  GLUCOSE  --    < > 133*  --  105* 101*  BUN  --    < > 41*  --  38* 47*  CREATININE  --    < > 3.23*  --  2.79* 2.96*  CALCIUM  --    < > 9.0  --  9.2 9.2  MG 1.7  --  2.2  --   --   --   PHOS  --    < > 4.0 3.9  --   --    < > = values in this interval not displayed.    Lipid Panel:  Recent Labs  Lab 07/27/22 0624 07/29/22 0548  CHOL 165  --   TRIG 313* 265*  HDL 28*  --   CHOLHDL 5.9  --   VLDL 63*  --   LDLCALC 74  --     HgbA1c:  Recent Labs  Lab 07/25/22 1153  HGBA1C 5.6    Urine Drug Screen:  Recent Labs  Lab 07/26/22 0600  LABOPIA NONE DETECTED  COCAINSCRNUR NONE DETECTED  LABBENZ NONE  DETECTED  AMPHETMU NONE DETECTED  THCU NONE DETECTED  LABBARB NONE DETECTED     Alcohol Level  Recent Labs  Lab 07/25/22 0718  ETH <10     IMAGING past 24 hours EP PPM/ICD IMPLANT  Result Date: 07/28/2022 SURGEON:  Will Jorja Loa, MD   PREPROCEDURE DIAGNOSIS:  Cryptogenic stroke   POSTPROCEDURE DIAGNOSIS: Cryptogenic stroke    PROCEDURES:  1. Implantable loop recorder implantation   INTRODUCTION:  [name]  Oros presents with a history of cryptogenic stroke The costs of loop recorder monitoring have been discussed with the patient. Appropriate time out was performed prior to the procedure.   DESCRIPTION OF PROCEDURE:  Informed written consent was obtained.  The patient required no sedation for the procedure today.  Mapping over the patient's chest was performed to identify the area where electrograms were most prominent for ILR recording.  This area was found to be the left parasternal region over the 4th intercostal space. The patients left chest was therefore prepped and draped in the usual sterile fashion. The skin overlying the left parasternal region was infiltrated with lidocaine for local analgesia.  A 0.5-cm incision was made over the left parasternal region over the 3rd intercostal space.  A subcutaneous ILR pocket was fashioned using a combination of sharp and blunt dissection.  A [employer] Reveal LINQ (serial # [id number]) implantable loop recorder was then placed into the pocket  R waves were very prominent and measured 0.39mV.  Steri- Strips and a sterile dressing were then applied.  There were no early apparent complications.   CONCLUSIONS:  1. Successful implantation of a implantable loop recorder for a history of cryptogenic stroke  2. No early apparent complications. Will Jorja Loa, MD 07/28/2022 5:00 PM    PHYSICAL EXAM General: Drowsy, well-nourished, well-developed patient in no acute distress Respiratory: Regular, unlabored respirations Neurological:  Patient is  alert and responds to name.  He is able to follow simple commands on the left side.  Extraocular movements intact, pupils equal round and reactive, slight right facial droop, able to move left side with good antigravity strength but no movement noted on the right side.   ASSESSMENT/PLAN [name] is a 47 y.o. male with history of hypertension and back pain presenting with sudden onset aphasia and right-sided weakness.  He was brought to the ED and given TNK to treat stroke, and CTA showed left ACA occlusion.  Patient was taken to IR and TICI 3 flow was achieved in left ACA.  Unfortunately, this reoccluded and rescue stenting was performed.  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 Small vessel disease. ASPECTS 10.    CTA head & neck acute occlusion of left ACA A2 segment IR - left A2 occlusion with TICI3 and reocclusion s/p stenting MRI confluent left ACA territory infarct with multiple scattered punctate ischemic foci in right MCA PCA and cerebellar artery territories MRA attenuated left ACA proximal segment, likely due to vascular stent artifact 2D Echo EF 70 to 75% TEE unremarkable, no PFO, no cardioembolic source S/p loop recorder 07/28/22 LDL 74, TG 313 HgbA1c 5.6 VTE prophylaxis -SQH No antithrombotic prior to admission, now on aspirin 81 mg daily and Brilinta (ticagrelor) 90 mg bid due to stent placement Therapy recommendations: CIR Disposition: pending CIR   Hypertension Home meds: Amlodipine 10 mg daily, hydralazine 50 mg twice daily, labetalol 200 mg twice daily Stable now Keep systolic blood pressure less than 180 Long-term BP goal normotensive  Hyperlipidemia Home meds: Simvastatin 20 mg daily, not compliant LDL 74, goal < 70 Now on  Crestor 20 Continue statin at discharge  Seizure like activity Patient had focal seizure overnight consisting of right upper extremity shaking followed by worsening aphasia for about 2 minutes EEG shows no  seizures or epileptiform discharges Continue Keppra 500 mg twice daily  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-> 2.79-> 2.96 Baseline CKD Continue to follow-up with outpatient nephrology  Dysphagia Was on tube feeding per core track Also on full liquid diet per speech therapist 5/2 Core track removed during TEE procedure Passed bedside swallow screen 5/3 Put on dys1 and thin liquid Speech following  Other Stroke Risk Factors Former cigarette smoker Obesity, Body mass index is 38.32 kg/m., BMI >/= 30 associated with increased stroke risk, recommend weight loss, diet and exercise as appropriate  Obstructive sleep apnea, not on CPAP at home  Hyperkalemia Potassium 5.2 today in the setting of elevated creatinine Will check EKG BMP in the morning  Other Active Problems Leukocytosis WBC 14.8-> 8.7-> 8.0-> 7.9 -> stable  Hospital day # 4  Patient seen by NP and then by MD, MD to add a note as needed Cortney E Ernestina Columbia , MSN, AGACNP-BC [facility] See Amion for schedule and pager information 07/29/2022 10:43 AM  I have personally obtained history,examined this patient, reviewed notes, independently viewed imaging studies, participated in medical decision making and plan of care.ROS completed by me personally and pertinent positives fully documented  I have made any additions or clarifications directly to the above note. Agree with note above.  Patient neurological exam remains stable.  TEE yesterday unremarkable.  Loop recorder has been placed.  Awaits transfer to inpatient rehab when bed available.  Medically stable for transfer hopefully in the next few days.  No family at the bedside.  Greater than 50% time during this 35-minute visit was spent on counseling and  coordination of care about his cryptogenic stroke and discussion about evaluation and treatment with patient and care team and answering questions  Delia Heady, MD Medical Director [facility] Pager: [phone] 07/29/2022 1:15 PM  To contact Stroke Continuity provider, please refer to [email or web address]. After hours, contact General Neurology

## 2022-07-29 NOTE — PMR Pre-admission (Signed)
PMR Admission Coordinator Pre-Admission Assessment  Patient: Joseph Bright is an 47 y.o., male MRN: [id number] DOB: 06/16/1975 Height: 5\' 9"  (175.3 cm) Weight: 117.7 kg  Insurance Information HMO: ***    PPO: ***     PCP: ***     IPA: ***     80/20: ***     OTHER: *** PRIMARY: [employer]      Policy#: [id number]      Subscriber: pt CM Name: ***      Phone#: ***     Fax#: *** Pre-Cert#: ***      Employer: *** Benefits:  Phone #: ***     Name: *** Joseph Bright. Date: ***     Deduct: ***      Out of Pocket Max: ***      Life Max: *** CIR: ***      SNF: *** Outpatient: ***     Co-Pay: *** Home Health: ***      Co-Pay: *** DME: ***     Co-Pay: *** Providers: in network  SECONDARY: none  Financial Counselor:       Phone#:   The Data processing manager" for patients in Inpatient Rehabilitation Facilities with attached "Privacy Act Statement-Health Care Records" was provided and verbally reviewed with: N/A  Emergency Contact Information Contact Information     Name Relation Home Work Mobile   Joseph Bright Spouse [phone]  [phone]      Current Medical History  Patient Admitting Diagnosis: CVA  History of Present Illness: 47 year old male with history of HTN, CKD IV, back pain with presumable noncompliance with meds because of no med fill history for last 6 months. Presented via EMS on 07/25/22 when wife found him unable to talk and move right side.  CT head without bleed. TNK given. CTA head and neck sowed left ACA occlusion. Went to IR with stenting, etiology felt likely large vessel disease vs cardioembolic source. MRI confluent left ACA territory infarct with multiple scattered punctuate ischemic foci in right MCA PCA and cerebellar artery territories, TEE unremarkable, no PFO no cardioembolic source, Loop recorder placed 07/28/22.NO antithrombotics pta, now on Asa and Brilinta bid due to stent placement.  Home meds of amlodipine , hydralazine and labetalol, Stable now. LDL  74, Now on Crestor. Seizure like activity with RUE shaking followed by worsening aphasia of r2 minutes, EEG showed no seizure or epileptiform discharged, Placed on Keppra. Baseline CKD IV, monitor labs. Was on TF per cortrak., Removed during TEE. SLP eval and now on dysphagia 1 and thin liquids.  Complete NIHSS TOTAL: 14  Patient's medical record from [facility] has been reviewed by the rehabilitation admission coordinator and physician.  Past Medical History  Past Medical History:  Diagnosis Date   Hypertension    Stroke [facility])    Has the patient had major surgery during 100 days prior to admission? Yes  Family History   family history is not on file.  Current Medications  Current Facility-Administered Medications:    acetaminophen (TYLENOL) tablet 650 mg, 650 mg, Oral, Q4H PRN, 650 mg at 07/30/22 1620 **OR** acetaminophen (TYLENOL) 160 MG/5ML solution 650 mg, 650 mg, Per Tube, Q4H PRN **OR** acetaminophen (TYLENOL) suppository 650 mg, 650 mg, Rectal, Q4H PRN, Milon Dikes, MD   aspirin chewable tablet 81 mg, 81 mg, Oral, Daily, 81 mg at 07/30/22 0908 **OR** aspirin chewable tablet 81 mg, 81 mg, Per Tube, Daily, Deveshwar, Sanjeev, MD   Chlorhexidine Gluconate Cloth 2 % PADS 6 each, 6 each, Topical,  Daily, Cheri Fowler, MD, 6 each at 07/30/22 4098   docusate sodium (COLACE) capsule 100 mg, 100 mg, Oral, BID, Francena Hanly, RPH, 100 mg at 07/30/22 1191   feeding supplement (ENSURE ENLIVE / ENSURE PLUS) liquid 237 mL, 237 mL, Oral, BID BM, Marvel Plan, MD, 237 mL at 07/30/22 1621   feeding supplement (OSMOLITE 1.5 CAL) liquid 1,000 mL, 1,000 mL, Per Tube, Continuous, Marvel Plan, MD, Stopped at 07/28/22 0012   feeding supplement (PROSource TF20) liquid 60 mL, 60 mL, Per Tube, Daily, Marvel Plan, MD, 60 mL at 07/27/22 1019   heparin injection 5,000 Units, 5,000 Units, Subcutaneous, Q8H, Marvel Plan, MD, 5,000 Units at 07/30/22 1620   hydrALAZINE (APRESOLINE) injection 10-20  mg, 10-20 mg, Intravenous, Q2H PRN, Migdalia Dk, MD, 20 mg at 07/26/22 0443   levETIRAcetam (KEPPRA) tablet 500 mg, 500 mg, Per Tube, BID, Marvel Plan, MD, 500 mg at 07/30/22 0908   Oral care mouth rinse, 15 mL, Mouth Rinse, PRN, Bhagat, Srishti L, MD, 15 mL at 07/26/22 2128   polyethylene glycol (MIRALAX / GLYCOLAX) packet 17 g, 17 g, Oral, Daily, Francena Hanly, RPH, 17 g at 07/30/22 0908   rosuvastatin (CRESTOR) tablet 20 mg, 20 mg, Per Tube, Daily, Marvel Plan, MD, 20 mg at 07/30/22 0908   senna-docusate (Senokot-S) tablet 1 tablet, 1 tablet, Oral, QHS PRN, Milon Dikes, MD   sodium bicarbonate tablet 650 mg, 650 mg, Oral, TID, Cheri Fowler, MD, 650 mg at 07/30/22 1620   sodium chloride flush (NS) 0.9 % injection 3 mL, 3 mL, Intravenous, Once, Benjiman Core, MD   ticagrelor [facility]) tablet 90 mg, 90 mg, Oral, BID, 90 mg at 07/30/22 0908 **OR** ticagrelor (BRILINTA) tablet 90 mg, 90 mg, Per Tube, BID, Deveshwar, Simonne Maffucci, MD, 90 mg at 07/27/22 2120  Patients Current Diet:  Diet Order             DIET - DYS 1 Room service appropriate? Yes; Fluid consistency: Thin  Diet effective now                  Precautions / Restrictions Precautions Precautions: Fall Precaution Comments: right sided hemiparesis, receptive/expressive deficits Restrictions Weight Bearing Restrictions: No   Has the patient had 2 or more falls or a fall with injury in the past year? No  Prior Activity Level Community (5-7x/wk): Independent, working, driving, Designer, industrial/product for [employer], power w/c equipment  Prior Functional Level Self Care: Did the patient need help bathing, dressing, using the toilet or eating? Independent  Indoor Mobility: Did the patient need assistance with walking from room to room (with or without device)? Independent  Stairs: Did the patient need assistance with internal or external stairs (with or without device)? Independent  Functional Cognition: Did the patient  need help planning regular tasks such as shopping or remembering to take medications? Independent  Patient Information Are you of Hispanic, Latino/a,or Spanish origin?: X. Patient unable to respond (wife states no) What is your race?: X. Patient unable to respond (wife states black) Do you need or want an interpreter to communicate with a doctor or health care staff?: 9. Unable to respond (wife states no)  Patient's Response To:  Health Literacy and Transportation Is the patient able to respond to health literacy and transportation needs?: No Health Literacy - How often do you need to have someone help you when you read instructions, pamphlets, or other written material from your doctor or pharmacy?: Patient unable to respond In the past 12  months, has lack of transportation kept you from medical appointments or from getting medications?: No (no per wife) In the past 12 months, has lack of transportation kept you from meetings, work, or from getting things needed for daily living?: No (no per wife)  Journalist, newspaper / Equipment Home Assistive Devices/Equipment: None Home Equipment: None  Prior Device Use: Indicate devices/aids used by the patient prior to current illness, exacerbation or injury? None of the above  Current Functional Level Cognition  Arousal/Alertness: Awake/alert Overall Cognitive Status: Difficult to assess Difficult to assess due to: Impaired communication Current Attention Level: Selective Orientation Level: Oriented to person, Other (comment) (UTA aphasic) Following Commands: Follows one step commands consistently General Comments: Able to answer "Tarheels" when asked who his college team was. Also did answer "no" appropriately to 2 questions. Attention: Sustained Sustained Attention: Appears intact    Extremity Assessment (includes Sensation/Coordination)  Upper Extremity Assessment: Defer to OT evaluation RUE Deficits / Details: Brunnstrum stage I in  the arm with no active movement noted in the arm or digits. RUE Sensation:  (unable to determine secondary to global communication deficits) RUE Coordination: decreased fine motor, decreased gross motor  Lower Extremity Assessment: RLE deficits/detail RLE Deficits / Details: PROM difficult with increased tone throughout, no active movement noted RLE Coordination: decreased gross motor, decreased fine motor    ADLs  Overall ADL's : Needs assistance/impaired Grooming: Wash/dry face, Sitting, Supervision/safety Grooming Details (indicate cue type and reason): able to perform sitting on EOB and maintain sitting balance Toilet Transfer: Maximal assistance, +2 for physical assistance, Stand-pivot Toilet Transfer Details (indicate cue type and reason): simulated to the recliner Toileting- Clothing Manipulation and Hygiene: Sit to/from stand, Maximal assistance Toileting - Clothing Manipulation Details (indicate cue type and reason): simulated standing Functional mobility during ADLs: Maximal assistance, +2 for safety/equipment (sit to stand and for squat pivot transfer) General ADL Comments: focused on sitting balance on EOB    Mobility  Overal bed mobility: Needs Assistance Bed Mobility: Supine to Sit Rolling: Mod assist Sidelying to sit: Max assist Supine to sit: Mod assist, HOB elevated Sit to supine: Mod assist, HOB elevated General bed mobility comments: Assist to bring RLE off of bed and elevate trunk into sitting.    Transfers  Overall transfer level: Needs assistance Equipment used: Ambulation equipment used [employer], back of chair) Transfers: Sit to/from Stand Sit to Stand: Min assist, Mod assist, From elevated surface Bed to/from chair/wheelchair/BSC transfer type:: Via Lift equipment Stand pivot transfers: Mod assist, +2 physical assistance Squat pivot transfers: Max assist, +2 physical assistance Transfer via Lift Equipment: Stedy General transfer comment: Stood x 3 with mod  assist from elevated bed using chair back for LUE support. Assist to power up and to block/stabilize rt knee and push rt hip into extension. Used Stedy with pt coming to stand with min assist to power up. Stood x 6 with [employer]. Used Stedy for bed to chair.    Ambulation / Gait / Stairs / Wheelchair Mobility  Ambulation/Gait General Gait Details: unable Pre-gait activities: Lateral weight shift standing with chair back support requiring mod assist to block/support RLE. No quad activation on rt. Lateral wt shifting in [place] with manual facilitation to try and activate musculature.    Posture / Balance Dynamic Sitting Balance Sitting balance - Comments: Pt reliant on L UE support to sit EOB statically, min guard for safety Balance Overall balance assessment: Needs assistance Sitting-balance support: Single extremity supported, Feet supported Sitting balance-Leahy Scale: Poor Sitting balance -  Comments: Pt reliant on L UE support to sit EOB statically, min guard for safety Postural control: Right lateral lean Standing balance support: Bilateral upper extremity supported, During functional activity, Reliant on assistive device for balance Standing balance-Leahy Scale: Poor Standing balance comment: Stedy and min assist for static standing. Mod assist standing using chair back    Special needs/care consideration Presumed noncompliance with HTN meds due to no med filled for 6 months   Previous Home Environment  Living Arrangements: Spouse/significant other  Lives With: Spouse Available Help at Discharge: Available 24 hours/day (wife works from home) Type of Home: Apartment Home Layout: One level Home Access: Stairs to enter Entrance Stairs-Rails: Left, Right Entrance Stairs-Number of Steps: 15; 2nd floor apartment Bathroom Shower/Tub: Engineer, manufacturing systems: Standard Bathroom Accessibility: Yes How Accessible: Accessible via walker, Accessible via wheelchair Home Care Services:  No  Discharge Living Setting Plans for Discharge Living Setting: Patient's home, Lives with (comment), Apartment (spouse) Type of Home at Discharge: Apartment Discharge Home Layout: One level Discharge Home Access: Stairs to enter Entrance Stairs-Rails: Right, Left, Can reach both Entrance Stairs-Number of Steps: 15 Discharge Bathroom Shower/Tub: Tub/shower unit Discharge Bathroom Toilet: Standard Discharge Bathroom Accessibility: Yes How Accessible: Accessible via walker Does the patient have any problems obtaining your medications?: No  Social/Family/Support Systems Patient Roles: Spouse, Parent, Other (Comment) Pharmacist, community) Contact Information: wife, [name] Anticipated Caregiver: wife Anticipated Caregiver's Contact Information: see contacts Ability/Limitations of Caregiver: works from home Caregiver Availability: 24/7 Discharge Plan Discussed with Primary Caregiver: Yes Is Caregiver In Agreement with Plan?: Yes Does Caregiver/Family have Issues with Lodging/Transportation while Pt is in Rehab?: No  Goals Patient/Family Goal for Rehab: min assist PT, OT and SLP Expected length of stay: ELOS 21 to 28 days Additional Information: I have requested wife to seek first floor apartment with their complex. They have leased there for 8 years Pt/Family Agrees to Admission and willing to participate: Yes Program Orientation Provided & Reviewed with Pt/Caregiver Including Roles  & Responsibilities: Yes Additional Information Needs: barrier to d/c is 2nd level apartment  Decrease burden of Care through IP rehab admission: n/a  Possible need for SNF placement upon discharge: not anticipated  Patient Condition: I have reviewed medical records from [facility], spoken with CM, and patient and spouse. I met with patient at the bedside and discussed via phone for inpatient rehabilitation assessment.  Patient will benefit from ongoing PT, OT, and SLP, can actively participate in  3 hours of therapy a day 5 days of the week, and can make measurable gains during the admission.  Patient will also benefit from the coordinated team approach during an Inpatient Acute Rehabilitation admission.  The patient will receive intensive therapy as well as Rehabilitation physician, nursing, social worker, and care management interventions.  Due to bladder management, bowel management, safety, skin/wound care, disease management, medication administration, pain management, and patient education the patient requires 24 hour a day rehabilitation nursing.  The patient is currently *** with mobility and basic ADLs.  Discharge setting and therapy post discharge at home with home health is anticipated.  Patient has agreed to participate in the Acute Inpatient Rehabilitation Program and will admit today.  Preadmission Screen Completed By:  Clois Dupes, RN MSN 07/30/2022 6:35 PM ______________________________________________________________________   Discussed status with Dr. Marland Kitchen on *** at *** and received approval for admission today.  Admission Coordinator:  Clois Dupes, RN MSN time Marland KitchenDorna Bloom ***   Assessment/Plan: Diagnosis: Does the need for close, 24  hr/day Medical supervision in concert with the patient's rehab needs make it unreasonable for this patient to be served in a less intensive setting? {yes_no_potentially:3041433} Co-Morbidities requiring supervision/potential complications: *** Due to {due WJ:1914782}, does the patient require 24 hr/day rehab nursing? {yes_no_potentially:3041433} Does the patient require coordinated care of a physician, rehab nurse, PT, OT, and SLP to address physical and functional deficits in the context of the above medical diagnosis(es)? {yes_no_potentially:3041433} Addressing deficits in the following areas: {deficits:3041436} Can the patient actively participate in an intensive therapy program of at least 3 hrs of therapy 5 days a week?  {yes_no_potentially:3041433} The potential for patient to make measurable gains while on inpatient rehab is {potential:3041437} Anticipated functional outcomes upon discharge from inpatient rehab: {functional outcomes:304600100} PT, {functional outcomes:304600100} OT, {functional outcomes:304600100} SLP Estimated rehab length of stay to reach the above functional goals is: *** Anticipated discharge destination: {anticipated dc setting:21604} 10. Overall Rehab/Functional Prognosis: {potential:3041437}   MD Signature: ***

## 2022-07-29 NOTE — Progress Notes (Signed)
Pt. Refused cpap. 

## 2022-07-29 NOTE — Progress Notes (Signed)
Physical Therapy Treatment Patient Details Name: Joseph Bright MRN: [id number] DOB: 1976/02/23 Today's Date: 07/29/2022   History of Present Illness Pt is a 47 year old male admitted 07/25/22 with sudden onset of aphasia and right-sided weakness, stroke code was called, noted to have L ACA occlusion, received TNK and underwent mechanical thrombectomy with TICI 3 result, with reocclusion resulting in stent placement.  PMH of HTN, sleep apnea.    PT Comments    Pt continues to make steady progress. Remains highly motivated to work toward independence. Improving balance and ability to compensate for rt sided weakness. Patient will benefit from intensive inpatient follow up therapy, >3 hours/day    Recommendations for follow up therapy are one component of a multi-disciplinary discharge planning process, led by the attending physician.  Recommendations may be updated based on patient status, additional functional criteria and insurance authorization.  Follow Up Recommendations       Assistance Recommended at Discharge Frequent or constant Supervision/Assistance  Patient can return home with the following Two people to help with walking and/or transfers;Two people to help with bathing/dressing/bathroom;Assistance with feeding;Help with stairs or ramp for entrance;Direct supervision/assist for medications management;Direct supervision/assist for financial management;Assist for transportation;Assistance with cooking/housework   Equipment Recommendations  Other (comment) (TBA at next venue)    Recommendations for Other Services       Precautions / Restrictions Precautions Precautions: Fall Precaution Comments: right sided hemiparesis, receptive/expressive deficits Restrictions Weight Bearing Restrictions: No     Mobility  Bed Mobility Overal bed mobility: Needs Assistance Bed Mobility: Supine to Sit     Supine to sit: Mod assist, HOB elevated     General bed mobility comments:  Assist to bring RLE off of bed and elevate trunk into sitting.    Transfers Overall transfer level: Needs assistance Equipment used: Ambulation equipment used (Stedy, back of chair) Transfers: Sit to/from Stand Sit to Stand: Min assist, Mod assist, From elevated surface           General transfer comment: Stood x 3 with mod assist from elevated bed using chair back for LUE support. Assist to power up and to block/stabilize rt knee and push rt hip into extension. Used Stedy with pt coming to stand with min assist to power up. Stood x 6 with [employer]. Used Stedy for bed to chair. Transfer via Lift Equipment: Stedy  Ambulation/Gait             Pre-gait activities: Lateral weight shift standing with chair back support requiring mod assist to block/support RLE. No quad activation on rt. Lateral wt shifting in [place] with manual facilitation to try and activate musculature.     Stairs             Wheelchair Mobility    Modified Rankin (Stroke Patients Only) Modified Rankin (Stroke Patients Only) Pre-Morbid Rankin Score: No symptoms Modified Rankin: Severe disability     Balance Overall balance assessment: Needs assistance Sitting-balance support: Single extremity supported, Feet supported Sitting balance-Leahy Scale: Poor Sitting balance - Comments: Pt reliant on L UE support to sit EOB statically, min guard for safety Postural control: Right lateral lean Standing balance support: Bilateral upper extremity supported, During functional activity, Reliant on assistive device for balance Standing balance-Leahy Scale: Poor Standing balance comment: Stedy and min assist for static standing. Mod assist standing using chair back                            Cognition Arousal/Alertness:  Awake/alert Behavior During Therapy: WFL for tasks assessed/performed Overall Cognitive Status: Difficult to assess Area of Impairment: Attention, Following commands                    Current Attention Level: Selective   Following Commands: Follows one step commands consistently       General Comments: Able to answer "Tarheels" when asked who his college team was. Also did answer "no" appropriately to 2 questions.        Exercises      General Comments        Pertinent Vitals/Pain Pain Assessment Pain Assessment: Faces Faces Pain Scale: No hurt    Home Living                          Prior Function            PT Goals (current goals can now be found in the care plan section) Acute Rehab PT Goals Patient Stated Goal: agreeable to session Progress towards PT goals: Progressing toward goals    Frequency    Min 4X/week      PT Plan Current plan remains appropriate    Co-evaluation PT/OT/SLP Co-Evaluation/Treatment:  (+1 for Stedy, +2 for other))            AM-PAC PT "6 Clicks" Mobility   Outcome Measure  Help needed turning from your back to your side while in a flat bed without using bedrails?: A Lot Help needed moving from lying on your back to sitting on the side of a flat bed without using bedrails?: A Lot Help needed moving to and from a bed to a chair (including a wheelchair)?: Total Help needed standing up from a chair using your arms (e.g., wheelchair or bedside chair)?: A Lot Help needed to walk in hospital room?: Total Help needed climbing 3-5 steps with a railing? : Total 6 Click Score: 9    End of Session Equipment Utilized During Treatment: Gait belt Activity Tolerance: Patient tolerated treatment well Patient left: in chair;with call bell/phone within reach;with chair alarm set Nurse Communication: Mobility status;Need for lift equipment PT Visit Diagnosis: Other symptoms and signs involving the nervous system (R29.898);Other abnormalities of gait and mobility (R26.89);Hemiplegia and hemiparesis;Unsteadiness on feet (R26.81);Muscle weakness (generalized) (M62.81);Difficulty in walking, not elsewhere  classified (R26.2) Hemiplegia - Right/Left: Right Hemiplegia - dominant/non-dominant: Dominant Hemiplegia - caused by: Cerebral infarction     Time: 1191-4782 PT Time Calculation (min) (ACUTE ONLY): 26 min  Charges:  $Therapeutic Activity: 23-37 mins                     [facility] PT Acute Rehabilitation Services Office [phone]    Angelina Ok [facility] 07/29/2022, 12:32 PM

## 2022-07-30 DIAGNOSIS — I63522 Cerebral infarction due to unspecified occlusion or stenosis of left anterior cerebral artery: Secondary | ICD-10-CM | POA: Diagnosis not present

## 2022-07-30 LAB — GLUCOSE, CAPILLARY
Glucose-Capillary: 94 mg/dL (ref 70–99)
Glucose-Capillary: 98 mg/dL (ref 70–99)
Glucose-Capillary: 87 mg/dL (ref 70–99)
Glucose-Capillary: 97 mg/dL (ref 70–99)
Glucose-Capillary: 110 mg/dL — ABNORMAL HIGH (ref 70–99)

## 2022-07-30 LAB — BASIC METABOLIC PANEL
Anion gap: 7 (ref 5–15)
BUN: 57 mg/dL — ABNORMAL HIGH (ref 6–20)
CO2: 22 mmol/L (ref 22–32)
Calcium: 9 mg/dL (ref 8.9–10.3)
Chloride: 107 mmol/L (ref 98–111)
Creatinine, Ser: 3.05 mg/dL — ABNORMAL HIGH (ref 0.61–1.24)
GFR, Estimated: 25 mL/min — ABNORMAL LOW (ref >60)
Glucose, Bld: 101 mg/dL — ABNORMAL HIGH (ref 70–99)
Potassium: 5 mmol/L (ref 3.5–5.1)
Sodium: 136 mmol/L (ref 135–145)

## 2022-07-30 LAB — CBC
HCT: 42 % (ref 39.0–52.0)
Hemoglobin: 14 g/dL (ref 13.0–17.0)
MCH: 30.8 pg (ref 26.0–34.0)
MCHC: 33.3 g/dL (ref 30.0–36.0)
MCV: 92.5 fL (ref 80.0–100.0)
Platelets: 219 10*3/uL (ref 150–400)
RBC: 4.54 MIL/uL (ref 4.22–5.81)
RDW: 13.2 % (ref 11.5–15.5)
WBC: 8 10*3/uL (ref 4.0–10.5)
nRBC: 0 % (ref 0.0–0.2)

## 2022-07-30 LAB — RAPID URINE DRUG SCREEN, HOSP PERFORMED
Amphetamines: NOT DETECTED
Barbiturates: NOT DETECTED
Benzodiazepines: NOT DETECTED
Cocaine: NOT DETECTED
Opiates: NOT DETECTED
Tetrahydrocannabinol: NOT DETECTED

## 2022-07-30 NOTE — Plan of Care (Signed)
  Problem: Education: Goal: Knowledge of disease or condition will improve Outcome: Progressing   Problem: Ischemic Stroke/TIA Tissue Perfusion: Goal: Complications of ischemic stroke/TIA will be minimized Outcome: Progressing   Problem: Coping: Goal: Will verbalize positive feelings about self Outcome: Not Progressing Goal: Will identify appropriate support needs Outcome: Not Progressing   Problem: Health Behavior/Discharge Planning: Goal: Ability to manage health-related needs will improve Outcome: Not Progressing   Problem: Self-Care: Goal: Ability to participate in self-care as condition permits will improve Outcome: Not Progressing Goal: Verbalization of feelings and concerns over difficulty with self-care will improve Outcome: Progressing Goal: Ability to communicate needs accurately will improve Outcome: Progressing   Problem: Nutrition: Goal: Risk of aspiration will decrease Outcome: Progressing Goal: Dietary intake will improve Outcome: Progressing   Problem: Education: Goal: Knowledge of disease or condition will improve Outcome: Progressing Goal: Knowledge of secondary prevention will improve (MUST DOCUMENT ALL) Outcome: Progressing Goal: Knowledge of patient specific risk factors will improve Loraine Leriche N/A or DELETE if not current risk factor) Outcome: Progressing   Problem: Ischemic Stroke/TIA Tissue Perfusion: Goal: Complications of ischemic stroke/TIA will be minimized Outcome: Progressing   Problem: Coping: Goal: Will verbalize positive feelings about self Outcome: Progressing Goal: Will identify appropriate support needs Outcome: Progressing   Problem: Health Behavior/Discharge Planning: Goal: Ability to manage health-related needs will improve Outcome: Progressing Goal: Goals will be collaboratively established with patient/family Outcome: Progressing   Problem: Self-Care: Goal: Ability to participate in self-care as condition permits will  improve Outcome: Not Progressing Goal: Verbalization of feelings and concerns over difficulty with self-care will improve Outcome: Not Progressing Goal: Ability to communicate needs accurately will improve Outcome: Not Progressing   Problem: Education: Goal: Understanding of CV disease, CV risk reduction, and recovery process will improve Outcome: Progressing Goal: Individualized Educational Video(s) Outcome: Progressing   Problem: Activity: Goal: Ability to return to baseline activity level will improve Outcome: Not Progressing   Problem: Cardiovascular: Goal: Ability to achieve and maintain adequate cardiovascular perfusion will improve Outcome: Progressing Goal: Vascular access site(s) Level 0-1 will be maintained Outcome: Progressing   Problem: Health Behavior/Discharge Planning: Goal: Ability to safely manage health-related needs after discharge will improve Outcome: Progressing   Problem: Education: Goal: Knowledge of General Education information will improve Description: Including pain rating scale, medication(s)/side effects and non-pharmacologic comfort measures Outcome: Not Progressing   Problem: Health Behavior/Discharge Planning: Goal: Ability to manage health-related needs will improve Outcome: Not Progressing   Problem: Clinical Measurements: Goal: Ability to maintain clinical measurements within normal limits will improve Outcome: Progressing Goal: Will remain free from infection Outcome: Progressing Goal: Diagnostic test results will improve Outcome: Progressing Goal: Respiratory complications will improve Outcome: Progressing Goal: Cardiovascular complication will be avoided Outcome: Progressing   Problem: Activity: Goal: Risk for activity intolerance will decrease Outcome: Progressing   Problem: Nutrition: Goal: Adequate nutrition will be maintained Outcome: Progressing   Problem: Coping: Goal: Level of anxiety will decrease Outcome:  Progressing   Problem: Elimination: Goal: Will not experience complications related to bowel motility Outcome: Progressing Goal: Will not experience complications related to urinary retention Outcome: Progressing   Problem: Skin Integrity: Goal: Risk for impaired skin integrity will decrease Outcome: Progressing   Problem: Education: Goal: Knowledge of secondary prevention will improve (MUST DOCUMENT ALL) Outcome: Progressing

## 2022-07-30 NOTE — Plan of Care (Signed)
Problem: Education: Goal: Knowledge of disease or condition will improve Outcome: Progressing Goal: Knowledge of secondary prevention will improve (MUST DOCUMENT ALL) Outcome: Progressing Goal: Knowledge of patient specific risk factors will improve (Mark N/A or DELETE if not current risk factor) Outcome: Progressing   Problem: Ischemic Stroke/TIA Tissue Perfusion: Goal: Complications of ischemic stroke/TIA will be minimized Outcome: Progressing   Problem: Coping: Goal: Will verbalize positive feelings about self Outcome: Progressing Goal: Will identify appropriate support needs Outcome: Progressing   Problem: Health Behavior/Discharge Planning: Goal: Ability to manage health-related needs will improve Outcome: Progressing Goal: Goals will be collaboratively established with patient/family Outcome: Progressing   Problem: Self-Care: Goal: Ability to participate in self-care as condition permits will improve Outcome: Progressing Goal: Verbalization of feelings and concerns over difficulty with self-care will improve Outcome: Progressing Goal: Ability to communicate needs accurately will improve Outcome: Progressing   Problem: Nutrition: Goal: Risk of aspiration will decrease Outcome: Progressing Goal: Dietary intake will improve Outcome: Progressing   Problem: Education: Goal: Understanding of CV disease, CV risk reduction, and recovery process will improve Outcome: Progressing Goal: Individualized Educational Video(s) Outcome: Progressing   Problem: Activity: Goal: Ability to return to baseline activity level will improve Outcome: Progressing   Problem: Cardiovascular: Goal: Ability to achieve and maintain adequate cardiovascular perfusion will improve Outcome: Progressing Goal: Vascular access site(s) Level 0-1 will be maintained Outcome: Progressing   Problem: Health Behavior/Discharge Planning: Goal: Ability to safely manage health-related needs after  discharge will improve Outcome: Progressing   Problem: Education: Goal: Knowledge of General Education information will improve Description: Including pain rating scale, medication(s)/side effects and non-pharmacologic comfort measures Outcome: Progressing   Problem: Health Behavior/Discharge Planning: Goal: Ability to manage health-related needs will improve Outcome: Progressing   Problem: Clinical Measurements: Goal: Ability to maintain clinical measurements within normal limits will improve Outcome: Progressing Goal: Will remain free from infection Outcome: Progressing Goal: Diagnostic test results will improve Outcome: Progressing Goal: Respiratory complications will improve Outcome: Progressing Goal: Cardiovascular complication will be avoided Outcome: Progressing   Problem: Activity: Goal: Risk for activity intolerance will decrease Outcome: Progressing   Problem: Nutrition: Goal: Adequate nutrition will be maintained Outcome: Progressing   Problem: Coping: Goal: Level of anxiety will decrease Outcome: Progressing   Problem: Elimination: Goal: Will not experience complications related to bowel motility Outcome: Progressing Goal: Will not experience complications related to urinary retention Outcome: Progressing   Problem: Pain Managment: Goal: General experience of comfort will improve Outcome: Progressing   Problem: Safety: Goal: Ability to remain free from injury will improve Outcome: Progressing   Problem: Skin Integrity: Goal: Risk for impaired skin integrity will decrease Outcome: Progressing   Problem: Education: Goal: Knowledge of disease or condition will improve Outcome: Progressing Goal: Knowledge of secondary prevention will improve (MUST DOCUMENT ALL) Outcome: Progressing Goal: Knowledge of patient specific risk factors will improve (Mark N/A or DELETE if not current risk factor) Outcome: Progressing   Problem: Ischemic Stroke/TIA Tissue  Perfusion: Goal: Complications of ischemic stroke/TIA will be minimized Outcome: Progressing   Problem: Coping: Goal: Will verbalize positive feelings about self Outcome: Progressing Goal: Will identify appropriate support needs Outcome: Progressing   Problem: Health Behavior/Discharge Planning: Goal: Ability to manage health-related needs will improve Outcome: Progressing Goal: Goals will be collaboratively established with patient/family Outcome: Progressing   Problem: Self-Care: Goal: Ability to participate in self-care as condition permits will improve Outcome: Progressing Goal: Verbalization of feelings and concerns over difficulty with self-care will improve Outcome: Progressing Goal: Ability to communicate   needs accurately will improve Outcome: Progressing   Problem: Nutrition: Goal: Risk of aspiration will decrease Outcome: Progressing Goal: Dietary intake will improve Outcome: Progressing   Problem: Education: Goal: Knowledge of secondary prevention will improve (MUST DOCUMENT ALL) Outcome: Progressing   

## 2022-07-30 NOTE — Progress Notes (Signed)
ASTROKE TEAM PROGRESS NOTE   INTERVAL HISTORY Patient is seen in his room with no family at the bedside.  Neurological exam is unchanged the persistent dense right hemiplegia No complaints today.  Vital signs stable Vitals:   07/29/22 2300 07/29/22 2356 07/30/22 0757 07/30/22 1225  BP: (!) 153/115 (!) 167/101 (!) 166/99 (!) 160/92  Pulse: 65 61 61 78  Resp: 17 16 20 18   Temp: 98.1 F (36.7 C)  98.2 F (36.8 C) 98 F (36.7 C)  TempSrc: Oral  Oral Oral  SpO2: 99%  100% 100%  Weight:      Height:       CBC:  Recent Labs  Lab 07/25/22 0718 07/25/22 0723 07/26/22 0500 07/27/22 0624 07/29/22 0548 07/30/22 0247  WBC 4.2  --  14.8*   < > 7.9 8.0  NEUTROABS 2.2  --  12.8*  --   --   --   HGB 13.3   < > 13.1   < > 13.6 14.0  HCT 39.4   < > 37.1*   < > 42.3 42.0  MCV 93.1  --  92.1   < > 94.2 92.5  PLT 206  --  236   < > 221 219   < > = values in this interval not displayed.   Basic Metabolic Panel:  Recent Labs  Lab 07/26/22 0500 07/26/22 0545 07/27/22 0624 07/27/22 1936 07/28/22 0704 07/29/22 0548 07/30/22 0247  NA  --    < > 140  --    < > 139 136  K  --    < > 4.3  --    < > 5.2* 5.0  CL  --    < > 109  --    < > 107 107  CO2  --    < > 22  --    < > 21* 22  GLUCOSE  --    < > 133*  --    < > 101* 101*  BUN  --    < > 41*  --    < > 47* 57*  CREATININE  --    < > 3.23*  --    < > 2.96* 3.05*  CALCIUM  --    < > 9.0  --    < > 9.2 9.0  MG 1.7  --  2.2  --   --   --   --   PHOS  --    < > 4.0 3.9  --   --   --    < > = values in this interval not displayed.   Lipid Panel:  Recent Labs  Lab 07/27/22 0624 07/29/22 0548  CHOL 165  --   TRIG 313* 265*  HDL 28*  --   CHOLHDL 5.9  --   VLDL 63*  --   LDLCALC 74  --    HgbA1c:  Recent Labs  Lab 07/25/22 1153  HGBA1C 5.6   Urine Drug Screen:  Recent Labs  Lab 07/26/22 0600  LABOPIA NONE DETECTED  COCAINSCRNUR NONE DETECTED  LABBENZ NONE DETECTED  AMPHETMU NONE DETECTED  THCU NONE DETECTED  LABBARB  NONE DETECTED    Alcohol Level  Recent Labs  Lab 07/25/22 0718  ETH <10    IMAGING past 24 hours No results found.  PHYSICAL EXAM General: Drowsy, well-nourished, well-developed patient in no acute distress Respiratory: Regular, unlabored respirations Neurological:  Patient is alert and responds to name.  He is able  to follow simple commands on the left side.  Extraocular movements intact, pupils equal round and reactive, slight right facial droop, able to move left side with good antigravity strength but dense right hemiplegia with no movement noted on the right side.   ASSESSMENT/PLAN Joseph Bright is a 47 y.o. male with history of hypertension and back pain presenting with sudden onset aphasia and right-sided weakness.  He was brought to the ED and given TNK to treat stroke, and CTA showed left ACA occlusion.  Patient was taken to IR and TICI 3 flow was achieved in left ACA.  Unfortunately, this reoccluded and rescue stenting was performed.  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 Small vessel disease. ASPECTS 10.    CTA head & neck acute occlusion of left ACA A2 segment IR - left A2 occlusion with TICI3 and reocclusion s/p stenting MRI confluent left ACA territory infarct with multiple scattered punctate ischemic foci in right MCA PCA and cerebellar artery territories MRA attenuated left ACA proximal segment, likely due to vascular stent artifact 2D Echo EF 70 to 75% TEE unremarkable, no PFO, no cardioembolic source S/p loop recorder 07/28/22 LDL 74, TG 313 HgbA1c 5.6 VTE prophylaxis -SQH No antithrombotic prior to admission, now on aspirin 81 mg daily and Brilinta (ticagrelor) 90 mg bid due to stent placement Therapy recommendations:  CIR Disposition: pending CIR   Hypertension Home meds: Amlodipine 10 mg daily, hydralazine 50 mg twice daily, labetalol 200 mg twice daily Stable now Keep systolic blood pressure less than 180 Long-term BP goal normotensive  Hyperlipidemia Home meds: Simvastatin 20 mg daily, not compliant LDL 74, goal < 70 Now on Crestor 20 Continue statin at discharge  Seizure like activity Patient had focal seizure overnight consisting of right upper extremity shaking followed by worsening aphasia for about 2 minutes EEG shows no seizures or epileptiform discharges Continue Keppra 500 mg twice daily  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-> 2.79-> 2.96 Baseline CKD Continue to follow-up with outpatient nephrology  Dysphagia Was on tube feeding per core track Also on full liquid diet per speech therapist 5/2 Core track removed during TEE procedure Passed bedside swallow screen 5/3 Put on dys1 and thin liquid Speech following  Other Stroke Risk Factors Former cigarette smoker Obesity, Body mass index is 38.32 kg/m., BMI >/= 30 associated with increased stroke risk, recommend weight loss, diet and exercise as appropriate  Obstructive sleep apnea, not on CPAP at home  Hyperkalemia Potassium 5.2 today in the setting of elevated creatinine Will check EKG BMP in the morning  Other Active Problems Leukocytosis WBC 14.8-> 8.7-> 8.0-> 7.9 -> stable  Hospital day # 5     Patient neurological exam remains stable.    Awaits transfer to inpatient rehab when bed available.  Medically stable for transfer hopefully in the next few days.  No family at the bedside.    Delia Heady, MD Medical Director [facility] Pager: [phone] 07/30/2022 12:57 PM  To contact Stroke Continuity provider, please refer to [email or web address]. After hours, contact General Neurology

## 2022-07-31 ENCOUNTER — Telehealth: Payer: Self-pay

## 2022-07-31 ENCOUNTER — Encounter (HOSPITAL_COMMUNITY): Payer: Self-pay | Admitting: Cardiology

## 2022-07-31 DIAGNOSIS — I63522 Cerebral infarction due to unspecified occlusion or stenosis of left anterior cerebral artery: Secondary | ICD-10-CM | POA: Diagnosis not present

## 2022-07-31 LAB — BASIC METABOLIC PANEL WITH GFR
Anion gap: 10 (ref 5–15)
BUN: 62 mg/dL — ABNORMAL HIGH (ref 6–20)
CO2: 20 mmol/L — ABNORMAL LOW (ref 22–32)
Calcium: 9.2 mg/dL (ref 8.9–10.3)
Chloride: 108 mmol/L (ref 98–111)
Creatinine, Ser: 3.01 mg/dL — ABNORMAL HIGH (ref 0.61–1.24)
GFR, Estimated: 25 mL/min — ABNORMAL LOW
Glucose, Bld: 104 mg/dL — ABNORMAL HIGH (ref 70–99)
Potassium: 5.4 mmol/L — ABNORMAL HIGH (ref 3.5–5.1)
Sodium: 138 mmol/L (ref 135–145)

## 2022-07-31 LAB — CBC
HCT: 43.1 % (ref 39.0–52.0)
Hemoglobin: 14.6 g/dL (ref 13.0–17.0)
MCH: 31.1 pg (ref 26.0–34.0)
MCHC: 33.9 g/dL (ref 30.0–36.0)
MCV: 91.9 fL (ref 80.0–100.0)
Platelets: 232 K/uL (ref 150–400)
RBC: 4.69 MIL/uL (ref 4.22–5.81)
RDW: 13.2 % (ref 11.5–15.5)
WBC: 9.4 K/uL (ref 4.0–10.5)
nRBC: 0 % (ref 0.0–0.2)

## 2022-07-31 LAB — GLUCOSE, CAPILLARY
Glucose-Capillary: 105 mg/dL — ABNORMAL HIGH (ref 70–99)
Glucose-Capillary: 86 mg/dL (ref 70–99)
Glucose-Capillary: 93 mg/dL (ref 70–99)
Glucose-Capillary: 93 mg/dL (ref 70–99)
Glucose-Capillary: 98 mg/dL (ref 70–99)
Glucose-Capillary: 99 mg/dL (ref 70–99)

## 2022-07-31 LAB — ANA W/REFLEX IF POSITIVE: Anti Nuclear Antibody (ANA): NEGATIVE

## 2022-07-31 LAB — RPR: RPR Ser Ql: NONREACTIVE

## 2022-07-31 MED ORDER — SODIUM ZIRCONIUM CYCLOSILICATE 5 G PO PACK
5.0000 g | PACK | Freq: Once | ORAL | Status: AC
  Administered 2022-07-31: 5 g via ORAL
  Filled 2022-07-31: qty 1

## 2022-07-31 MED ORDER — LEVETIRACETAM ER 500 MG PO TB24
500.0000 mg | ORAL_TABLET | Freq: Every day | ORAL | Status: DC
  Administered 2022-07-31: 500 mg via ORAL
  Filled 2022-07-31 (×2): qty 1

## 2022-07-31 MED ORDER — LEVETIRACETAM ER 500 MG PO TB24
500.0000 mg | ORAL_TABLET | Freq: Every day | ORAL | Status: DC
  Filled 2022-07-31: qty 1

## 2022-07-31 MED ORDER — TICAGRELOR 90 MG PO TABS: 90.0000 mg | ORAL_TABLET | Freq: Two times a day (BID) | ORAL | 2 refills | Status: DC

## 2022-07-31 NOTE — Progress Notes (Signed)
Inpatient Rehab Admissions Coordinator:   Following for my colleague, Ottie Glazier.  Looks to have improved tolerance over the weekend.  Await updated therapy notes and will begin insurance auth today.   Estill Dooms, PT, DPT Admissions Coordinator [phone] 07/31/22  10:09 AM

## 2022-07-31 NOTE — Progress Notes (Signed)
Speech Language Pathology Treatment: Dysphagia  Patient Details Name: Joseph Bright MRN: [id number] DOB: 12/12/1975 Today's Date: 07/31/2022 Time: 4098-1191 SLP Time Calculation (min) (ACUTE ONLY): 24 min  Assessment / Plan / Recommendation Clinical Impression  Joseph Bright has made gains in both swallowing and speech/language since last visit. Continues to perseverate on motor/speech tasks, but shows improved ability to cease then shift output with max verbal/visual cues.  Producing more spontaneous language in small bursts.  Improved ability to f/c and discriminate among items in room.  Responds to visual and phonemic cues for word retrieval during basic naming.  Enthusiastically accepted trials of regular solids and demonstrated only mild oral residue in right lateral sulcus, otherwise with normal oral attention and active mastication.  No s/s of aspiration with thin liquids or when simultaneously swallowing regular solids and water. Recommend advancing diet to regular/thin liquids. Pt was very pleased - able to say that he wanted a caesar salad and cola without prompting.  Orders written and menu order placed.  SLP will continue to follow pending D/C.   HPI HPI: Patient is a 47 y.o. male with PMH: HTN, noncompliance with medications who was brought to ED with sudden onset of aphasia and right sided weakness. MRI brain showeed left ACA territory infarct, cytotoxic edema with no hemorrhagic transformation, multiple additional scattered mostly punctate ischemic foci in  the left greater than right MCA, PCA, and occasionally cerebellar artery territories; No significant intracranial mass effect. He received TNK and underwent mechanical thrombectomy on 07/25/22. He remained intubated following procedure and was extubated same day at 1644.      SLP Plan  Continue with current plan of care      Recommendations for follow up therapy are one component of a multi-disciplinary discharge planning process,  led by the attending physician.  Recommendations may be updated based on patient status, additional functional criteria and insurance authorization.    Recommendations  Diet recommendations: Regular;Thin liquid Liquids provided via: Cup;Straw Medication Administration: Whole meds with liquid Supervision: Patient able to self feed Compensations: Lingual sweep for clearance of pocketing Postural Changes and/or Swallow Maneuvers: Seated upright 90 degrees                  Oral care BID   Frequent or constant Supervision/Assistance Dysphagia, unspecified (R13.10);Aphasia (R47.01)     Continue with current plan of care    Joseph Tinnel L. Samson Frederic, MA CCC/SLP Clinical Specialist - Acute Care SLP Acute Rehabilitation Services Office number [phone]  Joseph Bright  07/31/2022, 1:55 PM

## 2022-07-31 NOTE — Progress Notes (Addendum)
STROKE TEAM PROGRESS NOTE   INTERVAL HISTORY Patient is seen in his room with no family at bedside.  He has been hemodynamically stable overnight, and neurological exam still reveals dense right hemiplegia.  His potassium was noted to be elevated to 5.4 today, Lokelma given.  Will recheck tomorrow. Vitals:   07/30/22 1951 07/31/22 0300 07/31/22 0800 07/31/22 1131  BP: (!) 150/101 (!) 150/107 (!) 130/91 (!) 162/97  Pulse: (!) 101 73 73 69  Resp: 18 18 19 19   Temp: 98.3 F (36.8 C) 98 F (36.7 C) 99.2 F (37.3 C) 98.9 F (37.2 C)  TempSrc: Oral Oral Oral Oral  SpO2: 100% 98% 99% 99%  Weight:      Height:       CBC:  Recent Labs  Lab 07/25/22 0718 07/25/22 0723 07/26/22 0500 07/27/22 0624 07/30/22 0247 07/31/22 0342  WBC 4.2  --  14.8*   < > 8.0 9.4  NEUTROABS 2.2  --  12.8*  --   --   --   HGB 13.3   < > 13.1   < > 14.0 14.6  HCT 39.4   < > 37.1*   < > 42.0 43.1  MCV 93.1  --  92.1   < > 92.5 91.9  PLT 206  --  236   < > 219 232   < > = values in this interval not displayed.    Basic Metabolic Panel:  Recent Labs  Lab 07/26/22 0500 07/26/22 0545 07/27/22 0624 07/27/22 1936 07/28/22 0704 07/30/22 0247 07/31/22 0342  NA  --    < > 140  --    < > 136 138  K  --    < > 4.3  --    < > 5.0 5.4*  CL  --    < > 109  --    < > 107 108  CO2  --    < > 22  --    < > 22 20*  GLUCOSE  --    < > 133*  --    < > 101* 104*  BUN  --    < > 41*  --    < > 57* 62*  CREATININE  --    < > 3.23*  --    < > 3.05* 3.01*  CALCIUM  --    < > 9.0  --    < > 9.0 9.2  MG 1.7  --  2.2  --   --   --   --   PHOS  --    < > 4.0 3.9  --   --   --    < > = values in this interval not displayed.    Lipid Panel:  Recent Labs  Lab 07/27/22 0624 07/29/22 0548  CHOL 165  --   TRIG 313* 265*  HDL 28*  --   CHOLHDL 5.9  --   VLDL 63*  --   LDLCALC 74  --     HgbA1c:  Recent Labs  Lab 07/25/22 1153  HGBA1C 5.6    Urine Drug Screen:  Recent Labs  Lab 07/30/22 1231  LABOPIA  NONE DETECTED  COCAINSCRNUR NONE DETECTED  LABBENZ NONE DETECTED  AMPHETMU NONE DETECTED  THCU NONE DETECTED  LABBARB NONE DETECTED     Alcohol Level  Recent Labs  Lab 07/25/22 0718  ETH <10     IMAGING past 24 hours No results found.  PHYSICAL EXAM General: Drowsy,  well-nourished, well-developed patient in no acute distress Respiratory: Regular, unlabored respirations  NEURO:  Mental Status: AA&Ox3  Speech/Language: Speech is in single words and short phrases with some word finding difficulties.  He is able to repeat phrases and name objects.  He names little finger as pointer finger but gets other objects correct.  Cranial Nerves:  II: PERRL.  III, IV, VI: EOMI. Eyelids elevate symmetrically.  V: Sensation is intact to light touch and symmetrical to face.  VII: Slight right facial droop VIII: hearing intact to voice. IX, X: Phonation is normal.  [id number] shrug 5/5. XII: tongue is midline without fasciculations. Motor: 5/5 strength to left upper and lower extremities, no movement of right upper and lower extremities Tone: is normal and bulk is normal Sensation- Intact to light touch bilaterally.      ASSESSMENT/PLAN [name] is a 47 y.o. male with history of hypertension and back pain presenting with sudden onset aphasia and right-sided weakness.  He was brought to the ED and given TNK to treat stroke, and CTA showed left ACA occlusion.  Patient was taken to IR and TICI 3 flow was achieved in left ACA.  Unfortunately, this reoccluded and rescue stenting was performed.  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 Small vessel disease. ASPECTS 10.    CTA head & neck acute occlusion of left ACA A2 segment IR - left A2 occlusion  with TICI3 and reocclusion s/p stenting MRI confluent left ACA territory infarct with multiple scattered punctate ischemic foci in right MCA PCA and cerebellar artery territories MRA attenuated left ACA proximal segment, likely due to vascular stent artifact 2D Echo EF 70 to 75% TEE unremarkable, no PFO, no cardioembolic source S/p loop recorder 07/28/22 LDL 74, TG 313 HgbA1c 5.6 VTE prophylaxis -SQH No antithrombotic prior to admission, now on aspirin 81 mg daily and Brilinta (ticagrelor) 90 mg bid due to stent placement Therapy recommendations: CIR Disposition: pending CIR   Hypertension Home meds: Amlodipine 10 mg daily, hydralazine 50 mg twice daily, labetalol 200 mg twice daily Stable now Keep systolic blood pressure less than 180 Long-term BP goal normotensive  Hyperlipidemia Home meds: Simvastatin 20 mg daily, not compliant LDL 74, goal < 70 Now on Crestor 20 Continue statin at discharge  Seizure like activity Patient had focal seizure overnight consisting of right upper extremity shaking followed by worsening aphasia for about 2 minutes EEG shows no seizures or epileptiform discharges Continue Keppra 500 mg twice daily  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-> 2.79-> 2.96-> 3.01 Baseline CKD Continue to follow-up with outpatient nephrology  Dysphagia Was on tube feeding per core track Also on full liquid diet per speech therapist 5/2 Core track removed during TEE procedure Passed bedside swallow screen 5/3 Put on dys1 and thin liquid Speech following  Other Stroke Risk Factors Former cigarette smoker Obesity, Body mass index is 38.32 kg/m., BMI >/= 30 associated with increased stroke risk, recommend weight loss, diet and exercise as appropriate  Obstructive sleep apnea, not on CPAP at home  Hyperkalemia Potassium 5.2 on 5/4 in the setting of elevated creatinine Potassium 5.4 on 5/6, 5 g of Lokelma given BMP in the morning  Other Active Problems Leukocytosis WBC  14.8-> 8.7-> 8.0-> 7.9 -> stable  Hospital day # 6  Patient is  seen by NP and then by MD, MD to edit note is needed Joseph Bright , MSN, AGACNP-BC [facility] See Amion for schedule and pager information 07/31/2022 11:46 AM  I have personally obtained history,examined this patient, reviewed notes, independently viewed imaging studies, participated in medical decision making and plan of care.ROS completed by me personally and pertinent positives fully documented  I have made any additions or clarifications directly to the above note. Agree with note above.  Patient is awaiting transfer to rehab.  Will correct potassium today.  Reduce dose of Keppra to 500 mg XR daily.  No family at the bedside.  Greater than 50% time during this 35-minute visit was spent in counseling and coordination of care discussion about stroke, hyperkalemia and renal failure seizures and answering questions  Delia Heady, MD Medical Director [facility] Pager: [phone] 07/31/2022 1:28 PM   To contact Stroke Continuity provider, please refer to [email or web address]. After hours, contact General Neurology

## 2022-07-31 NOTE — Progress Notes (Signed)
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  PMH of HTN, sleep apnea.   OT comments  Patient making good progress with OT treatment. Patient able to get to EOB with mod assist due to assistance needed with RLE and min assist to stand from raised bed into steady and within stedy. Patient performed grooming seated in recliner with education on hemi technique and instructed in SROM for RUE shoulder and elbow ROM.Patient will benefit from intensive inpatient follow up therapy, >3 hours/day to address self care, functional transfers, and RUE HEP. Acute OT to continue to follow.    Recommendations for follow up therapy are one component of a multi-disciplinary discharge planning process, led by the attending physician.  Recommendations may be updated based on patient status, additional functional criteria and insurance authorization.    Assistance Recommended at Discharge Frequent or constant Supervision/Assistance  Patient can return home with the following  A lot of help with walking and/or transfers;A lot of help with bathing/dressing/bathroom;Assistance with feeding;Assist for transportation;Help with stairs or ramp for entrance;Direct supervision/assist for medications management;Assistance with cooking/housework;Direct supervision/assist for financial management   Equipment Recommendations  Other (comment) (TBD next venue of care)    Recommendations for Other Services Rehab consult    Precautions / Restrictions Precautions Precautions: Fall Precaution Comments: right sided hemiparesis, receptive/expressive  deficits Restrictions Weight Bearing Restrictions: No       Mobility Bed Mobility Overal bed mobility: Needs Assistance Bed Mobility: Supine to Sit     Supine to sit: Mod assist, HOB elevated     General bed mobility comments: verbal and tactile cues with assistance for RLE and trunk and use of bed rail    Transfers Overall transfer level: Needs assistance Equipment used: Ambulation equipment used Transfers: Sit to/from Stand, Bed to chair/wheelchair/BSC Sit to Stand: Min assist, From elevated surface           General transfer comment: min assist to stand from EOB and Stedy pads with use of LUE Transfer via Lift Equipment: Stedy   Balance Overall balance assessment: Needs assistance Sitting-balance support: Single extremity supported, Feet supported Sitting balance-Leahy Scale: Poor Sitting balance - Comments: reliant on LUE support seated on EOB   Standing balance support: Bilateral upper extremity supported, During functional activity, Reliant on assistive device for balance Standing balance-Leahy Scale: Poor Standing balance comment: reliant on stedy for support                           ADL either performed or assessed with clinical judgement   ADL Overall ADL's : Needs assistance/impaired     Grooming: Wash/dry face;Oral care;Minimal assistance;Sitting Grooming Details (indicate cue type and reason): in recliner with assistance for toothpaste and instructions on one handed techniques                                    Extremity/Trunk Assessment              Vision       Perception     Praxis      Cognition Arousal/Alertness:  Awake/alert Behavior During Therapy: WFL for tasks assessed/performed Overall Cognitive Status: Difficult to assess Area of Impairment: Attention, Following commands                   Current Attention Level: Selective   Following Commands: Follows one step commands consistently        General Comments: able to give month, year, and place with increased time        Exercises Exercises: General Upper Extremity General Exercises - Upper Extremity Shoulder Flexion: Self ROM, Right, 10 reps, Seated Shoulder ABduction: Self ROM, 10 reps, Seated, Right Shoulder Horizontal ABduction: Self ROM, Right, 10 reps, Seated Elbow Flexion: Self ROM, Right, 10 reps, Seated Elbow Extension: Self ROM, Right, 10 reps, Seated    Shoulder Instructions       General Comments      Pertinent Vitals/ Pain       Pain Assessment Pain Assessment: Faces Faces Pain Scale: Hurts a little bit Pain Location: hands L>R Pain Descriptors / Indicators: Sore Pain Intervention(s): Monitored during session  Home Living                                          Prior Functioning/Environment              Frequency  Min 2X/week        Progress Toward Goals  OT Goals(current goals can now be found in the care plan section)  Progress towards OT goals: Progressing toward goals  Acute Rehab OT Goals Patient Stated Goal: get better Time For Goal Achievement: 08/09/22 Potential to Achieve Goals: Good ADL Goals Pt Will Perform Grooming: with min assist;sitting Pt Will Perform Upper Body Bathing: with min assist;sitting Pt Will Transfer to Toilet: with mod assist;squat pivot transfer Pt Will Perform Toileting - Clothing Manipulation and hygiene: with mod assist;sit to/from stand Pt/caregiver will Perform Home Exercise Program: Increased ROM;Right Upper extremity;With written HEP provided;Independently Additional ADL Goal #1: Pt will consistently follow one step verbal commands 50% of the time during 30 minute selfcare tasks. Additional ADL Goal #2: Pt will transfer supine to sit with no more than mod assist in preparation for selfcare tasks.  Plan Discharge plan remains appropriate    Co-evaluation                 AM-PAC OT "6 Clicks" Daily Activity      Outcome Measure   Help from another person eating meals?: A Lot Help from another person taking care of personal grooming?: A Lot Help from another person toileting, which includes using toliet, bedpan, or urinal?: A Lot Help from another person bathing (including washing, rinsing, drying)?: A Lot Help from another person to put on and taking off regular upper body clothing?: A Lot Help from another person to put on and taking off regular lower body clothing?: Total 6 Click Score: 11    End of Session Equipment Utilized During Treatment: Gait belt;Other (comment) Antony Salmon)  OT Visit Diagnosis: Unsteadiness on feet (R26.81);Other abnormalities of gait and mobility (R26.89);Muscle weakness (generalized) (M62.81);Hemiplegia and hemiparesis;Other symptoms and signs involving cognitive function;Cognitive communication deficit (R41.841) Symptoms and signs involving cognitive functions: Cerebral infarction Hemiplegia - Right/Left: Right Hemiplegia - dominant/non-dominant: Dominant Hemiplegia - caused by: Cerebral infarction   Activity Tolerance Patient tolerated treatment well   Patient Left in chair;with call bell/phone within reach;with chair alarm set   Nurse  Communication Mobility status;Need for lift equipment        Time: [id number] OT Time Calculation (min): 26 min  Charges: OT General Charges $OT Visit: 1 Visit OT Treatments $Self Care/Home Management : 8-22 mins $Neuromuscular Re-education: 8-22 mins  Alfonse Flavors, OTA Acute Rehabilitation Services  Office [phone]   Dewain Penning 07/31/2022, 10:42 AM

## 2022-07-31 NOTE — Progress Notes (Addendum)
Nutrition Follow-up  DOCUMENTATION CODES:  Obesity unspecified  INTERVENTION:  Continue current diet as ordered per SLP Ensure Enlive po BID, each supplement provides 350 kcal and 20 grams of protein. Magic cup TID with meals, each supplement provides 290 kcal and 9 grams of protein  NUTRITION DIAGNOSIS:  Inadequate oral intake related to dysphagia as evidenced by NPO status. - Ongoing.   GOAL:  Patient will meet greater than or equal to 90% of their needs - Progressing.   MONITOR:  Diet advancement, TF tolerance  REASON FOR ASSESSMENT:  Consult Enteral/tube feeding initiation and management  ASSESSMENT:  Pt with PMH of HTN and back pain admitted with L ACA stroke s/p TNK and mechanical thrombectomy with rescue stenting after reocclusion.   04/30 - extubated  05/01 - failed swallow eval, s/p cortrak placement; xray pending 05/02 - diet advanced to FLD  5/3 - cortrak removed during TEE, advanced to DYS1 5/6 - SLP evaluation, regular, thin liquids  Pt resting in bed at the time of assessment. SLP worked with pt earlier today and was able to advance to a regular diet. About to eat lunch at the time of assessment. Pt still with right sided deficits and aphasia. Able to say "yes" but otherwise has difficulty finding his words. Likes ensure - will continue. Will also add magic cup as pt indicated he like ice cream.  CIR pending insurance authorization.  Average Meal Intake: 5/4: 100% intake x 1 recorded meals  Nutritionally Relevant Medications: Scheduled Meds:  docusate sodium  100 mg Oral BID   Ensure Enlive  237 mL Oral BID BM   polyethylene glycol  17 g Oral Daily   rosuvastatin  20 mg Per Tube Daily   sodium bicarbonate  650 mg Oral TID   PRN Meds: senna-docusate  Labs Reviewed: K 5.4 BUN 62, creatinine 3.01 CBG ranges from 86-110 mg/dL over the last 24 hours  NUTRITION - FOCUSED PHYSICAL EXAM: Flowsheet Row Most Recent Value  Orbital Region No depletion   Upper Arm Region No depletion  Thoracic and Lumbar Region No depletion  Buccal Region No depletion  Temple Region No depletion  Clavicle Bone Region No depletion  Clavicle and Acromion Bone Region No depletion  Scapular Bone Region No depletion  Dorsal Hand No depletion  Patellar Region No depletion  Anterior Thigh Region No depletion  Posterior Calf Region No depletion  Edema (RD Assessment) None  Hair Reviewed  Eyes Reviewed  Mouth Reviewed  Skin Reviewed  Nails Reviewed    Diet Order:   Diet Order             Diet regular Room service appropriate? Yes with Assist; Fluid consistency: Thin  Diet effective now                   EDUCATION NEEDS:  No education needs have been identified at this time  Skin:  Skin Assessment: Reviewed RN Assessment  Last BM:  5/5  Height:  Ht Readings from Last 1 Encounters:  07/25/22 5\' 9"  (1.753 m)    Weight:  Wt Readings from Last 1 Encounters:  07/30/22 117.7 kg   BMI:  Body mass index is 38.32 kg/m.  Estimated Nutritional Needs:  Kcal:  2100-2300 Protein:  105-115 grams Fluid:  >2.1 L/day   Greig Castilla, RD, LDN Clinical Dietitian RD pager # available in [facility]  After hours/weekend pager # available in [facility]

## 2022-07-31 NOTE — Progress Notes (Signed)
Pt refusing CPAP

## 2022-07-31 NOTE — Telephone Encounter (Signed)
-----   Message from Sheilah Pigeon, [place] sent at 07/28/2022  2:04 PM EDT ----- Loop implant today w/WC  Cryptogenic stroke  MDT  renee

## 2022-07-31 NOTE — TOC Progression Note (Signed)
Transition of Care [facility]) - Progression Note    Patient Details  Name: Joseph Bright MRN: [id number] Date of Birth: November 17, 1975  Transition of Care [facility]) CM/SW Contact  Kermit Balo, RN Phone Number: 07/31/2022, 10:31 AM  Clinical Narrative:    CIR to begin insurance auth today after therapies re-see.  TOC following.   Expected Discharge Plan: IP Rehab Facility    Expected Discharge Plan and Services                                               Social Determinants of Health (SDOH) Interventions SDOH Screenings   Tobacco Use: Medium Risk (07/31/2022)    Readmission Risk Interventions     No data to display

## 2022-07-31 NOTE — Progress Notes (Signed)
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  PMH of HTN, sleep apnea.    PT Comments    The pt was agreeable to session with focus on LE strengthening for sit-stand transfers as well as dynamic seated balance challenge. The pt was able to complete ~25 sit-stand transfers through the session, but is reliant on LLE and LUE on stedy to complete transfer due to lack of activation of R quad. Attempted to facilitate increased activation through wt bearing and tactile cues. Pt also challenged by seated balance challenge involving reaching, cross-body punching activity, and supine core activation. The pt continues to demo good progress and is highly motivated.    Recommendations for follow up therapy are one component of a multi-disciplinary discharge planning process, led by the attending physician.  Recommendations may be updated based on patient status, additional functional criteria and insurance authorization.  Follow Up Recommendations       Assistance Recommended at Discharge Frequent or constant Supervision/Assistance  Patient can return home with the following Two people to help with walking and/or transfers;Two people to help with bathing/dressing/bathroom;Assistance with feeding;Help with stairs or ramp for entrance;Direct supervision/assist for medications management;Direct supervision/assist for financial management;Assist for transportation;Assistance with cooking/housework   Equipment Recommendations  Other (comment) (defer to post acute)    Recommendations for Other Services       Precautions / Restrictions  Precautions Precautions: Fall Precaution Comments: right sided hemiparesis, receptive/expressive deficits Restrictions Weight Bearing Restrictions: No     Mobility  Bed Mobility Overal bed mobility: Needs Assistance Bed Mobility: Sidelying to Sit, Sit to Sidelying   Sidelying to sit: Max assist     Sit to sidelying: Mod assist General bed mobility comments: verbal and tactile cues, good initiation with LUE and LLE, but assist to move RLE out of bed and to elevate trunk. once seated, pt with good balance    Transfers Overall transfer level: Needs assistance Equipment used: Ambulation equipment used Transfers: Sit to/from Stand Sit to Stand: Min assist, From elevated surface, Min guard           General transfer comment: min-minG using stedy to rise. pt dependent on LLE and LUE to pull to standing. completed x20 in session Transfer via Lift Equipment: Stedy  Ambulation/Gait             Pre-gait activities: standing wt shift in stedy with attempt to facilitate R knee extension/quad activation General Gait Details: unable   Modified Rankin (Stroke Patients Only) Modified Rankin (Stroke Patients Only) Pre-Morbid Rankin Score: No symptoms Modified Rankin: Severe disability     Balance Overall balance assessment: Needs assistance Sitting-balance support: Single extremity supported, Feet supported Sitting balance-Leahy Scale: Fair Sitting balance - Comments: able to complete seated reaching and cross-body punching exercise without UE support and without LOB   Standing balance support: Bilateral upper extremity supported, During functional activity, Reliant on assistive device for balance Standing balance-Leahy Scale: Poor Standing balance comment: reliant on stedy for support                            Cognition Arousal/Alertness: Awake/alert Behavior During Therapy: [facility] for tasks  assessed/performed Overall Cognitive Status: Difficult to assess Area  of Impairment: Attention, Following commands                   Current Attention Level: Selective   Following Commands: Follows one step commands consistently       General Comments: pt does report "yes" to most questions but was able to follow commands with increased time and did answer a few questions with answers other than yes/no that were appropriate.        Exercises Other Exercises Other Exercises: sit-stand from EOB 3 x 5 with use of stedy Other Exercises: standing wt sift with facilitation of R knee extension Other Exercises: mini-squat from sted 2 x 5 Other Exercises: seated cross-body punches with LUE to challenge dynamic sitting balance. no LOB but at times pt needing increased time to recover and return to midline Other Exercises: supine diagonal crunches to increase core activation for rolling 2 x 10 each direction    General Comments General comments (skin integrity, edema, etc.): VSS on RA      Pertinent Vitals/Pain Pain Assessment Pain Assessment: Faces Faces Pain Scale: Hurts a little bit Pain Location: R arm and leg Pain Descriptors / Indicators: Sore Pain Intervention(s): Limited activity within patient's tolerance, Monitored during session, Repositioned     PT Goals (current goals can now be found in the care plan section) Acute Rehab PT Goals Patient Stated Goal: to improve strength PT Goal Formulation: With patient Time For Goal Achievement: 08/10/22 Potential to Achieve Goals: Good Progress towards PT goals: Progressing toward goals    Frequency    Min 4X/week      PT Plan Current plan remains appropriate       AM-PAC PT "6 Clicks" Mobility   Outcome Measure  Help needed turning from your back to your side while in a flat bed without using bedrails?: A Lot Help needed moving from lying on your back to sitting on the side of a flat bed without using bedrails?: A Lot Help needed moving to and from a bed to a chair (including a  wheelchair)?: Total Help needed standing up from a chair using your arms (e.g., wheelchair or bedside chair)?: A Lot Help needed to walk in hospital room?: Total Help needed climbing 3-5 steps with a railing? : Total 6 Click Score: 9    End of Session Equipment Utilized During Treatment: Gait belt Activity Tolerance: Patient tolerated treatment well Patient left: in bed;with call bell/phone within reach;with bed alarm set Nurse Communication: Mobility status;Need for lift equipment PT Visit Diagnosis: Other symptoms and signs involving the nervous system (R29.898);Other abnormalities of gait and mobility (R26.89);Hemiplegia and hemiparesis;Unsteadiness on feet (R26.81);Muscle weakness (generalized) (M62.81);Difficulty in walking, not elsewhere classified (R26.2) Hemiplegia - Right/Left: Right Hemiplegia - dominant/non-dominant: Dominant Hemiplegia - caused by: Cerebral infarction     Time: 1610-9604 PT Time Calculation (min) (ACUTE ONLY): 29 min  Charges:  $Therapeutic Exercise: 23-37 mins                     Vickki Muff, PT, DPT   Acute Rehabilitation Department Office [phone] Secure Chat Communication Preferred   Ronnie Derby 07/31/2022, 1:27 PM

## 2022-07-31 NOTE — Telephone Encounter (Signed)
Patient is currently still in the hospital

## 2022-08-01 ENCOUNTER — Inpatient Hospital Stay (HOSPITAL_COMMUNITY)
Admission: RE | Admit: 2022-08-01 | Discharge: 2022-08-31 | DRG: 057 | Disposition: A | Discharge status: Home / Self Care | Payer: Managed Care, Other (non HMO) | Source: Intra-hospital | Attending: Physical Medicine & Rehabilitation | Admitting: Physical Medicine & Rehabilitation

## 2022-08-01 ENCOUNTER — Other Ambulatory Visit: Payer: Self-pay

## 2022-08-01 ENCOUNTER — Encounter (HOSPITAL_COMMUNITY): Payer: Self-pay | Admitting: Physical Medicine & Rehabilitation

## 2022-08-01 DIAGNOSIS — I69351 Hemiplegia and hemiparesis following cerebral infarction affecting right dominant side: Principal | ICD-10-CM

## 2022-08-01 DIAGNOSIS — R739 Hyperglycemia, unspecified: Secondary | ICD-10-CM | POA: Diagnosis not present

## 2022-08-01 DIAGNOSIS — N3941 Urge incontinence: Secondary | ICD-10-CM | POA: Diagnosis present

## 2022-08-01 DIAGNOSIS — Z8249 Family history of ischemic heart disease and other diseases of the circulatory system: Secondary | ICD-10-CM | POA: Diagnosis not present

## 2022-08-01 DIAGNOSIS — E8722 Chronic metabolic acidosis: Secondary | ICD-10-CM | POA: Diagnosis present

## 2022-08-01 DIAGNOSIS — K5901 Slow transit constipation: Secondary | ICD-10-CM | POA: Diagnosis not present

## 2022-08-01 DIAGNOSIS — Z87891 Personal history of nicotine dependence: Secondary | ICD-10-CM | POA: Diagnosis not present

## 2022-08-01 DIAGNOSIS — I69328 Other speech and language deficits following cerebral infarction: Secondary | ICD-10-CM | POA: Diagnosis not present

## 2022-08-01 DIAGNOSIS — I10 Essential (primary) hypertension: Secondary | ICD-10-CM

## 2022-08-01 DIAGNOSIS — Z91199 Patient's noncompliance with other medical treatment and regimen due to unspecified reason: Secondary | ICD-10-CM | POA: Diagnosis not present

## 2022-08-01 DIAGNOSIS — I69320 Aphasia following cerebral infarction: Secondary | ICD-10-CM

## 2022-08-01 DIAGNOSIS — I6329 Cerebral infarction due to unspecified occlusion or stenosis of other precerebral arteries: Secondary | ICD-10-CM | POA: Diagnosis not present

## 2022-08-01 DIAGNOSIS — Z7902 Long term (current) use of antithrombotics/antiplatelets: Secondary | ICD-10-CM

## 2022-08-01 DIAGNOSIS — F4323 Adjustment disorder with mixed anxiety and depressed mood: Secondary | ICD-10-CM

## 2022-08-01 DIAGNOSIS — E785 Hyperlipidemia, unspecified: Secondary | ICD-10-CM | POA: Diagnosis present

## 2022-08-01 DIAGNOSIS — N1411 Contrast-induced nephropathy: Secondary | ICD-10-CM | POA: Diagnosis present

## 2022-08-01 DIAGNOSIS — N179 Acute kidney failure, unspecified: Secondary | ICD-10-CM | POA: Diagnosis present

## 2022-08-01 DIAGNOSIS — I69322 Dysarthria following cerebral infarction: Secondary | ICD-10-CM | POA: Diagnosis not present

## 2022-08-01 DIAGNOSIS — R159 Full incontinence of feces: Secondary | ICD-10-CM | POA: Diagnosis not present

## 2022-08-01 DIAGNOSIS — N184 Chronic kidney disease, stage 4 (severe): Secondary | ICD-10-CM | POA: Diagnosis not present

## 2022-08-01 DIAGNOSIS — I63522 Cerebral infarction due to unspecified occlusion or stenosis of left anterior cerebral artery: Principal | ICD-10-CM

## 2022-08-01 DIAGNOSIS — G4733 Obstructive sleep apnea (adult) (pediatric): Secondary | ICD-10-CM | POA: Diagnosis present

## 2022-08-01 DIAGNOSIS — Z95828 Presence of other vascular implants and grafts: Secondary | ICD-10-CM

## 2022-08-01 DIAGNOSIS — Z8673 Personal history of transient ischemic attack (TIA), and cerebral infarction without residual deficits: Secondary | ICD-10-CM | POA: Diagnosis not present

## 2022-08-01 DIAGNOSIS — I129 Hypertensive chronic kidney disease with stage 1 through stage 4 chronic kidney disease, or unspecified chronic kidney disease: Secondary | ICD-10-CM | POA: Diagnosis present

## 2022-08-01 DIAGNOSIS — Z79899 Other long term (current) drug therapy: Secondary | ICD-10-CM | POA: Diagnosis not present

## 2022-08-01 DIAGNOSIS — K5900 Constipation, unspecified: Secondary | ICD-10-CM | POA: Diagnosis present

## 2022-08-01 DIAGNOSIS — E875 Hyperkalemia: Secondary | ICD-10-CM

## 2022-08-01 DIAGNOSIS — T508X5D Adverse effect of diagnostic agents, subsequent encounter: Secondary | ICD-10-CM

## 2022-08-01 LAB — GLUCOSE, CAPILLARY
Glucose-Capillary: 98 mg/dL (ref 70–99)
Glucose-Capillary: 97 mg/dL (ref 70–99)
Glucose-Capillary: 138 mg/dL — ABNORMAL HIGH (ref 70–99)
Glucose-Capillary: 110 mg/dL — ABNORMAL HIGH (ref 70–99)
Glucose-Capillary: 107 mg/dL — ABNORMAL HIGH (ref 70–99)
Glucose-Capillary: 118 mg/dL — ABNORMAL HIGH (ref 70–99)

## 2022-08-01 LAB — CARDIOLIPIN ANTIBODIES, IGG, IGM, IGA
Anticardiolipin IgA: <9 APL U/mL (ref 0–11)
Anticardiolipin IgG: <9 GPL U/mL (ref 0–14)
Anticardiolipin IgM: <9 MPL U/mL (ref 0–12)

## 2022-08-01 LAB — CBC
HCT: 44.5 % (ref 39.0–52.0)
Hemoglobin: 14.7 g/dL (ref 13.0–17.0)
MCH: 31 pg (ref 26.0–34.0)
MCHC: 33 g/dL (ref 30.0–36.0)
MCV: 93.9 fL (ref 80.0–100.0)
Platelets: 229 10*3/uL (ref 150–400)
RBC: 4.74 MIL/uL (ref 4.22–5.81)
RDW: 13 % (ref 11.5–15.5)
WBC: 8.2 10*3/uL (ref 4.0–10.5)
nRBC: 0 % (ref 0.0–0.2)

## 2022-08-01 LAB — BASIC METABOLIC PANEL
Anion gap: 11 (ref 5–15)
Anion gap: 12 (ref 5–15)
BUN: 68 mg/dL — ABNORMAL HIGH (ref 6–20)
BUN: 69 mg/dL — ABNORMAL HIGH (ref 6–20)
CO2: 18 mmol/L — ABNORMAL LOW (ref 22–32)
CO2: 21 mmol/L — ABNORMAL LOW (ref 22–32)
Calcium: 9.1 mg/dL (ref 8.9–10.3)
Calcium: 9 mg/dL (ref 8.9–10.3)
Chloride: 103 mmol/L (ref 98–111)
Chloride: 104 mmol/L (ref 98–111)
Creatinine, Ser: 3.05 mg/dL — ABNORMAL HIGH (ref 0.61–1.24)
Creatinine, Ser: 3.13 mg/dL — ABNORMAL HIGH (ref 0.61–1.24)
GFR, Estimated: 25 mL/min — ABNORMAL LOW (ref >60)
GFR, Estimated: 24 mL/min — ABNORMAL LOW (ref >60)
Glucose, Bld: 95 mg/dL (ref 70–99)
Glucose, Bld: 100 mg/dL — ABNORMAL HIGH (ref 70–99)
Potassium: 5.8 mmol/L — ABNORMAL HIGH (ref 3.5–5.1)
Potassium: 4.7 mmol/L (ref 3.5–5.1)
Sodium: 132 mmol/L — ABNORMAL LOW (ref 135–145)
Sodium: 137 mmol/L (ref 135–145)

## 2022-08-01 LAB — TRIGLYCERIDES: Triglycerides: 193 mg/dL — ABNORMAL HIGH (ref <150)

## 2022-08-01 MED ORDER — SODIUM ZIRCONIUM CYCLOSILICATE 10 G PO PACK
10.0000 g | PACK | Freq: Once | ORAL | Status: AC
  Administered 2022-08-01: 10 g via ORAL
  Filled 2022-08-01: qty 1

## 2022-08-01 MED ORDER — ACETAMINOPHEN 325 MG PO TABS
650.0000 mg | ORAL_TABLET | ORAL | Status: DC | PRN
  Administered 2022-08-02 – 2022-08-30 (×5): 650 mg via ORAL
  Filled 2022-08-01 (×5): qty 2

## 2022-08-01 MED ORDER — ASPIRIN 81 MG PO CHEW
81.0000 mg | CHEWABLE_TABLET | Freq: Every day | ORAL | Status: DC
  Administered 2022-08-02 – 2022-08-31 (×30): 81 mg via ORAL
  Filled 2022-08-01 (×30): qty 1

## 2022-08-01 MED ORDER — TICAGRELOR 90 MG PO TABS
90.0000 mg | ORAL_TABLET | Freq: Two times a day (BID) | ORAL | Status: DC
  Administered 2022-08-01 – 2022-08-31 (×60): 90 mg via ORAL
  Filled 2022-08-01 (×60): qty 1

## 2022-08-01 MED ORDER — NEPRO/CARBSTEADY PO LIQD
237.0000 mL | Freq: Two times a day (BID) | ORAL | Status: DC
  Administered 2022-08-02 – 2022-08-15 (×26): 237 mL via ORAL

## 2022-08-01 MED ORDER — ROSUVASTATIN CALCIUM 20 MG PO TABS
20.0000 mg | ORAL_TABLET | Freq: Every day | ORAL | Status: DC
  Administered 2022-08-02 – 2022-08-04 (×3): 20 mg
  Filled 2022-08-01 (×3): qty 1

## 2022-08-01 MED ORDER — HEPARIN SODIUM (PORCINE) 5000 UNIT/ML IJ SOLN: 5000.0000 [IU] | Freq: Three times a day (TID) | INTRAMUSCULAR | Status: DC

## 2022-08-01 MED ORDER — HEPARIN SODIUM (PORCINE) 5000 UNIT/ML IJ SOLN
5000.0000 [IU] | Freq: Three times a day (TID) | INTRAMUSCULAR | Status: DC
  Administered 2022-08-01 – 2022-08-16 (×44): 5000 [IU] via SUBCUTANEOUS
  Filled 2022-08-01 (×44): qty 1

## 2022-08-01 MED ORDER — ASPIRIN 81 MG PO CHEW: 81.0000 mg | CHEWABLE_TABLET | Freq: Every day | ORAL | Status: DC

## 2022-08-01 MED ORDER — ACETAMINOPHEN 160 MG/5ML PO SOLN: 650.0000 mg | ORAL | Status: DC | PRN

## 2022-08-01 MED ORDER — POLYETHYLENE GLYCOL 3350 17 G PO PACK
17.0000 g | PACK | Freq: Every day | ORAL | Status: DC
  Administered 2022-08-02 – 2022-08-21 (×17): 17 g via ORAL
  Filled 2022-08-01 (×18): qty 1

## 2022-08-01 MED ORDER — DOCUSATE SODIUM 100 MG PO CAPS
100.0000 mg | ORAL_CAPSULE | Freq: Two times a day (BID) | ORAL | Status: DC
  Administered 2022-08-01 – 2022-08-14 (×24): 100 mg via ORAL
  Filled 2022-08-01 (×24): qty 1

## 2022-08-01 MED ORDER — TICAGRELOR 90 MG PO TABS: 90.0000 mg | ORAL_TABLET | Freq: Two times a day (BID) | ORAL | Status: DC

## 2022-08-01 MED ORDER — ENSURE ENLIVE PO LIQD: 237.0000 mL | Freq: Two times a day (BID) | ORAL | Status: DC

## 2022-08-01 MED ORDER — HYDRALAZINE HCL 25 MG PO TABS
25.0000 mg | ORAL_TABLET | Freq: Three times a day (TID) | ORAL | Status: DC
  Administered 2022-08-01 – 2022-08-02 (×2): 25 mg via ORAL
  Filled 2022-08-01 (×2): qty 1

## 2022-08-01 MED ORDER — SODIUM BICARBONATE 650 MG PO TABS
650.0000 mg | ORAL_TABLET | Freq: Three times a day (TID) | ORAL | Status: DC
  Administered 2022-08-01 – 2022-08-03 (×7): 650 mg via ORAL
  Filled 2022-08-01 (×7): qty 1

## 2022-08-01 MED ORDER — INSULIN ASPART 100 UNIT/ML IJ SOLN
0.0000 [IU] | Freq: Three times a day (TID) | INTRAMUSCULAR | Status: DC
  Administered 2022-08-11: 5 [IU] via SUBCUTANEOUS
  Administered 2022-08-12 – 2022-08-13 (×2): 2 [IU] via SUBCUTANEOUS
  Administered 2022-08-16: 3 [IU] via SUBCUTANEOUS

## 2022-08-01 MED ORDER — LEVETIRACETAM ER 500 MG PO TB24
500.0000 mg | ORAL_TABLET | Freq: Every day | ORAL | Status: DC
  Administered 2022-08-01 – 2022-08-30 (×30): 500 mg via ORAL
  Filled 2022-08-01 (×31): qty 1

## 2022-08-01 MED ORDER — ACETAMINOPHEN 650 MG RE SUPP: 650.0000 mg | RECTAL | Status: DC | PRN

## 2022-08-01 NOTE — Plan of Care (Signed)
  Problem: Coping: Goal: Will verbalize positive feelings about self Outcome: Progressing Goal: Will identify appropriate support needs Outcome: Progressing   

## 2022-08-01 NOTE — Progress Notes (Signed)
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  PMH of HTN, sleep apnea.    PT Comments    Pt continues with expressive aphasia, delayed response time, questionable receptive difficulties, R inattention, dense R hemiparesis, impaired balance, and requires maxA for OOB mobility. Focused on sit to stands and maintaining standing with emphasis on R lateral weight shift and not using L UE to aide with sit to stand. Noted some R flexor tone in LE however R UE more flaccid. Pt remains appropriate for aggressive inpatient rehab program >3 hours a day to achieve maximal functional recovery and for family education on how to assist patient. Acute PT to cont to follow.    Recommendations for follow up therapy are one component of a multi-disciplinary discharge planning process, led by the attending physician.  Recommendations may be updated based on patient status, additional functional criteria and insurance authorization.  Follow Up Recommendations       Assistance Recommended at Discharge Frequent or constant Supervision/Assistance  Patient can return home with the following Two people to help with walking and/or transfers;Two people to help with bathing/dressing/bathroom;Assistance with feeding;Help with stairs or ramp for entrance;Direct supervision/assist for medications management;Direct supervision/assist for financial management;Assist for transportation;Assistance with cooking/housework   Equipment Recommendations  Other (comment) (defer to post acute)    Recommendations for Other Services Rehab consult     Precautions /  Restrictions Precautions Precautions: Fall Precaution Comments: right sided hemiparesis, receptive/expressive deficits Restrictions Weight Bearing Restrictions: No     Mobility  Bed Mobility Overal bed mobility: Needs Assistance Bed Mobility: Sidelying to Sit, Sit to Sidelying Rolling: Mod assist Sidelying to sit: Max assist Supine to sit: Mod assist, HOB elevated Sit to supine: Mod assist, HOB elevated Sit to sidelying: Mod assist General bed mobility comments: verbal and tactile cues, good initiation with LUE and LLE, but assist to move RLE out of bed and to elevate trunk. once seated, pt with good balance    Transfers Overall transfer level: Needs assistance Equipment used: Ambulation equipment used Transfers: Sit to/from Stand Sit to Stand: Min assist, From elevated surface, Mod assist Stand pivot transfers: Total assist         General transfer comment: minAx2 for sit to stand up into stedy with bed elevated and use of L UE, however maxA at R hip and knee to maintain equal weight bearing vs L lateral bias and R knee in extension vs flexion, maxA to keep R hand on stedy, worked on sit to stands from seat of stedy without using L UE to pull up and maxA to keep weight shifted to R side to promote R LE and UE strengthening, 10 sit to stands without L UE use completed, tech to aide in minimize using L UE Transfer via Lift Equipment: Stedy  Ambulation/Gait               General Gait Details: unable   Optometrist    Modified Rankin (Stroke Patients Only) Modified Rankin (Stroke Patients Only) Pre-Morbid Rankin Score: No symptoms Modified Rankin: Severe disability     Balance Overall balance assessment:  Needs assistance Sitting-balance support: Single extremity supported, Feet supported Sitting balance-Leahy Scale: Fair Sitting balance - Comments: pt able to maintain midline posture this date   Standing balance support:  Bilateral upper extremity supported, During functional activity, Reliant on assistive device for balance Standing balance-Leahy Scale: Poor Standing balance comment: reliant on stedy for support, L lateral bias, worked on bringing hips to neutral and R knee extension, however pt with no active R knee extension                            Cognition Arousal/Alertness: Awake/alert Behavior During Therapy: Flat affect Overall Cognitive Status: Difficult to assess Area of Impairment: Attention, Following commands, Awareness, Problem solving, Safety/judgement                   Current Attention Level: Sustained   Following Commands: Follows one step commands consistently, Follows one step commands with increased time Safety/Judgement: Decreased awareness of deficits, Decreased awareness of safety (R inattention)   Problem Solving: Slow processing, Decreased initiation, Difficulty sequencing, Requires verbal cues, Requires tactile cues General Comments: pt with noted word finding difficulty        Exercises Other Exercises Other Exercises: PROM R LAQ, pt with no activation, noted flexor tone Other Exercises: worked on sit to stand from seat of stedy without use L UE to pull self up to promote R LE strengthening    General Comments General comments (skin integrity, edema, etc.): VSS on RA      Pertinent Vitals/Pain Pain Assessment Pain Assessment: Faces Faces Pain Scale: No hurt    Home Living                          Prior Function            PT Goals (current goals can now be found in the care plan section) Acute Rehab PT Goals Patient Stated Goal: to improve strength PT Goal Formulation: With patient Time For Goal Achievement: 08/10/22 Potential to Achieve Goals: Good Progress towards PT goals: Progressing toward goals    Frequency    Min 4X/week      PT Plan Current plan remains appropriate    Co-evaluation               AM-PAC PT "6 Clicks" Mobility   Outcome Measure  Help needed turning from your back to your side while in a flat bed without using bedrails?: A Lot Help needed moving from lying on your back to sitting on the side of a flat bed without using bedrails?: A Lot Help needed moving to and from a bed to a chair (including a wheelchair)?: Total Help needed standing up from a chair using your arms (e.g., wheelchair or bedside chair)?: A Lot Help needed to walk in hospital room?: Total Help needed climbing 3-5 steps with a railing? : Total 6 Click Score: 9    End of Session Equipment Utilized During Treatment: Gait belt Activity Tolerance: Patient tolerated treatment well Patient left: with call bell/phone within reach;in chair;with chair alarm set;with nursing/sitter in room Nurse Communication: Mobility status;Need for lift equipment PT Visit Diagnosis: Other symptoms and signs involving the nervous system (R29.898);Other abnormalities of gait and mobility (R26.89);Hemiplegia and hemiparesis;Unsteadiness on feet (R26.81);Muscle weakness (generalized) (M62.81);Difficulty in walking, not elsewhere classified (R26.2) Hemiplegia - Right/Left: Right Hemiplegia - dominant/non-dominant: Dominant Hemiplegia - caused by: Cerebral infarction     Time: 1610-9604  PT Time Calculation (min) (ACUTE ONLY): 31 min  Charges:  $Therapeutic Activity: 8-22 mins $Neuromuscular Re-education: 8-22 mins                     Lewis Shock, PT, DPT Acute Rehabilitation Services Secure chat preferred Office #: [phone]    Iona Hansen 08/01/2022, 1:53 PM

## 2022-08-01 NOTE — Progress Notes (Signed)
Inpatient Rehabilitation Admissions Coordinator   I have insurance approval and CIR bed to admit to CIR today. Dr Pearlean Brownie is aware, acute team and, TOC and his wife. I will make the arrangements to admit today.  Ottie Glazier, RN, MSN Rehab Admissions Coordinator [phone] 08/01/2022 3:56 PM

## 2022-08-01 NOTE — TOC Transition Note (Signed)
Transition of Care [facility]) - CM/SW Discharge Note   Patient Details  Name: Joseph Bright MRN: [id number] Date of Birth: Oct 31, 1975  Transition of Care [facility]) CM/SW Contact:  Kermit Balo, RN Phone Number: 08/01/2022, 3:56 PM   Clinical Narrative:    Pt is discharging to CIR today. CM signing off.    Final next level of care: IP Rehab Facility Barriers to Discharge: No Barriers Identified   Patient Goals and CMS Choice   Choice offered to / list presented to : Patient  Discharge Placement                         Discharge Plan and Services Additional resources added to the After Visit Summary for                                       Social Determinants of Health (SDOH) Interventions SDOH Screenings   Tobacco Use: Medium Risk (07/31/2022)     Readmission Risk Interventions     No data to display

## 2022-08-01 NOTE — H&P (Signed)
Physical Medicine and Rehabilitation Admission H&P    Chief Complaint  Patient presents with   Code Stroke  : HPI: Joseph Bright is a 47 year old right-handed male with history of hypertension, CKD stage IV followed at outpatient nephrology, as well as medical noncompliance, quit smoking 22 years ago.  Per chart review patient lives with spouse-year-old daughter.  1 level home multiple steps to entry.  Independent prior to admission.  Presented 07/25/2022 with acute onset of right-sided weakness as well as speech difficulty.  Cranial CT scan negative for acute changes.  There was chronic appearing right thalamic lacunar infarct.  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  Status post TNK with interventional radiology for stenting.  MRI follow-up showed confluent left ACA territory infarction.  Cytotoxic edema with no hemorrhagic transformation.  Multiple additional scattered mostly punctate ischemic foci in the left greater than right MCA, PCA and occasionally cerebellar artery territories.  No significant mass effect.  Echocardiogram with ejection fraction of 70 to 75% grade 1 diastolic dysfunction.  Patient with reported seizure-like activity.  EEG negative for seizure.  Patient was maintained on Keppra for seizure prophylaxis.  TEE unremarkable no PFO, no cardioembolic source.  Neurology follow-up maintained on aspirin as well as Brilinta 90 mg twice daily for CVA prophylaxis.  Underwent loop recorder placement 07/28/2022.  Subcutaneous heparin added for DVT prophylaxis.  Initially on tube feeds for nutritional support her diet has been advanced to regular.  Bouts of hyperkalemia patient did require Lokelma with latest potassium 5.8 and follow-up chemistries pending.  Therapy evaluations completed due to patient's decreased functional mobility and right-sided weakness was admitted for a comprehensive rehab program.  Review of Systems  Constitutional:  Negative  for chills and fever.  HENT:  Negative for hearing loss.   Eyes:  Negative for blurred vision and double vision.  Respiratory:  Negative for cough, shortness of breath and wheezing.   Cardiovascular:  Negative for chest pain, palpitations and leg swelling.  Gastrointestinal:  Positive for constipation. Negative for heartburn, nausea and vomiting.  Genitourinary:  Negative for dysuria, flank pain and hematuria.  Musculoskeletal:  Positive for myalgias.  Skin:  Negative for rash.  Neurological:  Positive for speech change and weakness.  All other systems reviewed and are negative.  Past Medical History:  Diagnosis Date   Hypertension    Stroke [facility])    Past Surgical History:  Procedure Laterality Date   IR PERCUTANEOUS ART THROMBECTOMY/INFUSION INTRACRANIAL INC DIAG ANGIO  07/25/2022   LOOP RECORDER INSERTION N/A 07/28/2022   Procedure: LOOP RECORDER INSERTION;  Surgeon: Regan Lemming, MD;  Location: [facility] INVASIVE CV LAB;  Service: Cardiovascular;  Laterality: N/A;   RADIOLOGY WITH ANESTHESIA N/A 07/25/2022   Procedure: RADIOLOGY WITH ANESTHESIA;  Surgeon: Radiologist, Medication, MD;  Location: [facility] OR;  Service: Radiology;  Laterality: N/A;   TEE WITHOUT CARDIOVERSION N/A 07/28/2022   Procedure: TRANSESOPHAGEAL ECHOCARDIOGRAM;  Surgeon: Parke Poisson, MD;  Location: [facility] INVASIVE CV LAB;  Service: Cardiovascular;  Laterality: N/A;   History reviewed. No pertinent family history. Social History:  reports that he quit smoking about 22 years ago. His smoking use included cigarettes. He started smoking about 27 years ago. He has never used smokeless tobacco. He reports current alcohol use. He reports that he does not use drugs. Allergies: No Known Allergies Medications Prior to Admission  Medication Sig Dispense Refill   amLODipine (NORVASC) 10 MG tablet Take 10 mg by mouth  daily.     hydrALAZINE (APRESOLINE) 50 MG tablet Take 50 mg by mouth 2 (two) times daily.     isosorbide  mononitrate (IMDUR) 60 MG 24 hr tablet Take 60 mg by mouth daily.     labetalol (NORMODYNE) 200 MG tablet Take 200 mg by mouth 2 (two) times daily.     simvastatin (ZOCOR) 20 MG tablet Take 20 mg by mouth daily.     traMADol (ULTRAM) 50 MG tablet TAKE 1 TO 2 TABLETS(50 TO 100 MG) BY MOUTH AT BEDTIME AS NEEDED (Patient not taking: Reported on 07/25/2022) 20 tablet 0      Home: Home Living Family/patient expects to be discharged to:: Private residence Living Arrangements: Spouse/significant other Available Help at Discharge: Available 24 hours/day (wife works from home) Type of Home: Apartment Home Access: Stairs to enter [employer] of Steps: 15; 2nd floor apartment Entrance Stairs-Rails: Left, Right Home Layout: One level Bathroom Shower/Tub: Associate Professor: Yes Home Equipment: None  Lives With: Spouse   Functional History: Prior Function Prior Level of Function : Independent/Modified Independent, Driving  Functional Status:  Mobility: Bed Mobility Overal bed mobility: Needs Assistance Bed Mobility: Sidelying to Sit, Sit to Sidelying Rolling: Mod assist Sidelying to sit: Max assist Supine to sit: Mod assist, HOB elevated Sit to supine: Mod assist, HOB elevated Sit to sidelying: Mod assist General bed mobility comments: verbal and tactile cues, good initiation with LUE and LLE, but assist to move RLE out of bed and to elevate trunk. once seated, pt with good balance Transfers Overall transfer level: Needs assistance Equipment used: Ambulation equipment used Transfers: Sit to/from Stand Sit to Stand: Min assist, From elevated surface, Mod assist Bed to/from chair/wheelchair/BSC transfer type:: Via Lift equipment Stand pivot transfers: Total assist Squat pivot transfers: Max assist, +2 physical assistance Transfer via Lift Equipment: Stedy General transfer comment: minAx2 for sit to stand up into stedy with bed  elevated and use of L UE, however maxA at R hip and knee to maintain equal weight bearing vs L lateral bias and R knee in extension vs flexion, maxA to keep R hand on stedy, worked on sit to stands from seat of stedy without using L UE to pull up and maxA to keep weight shifted to R side to promote R LE and UE strengthening, 10 sit to stands without L UE use completed, tech to aide in minimize using L UE Ambulation/Gait General Gait Details: unable Pre-gait activities: standing wt shift in stedy with attempt to facilitate R knee extension/quad activation    ADL: ADL Overall ADL's : Needs assistance/impaired Grooming: Wash/dry face, Oral care, Minimal assistance, Sitting Grooming Details (indicate cue type and reason): in recliner with assistance for toothpaste and instructions on one handed techniques Toilet Transfer: Maximal assistance, +2 for physical assistance, Tax adviser Details (indicate cue type and reason): simulated to the recliner Toileting- Clothing Manipulation and Hygiene: Sit to/from stand, Maximal assistance Toileting - Clothing Manipulation Details (indicate cue type and reason): simulated standing Functional mobility during ADLs: Maximal assistance, +2 for safety/equipment (sit to stand and for squat pivot transfer) General ADL Comments: focused on sitting balance on EOB  Cognition: Cognition Overall Cognitive Status: Difficult to assess Arousal/Alertness: Awake/alert Orientation Level: Oriented to person, Oriented to place, Disoriented to time, Disoriented to situation Attention: Sustained Sustained Attention: Appears intact Cognition Arousal/Alertness: Awake/alert Behavior During Therapy: Flat affect Overall Cognitive Status: Difficult to assess Area of Impairment: Attention, Following commands, Awareness, Problem solving, Safety/judgement  Current Attention Level: Sustained Following Commands: Follows one step commands consistently, Follows one step  commands with increased time Safety/Judgement: Decreased awareness of deficits, Decreased awareness of safety (R inattention) Problem Solving: Slow processing, Decreased initiation, Difficulty sequencing, Requires verbal cues, Requires tactile cues General Comments: pt with noted word finding difficulty Difficult to assess due to: Impaired communication  Physical Exam: Blood pressure (!) 145/111, pulse 94, temperature 98.4 F (36.9 C), temperature source Oral, resp. rate 20, height 5\' 9"  (1.753 m), weight 117.7 kg, SpO2 98 %. Physical Exam Neurological:     Comments: Patient is alert.  Makes eye contact with examiner.  Speaks in single words with some word finding difficulties.     Results for orders placed or performed during the hospital encounter of 07/25/22 (from the past 48 hour(s))  Glucose, capillary     Status: Abnormal   Collection Time: 07/30/22  7:46 PM  Result Value Ref Range   Glucose-Capillary 110 (H) 70 - 99 mg/dL    Comment: Glucose reference range applies only to samples taken after fasting for at least 8 hours.   Comment 1 Notify RN   Glucose, capillary     Status: None   Collection Time: 07/31/22  3:19 AM  Result Value Ref Range   Glucose-Capillary 86 70 - 99 mg/dL    Comment: Glucose reference range applies only to samples taken after fasting for at least 8 hours.  Basic metabolic panel     Status: Abnormal   Collection Time: 07/31/22  3:42 AM  Result Value Ref Range   Sodium 138 135 - 145 mmol/L   Potassium 5.4 (H) 3.5 - 5.1 mmol/L   Chloride 108 98 - 111 mmol/L   CO2 20 (L) 22 - 32 mmol/L   Glucose, Bld 104 (H) 70 - 99 mg/dL    Comment: Glucose reference range applies only to samples taken after fasting for at least 8 hours.   BUN 62 (H) 6 - 20 mg/dL   Creatinine, Ser 1.61 (H) 0.61 - 1.24 mg/dL   Calcium 9.2 8.9 - 09.6 mg/dL   GFR, Estimated 25 (L) >60 mL/min    Comment: (NOTE) Calculated using the CKD-EPI Creatinine Equation (2021)    Anion gap 10 5 -  15    Comment: Performed at [facility], [address] [place], [place] [address]  CBC     Status: None   Collection Time: 07/31/22  3:42 AM  Result Value Ref Range   WBC 9.4 4.0 - 10.5 K/uL   RBC 4.69 4.22 - 5.81 MIL/uL   Hemoglobin 14.6 13.0 - 17.0 g/dL   HCT 98.1 19.1 - 47.8 %   MCV 91.9 80.0 - 100.0 fL   MCH 31.1 26.0 - 34.0 pg   MCHC 33.9 30.0 - 36.0 g/dL   RDW 29.5 62.1 - 30.8 %   Platelets 232 150 - 400 K/uL   nRBC 0.0 0.0 - 0.2 %    Comment: Performed at [facility], [address], [place], [place] [address]  Glucose, capillary     Status: None   Collection Time: 07/31/22  8:01 AM  Result Value Ref Range   Glucose-Capillary 98 70 - 99 mg/dL    Comment: Glucose reference range applies only to samples taken after fasting for at least 8 hours.   Comment 1 Notify RN    Comment 2 Document in Chart   Glucose, capillary     Status: None   Collection Time: 07/31/22  11:31 AM  Result Value Ref Range   Glucose-Capillary 93 70 - 99 mg/dL    Comment: Glucose reference range applies only to samples taken after fasting for at least 8 hours.   Comment 1 Notify RN    Comment 2 Document in Chart   Glucose, capillary     Status: None   Collection Time: 07/31/22  3:52 PM  Result Value Ref Range   Glucose-Capillary 99 70 - 99 mg/dL    Comment: Glucose reference range applies only to samples taken after fasting for at least 8 hours.   Comment 1 Notify RN    Comment 2 Document in Chart   Glucose, capillary     Status: Abnormal   Collection Time: 07/31/22  8:14 PM  Result Value Ref Range   Glucose-Capillary 105 (H) 70 - 99 mg/dL    Comment: Glucose reference range applies only to samples taken after fasting for at least 8 hours.   Comment 1 Notify RN    Comment 2 Document in Chart   Glucose, capillary     Status: None   Collection Time: 07/31/22 11:36 PM  Result Value Ref Range   Glucose-Capillary 93 70 - 99 mg/dL    Comment: Glucose reference range applies only to  samples taken after fasting for at least 8 hours.   Comment 1 Notify RN    Comment 2 Document in Chart   Glucose, capillary     Status: None   Collection Time: 08/01/22  3:56 AM  Result Value Ref Range   Glucose-Capillary 98 70 - 99 mg/dL    Comment: Glucose reference range applies only to samples taken after fasting for at least 8 hours.   Comment 1 Notify RN    Comment 2 Document in Chart   Triglycerides     Status: Abnormal   Collection Time: 08/01/22  6:20 AM  Result Value Ref Range   Triglycerides 193 (H) <150 mg/dL    Comment: Performed at [facility], [address] [place], [place] [address]  CBC     Status: None   Collection Time: 08/01/22  6:20 AM  Result Value Ref Range   WBC 8.2 4.0 - 10.5 K/uL   RBC 4.74 4.22 - 5.81 MIL/uL   Hemoglobin 14.7 13.0 - 17.0 g/dL   HCT 19.1 47.8 - 29.5 %   MCV 93.9 80.0 - 100.0 fL   MCH 31.0 26.0 - 34.0 pg   MCHC 33.0 30.0 - 36.0 g/dL   RDW 62.1 30.8 - 65.7 %   Platelets 229 150 - 400 K/uL   nRBC 0.0 0.0 - 0.2 %    Comment: Performed at [facility], [address] [place], [place] [address]  Basic metabolic panel     Status: Abnormal   Collection Time: 08/01/22  6:20 AM  Result Value Ref Range   Sodium 132 (L) 135 - 145 mmol/L   Potassium 5.8 (H) 3.5 - 5.1 mmol/L   Chloride 103 98 - 111 mmol/L   CO2 18 (L) 22 - 32 mmol/L   Glucose, Bld 95 70 - 99 mg/dL    Comment: Glucose reference range applies only to samples taken after fasting for at least 8 hours.   BUN 68 (H) 6 - 20 mg/dL   Creatinine, Ser 2.95 (H) 0.61 - 1.24 mg/dL   Calcium 9.1 8.9 - 28.4 mg/dL   GFR, Estimated 25 (L) >60 mL/min    Comment: (NOTE) Calculated using the CKD-EPI Creatinine  Equation (2021)    Anion gap 11 5 - 15    Comment: Performed at [facility], [address], [place], [place] [address]  Glucose, capillary     Status: None   Collection Time: 08/01/22  7:31 AM  Result Value Ref Range   Glucose-Capillary 97 70 - 99 mg/dL     Comment: Glucose reference range applies only to samples taken after fasting for at least 8 hours.  Glucose, capillary     Status: Abnormal   Collection Time: 08/01/22 11:34 AM  Result Value Ref Range   Glucose-Capillary 138 (H) 70 - 99 mg/dL    Comment: Glucose reference range applies only to samples taken after fasting for at least 8 hours.   Comment 1 Notify RN    Comment 2 Document in Chart    No results found.    Blood pressure (!) 145/111, pulse 94, temperature 98.4 F (36.9 C), temperature source Oral, resp. rate 20, height 5\' 9"  (1.753 m), weight 117.7 kg, SpO2 98 %.  Medical Problem List and Plan: 1. Functional deficits secondary to left ACA infarct due to left A2 occlusion status post TNK with stenting  -patient may *** shower  -ELOS/Goals: *** 2.  Antithrombotics: -DVT/anticoagulation:  Pharmaceutical: Heparin  -antiplatelet therapy: Aspirin 81 mg daily and Brilinta 90 mg twice daily 3. Pain Management: Tylenol as needed 4. Mood/Behavior/Sleep: Provide emotional support  -antipsychotic agents: N/A 5. Neuropsych/cognition: This patient is not capable of making decisions on his own behalf. 6. Skin/Wound Care: Routine skin checks 7. Fluids/Electrolytes/Nutrition: Routine in and outs with follow-up chemistries 8.  Seizure prophylaxis.  EEG negative.  Continue Keppra 500 mg daily 9.  CKD stage IV.  Follow-up chemistries.  Follow-up outpatient nephrology services 10.  Hyperlipidemia.  Crestor 11.  Permissive hypertension.  Patient on amlodipine 10 mg daily, hydralazine 50 mg twice daily, labetalol 200 mg twice daily prior to admission.  Resume as needed 12.  Medical noncompliance.  Provide counseling  ***  Charlton Amor, PA-C 08/01/2022

## 2022-08-01 NOTE — Progress Notes (Addendum)
STROKE TEAM PROGRESS NOTE   INTERVAL HISTORY Patient is seen in his room with no family at bedside.  His vital signs remained stable, and neurological exam continues to reveal dense him and right hemiplegia with some tone noted in the right leg, right arm remaining flaccid.  Potassium was noted to be 5.8 this morning despite Lokelma administration yesterday.  EKG shows no widened QRS or peaked T waves, will readminister Lokelma and follow-up this afternoon. Vitals:   07/31/22 2336 08/01/22 0356 08/01/22 0732 08/01/22 1133  BP: (!) 155/99 (!) 142/115 (!) 135/109 (!) 145/111  Pulse: 82 65 76 94  Resp: (!) 22 20 16 20   Temp: 98 F (36.7 C) 97.7 F (36.5 C) 98.1 F (36.7 C) 98.4 F (36.9 C)  TempSrc: Oral Oral  Oral  SpO2: 100% 100% 99% 98%  Weight:      Height:       CBC:  Recent Labs  Lab 07/26/22 0500 07/27/22 0624 07/31/22 0342 08/01/22 0620  WBC 14.8*   < > 9.4 8.2  NEUTROABS 12.8*  --   --   --   HGB 13.1   < > 14.6 14.7  HCT 37.1*   < > 43.1 44.5  MCV 92.1   < > 91.9 93.9  PLT 236   < > 232 229   < > = values in this interval not displayed.    Basic Metabolic Panel:  Recent Labs  Lab 07/26/22 0500 07/26/22 0545 07/27/22 0624 07/27/22 1936 07/28/22 0704 07/31/22 0342 08/01/22 0620  NA  --    < > 140  --    < > 138 132*  K  --    < > 4.3  --    < > 5.4* 5.8*  CL  --    < > 109  --    < > 108 103  CO2  --    < > 22  --    < > 20* 18*  GLUCOSE  --    < > 133*  --    < > 104* 95  BUN  --    < > 41*  --    < > 62* 68*  CREATININE  --    < > 3.23*  --    < > 3.01* 3.05*  CALCIUM  --    < > 9.0  --    < > 9.2 9.1  MG 1.7  --  2.2  --   --   --   --   PHOS  --    < > 4.0 3.9  --   --   --    < > = values in this interval not displayed.    Lipid Panel:  Recent Labs  Lab 07/27/22 0624 07/29/22 0548 08/01/22 0620  CHOL 165  --   --   TRIG 313*   < > 193*  HDL 28*  --   --   CHOLHDL 5.9  --   --   VLDL 63*  --   --   LDLCALC 74  --   --    < > = values in  this interval not displayed.    HgbA1c:  Recent Labs  Lab 07/25/22 1153  HGBA1C 5.6    Urine Drug Screen:  Recent Labs  Lab 07/30/22 1231  LABOPIA NONE DETECTED  COCAINSCRNUR NONE DETECTED  LABBENZ NONE DETECTED  AMPHETMU NONE DETECTED  THCU NONE DETECTED  LABBARB NONE DETECTED  Alcohol Level  No results for input(s): "ETH" in the last 168 hours.   IMAGING past 24 hours No results found.  PHYSICAL EXAM General: Drowsy, well-nourished, well-developed patient in no acute distress Respiratory: Regular, unlabored respirations  NEURO:  Mental Status: AA&Ox3  Speech/Language: Speech is in single words and short phrases with some word finding difficulties.  He is able to repeat phrases and name objects.  H  Cranial Nerves:  II: PERRL.  III, IV, VI: EOMI. Eyelids elevate symmetrically.  V: Sensation is intact to light touch and symmetrical to face.  VII: Slight right facial droop VIII: hearing intact to voice. IX, X: Phonation is normal.  [id number] shrug 5/5. XII: tongue is midline without fasciculations. Motor: 5/5 strength to left upper and lower extremities, no movement of right upper and lower extremities Tone: is normal and bulk is normal on left side, right arm is flaccid Sensation- Intact to light touch bilaterally.      ASSESSMENT/PLAN [name] is a 47 y.o. male with history of hypertension and back pain presenting with sudden onset aphasia and right-sided weakness.  He was brought to the ED and given TNK to treat stroke, and CTA showed left ACA occlusion.  Patient was taken to IR and TICI 3 flow was achieved in left ACA.  Unfortunately, this reoccluded and rescue stenting was performed.  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 Small vessel disease. ASPECTS 10.    CTA head & neck acute occlusion of left ACA A2 segment IR - left A2 occlusion with TICI3 and reocclusion s/p stenting MRI confluent left ACA territory infarct with multiple scattered punctate ischemic foci in right MCA PCA and cerebellar artery territories MRA attenuated left ACA proximal segment, likely due to vascular stent artifact 2D Echo EF 70 to 75% TEE unremarkable, no PFO, no cardioembolic source S/p loop recorder 07/28/22 LDL 74, TG 313 HgbA1c 5.6 VTE prophylaxis -SQH No antithrombotic prior to admission, now on aspirin 81 mg daily and Brilinta (ticagrelor) 90 mg bid due to stent placement Therapy recommendations: CIR Disposition: pending CIR   Hypertension Home meds: Amlodipine 10 mg daily, hydralazine 50 mg twice daily, labetalol 200 mg twice daily Stable now Keep systolic blood pressure less than 180 Long-term BP goal normotensive  Hyperlipidemia Home meds: Simvastatin 20 mg daily, not compliant LDL 74, goal < 70 Now on Crestor 20 Continue statin at discharge  Seizure like activity Patient had focal seizure overnight consisting of right upper extremity shaking followed by worsening aphasia for about 2 minutes EEG shows no seizures or epileptiform discharges Keppra reduced to 500 mg XR daily given renal function  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-> 2.79-> 2.96-> 3.01-> 3.05 Baseline CKD Continue to follow-up with outpatient nephrology, patient already is established with [facility]  Dysphagia Was on tube feeding per core track Also on full liquid diet per speech therapist 5/2 Core track removed during TEE procedure Passed bedside swallow screen 5/3 Put on dys1 and thin liquid Speech following  Other Stroke Risk Factors Former cigarette smoker Obesity, Body mass index is 38.32 kg/m., BMI >/= 30 associated with increased stroke risk, recommend weight loss, diet and exercise as appropriate  Obstructive  sleep apnea, not on CPAP at home  Hyperkalemia Potassium 5.2 on 5/4 in the setting of  elevated creatinine Potassium 5.4 on 5/6, 5 g of Lokelma given Potassium 5.8 on 5/7, no EKG changes noted, 10 g of Lokelma given and will follow-up with potassium this afternoon BMP in the morning  Other Active Problems Leukocytosis WBC 14.8-> 8.7-> 8.0-> 7.9 -> stable  Hospital day # 7  Patient is seen by NP and then by MD, MD to edit note is needed Cortney E Ernestina Columbia , MSN, AGACNP-BC [facility] See Amion for schedule and pager information 08/01/2022 11:40 AM I have personally obtained history,examined this patient, reviewed notes, independently viewed imaging studies, participated in medical decision making and plan of care.ROS completed by me personally and pertinent positives fully documented  I have made any additions or clarifications directly to the above note. Agree with note above.  Neurological exam is stable with persistent dense right hemiplegia.  Potassium continues to be elevated despite 1 dose of Lokelma though renal function is stable.  Will repeat lokelma dose today.  Medically stable to be transferred to inpatient rehab when bed available.  Discussed with rehab team.  Greater than 50% time during this 35-minute visit was spent in counseling and coordination of care about his stroke and hemiplegia and discussion about elevated potassium and renal failure and answering questions.  Delia Heady, MD Medical Director [facility] Pager: [phone] 08/01/2022 12:38 PM  To contact Stroke Continuity provider, please refer to [email or web address]. After hours, contact General Neurology

## 2022-08-01 NOTE — Progress Notes (Signed)
Met with patient at bedside. Oriented to rehab and team conference. Loop recorder placed and on in room. Patient reports history of syncope episodes. Reports that was taking new B/P medicine when he had stroke. Unable to recall the name of medication. Encouraged to keep log of blood pressure at home and gave record sheet.  Keep until follow up with PCP to review. Confirmed that drew blood ago. Called PA due to potassium being 5.8 with no follow up. Encouraged not to drive until cleared with MD.  Discussed importance of ASA and Brilinta. Discussed trig 198 and diet modifications. Didn't go into any other detail with diet since diet may change due to BUN/Cr and K elevated. All needs met, call bell in reach.

## 2022-08-01 NOTE — Progress Notes (Addendum)
Patient with elevated BP--will resume hydralazine at 25 mg every 8 hours. Hold for SBP <120. Will change ensure to nephro due to CKD/elevated K+.

## 2022-08-01 NOTE — Progress Notes (Signed)
Joseph Oyster, MD  Physician Physical Medicine and Rehabilitation   PMR Pre-admission     Signed   Date of Service: 07/29/2022  1:19 PM  Related encounter: ED to Hosp-Admission (Discharged) from 07/25/2022 in [facility]   Signed      Show:Clear all [x] Written[x] Templated[x] Copied  Added by: [x] Standley Brooking, RN[x] Joseph Oyster, MD  [] Hover for details PMR Admission Coordinator Pre-Admission Assessment   Patient: Joseph Bright is an 47 y.o., male MRN: [id number] DOB: Jun 20, 1975 Height: 5\' 9"  (175.3 cm) Weight: 117.7 kg   Insurance Information HMO:     PPO:      PCP:      IPA:      80/20:      OTHER:  PRIMARY: [employer]      Policy#: [id number]      Subscriber: Joseph Bright      Phone#: [phone]     Fax#: [phone] Pre-Cert#: [id number] approved  for 7 days f/u with [facility] phone [phone] ext [phone] fax [phone]      Employer: [employer] Benefits:  Phone #: [phone]     Name: 5/6 Eff. Date: 03/27/22     Deduct: $3000      Out of Pocket Max: $7350      Life Max: none CIR: 70%      SNF: 70% Outpatient: 70%     Co-Pay: per medical neccesity Home Health: 70%      Co-Pay:  DME: 70%     Co-Pay: 30% Providers: in network  SECONDARY: none   Financial Counselor:       Phone#:    The Data processing manager" for patients in Inpatient Rehabilitation Facilities with attached "Privacy Act Statement-Health Care Records" was provided and verbally reviewed with: N/A   Emergency Contact Information Contact Information       Name Relation Home Work Mobile    Joseph Bright,Joseph Bright Spouse [phone]   [phone]         Current Medical History  Patient Admitting Diagnosis: CVA   History of Present Illness: 47 year old male with history of HTN, CKD IV, back pain with presumable noncompliance with meds because of no med fill history for last 6 months. Presented via EMS on 07/25/22 when Joseph found him unable to talk and  move right side.   Cranial CT scan negative for acute changes.  There was chronic appearing right thalamic lacunar infarct.  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  Status post TNK with interventional radiology for stenting.  MRI follow-up showed confluent left ACA territory infarction.  Cytotoxic edema with no hemorrhagic transformation.  Multiple additional scattered mostly punctate ischemic foci in the left greater than right MCA, PCA and occasionally cerebellar artery territories.  No significant mass effect.  Echocardiogram with ejection fraction of 70 to 75% grade 1 diastolic dysfunction.  Patient with reported seizure-like activity.  EEG negative for seizure.  Patient was maintained on Keppra for seizure prophylaxis.  TEE unremarkable no PFO, no cardioembolic source.  Neurology follow-up maintained on aspirin as well as Brilinta 90 mg twice daily for CVA prophylaxis.  Underwent loop recorder placement 07/28/2022.  Subcutaneous heparin added for DVT prophylaxis.  Initially on tube feeds for nutritional support her diet has been advanced to regular.  Bouts of hyperkalemia patient did require Lokelma with latest potassium 5.8 and follow-up chemistries pending.      Complete NIHSS TOTAL: 15   Patient's medical  record from [facility] has been reviewed by the rehabilitation admission coordinator and physician.   Past Medical History      Past Medical History:  Diagnosis Date   Hypertension     Stroke [facility])      Has the patient had major surgery during 100 days prior to admission? Yes   Family History   family history is not on file.   Current Medications   Current Facility-Administered Medications:    acetaminophen (TYLENOL) tablet 650 mg, 650 mg, Oral, Q4H PRN, 650 mg at 07/30/22 1620 **OR** acetaminophen (TYLENOL) 160 MG/5ML solution 650 mg, 650 mg, Per Tube, Q4H PRN **OR** acetaminophen (TYLENOL) suppository 650 mg, 650 mg, Rectal, Q4H PRN,  Milon Dikes, MD   aspirin chewable tablet 81 mg, 81 mg, Oral, Daily, 81 mg at 08/01/22 0842 **OR** aspirin chewable tablet 81 mg, 81 mg, Per Tube, Daily, Deveshwar, Sanjeev, MD   Chlorhexidine Gluconate Cloth 2 % PADS 6 each, 6 each, Topical, Daily, Chand, Sudham, MD, 6 each at 07/31/22 0957   docusate sodium (COLACE) capsule 100 mg, 100 mg, Oral, BID, Francena Hanly, RPH, 100 mg at 08/01/22 0843   feeding supplement (ENSURE ENLIVE / ENSURE PLUS) liquid 237 mL, 237 mL, Oral, BID BM, Marvel Plan, MD, 237 mL at 08/01/22 1332   heparin injection 5,000 Units, 5,000 Units, Subcutaneous, Q8H, Marvel Plan, MD, 5,000 Units at 08/01/22 1327   hydrALAZINE (APRESOLINE) injection 10-20 mg, 10-20 mg, Intravenous, Q2H PRN, Ogan, Okoronkwo U, MD, 20 mg at 07/26/22 0443   levETIRAcetam (KEPPRA XR) 24 hr tablet 500 mg, 500 mg, Oral, Q2200, de Saintclair Halsted, Cortney E, NP, 500 mg at 07/31/22 2111   Oral care mouth rinse, 15 mL, Mouth Rinse, PRN, Bhagat, Srishti L, MD, 15 mL at 07/26/22 2128   polyethylene glycol (MIRALAX / GLYCOLAX) packet 17 g, 17 g, Oral, Daily, Francena Hanly, RPH, 17 g at 07/31/22 9604   rosuvastatin (CRESTOR) tablet 20 mg, 20 mg, Per Tube, Daily, Marvel Plan, MD, 20 mg at 08/01/22 5409   senna-docusate (Senokot-S) tablet 1 tablet, 1 tablet, Oral, QHS PRN, Milon Dikes, MD   sodium bicarbonate tablet 650 mg, 650 mg, Oral, TID, Cheri Fowler, MD, 650 mg at 08/01/22 1558   sodium chloride flush (NS) 0.9 % injection 3 mL, 3 mL, Intravenous, Once, Benjiman Core, MD   ticagrelor (BRILINTA) tablet 90 mg, 90 mg, Oral, BID, 90 mg at 08/01/22 0842 **OR** ticagrelor (BRILINTA) tablet 90 mg, 90 mg, Per Tube, BID, Deveshwar, Sanjeev, MD, 90 mg at 07/27/22 2120   Patients Current Diet:  Diet Order                  Diet regular Room service appropriate? Yes with Assist; Fluid consistency: Thin  Diet effective now                       Precautions / Restrictions Precautions Precautions:  Fall Precaution Comments: right sided hemiparesis, receptive/expressive deficits Restrictions Weight Bearing Restrictions: No    Has the patient had 2 or more falls or a fall with injury in the past year? No   Prior Activity Level Community (5-7x/wk): Independent, working, driving, Designer, industrial/product for [employer], power w/c equipment   Prior Functional Level Self Care: Did the patient need help bathing, dressing, using the toilet or eating? Independent   Indoor Mobility: Did the patient need assistance with walking from room to room (with or without device)? Independent   Stairs:  Did the patient need assistance with internal or external stairs (with or without device)? Independent   Functional Cognition: Did the patient need help planning regular tasks such as shopping or remembering to take medications? Independent   Patient Information Are you of Hispanic, Latino/a,or Spanish origin?: X. Patient unable to respond (Joseph states no) What is your race?: X. Patient unable to respond (Joseph states black) Do you need or want an interpreter to communicate with a doctor or health care staff?: 9. Unable to respond (Joseph states no)   Patient's Response To:  Health Literacy and Transportation Is the patient able to respond to health literacy and transportation needs?: No Health Literacy - How often do you need to have someone help you when you read instructions, pamphlets, or other written material from your doctor or pharmacy?: Patient unable to respond In the past 12 months, has lack of transportation kept you from medical appointments or from getting medications?: No (no per Joseph) In the past 12 months, has lack of transportation kept you from meetings, work, or from getting things needed for daily living?: No (no per Joseph)   Journalist, newspaper / Equipment Home Assistive Devices/Equipment: None Home Equipment: None   Prior Device Use: Indicate devices/aids used by the patient prior to  current illness, exacerbation or injury? None of the above   Current Functional Level Cognition   Arousal/Alertness: Awake/alert Overall Cognitive Status: Difficult to assess Difficult to assess due to: Impaired communication Current Attention Level: Sustained Orientation Level: Oriented to person, Oriented to place, Disoriented to time, Disoriented to situation Following Commands: Follows one step commands consistently, Follows one step commands with increased time Safety/Judgement: Decreased awareness of deficits, Decreased awareness of safety (R inattention) General Comments: pt with noted word finding difficulty Attention: Sustained Sustained Attention: Appears intact    Extremity Assessment (includes Sensation/Coordination)   Upper Extremity Assessment: Defer to OT evaluation RUE Deficits / Details: Brunnstrum stage I in the arm with no active movement noted in the arm or digits. RUE Sensation:  (unable to determine secondary to global communication deficits) RUE Coordination: decreased fine motor, decreased gross motor  Lower Extremity Assessment: RLE deficits/detail RLE Deficits / Details: PROM difficult with increased tone throughout, no active movement noted RLE Coordination: decreased gross motor, decreased fine motor     ADLs   Overall ADL's : Needs assistance/impaired Grooming: Wash/dry face, Oral care, Minimal assistance, Sitting Grooming Details (indicate cue type and reason): in recliner with assistance for toothpaste and instructions on one handed techniques Toilet Transfer: Maximal assistance, +2 for physical assistance, Stand-pivot Toilet Transfer Details (indicate cue type and reason): simulated to the recliner Toileting- Clothing Manipulation and Hygiene: Sit to/from stand, Maximal assistance Toileting - Clothing Manipulation Details (indicate cue type and reason): simulated standing Functional mobility during ADLs: Maximal assistance, +2 for safety/equipment (sit  to stand and for squat pivot transfer) General ADL Comments: focused on sitting balance on EOB     Mobility   Overal bed mobility: Needs Assistance Bed Mobility: Sidelying to Sit, Sit to Sidelying Rolling: Mod assist Sidelying to sit: Max assist Supine to sit: Mod assist, HOB elevated Sit to supine: Mod assist, HOB elevated Sit to sidelying: Mod assist General bed mobility comments: verbal and tactile cues, good initiation with LUE and LLE, but assist to move RLE out of bed and to elevate trunk. once seated, pt with good balance     Transfers   Overall transfer level: Needs assistance Equipment used: Ambulation equipment used Transfers: Sit to/from  Stand Sit to Stand: Min assist, From elevated surface, Mod assist Bed to/from chair/wheelchair/BSC transfer type:: Via Lift equipment Stand pivot transfers: Total assist Squat pivot transfers: Max assist, +2 physical assistance Transfer via Lift Equipment: Stedy General transfer comment: minAx2 for sit to stand up into stedy with bed elevated and use of L UE, however maxA at R hip and knee to maintain equal weight bearing vs L lateral bias and R knee in extension vs flexion, maxA to keep R hand on stedy, worked on sit to stands from seat of stedy without using L UE to pull up and maxA to keep weight shifted to R side to promote R LE and UE strengthening, 10 sit to stands without L UE use completed, tech to aide in minimize using L UE     Ambulation / Gait / Stairs / Wheelchair Mobility   Ambulation/Gait General Gait Details: unable Pre-gait activities: standing wt shift in stedy with attempt to facilitate R knee extension/quad activation     Posture / Balance Dynamic Sitting Balance Sitting balance - Comments: pt able to maintain midline posture this date Balance Overall balance assessment: Needs assistance Sitting-balance support: Single extremity supported, Feet supported Sitting balance-Leahy Scale: Fair Sitting balance - Comments: pt  able to maintain midline posture this date Postural control: Right lateral lean Standing balance support: Bilateral upper extremity supported, During functional activity, Reliant on assistive device for balance Standing balance-Leahy Scale: Poor Standing balance comment: reliant on stedy for support, L lateral bias, worked on bringing hips to neutral and R knee extension, however pt with no active R knee extension     Special needs/care consideration Presumed noncompliance with HTN meds due to no med filled for 6 months    Previous Home Environment  Living Arrangements: Spouse/significant other  Lives With: Spouse Available Help at Discharge: Available 24 hours/day (Joseph works from home) Type of Home: Apartment Home Layout: One level Home Access: Stairs to enter Entrance Stairs-Rails: Left, Right Entrance Stairs-Number of Steps: 15; 2nd floor apartment Bathroom Shower/Tub: Engineer, manufacturing systems: Standard Bathroom Accessibility: Yes How Accessible: Accessible via walker, Accessible via wheelchair Home Care Services: No   Discharge Living Setting Plans for Discharge Living Setting: Patient's home, Lives with (comment), Apartment (spouse) Type of Home at Discharge: Apartment Discharge Home Layout: One level Discharge Home Access: Stairs to enter Entrance Stairs-Rails: Right, Left, Can reach both Entrance Stairs-Number of Steps: 15 Discharge Bathroom Shower/Tub: Tub/shower unit Discharge Bathroom Toilet: Standard Discharge Bathroom Accessibility: Yes How Accessible: Accessible via walker Does the patient have any problems obtaining your medications?: No   Social/Family/Support Systems Patient Roles: Spouse, Parent, Other (Comment) Pharmacist, community) Contact Information: Joseph, Joseph Bright Anticipated Caregiver: Joseph Anticipated Caregiver's Contact Information: see contacts Ability/Limitations of Caregiver: works from home Caregiver Availability: 24/7 Discharge Plan  Discussed with Primary Caregiver: Yes Is Caregiver In Agreement with Plan?: Yes Does Caregiver/Family have Issues with Lodging/Transportation while Pt is in Rehab?: No   Goals Patient/Family Goal for Rehab: min assist PT, OT and SLP Expected length of stay: ELOS 21 to 28 days Additional Information: I have requested Joseph to seek first floor apartment with their complex. They have leased there for 8 years Pt/Family Agrees to Admission and willing to participate: Yes Program Orientation Provided & Reviewed with Pt/Caregiver Including Roles  & Responsibilities: Yes Additional Information Needs: barrier to d/c is 2nd level apartment   Decrease burden of Care through IP rehab admission: n/a   Possible need for SNF placement upon discharge: not anticipated  Patient Condition: I have reviewed medical records from [facility], spoken with CM, and patient and spouse. I met with patient at the bedside and discussed via phone for inpatient rehabilitation assessment.  Patient will benefit from ongoing PT, OT, and SLP, can actively participate in 3 hours of therapy a day 5 days of the week, and can make measurable gains during the admission.  Patient will also benefit from the coordinated team approach during an Inpatient Acute Rehabilitation admission.  The patient will receive intensive therapy as well as Rehabilitation physician, nursing, social worker, and care management interventions.  Due to bladder management, bowel management, safety, skin/wound care, disease management, medication administration, pain management, and patient education the patient requires 24 hour a day rehabilitation nursing.  The patient is currently mod to max assist overall with mobility and basic ADLs.  Discharge setting and therapy post discharge at home with home health is anticipated.  Patient has agreed to participate in the Acute Inpatient Rehabilitation Program and will admit today.   Preadmission Screen Completed  By:  Clois Dupes, RN MSN 08/01/2022 3:58 PM ______________________________________________________________________   Discussed status with Dr. Riley Kill on 08/01/22 at 1559 and received approval for admission today.   Admission Coordinator:  Clois Dupes, RN MSN time 1610 Date 08/01/22    Assessment/Plan: Diagnosis:Left ACA infarct Does the need for close, 24 hr/day Medical supervision in concert with the patient's rehab needs make it unreasonable for this patient to be served in a less intensive setting? Yes Co-Morbidities requiring supervision/potential complications: HTN, CKD IV Due to bladder management, bowel management, safety, skin/wound care, disease management, medication administration, pain management, and patient education, does the patient require 24 hr/day rehab nursing? yes Does the patient require coordinated care of a physician, rehab nurse, PT, OT, and SLP to address physical and functional deficits in the context of the above medical diagnosis(es)? Yes Addressing deficits in the following areas: balance, endurance, locomotion, strength, transferring, bowel/bladder control, bathing, dressing, feeding, grooming, toileting, cognition, speech, language, and psychosocial support Can the patient actively participate in an intensive therapy program of at least 3 hrs of therapy 5 days a week? Yes The potential for patient to make measurable gains while on inpatient rehab is excellent Anticipated functional outcomes upon discharge from inpatient rehab: min assist PT, min assist OT, min assist SLP Estimated rehab length of stay to reach the above functional goals is: 21-28 days Anticipated discharge destination: Home 10. Overall Rehab/Functional Prognosis: excellent     MD Signature: Joseph Oyster, MD, [facility] Physical Medicine & Rehabilitation Medical Director Rehabilitation Services 08/01/2022          Revision History

## 2022-08-01 NOTE — Progress Notes (Signed)
Inpatient Rehabilitation Admissions Coordinator   I await insurance approval for possible Cir admit.  Ottie Glazier, RN, MSN Rehab Admissions Coordinator [phone] 08/01/2022 12:31 PM

## 2022-08-01 NOTE — Plan of Care (Signed)
  Problem: Consults Goal: RH STROKE PATIENT EDUCATION Description: See Patient Education module for education specifics  Outcome: Progressing   Problem: RH BOWEL ELIMINATION Goal: RH STG MANAGE BOWEL WITH ASSISTANCE Description: STG Manage Bowel with min Assistance. Outcome: Progressing Goal: RH STG MANAGE BOWEL W/MEDICATION W/ASSISTANCE Description: STG Manage Bowel with Medication with min Assistance. Outcome: Progressing   Problem: RH BLADDER ELIMINATION Goal: RH STG MANAGE BLADDER WITH ASSISTANCE Description: STG Manage Bladder With min Assistance Outcome: Progressing   Problem: RH SKIN INTEGRITY Goal: RH STG SKIN FREE OF INFECTION/BREAKDOWN Description: Skin will remain intact and be free of infection/breakdown with min assist Outcome: Progressing Goal: RH STG MAINTAIN SKIN INTEGRITY WITH ASSISTANCE Description: STG Maintain Skin Integrity With min Assistance. Outcome: Progressing   Problem: RH SAFETY Goal: RH STG ADHERE TO SAFETY PRECAUTIONS W/ASSISTANCE/DEVICE Description: STG Adhere to Safety Precautions With cuing Assistance/Device. Outcome: Progressing Goal: RH STG DECREASED RISK OF FALL WITH ASSISTANCE Description: STG Decreased Risk of Fall With min Assistance. Outcome: Progressing   Problem: RH COGNITION-NURSING Goal: RH STG USES MEMORY AIDS/STRATEGIES W/ASSIST TO PROBLEM SOLVE Description: STG Uses Memory Aids/Strategies With cueing Assistance to Problem Solve. Outcome: Progressing Goal: RH STG ANTICIPATES NEEDS/CALLS FOR ASSIST W/ASSIST/CUES Description: STG Anticipates Needs/Calls for Assist With cueing Assistance/Cues. Outcome: Progressing   Problem: RH PAIN MANAGEMENT Goal: RH STG PAIN MANAGED AT OR BELOW PT'S PAIN GOAL Description: Pain will be managed 4 out of 10 on pain scale with PRN medications min assist  Outcome: Progressing   Problem: RH KNOWLEDGE DEFICIT Goal: RH STG INCREASE KNOWLEDGE OF HYPERTENSION Description: Patient/caregiver will be  able to manage HTN medications and lifestyle modifications to improve blood pressure from nursing education and nursing handouts independently  Outcome: Progressing Goal: RH STG INCREASE KNOWLEGDE OF HYPERLIPIDEMIA Description: Patient/caregiver will be able to manage cholesterol medications and diet modifications to improve overall cholesterol and trig from nursing education and nursing handouts independently  Outcome: Progressing Goal: RH STG INCREASE KNOWLEDGE OF STROKE PROPHYLAXIS Description: Patient/caregiver will be able to manage stroke medications (ASA and Brilinta) from nursing education and nursing handouts independently Outcome: Progressing

## 2022-08-01 NOTE — Telephone Encounter (Signed)
Patient remains in hospital.

## 2022-08-02 DIAGNOSIS — I6329 Cerebral infarction due to unspecified occlusion or stenosis of other precerebral arteries: Secondary | ICD-10-CM | POA: Diagnosis not present

## 2022-08-02 LAB — COMPREHENSIVE METABOLIC PANEL
ALT: 46 U/L — ABNORMAL HIGH (ref 0–44)
AST: 35 U/L (ref 15–41)
Albumin: 3.5 g/dL (ref 3.5–5.0)
Alkaline Phosphatase: 69 U/L (ref 38–126)
Anion gap: 11 (ref 5–15)
BUN: 70 mg/dL — ABNORMAL HIGH (ref 6–20)
CO2: 21 mmol/L — ABNORMAL LOW (ref 22–32)
Calcium: 9 mg/dL (ref 8.9–10.3)
Chloride: 105 mmol/L (ref 98–111)
Creatinine, Ser: 3.24 mg/dL — ABNORMAL HIGH (ref 0.61–1.24)
GFR, Estimated: 23 mL/min — ABNORMAL LOW (ref >60)
Glucose, Bld: 106 mg/dL — ABNORMAL HIGH (ref 70–99)
Potassium: 5.3 mmol/L — ABNORMAL HIGH (ref 3.5–5.1)
Sodium: 137 mmol/L (ref 135–145)
Total Bilirubin: 0.4 mg/dL (ref 0.3–1.2)
Total Protein: 7.4 g/dL (ref 6.5–8.1)

## 2022-08-02 LAB — GLUCOSE, CAPILLARY
Glucose-Capillary: 91 mg/dL (ref 70–99)
Glucose-Capillary: 102 mg/dL — ABNORMAL HIGH (ref 70–99)
Glucose-Capillary: 70 mg/dL (ref 70–99)
Glucose-Capillary: 73 mg/dL (ref 70–99)

## 2022-08-02 LAB — CBC WITH DIFFERENTIAL/PLATELET
Abs Immature Granulocytes: 0.08 10*3/uL — ABNORMAL HIGH (ref 0.00–0.07)
Basophils Absolute: 0.1 10*3/uL (ref 0.0–0.1)
Basophils Relative: 1 %
Eosinophils Absolute: 0.6 10*3/uL — ABNORMAL HIGH (ref 0.0–0.5)
Eosinophils Relative: 6 %
HCT: 44.2 % (ref 39.0–52.0)
Hemoglobin: 14.5 g/dL (ref 13.0–17.0)
Immature Granulocytes: 1 %
Lymphocytes Relative: 12 %
Lymphs Abs: 1.1 10*3/uL (ref 0.7–4.0)
MCH: 30.9 pg (ref 26.0–34.0)
MCHC: 32.8 g/dL (ref 30.0–36.0)
MCV: 94.2 fL (ref 80.0–100.0)
Monocytes Absolute: 1.2 10*3/uL — ABNORMAL HIGH (ref 0.1–1.0)
Monocytes Relative: 13 %
Neutro Abs: 5.7 10*3/uL (ref 1.7–7.7)
Neutrophils Relative %: 67 %
Platelets: 238 10*3/uL (ref 150–400)
RBC: 4.69 MIL/uL (ref 4.22–5.81)
RDW: 13.2 % (ref 11.5–15.5)
WBC: 8.6 10*3/uL (ref 4.0–10.5)
nRBC: 0 % (ref 0.0–0.2)

## 2022-08-02 MED ORDER — HYDROCHLOROTHIAZIDE 25 MG PO TABS
25.0000 mg | ORAL_TABLET | Freq: Every day | ORAL | Status: DC
  Administered 2022-08-02 – 2022-08-03 (×2): 25 mg via ORAL
  Filled 2022-08-02 (×2): qty 1

## 2022-08-02 NOTE — Progress Notes (Signed)
Patient up in chair. Patient C/O headache this AM PRN Tylenol given as ordered. Patients BP elevated, Dan A. PA. No new orders per Jesusita Oka patient to been seen by Nephrology. Patient put back to bed, lights turned down, cool towel provided.

## 2022-08-02 NOTE — Progress Notes (Signed)
Joseph Bright Admit Date: 08/01/2022 08/02/2022 Joseph Bright Requesting Physician:  Dr. Riley Kill  Reason for Consult:  CKD IV  HPI:  Joseph Bright is a 47 year old male who presented on 07/25/2022 with acute right-sided weakness and dysarthria. CTA positive for acute occlusion of left ACA and MRI showing additional scattered ischemic foci in R MCA, PCA and cerebellar artery teritories. S/p TKA and stenting. Echo showing EF 70-75% and G1DD, TEE w/ no cardio embolic source and loop recorder placed 5/3.  Due to seizure-like activity during hospitalization, patient has been continued on Keppra 500 mg twice daily, EEG showed no seizure activity.  For CVA, pt continued on aspirin and Brilinta and patient was admitted to CIR on 08/01/2022.  Of note, during hospitalization patient has required Lokelma due to hyperkalemia.   PMH Incudes: HTN, CKD IV (follows with [facility])   Creatinine, Ser (mg/dL)  Date Value  82/95/6213 3.24 (H)  08/01/2022 3.13 (H)  08/01/2022 3.05 (H)  07/31/2022 3.01 (H)  07/30/2022 3.05 (H)  07/29/2022 2.96 (H)  07/28/2022 2.79 (H)  07/27/2022 3.23 (H)  07/26/2022 3.41 (H)  07/25/2022 3.60 (H)    ROS NSAIDS: No IV Contrast No TMP/SMX No Hypotension No Balance of 12 systems is negative w/ exceptions as above  PMH  Past Medical History:  Diagnosis Date   Hypertension    Stroke (HCC)    PSH  Past Surgical History:  Procedure Laterality Date   IR PERCUTANEOUS ART THROMBECTOMY/INFUSION INTRACRANIAL INC DIAG ANGIO  07/25/2022   LOOP RECORDER INSERTION N/A 07/28/2022   Procedure: LOOP RECORDER INSERTION;  Surgeon: Regan Lemming, MD;  Location: [facility] INVASIVE CV LAB;  Service: Cardiovascular;  Laterality: N/A;   RADIOLOGY WITH ANESTHESIA N/A 07/25/2022   Procedure: RADIOLOGY WITH ANESTHESIA;  Surgeon: Radiologist, Medication, MD;  Location: [facility] OR;  Service: Radiology;  Laterality: N/A;   TEE WITHOUT CARDIOVERSION N/A 07/28/2022   Procedure: TRANSESOPHAGEAL ECHOCARDIOGRAM;   Surgeon: Parke Poisson, MD;  Location: [facility] INVASIVE CV LAB;  Service: Cardiovascular;  Laterality: N/A;   FH History reviewed. No pertinent family history. SH  reports that he quit smoking about 22 years ago. His smoking use included cigarettes. He started smoking about 27 years ago. He has never used smokeless tobacco. He reports current alcohol use. He reports that he does not use drugs. Allergies No Known Allergies Home medications Prior to Admission medications   Medication Sig Start Date End Date Taking? Authorizing Provider  amLODipine (NORVASC) 10 MG tablet Take 10 mg by mouth daily.    [provider]  hydrALAZINE (APRESOLINE) 50 MG tablet Take 50 mg by mouth 2 (two) times daily.    [provider]  isosorbide mononitrate (IMDUR) 60 MG 24 hr tablet Take 60 mg by mouth daily.    [provider]  labetalol (NORMODYNE) 200 MG tablet Take 200 mg by mouth 2 (two) times daily.    [provider]  simvastatin (ZOCOR) 20 MG tablet Take 20 mg by mouth daily.    [provider]  ticagrelor (BRILINTA) 90 MG TABS tablet Take 1 tablet (90 mg total) by mouth 2 (two) times daily. 07/31/22   Kennieth Francois, PA  traMADol (ULTRAM) 50 MG tablet TAKE 1 TO 2 TABLETS(50 TO 100 MG) BY MOUTH AT BEDTIME AS NEEDED Patient not taking: Reported on 07/25/2022 06/25/18   Hilts, Casimiro Needle, MD    Current Medications Scheduled Meds:  aspirin  81 mg Oral Daily   docusate sodium  100 mg Oral BID  feeding supplement (NEPRO CARB STEADY)  237 mL Oral BID BM   heparin  5,000 Units Subcutaneous Q8H   hydrochlorothiazide  25 mg Oral Daily   insulin aspart  0-15 Units Subcutaneous TID WC   levETIRAcetam  500 mg Oral Q2200   polyethylene glycol  17 g Oral Daily   rosuvastatin  20 mg Per Tube Daily   sodium bicarbonate  650 mg Oral TID   ticagrelor  90 mg Oral BID   Continuous Infusions: PRN Meds:.acetaminophen **OR** acetaminophen (TYLENOL) oral liquid 160 mg/5 mL **OR**  acetaminophen  CBC Recent Labs  Lab 07/31/22 0342 08/01/22 0620 08/02/22 0504  WBC 9.4 8.2 8.6  NEUTROABS  --   --  5.7  HGB 14.6 14.7 14.5  HCT 43.1 44.5 44.2  MCV 91.9 93.9 94.2  PLT 232 229 238   Basic Metabolic Panel Recent Labs  Lab 07/26/22 1511 07/26/22 1825 07/27/22 0624 07/27/22 0624 07/27/22 1936 07/28/22 0704 07/29/22 0548 07/30/22 0247 07/31/22 0342 08/01/22 0620 08/01/22 1657 08/02/22 0504  NA  --   --  140   < >  --  139 139 136 138 132* 137 137  K  --   --  4.3   < >  --  4.9 5.2* 5.0 5.4* 5.8* 4.7 5.3*  CL  --   --  109   < >  --  109 107 107 108 103 104 105  CO2  --   --  22   < >  --  21* 21* 22 20* 18* 21* 21*  GLUCOSE  --   --  133*   < >  --  105* 101* 101* 104* 95 100* 106*  BUN  --   --  41*   < >  --  38* 47* 57* 62* 68* 69* 70*  CREATININE  --   --  3.23*   < >  --  2.79* 2.96* 3.05* 3.01* 3.05* 3.13* 3.24*  CALCIUM  --   --  9.0   < >  --  9.2 9.2 9.0 9.2 9.1 9.0 9.0  PHOS 4.6 4.3 4.0  --  3.9  --   --   --   --   --   --   --    < > = values in this interval not displayed.    Physical Exam  Blood pressure (!) 136/98, pulse 74, temperature 97.8 F (36.6 C), resp. rate 18, height 5\' 9"  (1.753 m), weight 107.7 kg, SpO2 99 %. GEN: 47 year old male, sitting up in chair, NAD EYES: White sclera, clear conjunctiva CV: RRR, normal S1/S2 PULM: CTAB, normal effort ABD: Soft, nontender palpation, nondistended SKIN: Warm and dry EXT: No edema BLEs Neuro: Patient is alert and answering questions appropriately   Assessment  CKD IV: Cr at base line (~2.7-3.2)  3.24 today. BUN up trending, 38 (07/28/22)>60's>70 today. 24 hr UOP 1.9 L and patient appears euvolemic on exam. Follows out patient with [facility]. Had previously been placed on HCTZ and an ARB out patient but these meds were not on home med list in EPIC. Kidney function is currently stable at baseline.  Hyperkalemia: in setting of CKD IV. K 5.3 this am s/p Lokelma 10 mg yesterday d/t K of 5.8 and  Lokelma 5 mg 5/6 d/t K of 5.4. Has not been on home HCTZ during hospitalization. L ACA CVA s/p TNK and stenting w/ functional deficits now on ASA and Brilinta and Keppra for seizure prophylaxis   Plan  CKD IV: Will CTM labs. Resume HCTZ 25 mg daily today but continue to hold arb in setting of hyperkalemia.  Will consider adding arb before d/c if appropriate and possibly SGLT2.  encourage PO intake.  Hyperkalemia: Continue HCTZ 25 mg daily, CTM labs CVA with deficits and other medical issues per PM&R    Joseph Bright , DO Family Medicine, PYG-2

## 2022-08-02 NOTE — Progress Notes (Signed)
Inpatient Rehabilitation  Patient information reviewed and entered into eRehab system by Lynia Landry M. Ezell Poke, M.A., CCC/SLP, PPS Coordinator.  Information including medical coding, functional ability and quality indicators will be reviewed and updated through discharge.    

## 2022-08-02 NOTE — Evaluation (Addendum)
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 (HCC)   Past Medical History:  Past Medical History:  Diagnosis Date   Hypertension    Stroke [facility])    Past Surgical History:  Past Surgical History:  Procedure Laterality Date   IR PERCUTANEOUS ART THROMBECTOMY/INFUSION INTRACRANIAL INC DIAG ANGIO  07/25/2022   LOOP RECORDER INSERTION N/A 07/28/2022   Procedure: LOOP RECORDER INSERTION;  Surgeon: Regan Lemming, MD;  Location: [facility] INVASIVE CV LAB;  Service: Cardiovascular;  Laterality: N/A;   RADIOLOGY WITH ANESTHESIA N/A 07/25/2022   Procedure: RADIOLOGY WITH ANESTHESIA;  Surgeon: Radiologist, Medication, MD;  Location: [facility] OR;  Service: Radiology;  Laterality: N/A;   TEE WITHOUT CARDIOVERSION N/A 07/28/2022   Procedure: TRANSESOPHAGEAL ECHOCARDIOGRAM;  Surgeon: Parke Poisson, MD;  Location: [facility] INVASIVE CV LAB;  Service: Cardiovascular;  Laterality: N/A;    Assessment & Plan Clinical Impression: Patient is a 47 y.o. year old right-handed male with history of hypertension, CKD stage IV followed at outpatient nephrology, as well as medical noncompliance, quit smoking 22 years ago.  Per chart review patient lives with spouse-year-old daughter.  1 level home multiple steps to entry.  Independent prior to admission.  Presented 07/25/2022 with acute onset of right-sided weakness as well as speech difficulty.  Cranial CT scan negative for acute changes.  There was chronic appearing right thalamic lacunar infarct.   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  Status post TNK with interventional radiology for stenting.  MRI follow-up showed confluent left ACA territory infarction.  Cytotoxic edema with no hemorrhagic transformation.  Multiple additional scattered mostly punctate ischemic foci in the left greater than right MCA, PCA and occasionally cerebellar artery territories.  No significant mass effect.  Echocardiogram with ejection fraction of 70 to 75% grade 1 diastolic dysfunction.  Patient with reported seizure-like activity.  EEG negative for seizure.  Patient was maintained on Keppra for seizure prophylaxis.  TEE unremarkable no PFO, no cardioembolic source.  Neurology follow-up maintained on aspirin as well as Brilinta 90 mg twice daily for CVA prophylaxis.  Underwent loop recorder placement 07/28/2022.  Subcutaneous heparin added for DVT prophylaxis.  Initially on tube feeds for nutritional support her diet has been advanced to regular.  Bouts of hyperkalemia patient did require Lokelma with latest potassium 5.8 and follow-up chemistries pending.  Therapy evaluations completed due to patient's decreased functional mobility and right-sided weakness was admitted for a comprehensive rehab program.   Patient currently requires  mod assist to +2 assist  with mobility secondary to muscle weakness, muscle joint tightness, and muscle paralysis, decreased cardiorespiratoy endurance, impaired timing and sequencing, abnormal tone, unbalanced muscle activation, motor apraxia, decreased coordination, and decreased motor planning, decreased midline orientation and decreased attention to right, decreased attention, and decreased sitting balance, decreased standing balance, decreased postural control, hemiplegia, and decreased balance strategies.  Prior to hospitalization, patient was independent  with mobility and lived with Spouse in a Apartment home.  Home access is 15; 2nd floor  apartmentStairs to enter.  Patient will benefit from skilled PT intervention  to maximize safe functional mobility, minimize fall risk, and decrease caregiver burden for planned discharge home with 24 hour assist.  Anticipate patient will  HHPT vs OPPT  at discharge.  PT - End of Session Activity Tolerance: Decreased this session Endurance Deficit: Yes Endurance Deficit Description: c/o headache/dizziness - hard to discern due to expressive aphasia and inconsistant yes/no responses. BP elevated though PT Assessment Rehab Potential (ACUTE/IP ONLY): Good PT Barriers to Discharge: Inaccessible home environment;Decreased caregiver support PT Plan PT Intensity: Minimum of 1-2 x/day ,45 to 90 minutes PT Frequency: 5 out of 7 days PT Duration Estimated Length of Stay: 3-3.5 weeks PT Treatment/Interventions: Ambulation/gait training;Balance/vestibular training;Cognitive remediation/compensation;Community reintegration;Discharge planning;Disease management/prevention;DME/adaptive equipment instruction;Functional electrical stimulation;Functional mobility training;Neuromuscular re-education;Pain management;Patient/family education;Psychosocial support;Skin care/wound management;Splinting/orthotics;Stair training;Therapeutic Activities;Therapeutic Exercise;UE/LE Strength taining/ROM;UE/LE Coordination activities;Visual/perceptual remediation/compensation;Wheelchair propulsion/positioning PT Transfers Anticipated Outcome(s): min assist PT Locomotion Anticipated Outcome(s): min to mod assist short distance ambulation; supervision to mod I w/c mobility PT Recommendation Recommendations for Other Services: Therapeutic Recreation consult;Neuropsych consult Therapeutic Recreation Interventions: Outing/community reintergration;Stress management Follow Up Recommendations: Home health PT;Outpatient PT;24 hour supervision/assistance Patient destination: Home Equipment Recommended: Wheelchair cushion  (measurements);Wheelchair (measurements);To be determined   PT Evaluation Precautions/Restrictions Precautions Precautions: Fall Precaution Comments: right sided hemiparesis, expressive deficits Restrictions Weight Bearing Restrictions: No  Pain Does not rate but reports pain in R shoulder, RUE and RLE as "sore". Repositioned as needed. Educated on positioning especially for RUE due to flaccid extremity. Pain Interference Pain Interference Pain Effect on Sleep: 2. Occasionally Pain Interference with Therapy Activities: 2. Occasionally Pain Interference with Day-to-Day Activities: 2. Occasionally Home Living/Prior Functioning Home Living Available Help at Discharge: Available 24 hours/day Type of Home: Apartment Home Access: Stairs to enter [employer] of Steps: 15; 2nd floor apartment Entrance Stairs-Rails: Left;Right Home Layout: One level Bathroom Shower/Tub: Tub/shower unit  Lives With: Spouse Prior Function Level of Independence: Independent with gait  Able to Take Stairs?: Yes Driving: Yes Vision/Perception  Vision - History Baseline Vision: No visual deficits Ability to See in Adequate Light: 0 Adequate Vision - Assessment Eye Alignment: Within Functional Limits Ocular Range of Motion: Within Functional Limits Tracking/Visual Pursuits: Able to track stimulus in all quads without difficulty Convergence: Within functional limits Perception Perception: Impaired Inattention/Neglect: Does not attend to right side of body;Does not attend to right visual field Praxis Praxis: Impaired Praxis Impairment Details: Motor planning  Cognition Arousal/Alertness: Awake/alert Attention: Sustained Sustained Attention: Appears intact Memory: Appears intact Awareness: Appears intact Safety/Judgment: Appears intact Sensation Sensation Light Touch: Impaired Detail Light Touch Impaired Details: Impaired RLE;Impaired RUE Proprioception: Impaired  Detail Proprioception Impaired Details: Impaired RUE;Impaired RLE Coordination Gross Motor Movements are Fluid and Coordinated: No Fine Motor Movements are Fluid and Coordinated: No Motor  Motor Motor: Motor apraxia;Abnormal postural alignment and control;Abnormal tone;Hemiplegia Motor - Skilled Clinical Observations: R hemi   Trunk/Postural Assessment  Cervical Assessment Cervical Assessment: Within Functional Limits Thoracic Assessment Thoracic Assessment:  (rounded shoulders) Lumbar Assessment Lumbar Assessment:  (posterior pelvic tilt) Postural Control Postural Control: Deficits on evaluation Trunk Control: delayed and inadquate Righting Reactions: delayed Protective Responses: delayed  Balance Balance Balance Assessed: Yes Static Sitting Balance Static Sitting - Balance Support: No upper extremity supported;Feet supported Static Sitting - Level of Assistance: 5: Stand by assistance Dynamic Sitting Balance Dynamic Sitting - Balance Support: During functional activity Dynamic Sitting - Level of Assistance: 4: Min assist Static Standing Balance Static Standing - Level of Assistance: 3: Mod assist Dynamic Standing Balance Dynamic Standing - Level of Assistance: 2: Max assist  Extremity Assessment  RUE Assessment RUE Assessment: Exceptions to [facility] RUE Body System: Neuro Brunstrum levels for arm and hand: Arm;Hand Brunstrum level for arm: Stage I Presynergy Brunstrum level for hand: Stage I Flaccidity RUE Tone RUE Tone: Flaccid;Modified Ashworth Body Part - Modified Ashworth Scale: Elbow;Wrist;Fingers;Thumb Elbow - Modified Ashworth Scale for Grading Hypertonia RUE: No increase in muscle tone Wrist - Modified Ashworth Scale for Grading Hypertonia RUE: No increase in muscle tone Fingers - Modified Ashworth Scale for Grading Hypertonia RUE: No increase in muscle tone Thumb - Modified Ashworth Scale for Grading Hypertonia RUE: No increase in muscle tone LUE Assessment LUE  Assessment: Within Functional Limits RLE Assessment RLE Assessment: Exceptions to [facility] Passive Range of Motion (PROM) Comments: WFL General Strength Comments: no active movement noted RLE Tone RLE Tone:  (extensor tone noted during bed mobility) LLE Assessment LLE Assessment: Within Functional Limits  Care Tool Care Tool Bed Mobility Roll left and right activity   Roll left and right assist level: Moderate Assistance - Patient 50 - 74%    Sit to lying activity   Sit to lying assist level: Moderate Assistance - Patient 50 - 74%    Lying to sitting on side of bed activity   Lying to sitting on side of bed assist level: the ability to move from lying on the back to sitting on the side of the bed with no back support.: Maximal Assistance - Patient 25 - 49%     Care Tool Transfers Sit to stand transfer   Sit to stand assist level: Moderate Assistance - Patient 50 - 74%    Chair/bed transfer   Chair/bed transfer assist level: Maximal Assistance - Patient 25 - 49%     Toilet transfer   Assist Level: Maximal Assistance - Patient 24 - 49%    Car transfer   Car transfer assist level: 2 helpers      Care Tool Locomotion Ambulation   Assist level: 2 helpers (attempted but unable to tolerate)      Walk 10 feet activity Walk 10 feet activity did not occur: Safety/medical concerns       Walk 50 feet with 2 turns activity Walk 50 feet with 2 turns activity did not occur: Safety/medical concerns      Walk 150 feet activity Walk 150 feet activity did not occur: Safety/medical concerns      Walk 10 feet on uneven surfaces activity Walk 10 feet on uneven surfaces activity did not occur: Safety/medical concerns      Stairs Stair activity did not occur: Safety/medical concerns        Walk up/down 1 step activity Walk up/down 1 step or curb (drop down) activity did not occur: Safety/medical concerns      Walk up/down 4 steps activity Walk up/down 4 steps activity did not occur:  Safety/medical concerns      Walk up/down 12 steps activity Walk up/down 12 steps activity did not occur: Safety/medical concerns      Pick up small objects from floor   Pick up small object from the floor assist level: Dependent - Patient 0%    Wheelchair Is the patient using a wheelchair?: Yes Type of Wheelchair: Manual (TIS w/c)   Wheelchair assist level: Dependent - Patient 0%    Wheel 50 feet with 2 turns activity   Assist Level: Dependent - Patient 0%  Wheel 150 feet activity   Assist Level: Dependent - Patient 0%    Refer to Care Plan for Long Term  Goals  SHORT TERM GOAL WEEK 1 PT Short Term Goal 1 (Week 1): Pt will be able to perform mod assist transfers to the R PT Short Term Goal 2 (Week 1): Pt will be able to perform w/c propulsion with min assist x 50' PT Short Term Goal 3 (Week 1): Pt will be able to initiate gait training PT Short Term Goal 4 (Week 1): Pt will be able to perform bed mobility with min assist  Recommendations for other services: Neuropsych and Therapeutic Recreation  Stress management and Outing/community reintegration  Skilled Therapeutic Intervention  Evaluation completed (see details above and below) with education on PT POC and goals and individual treatment initiated with focus on neuro re-ed during functional transfers, bed mobilty retraining on flat surface, sit <> stands, and pre=gait assessment. Pt appears to have some discomfort in head and difficulty discerning if dizziness, pain, etc due to communication deficits. Did not BP to be elevated and symptoms increased with exertion but resolved with frequent rest breaks. BP = 134/104 mmHg ; MAP = 115; HR = 80 bpm. BP = 142/107 mmHg; MAP = 120; HR = 88 bpm. RN was notified.   Mobility Bed Mobility Bed Mobility: Rolling Left;Sit to Supine;Supine to Sit Rolling Left: Moderate Assistance - Patient 50-74% Supine to Sit: Maximal Assistance - Patient - Patient 25-49% Sit to Supine: Moderate  Assistance - Patient 50-74% Transfers Transfers: Sit to Stand;Stand to Sit;Stand Pivot Transfers;Squat Pivot Transfers Sit to Stand: Moderate Assistance - Patient 50-74% Stand to Sit: Moderate Assistance - Patient 50-74% Stand Pivot Transfers: Maximal Assistance - Patient 25 - 49% Stand Pivot Transfer Details: Manual facilitation for placement;Manual facilitation for weight shifting;Verbal cues for technique;Verbal cues for sequencing Squat Pivot Transfers: Moderate Assistance - Patient 50-74% Locomotion  Gait Gait Distance (Feet):  (attempted but unable to tolerate or stabilize fully on RLE in stance) Stairs / Additional Locomotion Stairs: No Wheelchair Mobility Wheelchair Mobility: Yes Wheelchair Assistance:  (TIS w/c; total assist) Wheelchair Parts Management: Needs assistance  Pt performed mod assist sit > stand and stand pivot to the L with cues for technique, facilitation for postural control and weightshifting and blocking of RLE. Attempted several sit <> stands from mat with good power up but poor ability to maintain postural control once in standing and no activation noted in RLE during WB. Transitioned to rail with mirror in front for visual feedback (pt reports this helped) and use of rail on L for support with mod assist overall and focused on weightshifting and achieving midline. Attempted to take small step with LLE but unable to sustain WB on R with total assist from therapist to block leg and then pt with decreased tolerance of activity (his head bothering him). Educated on importance of positioning and awareness of LUE and used pillow in chair. May need to see if lap tray will fit on this chair or work on switching him into a regular manual w/c (currently in TIS) to allow for better positioning as sitting balance is actually quite good.    Discharge Criteria: Patient will be discharged from PT if patient refuses treatment 3 consecutive times without medical reason, if treatment  goals not met, if there is a change in medical status, if patient makes no progress towards goals or if patient is discharged from hospital.  The above assessment, treatment plan, treatment alternatives and goals were discussed and mutually agreed upon: by patient  Delorise Royals, PT, DPT, CBIS  08/02/2022, 12:36 PM

## 2022-08-02 NOTE — Discharge Summary (Addendum)
Stroke Discharge Summary  Patient ID: Joseph Bright   MRN: [id number]      DOB: November 24, 1975  Date of Admission: 07/25/2022 Date of Discharge: 08/02/2022  Attending Physician:  No att. 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       ASK your doctor about these medications    amLODipine 10 MG tablet Commonly known as: NORVASC Take 10 mg by mouth daily.   hydrALAZINE 50 MG tablet Commonly known as: APRESOLINE Take 50 mg by mouth 2 (two) times daily.   isosorbide mononitrate 60 MG 24 hr tablet Commonly known as: IMDUR Take 60 mg by mouth daily.   labetalol 200 MG tablet Commonly known as: NORMODYNE Take 200 mg by mouth 2 (two) times daily.   simvastatin 20 MG tablet Commonly known as: ZOCOR Take 20 mg by mouth daily.   traMADol 50 MG tablet Commonly known as: ULTRAM TAKE 1 TO 2 TABLETS(50 TO 100 MG) BY MOUTH AT BEDTIME AS NEEDED        LABORATORY STUDIES CBC    Component Value Date/Time   WBC 8.6 08/02/2022 0504   RBC 4.69 08/02/2022 0504   HGB 14.5 08/02/2022 0504   HCT 44.2 08/02/2022 0504   PLT 238 08/02/2022 0504   MCV 94.2 08/02/2022 0504   MCH 30.9 08/02/2022 0504   MCHC 32.8 08/02/2022 0504   RDW 13.2 08/02/2022 0504   LYMPHSABS 1.1 08/02/2022 0504   MONOABS 1.2 (H) 08/02/2022 0504   EOSABS 0.6 (H)  08/02/2022 0504   BASOSABS 0.1 08/02/2022 0504   CMP    Component Value Date/Time   NA 137 08/02/2022 0504   K 5.3 (H) 08/02/2022 0504   CL 105 08/02/2022 0504   CO2 21 (L) 08/02/2022 0504   GLUCOSE 106 (H) 08/02/2022 0504   BUN 70 (H) 08/02/2022 0504   CREATININE 3.24 (H) 08/02/2022 0504   CALCIUM 9.0 08/02/2022 0504   CALCIUM 8.9 11/20/2006 1735   PROT 7.4 08/02/2022 0504   ALBUMIN 3.5 08/02/2022 0504   AST 35 08/02/2022 0504   ALT 46 (H) 08/02/2022 0504   ALKPHOS 69 08/02/2022 0504   BILITOT 0.4 08/02/2022 0504   GFRNONAA 23 (L) 08/02/2022 0504   GFRAA (L) 11/23/2006 0522    19        The eGFR has been calculated using the MDRD equation. This calculation has not been validated in all clinical   COAGS Lab Results  Component Value Date   INR 1.0 07/25/2022   Lipid Panel    Component Value Date/Time   CHOL 165 07/27/2022 0624   TRIG 193 (H) 08/01/2022 0620   HDL 28 (L) 07/27/2022 0624   CHOLHDL 5.9 07/27/2022 0624   VLDL 63 (H) 07/27/2022 0624   LDLCALC 74 07/27/2022 0624   HgbA1C  Lab Results  Component Value Date   HGBA1C 5.6 07/25/2022   Urinalysis    Component Value Date/Time   COLORURINE YELLOW 11/19/2006 1510   APPEARANCEUR CLOUDY (A) 11/19/2006 1510   LABSPEC 1.019 11/19/2006 1510   PHURINE 6.0 11/19/2006 1510   GLUCOSEU NEGATIVE 11/19/2006 1510   HGBUR LARGE (A) 11/19/2006 1510   BILIRUBINUR NEGATIVE 11/19/2006 1510   KETONESUR NEGATIVE 11/19/2006 1510   PROTEINUR >300 (A) 11/19/2006 1510   UROBILINOGEN 0.2 11/19/2006 1510   NITRITE NEGATIVE 11/19/2006 1510   LEUKOCYTESUR NEGATIVE 11/19/2006 1510   Urine Drug Screen     Component Value Date/Time   LABOPIA NONE DETECTED 07/30/2022 1231   COCAINSCRNUR NONE DETECTED 07/30/2022 1231   LABBENZ NONE DETECTED 07/30/2022 1231   AMPHETMU NONE DETECTED 07/30/2022 1231   THCU NONE DETECTED 07/30/2022 1231   LABBARB NONE DETECTED 07/30/2022 1231    Alcohol Level    Component Value Date/Time    ETH <10 07/25/2022 0718     SIGNIFICANT DIAGNOSTIC STUDIES EP PPM/ICD IMPLANT  Result Date: 07/31/2022 SURGEON:  Will Jorja Loa, MD   PREPROCEDURE DIAGNOSIS:  Cryptogenic stroke   POSTPROCEDURE DIAGNOSIS: Cryptogenic stroke    PROCEDURES:  1. Implantable loop recorder implantation   INTRODUCTION:  Dereon Ryel presents with a history of cryptogenic stroke The costs of loop recorder monitoring have been discussed with the patient. Appropriate time out was performed prior to the procedure.   DESCRIPTION OF PROCEDURE:  Informed written consent was obtained.  The patient required no sedation for the procedure today.  Mapping over the patient's chest was performed to identify the area where electrograms were most prominent for ILR recording.  This area was found to be the left parasternal region over the 4th intercostal space. The patients left chest was therefore prepped and draped in the usual sterile fashion. The skin overlying the left parasternal region was infiltrated with lidocaine for local analgesia.  A 0.5-cm incision was made over the left parasternal region over the 3rd intercostal space.  A subcutaneous ILR pocket was fashioned using a combination of sharp and blunt dissection.  A Med[employer]onic Reveal LINQ (serial # [id number]) implantable loop recorder was then placed into the pocket  R waves were very prominent and measured 0.79mV.  Steri- Strips and a sterile dressing were then applied.  There were no early apparent complications.   CONCLUSIONS:  1. Successful implantation of a implantable loop recorder for a history of cryptogenic stroke  2. No early apparent complications. Will Jorja Loa, MD 07/28/2022 5:00 PM   ECHO TEE  Result Date: 07/28/2022    TRANSESOPHOGEAL ECHO REPORT   Patient Name:   [facility] Date of Exam: 07/28/2022 Medical Rec #:  [id number]      Height:       69.0 in Accession #:    [id number]     Weight:       254.6 lb Date of Birth:  November 29, 1975      BSA:          2.289 m  Patient Age:    46 years       BP:           141/81 mmHg Patient Gender: M              HR:           109 bpm. Exam Location:  Inpatient Procedure: Cardiac Doppler, Color Doppler, Saline Contrast Bubble Study and            Transesophageal Echo  Indications:    Stroke I63.9  History:        Patient has prior history of Echocardiogram examinations, most                 recent 07/25/2022. Stroke.  Sonographer:    Dondra Prader RVT RCS Referring Phys: [id number] Perlie Gold PROCEDURE: TEE procedure time was 30 minutes. The transesophogeal probe was passed without difficulty through the esophogus of the patient. Imaged were obtained with the patient in a left lateral decubitus position. Local oropharyngeal anesthetic was provided with Cetacaine. Sedation performed by different physician. The patient was monitored while under deep sedation. Anesthestetic sedation was provided intravenously by Anesthesiology: 559mg  of Propofol. Image quality was good. The patient's vital signs; including heart rate, blood pressure, and oxygen saturation; remained stable throughout the procedure. The patient developed no complications during the procedure.  IMPRESSIONS  1. Left ventricular ejection fraction, by estimation, is 70 to 75%. The left ventricle has hyperdynamic function. There is severe left ventricular hypertrophy.  2. Right ventricular systolic function is normal. The right ventricular size is normal.  3. No left atrial/left atrial appendage thrombus was detected. The LAA emptying velocity was 73 cm/s.  4. The mitral valve is normal in structure. Trivial mitral valve regurgitation. No evidence of mitral stenosis.  5. The aortic valve is tricuspid. Aortic valve regurgitation is not visualized. No aortic stenosis is present.  6. Agitated saline contrast bubble study was negative, with no evidence of any interatrial shunt. Conclusion(s)/Recommendation(s): No LA/LAA thrombus identified. Negative bubble study for interatrial shunt. No  intracardiac source of embolism detected on this on this transesophageal echocardiogram. FINDINGS  Left Ventricle: Left ventricular ejection fraction, by estimation, is 70 to 75%. The left ventricle has hyperdynamic function. The left ventricular internal cavity size was normal in size. There is severe left ventricular hypertrophy. Right Ventricle: The right ventricular size is normal. No increase in right ventricular wall thickness. Right ventricular systolic function is normal. Left Atrium: Left atrial size was normal in size. No left atrial/left atrial appendage thrombus was detected. The LAA emptying velocity was 73 cm/s. Right Atrium: Right atrial size was normal in size. Pericardium: Trivial pericardial effusion is present. Mitral Valve: The mitral valve is normal in structure. Trivial mitral valve regurgitation. No evidence of mitral valve stenosis. Tricuspid Valve: The tricuspid valve is normal in structure. Tricuspid valve regurgitation is trivial. No evidence of tricuspid stenosis. Aortic Valve: The aortic valve is tricuspid. Aortic valve regurgitation is not visualized. No aortic stenosis is present. Pulmonic Valve: The pulmonic valve was normal in structure. Pulmonic valve regurgitation is trivial. Aorta: The aortic root and ascending aorta are structurally normal, with no evidence of dilitation. IAS/Shunts: No atrial level shunt detected by color flow Doppler. Agitated saline contrast was given intravenously to evaluate for intracardiac shunting. Agitated saline contrast bubble study was negative, with no evidence of any interatrial shunt. Additional Comments: Spectral Doppler performed. LEFT VENTRICLE PLAX 2D LVOT diam:     2.20 cm LV SV:         78 LV SV Index:   34 LVOT Area:     3.80 cm  AORTIC VALVE LVOT Vmax:   136.00 cm/s LVOT Vmean:  94.600 cm/s LVOT VTI:    0.205 m  AORTA Ao Root diam: 3.50 cm Ao Asc diam:  3.20 cm Ao Arch diam: 3.6 cm  SHUNTS Systemic VTI:  0.20 m Systemic Diam: 2.20 cm  Weston Brass MD Electronically signed by Weston Brass MD Signature Date/Time:  07/28/2022/9:58:52 AM    Final    EP STUDY  Result Date: 07/28/2022 See surgical note for result.  IR PERCUTANEOUS ART THROMBECTOMY/INFUSION INTRACRANIAL INC DIAG ANGIO  Result Date: 07/27/2022 INDICATION: Acute onset of left gaze deviation, right-sided hemiplegia and aphasia. Occluded left anterior cerebral artery A2 segment on CT angiogram of the head and neck. EXAM: 1. EMERGENT LARGE VESSEL OCCLUSION THROMBOLYSIS (anterior CIRCULATION) COMPARISON:  CT angiogram of the head and neck. MEDICATIONS: Ancef 2 g IV antibiotic was administered within 1 hour of the procedure. ANESTHESIA/SEDATION: General anesthesia. CONTRAST:  Omnipaque 300 approximately 80 mL. FLUOROSCOPY TIME:  Fluoroscopy Time: 53 minutes 0 seconds (2976 mGy). COMPLICATIONS: None immediate. TECHNIQUE: Following a full explanation of the procedure along with the potential associated complications, an informed witnessed consent was obtained. The risks of intracranial hemorrhage of 10%, worsening neurological deficit, ventilator dependency, death and inability to revascularize were all reviewed in detail with the patient's wife. The patient was then put under general anesthesia by the Department of Anesthesiology at [facility]. The right groin was prepped and draped in the usual sterile fashion. Thereafter using modified Seldinger technique, transfemoral access into the right common femoral artery was obtained without difficulty. Over an 0.035 inch guidewire an 8 French 25 cm Pinnacle sheath was inserted. Through this, and also over an 0.035 inch guidewire a combination of a 125 cm 5.5 French Simmons support catheter inside of an 087 95 cm balloon guide catheter was advanced to the aortic arch region and selectively positioned in the left common carotid artery. Access into the left common carotid artery was then obtained with the guidewire leading initially  into the left common carotid artery and then the left internal carotid artery mid cervical segment. The guidewire, and the support catheter were removed. Control arteriograms were then performed centered at the left common carotid bifurcation and the left internal carotid artery. FINDINGS: The left common carotid arteriogram demonstrates the left external carotid artery and its major branches to be widely patent. The left internal carotid artery opacifies to the cranial skull base. The petrous, the cavernous and the supraclinoid segments demonstrate wide patency. The left middle cerebral artery opacifies into the capillary and venous phases. The left anterior cerebral artery A2 segment demonstrates complete angiographic occlusion. Prompt cross-filling via the anterior communicating artery of the right anterior cerebral artery A2 segment and distally is noted. PROCEDURE: Through the balloon guide catheter in the distal left internal carotid artery, a 054 155 cm FreeClimb aspiration catheter with the Tenzing was advanced over an 014 inch micro guidewire to the supraclinoid left ICA. The micro guidewire was then selectively positioned in the left anterior cerebral artery followed by the advancement of the FreeClimb 54 with its Tenzing to the left A2 segment. The micro guidewire was then gently advanced through the occluded left ACA A2 segment into the A3 region followed by the advancement of the FreeClimb at the site of the occlusion. At this time the micro guidewire, and the Tenzing were removed. Slight forward advancement of the FreeClimb was evident. Continued aspiration was then applied via an aspiration pump at the hub of the FreeClimb aspiration catheter for approximately 2 minutes. With proximal flow arrest in the left internal carotid artery, the FreeClimb catheter was removed. A control arteriogram performed following reversal of flow arrest demonstrated now revascularized left anterior cerebral A2 segment and  distally achieving a TICI 3 revascularization. Also noted at this time was smooth filling defect extending into the frontoparietal branch. This was  washed for approximately 5 minutes. During this time a control arteriogram performed demonstrated progressed worsening of the stenosis at the previously aspiration site. It was felt this represented rebound from intracranial arteriosclerotic plaque. Through the [facility] catheter and the proximal A1 segment, a combination of an 021 Trevo ProVue microcatheter was advanced over an 018 inch micro guidewire with a moderate J configuration. The wire was advanced without difficulty into the distal A3 region followed by the microcatheter. The guidewire was removed. Good aspiration obtained from the hub of the microcatheter, which was connected to continuous heparinized saline infusion. A 3 mm x 24 mm Neuroform Atlas stent was then prepped and purged in the Tuohy Borst. This was then advanced under fluoroscopic guidance to the distal portion of the 021 microcatheter. The entire system was then straightened. The distal marker on the stent was then maintained while the microcatheter was retrieved unsheathing the distal and then the proximal portion of the stent. The delivery micro guidewire was then gently retrieved and removed. A control arteriogram performed through the FreeClimb catheter in the supraclinoid left ICA demonstrated excellent apposition and flow through the stented segment. Control arteriograms were then performed at 10 and 20 minutes post stent placement. This demonstrated intra stent irregularity with slow flow probably representing platelet aggregation. The flow through the stent gradually improved following administration of 7.5 mg of intra arterial Integrilin, and 7.5 milligrams/kg of cangrelor IV bolus. Patient also received aspirin 81 mg, and Brilinta 90 mg via orogastric tube prior to stent placement. A final control arteriogram performed through the  balloon guide catheter in the left common carotid artery demonstrates wide patency of the left internal carotid artery extra cranially and intracranially, and also of the left middle cerebral artery distribution. The left anterior cerebral artery demonstrates opacification with improved patency within the stent again maintaining a TICI 3 revascularization. An 8 French Angio-Seal closure device was deployed for hemostasis at the right groin puncture site. Distal pulses remained Dopplerable unchanged bilaterally. Patient was left intubated due to his medical condition, and also to protect his airway. He was then transferred to the neuro ICU for post revascularization care. IMPRESSION: Status post revascularization of occluded left anterior cerebral A2 segment with 1 pass with 054 contact aspiration catheter achieving a TICI 3 revascularization. Status post placement of a rescue stent for progressively worsening stenosis secondary to underlying intracranial arteriosclerosis. PLAN: Follow-up in the clinic 4 weeks post discharge. Electronically Signed   By: Julieanne Cotton M.D.   On: 07/27/2022 08:53   DG Abd Portable 1V  Result Date: 07/26/2022 CLINICAL DATA:  Feeding tube placement EXAM: PORTABLE ABDOMEN - 1 VIEW COMPARISON:  None Available. FINDINGS: Feeding tube tip is in the mid to distal stomach. Nonobstructive bowel gas pattern. IMPRESSION: Feeding tube tip in the stomach. Electronically Signed   By: Charlett Nose M.D.   On: 07/26/2022 15:18   EEG adult  Result Date: 07/26/2022 Joseph Quest, MD     07/26/2022  9:52 AM Patient Name: Garl Krizek MRN: [id number] Epilepsy Attending: Charlsie Bright Referring Physician/Provider: Gordy Councilman, MD Date: 07/26/2022 Duration: 23.52 mins Patient history: 47 yo M with brief focal seizure of RUE shaking followed by worsening aphasia for 1-2 min, self-resolved, exam otherwise returned to baseline. EEG to evaluate for seizure Level of alertness: Awake, asleep  AEDs during EEG study: LEV Technical aspects: This EEG study was done with scalp electrodes positioned according to the 10-20 International system of electrode placement. Electrical activity was reviewed with  band pass filter of 1-70Hz , sensitivity of 7 uV/mm, display speed of 74mm/sec with a 60Hz  notched filter applied as appropriate. EEG data were recorded continuously and digitally stored.  Video monitoring was available and reviewed as appropriate. Description: During awake state, no clear posterior dominant rhythm seen.  Sleep was characterized by sleep spindles (12 to 14 Hz), maximal frontocentral region.  EEG showed continuous generalized and lateralized left hemisphere 3-6 Hz theta- delta slowing.  Hyperventilation and photic stimulation were not performed.   ABNORMALITY -Continuous slow, generalized and lateralized left hemisphere IMPRESSION: This study is suggestive of cortical dysfunction arising from left hemisphere likely secondary to underlying structural abnormality.  Additionally there is moderate diffuse encephalopathy, nonspecific etiology.  No seizures or epileptiform discharges were seen throughout the recording. Please note lack of epileptiform abnormality during interictal EEG does not exclude the diagnosis of epilepsy. Joseph Bright   MR ANGIO HEAD WO CONTRAST  Result Date: 07/26/2022 CLINICAL DATA:  47 year old male code stroke presentation yesterday with left ACA A2 segment occlusion. Status post TNK. Status post endovascular revascularization of the left A2 segment, including rescue stenting. EXAM: MRA HEAD WITHOUT CONTRAST TECHNIQUE: Angiographic images of the Circle of Willis were acquired using MRA technique without intravenous contrast. COMPARISON:  MRI today.  CTA head and neck yesterday. FINDINGS: Anterior circulation: Antegrade flow in both ICA siphons. No siphon stenosis. Patent carotid termini. MCA and ACA origins remain normal. Bilateral MCA M1 segments, MCA bifurcations and  visible bilateral MCA branches appear stable and within normal limits. Both ACA A1 segments in the anterior communicating arteries are patent, within normal limits. There is an 8 mm segment of pronounced left ACA proximal A2 attenuation which is favored to be vascular stent artifact in this setting. There is maintained ACA antegrade flow signal distal to that segment. No major left ACA branch occlusion is identified. Contralateral right ACA branches are within normal limits. Posterior circulation: Antegrade flow in the posterior circulation. Vertebrobasilar tortuosity without stenosis redemonstrated. Mild to moderate irregularity and stenosis of the left PCA P2 segment is stable on series 455, image 7. Other PCA branches are within normal limits. Anatomic variants: None. Other: MRI today reported separately. IMPRESSION: 1. Left ACA proximal A2 segment is attenuated, but likely due to vascular stent artifact. Preserved distal left ACA flow signal. 2. Otherwise negative Anterior circulation. 3. Stable Left PCA P2 segment mild to moderate irregularity and stenosis. 4. MRI today reported separately. Electronically Signed   By: Joseph Bright M.D.   On: 07/26/2022 09:44   MR BRAIN WO CONTRAST  Result Date: 07/26/2022 CLINICAL DATA:  47 year old male code stroke presentation yesterday with left ACA A2 segment occlusion. Status post TNK. Status post endovascular revascularization of the left A2 segment. EXAM: MRI HEAD WITHOUT CONTRAST TECHNIQUE: Multiplanar, multiecho pulse sequences of the brain and surrounding structures were obtained without intravenous contrast. COMPARISON:  Head CT, CTA head and neck yesterday. FINDINGS: Brain: Widespread restricted diffusion in the left ACA territory, confluent along the left cingulate gyrus, superior frontal gyrus. See coronal DWI images. Distal ACA territory involvement also at the posterior MCA/ACA watershed. And there are superimposed scattered mostly punctate areas of restricted  diffusion elsewhere in the left MCA, left greater than right PCA, and occasionally also the right MCA (series 3, image 18) and cerebellar (image 12) vascular territories. Cytotoxic edema in the affected areas. No hemorrhagic transformation is identified. But also superimposed cystic encephalomalacia in the body of the corpus callosum (series 6, image 13). Chronic appearing right  greater than left thalamic lacunar infarcts (series 7, image 14). Basal ganglia relatively spared. No chronic cortical encephalomalacia is identified. Brainstem remains normal. Partially empty sella. No midline shift, mass effect, evidence of mass lesion, ventriculomegaly, extra-axial collection. Cervicomedullary junction within normal limits. Vascular: Major intracranial vascular flow voids are preserved. Intracranial MRA is reported separately today. Skull and upper cervical spine: Negative visible cervical spine. Visualized bone marrow signal is within normal limits. Sinuses/Orbits: Negative orbits. Paranasal sinuses and mastoids are stable and well aerated. Other: Visible internal auditory structures appear normal. IMPRESSION: 1. Confluent Left ACA territory infarct. Cytotoxic edema with no hemorrhagic transformation. 2. Multiple additional scattered mostly punctate ischemic foci in the left greater than right MCA, PCA, and occasionally cerebellar artery territories. No significant intracranial mass effect. 3. 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 4. See intracranial MRA is reported separately today. Electronically Signed   By: Joseph Bright M.D.   On: 07/26/2022 09:39   CT HEAD WO CONTRAST ( )  Result Date: 07/26/2022 CLINICAL DATA:  Stroke follow-up EXAM: CT HEAD WITHOUT CONTRAST TECHNIQUE: Contiguous axial images were obtained from the base of the skull through the vertex without intravenous contrast. RADIATION DOSE REDUCTION: This exam was performed according to the departmental  dose-optimization program which includes automated exposure control, adjustment of the mA and/or kV according to patient size and/or use of iterative reconstruction technique. COMPARISON:  None Available. FINDINGS: Motion degraded study. Brain: There is no mass, hemorrhage or extra-axial collection. The size and configuration of the ventricles and extra-axial CSF spaces are normal. 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 There is periventricular hypoattenuation compatible with chronic microvascular disease. Vascular: Anterior cerebral artery stent. Skull: The visualized skull base, calvarium and extracranial soft tissues are normal. Sinuses/Orbits: Mild maxillary sinus mucosal thickening. The orbits are normal. IMPRESSION: 1. Motion degraded study without acute intracranial abnormality. 2. 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 Electronically Signed   By: Joseph Bright M.D.   On: 07/26/2022 03:48   ECHOCARDIOGRAM COMPLETE  Result Date: 07/25/2022    ECHOCARDIOGRAM REPORT   Patient Name:   [facility] Date of Exam: 07/25/2022 Medical Rec #:  [id number]      Height:       69.0 in Accession #:    [id number]     Weight:       252.2 lb Date of Birth:  [birth year]      BSA:          2.280 m Patient Age:    46 years       BP:           104/70 mmHg Patient Gender: M              HR:           58 bpm. Exam Location:  Inpatient Procedure: 2D Echo, 3D Echo, Cardiac Doppler, Color Doppler and Strain Analysis Indications:    Stroke  History:        Patient has no prior history of Echocardiogram examinations.                 Risk Factors:Hypertension and Dyslipidemia.  Sonographer:    Sheralyn Boatman RDCS Referring Phys: [id number] ASHISH ARORA  Sonographer Comments: Patient is obese and echo performed with patient supine and on artificial respirator. Image acquisition challenging due to patient body habitus. IMPRESSIONS  1. Left ventricular ejection fraction, by estimation, is 70 to 75%. The left  ventricle has hyperdynamic  function. The left ventricle has no regional wall motion abnormalities. There is severe asymmetric left ventricular hypertrophy of the septal segment. Left ventricular diastolic parameters are consistent with Grade I diastolic dysfunction (impaired relaxation). The average left ventricular global longitudinal strain is -22.0 %. The global longitudinal strain is normal.  2. Right ventricular systolic function is normal. The right ventricular size is mildly enlarged.  3. The mitral valve is normal in structure. No evidence of mitral valve regurgitation. No evidence of mitral stenosis.  4. The aortic valve is tricuspid. Aortic valve regurgitation is not visualized.  5. The inferior vena cava is normal in size with greater than 50% respiratory variability, suggesting right atrial pressure of 3 mmHg. Comparison(s): No prior Echocardiogram. 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 FINDINGS  Left Ventricle: Greatest hypertrophy 18 mm Basal Septal on PSAX imaging. No resting systolic anterior motion of the mitral valve. Abnroaml basal septal strain with normal GLS. Left ventricular ejection fraction, by estimation, is 70 to 75%. The left ventricle has hyperdynamic function. The left ventricle has no regional wall motion abnormalities. The average left ventricular global longitudinal strain is -22.0 %. The global longitudinal strain is normal. The left ventricular internal cavity size was  normal in size. There is severe asymmetric left ventricular hypertrophy of the septal segment. Left ventricular diastolic parameters are consistent with Grade I diastolic dysfunction (impaired relaxation). Right Ventricle: The right ventricular size is mildly enlarged. No increase in right ventricular wall thickness. Right ventricular systolic function is normal. Left Atrium: Left  atrial size was normal in size. Right Atrium: Right atrial size was normal in size. Pericardium: There is no evidence of pericardial effusion. Mitral Valve: The mitral valve is normal in structure. No evidence of mitral valve regurgitation. No evidence of mitral valve stenosis. Tricuspid Valve: The tricuspid valve is normal in structure. Tricuspid valve regurgitation is not demonstrated. No evidence of tricuspid stenosis. Aortic Valve: The aortic valve is tricuspid. Aortic valve regurgitation is not visualized. Pulmonic Valve: The pulmonic valve was normal in structure. Pulmonic valve regurgitation is trivial. No evidence of pulmonic stenosis. Aorta: The aortic root and ascending aorta are structurally normal, with no evidence of dilitation. Venous: The inferior vena cava is normal in size with greater than 50% respiratory variability, suggesting right atrial pressure of 3 mmHg. IAS/Shunts: No atrial level shunt detected by color flow Doppler.  LEFT VENTRICLE PLAX 2D LVIDd:         4.00 cm   Diastology LVIDs:         1.70 cm   LV e' medial:    7.07 cm/s LV PW:         2.10 cm   LV E/e' medial:  10.8 LV IVS:        1.60 cm   LV e' lateral:   5.77 cm/s LVOT diam:     2.30 cm   LV E/e' lateral: 13.3 LV SV:         109 LV SV Index:   48        2D Longitudinal Strain LVOT Area:     4.15 cm  2D Strain GLS Avg:     -22.0 %  RIGHT VENTRICLE             IVC RV S prime:     16.10 cm/s  IVC diam: 1.50 cm TAPSE (M-mode): 2.3 cm LEFT ATRIUM  Index        RIGHT ATRIUM          Index LA diam:      3.50 cm 1.54 cm/m   RA Area:     8.70 cm LA Vol (A2C): 54.7 ml 23.99 ml/m  RA Volume:   16.60 ml 7.28 ml/m LA Vol (A4C): 17.5 ml 7.68 ml/m  AORTIC VALVE             PULMONIC VALVE LVOT Vmax:   134.00 cm/s PR End Diast Vel: 1.58 msec LVOT Vmean:  83.600 cm/s LVOT VTI:    0.263 m  AORTA Ao Root diam: 3.30 cm Ao Asc diam:  3.20 cm MITRAL VALVE MV Area (PHT): 3.12 cm    SHUNTS MV Decel Time: 243 msec    Systemic VTI:  0.26 m  MV E velocity: 76.50 cm/s  Systemic Diam: 2.30 cm MV A velocity: 80.40 cm/s MV E/A ratio:  0.95 Joseph Lam MD Electronically signed by Joseph Lam MD Signature Date/Time: 07/25/2022/3:49:04 PM    Final    CT ANGIO HEAD NECK W WO CM (CODE STROKE)  Result Date: 07/25/2022 CLINICAL DATA:  Code stroke. 47 year old male. Aphasia, right side weakness. EXAM: CT ANGIOGRAPHY HEAD AND NECK WITH AND WITHOUT CONTRAST TECHNIQUE: Multidetector CT imaging of the head and neck was performed using the standard protocol during bolus administration of intravenous contrast. Multiplanar CT image reconstructions and MIPs were obtained to evaluate the vascular anatomy. Carotid stenosis measurements (when applicable) are obtained utilizing NASCET criteria, using the distal internal carotid diameter as the denominator. RADIATION DOSE REDUCTION: This exam was performed according to the departmental dose-optimization program which includes automated exposure control, adjustment of the mA and/or kV according to patient size and/or use of iterative reconstruction technique. CONTRAST:  75mL OMNIPAQUE IOHEXOL 350 MG/ML SOLN COMPARISON:  Plain head CT 0720 hours today. FINDINGS: CTA NECK Skeleton: No acute osseous abnormality identified. Cervical spine degeneration chiefly C5-C6. Upper chest: Negative. Other neck: Negative. Aortic arch: Mildly tortuous arch. Bovine arch configuration. No arch atherosclerosis. Right carotid system: Negative. Left carotid system: Bovine left CCA origin without plaque or stenosis. No cervical left carotid plaque or stenosis. Vertebral arteries: Negative, and fairly codominant cervical vertebral arteries. CTA HEAD Posterior circulation: Mild V4 segment calcified plaque on the left. Codominant distal vertebral arteries and vertebrobasilar junction are patent without stenosis. Normal left PICA origin. Right AICA appears dominant. Patent basilar artery without stenosis. Basilar tip, SCA and PCA  origins appear normal. Posterior communicating arteries are diminutive or absent. Right PCA branches are within normal limits. There is moderate left P2 segment irregularity and stenosis on series 12, image 23 distal left PCA branches remain patent. Anterior circulation: Patent ICA siphons with no plaque or stenosis. Patent carotid termini, MCA and ACA origins. Bilateral MCA branches are patent and within normal limits. ACA A1 segments and small (fenestrated) anterior communicating arteries are within normal limits. The left ACA A2 segment is occluded on series 7, image 97 about 13 mm from its origin. See also series 12, image 19. No distal reconstitution identified. Right ACA branches remain patent. There is a mild distal right ACA (84) stenosis also on image 19. Venous sinuses: Early contrast timing, not evaluated. Anatomic variants: None significant. Review of the MIP images confirms the above findings Left ACA finding discussed by telephone with Dr. Wilford Corner on 07/25/2022 at 07:32 . IMPRESSION: 1. 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 No distal  reconstitution. No other ELVO. This was discussed by telephone with Dr. Wilford Corner on 07/25/2022 at 07:32. 2. Very little atherosclerosis identified in the head or neck. But there is Moderate stenosis of the Left PCA P2 segment. And mild right ACA A4 stenosis. Electronically Signed   By: Joseph Bright M.D.   On: 07/25/2022 07:42   CT HEAD CODE STROKE WO CONTRAST  Result Date: 07/25/2022 CLINICAL DATA:  Code stroke. 47 year old male. Aphasia, right side weakness. EXAM: CT HEAD WITHOUT CONTRAST TECHNIQUE: Contiguous axial images were obtained from the base of the skull through the vertex without intravenous contrast. RADIATION DOSE REDUCTION: This exam was performed according to the departmental dose-optimization program which includes automated exposure control, adjustment of the mA and/or kV according to patient size and/or use of  iterative reconstruction technique. COMPARISON:  Head CT 11/19/2006. FINDINGS: Brain: New since 2008 but chronic appearing, circumscribed lacunar infarct lateral right thalamus series 2, image 18. Increased mild additional patchy white matter opacity such as in the left hemisphere series 2, image 24. No midline shift, ventriculomegaly, mass effect, evidence of mass lesion, intracranial hemorrhage or evidence of cortically based acute infarction. Chronic partially empty sella. Vascular: Mild Calcified atherosclerosis at the skull base. Skull: No acute osseous abnormality identified. Sinuses/Orbits: Right maxillary molar dental disease with periapical cyst extending into the alveolar recess of that sinus. Mild bilateral maxillary mucosal thickening. Other visualized paranasal sinuses and mastoids are stable and well aerated. Other: Visualized orbits and scalp soft tissues are within normal limits. ASPECTS [facility] Stroke Program Early CT Score) Total score (0-10 with 10 being normal): 10 IMPRESSION: 1. No acute cortically based infarct or acute intracranial hemorrhage identified. ASPECTS 10. 2. New but chronic appearing right thalamic lacune since 2008. Some increased white matter disease since that time. 3. These results were communicated to Dr. Wilford Corner at 7:27 am on 07/25/2022 by text page via the [facility] messaging system. Electronically Signed   By: Joseph Bright M.D.   On: 07/25/2022 07:28       HISTORY OF PRESENT ILLNESS Patient with a history of hypertension and back pain presented with sudden onset aphasia and right-sided weakness.  HOSPITAL COURSE Patient was given TNK to treat his stroke, and CTA showed left ACA occlusion, he was then taken to IR and Tiki 3 flow was achieved in left ACA.  Unfortunately, this area was reoccluded and rescue stenting was performed, but this reoccluded as well.  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 Small vessel disease. ASPECTS 10.    CTA head & neck acute occlusion of left ACA A2 segment IR - left A2 occlusion with TICI3 and reocclusion s/p stenting MRI confluent left ACA territory infarct with multiple scattered punctate ischemic foci in right MCA PCA and cerebellar artery territories MRA attenuated left ACA proximal segment, likely due to vascular stent artifact 2D Echo EF 70 to 75% TEE unremarkable, no PFO, no cardioembolic source S/p loop recorder 07/28/22 LDL 74, TG 313 HgbA1c 5.6 VTE prophylaxis -SQH No antithrombotic prior to admission, now on aspirin 81 mg daily and Brilinta (ticagrelor) 90 mg bid due to stent placement Therapy recommendations: CIR   Hypertension Home meds: Amlodipine 10 mg daily, hydralazine 50 mg twice daily, labetalol 200 mg twice daily Stable now Keep systolic blood pressure less  than 180 Long-term BP goal normotensive   Hyperlipidemia Home meds: Simvastatin 20 mg daily, not compliant LDL 74, goal < 70 Now on Crestor 20 Continue statin at discharge   Seizure like activity Patient had focal seizure overnight consisting of right upper extremity shaking followed by worsening aphasia for about 2 minutes EEG shows no seizures or epileptiform discharges Keppra reduced to 500 mg XR daily given renal function   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-> 2.79-> 2.96-> 3.01-> 3.05 Baseline CKD Continue to follow-up with outpatient nephrology, patient already is established with [facility]   Dysphagia Was on tube feeding per core track Also on full liquid diet per speech therapist 5/2 Core track removed during TEE procedure Passed bedside swallow screen 5/3 Put on dys1 and thin liquid Speech following   Other Stroke Risk Factors Former cigarette smoker Obesity, Body mass index is 38.32  kg/m., BMI >/= 30 associated with increased stroke risk, recommend weight loss, diet and exercise as appropriate  Obstructive sleep apnea, not on CPAP at home   Hyperkalemia Potassium 5.2 on 5/4 in the setting of elevated creatinine Potassium 5.4 on 5/6, 5 g of Lokelma given Potassium 5.8 on 5/7, no EKG changes noted, 10 g of Lokelma given and will follow-up with potassium this afternoon BMP in the morning   Other Active Problems Leukocytosis WBC 14.8-> 8.7-> 8.0-> 7.9 -> stable  DISCHARGE EXAM Blood pressure (!) 156/111, pulse 81, temperature 98.6 F (37 C), temperature source Oral, resp. rate 20, height 5\' 9"  (1.753 m), weight 117.7 kg, SpO2 99 %.  General: Drowsy, well-nourished, well-developed patient in no acute distress Respiratory: Regular, unlabored respirations   NEURO:  Mental Status: AA&Ox3  Speech/Language: Speech is in single words and short phrases with some word finding difficulties.  He is able to repeat phrases and name objects.  H   Cranial Nerves:  II: PERRL.  III, IV, VI: EOMI. Eyelids elevate symmetrically.  V: Sensation is intact to light touch and symmetrical to face.  VII: Slight right facial droop VIII: hearing intact to voice. IX, X: Phonation is normal.  OZ:[id number] shrug 5/5. XII: tongue is midline without fasciculations. Motor: 5/5 strength to left upper and lower extremities, no movement of right upper and lower extremities Tone: is normal and bulk is normal on left side, right arm is flaccid Sensation- Intact to light touch bilaterally.  Discharge Diet      Diet   Diet regular Room service appropriate? Yes with Assist; Fluid consistency: Thin   liquids  DISCHARGE PLAN Disposition:  Transfer to [facility] for ongoing PT, OT and ST aspirin 81 mg daily and Brilinta (ticagrelor) 90 mg bid for secondary stroke prevention for 3 months and then per IR Recommend ongoing stroke risk factor control by Primary Care Physician at time of  discharge from inpatient rehabilitation. Follow-up PCP Joseph Blamer, MD in 2 weeks following discharge from rehab. Follow-up in [facility] in 8 weeks following discharge from rehab, office to schedule an appointment.   33 minutes were spent preparing discharge.  Cortney E Ernestina Columbia , MSN, AGACNP-BC [facility] See Amion for schedule and pager information 08/02/2022 10:43 AM   .Frederica Kuster

## 2022-08-02 NOTE — H&P (Signed)
Physical Medicine and Rehabilitation Admission H&P        Chief Complaint  Patient presents with   Code Stroke  : HPI: Joseph Bright is a 47 year old right-handed male with history of hypertension, CKD stage IV followed at outpatient nephrology, as well as medical noncompliance, quit smoking 22 years ago.  Per chart review patient lives with spouse-year-old daughter.  1 level home multiple steps to entry.  Independent prior to admission.  Presented 07/25/2022 with acute onset of right-sided weakness as well as speech difficulty.  Cranial CT scan negative for acute changes.  There was chronic appearing right thalamic lacunar infarct.  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  Status post TNK with interventional radiology for stenting.  MRI follow-up showed confluent left ACA territory infarction.  Cytotoxic edema with no hemorrhagic transformation.  Multiple additional scattered mostly punctate ischemic foci in the left greater than right MCA, PCA and occasionally cerebellar artery territories.  No significant mass effect.  Echocardiogram with ejection fraction of 70 to 75% grade 1 diastolic dysfunction.  Patient with reported seizure-like activity.  EEG negative for seizure.  Patient was maintained on Keppra for seizure prophylaxis.  TEE unremarkable no PFO, no cardioembolic source.  Neurology follow-up maintained on aspirin as well as Brilinta 90 mg twice daily for CVA prophylaxis.  Underwent loop recorder placement 07/28/2022.  Subcutaneous heparin added for DVT prophylaxis.  Initially on tube feeds for nutritional support her diet has been advanced to regular.  Bouts of hyperkalemia patient did require Lokelma with latest potassium 5.8 and follow-up chemistries pending.  Therapy evaluations completed due to patient's decreased functional mobility and right-sided weakness was admitted for a comprehensive rehab program.   Review of Systems  Constitutional:  Negative  for chills and fever.  HENT:  Negative for hearing loss.   Eyes:  Negative for blurred vision and double vision.  Respiratory:  Negative for cough, shortness of breath and wheezing.   Cardiovascular:  Negative for chest pain, palpitations and leg swelling.  Gastrointestinal:  Positive for constipation. Negative for heartburn, nausea and vomiting.  Genitourinary:  Negative for dysuria, flank pain and hematuria.  Musculoskeletal:  Positive for myalgias.  Skin:  Negative for rash.  Neurological:  Positive for speech change and weakness.  All other systems reviewed and are negative.       Past Medical History:  Diagnosis Date   Hypertension     Stroke [facility])           Past Surgical History:  Procedure Laterality Date   IR PERCUTANEOUS ART THROMBECTOMY/INFUSION INTRACRANIAL INC DIAG ANGIO   07/25/2022   LOOP RECORDER INSERTION N/A 07/28/2022    Procedure: LOOP RECORDER INSERTION;  Surgeon: Regan Lemming, MD;  Location: [facility] INVASIVE CV LAB;  Service: Cardiovascular;  Laterality: N/A;   RADIOLOGY WITH ANESTHESIA N/A 07/25/2022    Procedure: RADIOLOGY WITH ANESTHESIA;  Surgeon: Radiologist, Medication, MD;  Location: [facility] OR;  Service: Radiology;  Laterality: N/A;   TEE WITHOUT CARDIOVERSION N/A 07/28/2022    Procedure: TRANSESOPHAGEAL ECHOCARDIOGRAM;  Surgeon: Parke Poisson, MD;  Location: [facility] INVASIVE CV LAB;  Service: Cardiovascular;  Laterality: N/A;    History reviewed. No pertinent family history. Social History:  reports that he quit smoking about 22 years ago. His smoking use included cigarettes. He started smoking about 27 years ago. He has never used smokeless tobacco. He reports current alcohol use. He reports that he does not use drugs. Allergies: No Known Allergies  Medications Prior to Admission  Medication Sig Dispense Refill   amLODipine (NORVASC) 10 MG tablet Take 10 mg by mouth daily.       hydrALAZINE (APRESOLINE) 50 MG tablet Take 50 mg by mouth 2 (two) times  daily.       isosorbide mononitrate (IMDUR) 60 MG 24 hr tablet Take 60 mg by mouth daily.       labetalol (NORMODYNE) 200 MG tablet Take 200 mg by mouth 2 (two) times daily.       simvastatin (ZOCOR) 20 MG tablet Take 20 mg by mouth daily.       traMADol (ULTRAM) 50 MG tablet TAKE 1 TO 2 TABLETS(50 TO 100 MG) BY MOUTH AT BEDTIME AS NEEDED (Patient not taking: Reported on 07/25/2022) 20 tablet 0          Home: Home Living Family/patient expects to be discharged to:: Private residence Living Arrangements: Spouse/significant other Available Help at Discharge: Available 24 hours/day (wife works from home) Type of Home: Apartment Home Access: Stairs to enter [employer] of Steps: 15; 2nd floor apartment Entrance Stairs-Rails: Left, Right Home Layout: One level Bathroom Shower/Tub: Associate Professor: Yes Home Equipment: None  Lives With: Spouse   Functional History: Prior Function Prior Level of Function : Independent/Modified Independent, Driving   Functional Status:  Mobility: Bed Mobility Overal bed mobility: Needs Assistance Bed Mobility: Sidelying to Sit, Sit to Sidelying Rolling: Mod assist Sidelying to sit: Max assist Supine to sit: Mod assist, HOB elevated Sit to supine: Mod assist, HOB elevated Sit to sidelying: Mod assist General bed mobility comments: verbal and tactile cues, good initiation with LUE and LLE, but assist to move RLE out of bed and to elevate trunk. once seated, pt with good balance Transfers Overall transfer level: Needs assistance Equipment used: Ambulation equipment used Transfers: Sit to/from Stand Sit to Stand: Min assist, From elevated surface, Mod assist Bed to/from chair/wheelchair/BSC transfer type:: Via Lift equipment Stand pivot transfers: Total assist Squat pivot transfers: Max assist, +2 physical assistance Transfer via Lift Equipment: Stedy General transfer comment: minAx2 for  sit to stand up into stedy with bed elevated and use of L UE, however maxA at R hip and knee to maintain equal weight bearing vs L lateral bias and R knee in extension vs flexion, maxA to keep R hand on stedy, worked on sit to stands from seat of stedy without using L UE to pull up and maxA to keep weight shifted to R side to promote R LE and UE strengthening, 10 sit to stands without L UE use completed, tech to aide in minimize using L UE Ambulation/Gait General Gait Details: unable Pre-gait activities: standing wt shift in stedy with attempt to facilitate R knee extension/quad activation   ADL: ADL Overall ADL's : Needs assistance/impaired Grooming: Wash/dry face, Oral care, Minimal assistance, Sitting Grooming Details (indicate cue type and reason): in recliner with assistance for toothpaste and instructions on one handed techniques Toilet Transfer: Maximal assistance, +2 for physical assistance, Tax adviser Details (indicate cue type and reason): simulated to the recliner Toileting- Clothing Manipulation and Hygiene: Sit to/from stand, Maximal assistance Toileting - Clothing Manipulation Details (indicate cue type and reason): simulated standing Functional mobility during ADLs: Maximal assistance, +2 for safety/equipment (sit to stand and for squat pivot transfer) General ADL Comments: focused on sitting balance on EOB   Cognition: Cognition Overall Cognitive Status: Difficult to assess Arousal/Alertness: Awake/alert Orientation Level: Oriented to person, Oriented to place,  Disoriented to time, Disoriented to situation Attention: Sustained Sustained Attention: Appears intact Cognition Arousal/Alertness: Awake/alert Behavior During Therapy: Flat affect Overall Cognitive Status: Difficult to assess Area of Impairment: Attention, Following commands, Awareness, Problem solving, Safety/judgement Current Attention Level: Sustained Following Commands: Follows one step  commands consistently, Follows one step commands with increased time Safety/Judgement: Decreased awareness of deficits, Decreased awareness of safety (R inattention) Problem Solving: Slow processing, Decreased initiation, Difficulty sequencing, Requires verbal cues, Requires tactile cues General Comments: pt with noted word finding difficulty Difficult to assess due to: Impaired communication   Physical Exam: Blood pressure (!) 145/111, pulse 94, temperature 98.4 F (36.9 C), temperature source Oral, resp. rate 20, height 5\' 9"  (1.753 m), weight 117.7 kg, SpO2 98 %. Physical Exam HENT:     Head: Normocephalic.     Nose: Nose normal.  Eyes:     Pupils: Pupils are equal, round, and reactive to light.  Cardiovascular:     Rate and Rhythm: Normal rate and regular rhythm.  Pulmonary:     Effort: Pulmonary effort is normal. No respiratory distress.     Breath sounds: No wheezing.  Abdominal:     General: There is no distension.     Palpations: Abdomen is soft.  Musculoskeletal:        General: Normal range of motion.     Cervical back: Normal range of motion.  Skin:    General: Skin is warm.  Neurological:     Mental Status: He is alert.     Comments: Patient is alert.  Makes eye contact with examiner.  Speaks in single words with some word finding difficulties. Right central 7 and tongue deviation. RUE and RLE 0/5. Moves LUE/LLE 5/5. Senses LT in all 4 limbs. No abnl resting tone.   Psychiatric:     Comments: Flat.         Lab Results Last 48 Hours        Results for orders placed or performed during the hospital encounter of 07/25/22 (from the past 48 hour(s))  Glucose, capillary     Status: Abnormal    Collection Time: 07/30/22  7:46 PM  Result Value Ref Range    Glucose-Capillary 110 (H) 70 - 99 mg/dL      Comment: Glucose reference range applies only to samples taken after fasting for at least 8 hours.    Comment 1 Notify RN    Glucose, capillary     Status: None     Collection Time: 07/31/22  3:19 AM  Result Value Ref Range    Glucose-Capillary 86 70 - 99 mg/dL      Comment: Glucose reference range applies only to samples taken after fasting for at least 8 hours.  Basic metabolic panel     Status: Abnormal    Collection Time: 07/31/22  3:42 AM  Result Value Ref Range    Sodium 138 135 - 145 mmol/L    Potassium 5.4 (H) 3.5 - 5.1 mmol/L    Chloride 108 98 - 111 mmol/L    CO2 20 (L) 22 - 32 mmol/L    Glucose, Bld 104 (H) 70 - 99 mg/dL      Comment: Glucose reference range applies only to samples taken after fasting for at least 8 hours.    BUN 62 (H) 6 - 20 mg/dL    Creatinine, Ser 4.09 (H) 0.61 - 1.24 mg/dL    Calcium 9.2 8.9 - 81.1 mg/dL    GFR, Estimated 25 (L) >60 mL/min  Comment: (NOTE) Calculated using the CKD-EPI Creatinine Equation (2021)      Anion gap 10 5 - 15      Comment: Performed at [facility], [address], [place], [place] [address]  CBC     Status: None    Collection Time: 07/31/22  3:42 AM  Result Value Ref Range    WBC 9.4 4.0 - 10.5 K/uL    RBC 4.69 4.22 - 5.81 MIL/uL    Hemoglobin 14.6 13.0 - 17.0 g/dL    HCT 21.3 08.6 - 57.8 %    MCV 91.9 80.0 - 100.0 fL    MCH 31.1 26.0 - 34.0 pg    MCHC 33.9 30.0 - 36.0 g/dL    RDW 46.9 62.9 - 52.8 %    Platelets 232 150 - 400 K/uL    nRBC 0.0 0.0 - 0.2 %      Comment: Performed at [facility], [address], [place], [place] [address]  Glucose, capillary     Status: None    Collection Time: 07/31/22  8:01 AM  Result Value Ref Range    Glucose-Capillary 98 70 - 99 mg/dL      Comment: Glucose reference range applies only to samples taken after fasting for at least 8 hours.    Comment 1 Notify RN      Comment 2 Document in Chart    Glucose, capillary     Status: None    Collection Time: 07/31/22 11:31 AM  Result Value Ref Range    Glucose-Capillary 93 70 - 99 mg/dL      Comment: Glucose reference range applies only to samples taken after fasting for at  least 8 hours.    Comment 1 Notify RN      Comment 2 Document in Chart    Glucose, capillary     Status: None    Collection Time: 07/31/22  3:52 PM  Result Value Ref Range    Glucose-Capillary 99 70 - 99 mg/dL      Comment: Glucose reference range applies only to samples taken after fasting for at least 8 hours.    Comment 1 Notify RN      Comment 2 Document in Chart    Glucose, capillary     Status: Abnormal    Collection Time: 07/31/22  8:14 PM  Result Value Ref Range    Glucose-Capillary 105 (H) 70 - 99 mg/dL      Comment: Glucose reference range applies only to samples taken after fasting for at least 8 hours.    Comment 1 Notify RN      Comment 2 Document in Chart    Glucose, capillary     Status: None    Collection Time: 07/31/22 11:36 PM  Result Value Ref Range    Glucose-Capillary 93 70 - 99 mg/dL      Comment: Glucose reference range applies only to samples taken after fasting for at least 8 hours.    Comment 1 Notify RN      Comment 2 Document in Chart    Glucose, capillary     Status: None    Collection Time: 08/01/22  3:56 AM  Result Value Ref Range    Glucose-Capillary 98 70 - 99 mg/dL      Comment: Glucose reference range applies only to samples taken after fasting for at least 8 hours.    Comment 1 Notify RN      Comment 2 Document in Chart  Triglycerides     Status: Abnormal    Collection Time: 08/01/22  6:20 AM  Result Value Ref Range    Triglycerides 193 (H) <150 mg/dL      Comment: Performed at [facility], [address] [place], [place] [address]  CBC     Status: None    Collection Time: 08/01/22  6:20 AM  Result Value Ref Range    WBC 8.2 4.0 - 10.5 K/uL    RBC 4.74 4.22 - 5.81 MIL/uL    Hemoglobin 14.7 13.0 - 17.0 g/dL    HCT 29.5 28.4 - 13.2 %    MCV 93.9 80.0 - 100.0 fL    MCH 31.0 26.0 - 34.0 pg    MCHC 33.0 30.0 - 36.0 g/dL    RDW 44.0 10.2 - 72.5 %    Platelets 229 150 - 400 K/uL    nRBC 0.0 0.0 - 0.2 %      Comment: Performed at  [facility], [address], [place], [place] [address]  Basic metabolic panel     Status: Abnormal    Collection Time: 08/01/22  6:20 AM  Result Value Ref Range    Sodium 132 (L) 135 - 145 mmol/L    Potassium 5.8 (H) 3.5 - 5.1 mmol/L    Chloride 103 98 - 111 mmol/L    CO2 18 (L) 22 - 32 mmol/L    Glucose, Bld 95 70 - 99 mg/dL      Comment: Glucose reference range applies only to samples taken after fasting for at least 8 hours.    BUN 68 (H) 6 - 20 mg/dL    Creatinine, Ser 0.34 (H) 0.61 - 1.24 mg/dL    Calcium 9.1 8.9 - 74.2 mg/dL    GFR, Estimated 25 (L) >60 mL/min      Comment: (NOTE) Calculated using the CKD-EPI Creatinine Equation (2021)      Anion gap 11 5 - 15      Comment: Performed at [facility], [address], [place], [place] [address]  Glucose, capillary     Status: None    Collection Time: 08/01/22  7:31 AM  Result Value Ref Range    Glucose-Capillary 97 70 - 99 mg/dL      Comment: Glucose reference range applies only to samples taken after fasting for at least 8 hours.  Glucose, capillary     Status: Abnormal    Collection Time: 08/01/22 11:34 AM  Result Value Ref Range    Glucose-Capillary 138 (H) 70 - 99 mg/dL      Comment: Glucose reference range applies only to samples taken after fasting for at least 8 hours.    Comment 1 Notify RN      Comment 2 Document in Chart        Imaging Results (Last 48 hours)  No results found.         Blood pressure (!) 145/111, pulse 94, temperature 98.4 F (36.9 C), temperature source Oral, resp. rate 20, height 5\' 9"  (1.753 m), weight 117.7 kg, SpO2 98 %.   Medical Problem List and Plan: 1. 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  Antithrombotics: -DVT/anticoagulation:  Pharmaceutical: Heparin             -antiplatelet therapy: Aspirin  81 mg daily and Brilinta 90 mg twice daily 3. Pain Management: Tylenol as needed 4. Mood/Behavior/Sleep: Provide emotional support             -antipsychotic agents: N/A 5. Neuropsych/cognition: This patient is not capable of making decisions on his own behalf. 6. Skin/Wound Care: Routine skin checks 7. Fluids/Electrolytes/Nutrition: Routine in and outs with follow-up chemistries 8.  Seizure prophylaxis.  EEG negative.  Continue Keppra 500 mg daily 9.  AKI on CKD stage IV.  Follow-up chemistries.  Follow-up outpatient nephrology services             -pt given lokelma yesterday for elevated potassium             -BUN has been drifting up             -will ask renal to follow up with pt for recs             -may need some IVF based on today's labs 10.  Hyperlipidemia.  Crestor 11.  Permissive hypertension.  Patient on amlodipine 10 mg daily, hydralazine 50 mg twice daily, labetalol 200 mg twice daily prior to admission.  Resume as needed 12.  Medical noncompliance.  Provide counseling       Charlton Amor, PA-C 08/01/2022  I have personally performed a face to face diagnostic evaluation of this patient and formulated the key components of the plan.  Additionally, I have personally reviewed laboratory data, imaging studies, as well as relevant notes and concur with the physician assistant's documentation above.  The patient's status has not changed from the original H&P.  Any changes in documentation from the acute care chart have been noted above.  Ranelle Oyster, MD, [place]

## 2022-08-02 NOTE — Plan of Care (Signed)
  Problem: RH Balance Goal: LTG: Patient will maintain dynamic sitting balance (OT) Description: LTG:  Patient will maintain dynamic sitting balance with assistance during activities of daily living (OT) Flowsheets (Taken 08/02/2022 1353) LTG: Pt will maintain dynamic sitting balance during ADLs with: Supervision/Verbal cueing Goal: LTG Patient will maintain dynamic standing with ADLs (OT) Description: LTG:  Patient will maintain dynamic standing balance with assist during activities of daily living (OT)  Flowsheets (Taken 08/02/2022 1353) LTG: Pt will maintain dynamic standing balance during ADLs with: Minimal Assistance - Patient > 75%   Problem: Sit to Stand Goal: LTG:  Patient will perform sit to stand in prep for activites of daily living with assistance level (OT) Description: LTG:  Patient will perform sit to stand in prep for activites of daily living with assistance level (OT) Flowsheets (Taken 08/02/2022 1353) LTG: PT will perform sit to stand in prep for activites of daily living with assistance level: Minimal Assistance - Patient > 75%   Problem: RH Bathing Goal: LTG Patient will bathe all body parts with assist levels (OT) Description: LTG: Patient will bathe all body parts with assist levels (OT) Flowsheets (Taken 08/02/2022 1353) LTG: Pt will perform bathing with assistance level/cueing: Minimal Assistance - Patient > 75%   Problem: RH Dressing Goal: LTG Patient will perform upper body dressing (OT) Description: LTG Patient will perform upper body dressing with assist, with/without cues (OT). Flowsheets (Taken 08/02/2022 1353) LTG: Pt will perform upper body dressing with assistance level of: Set up assist Goal: LTG Patient will perform lower body dressing w/assist (OT) Description: LTG: Patient will perform lower body dressing with assist, with/without cues in positioning using equipment (OT) Flowsheets (Taken 08/02/2022 1353) LTG: Pt will perform lower body dressing with assistance  level of: Minimal Assistance - Patient > 75%   Problem: RH Toileting Goal: LTG Patient will perform toileting task (3/3 steps) with assistance level (OT) Description: LTG: Patient will perform toileting task (3/3 steps) with assistance level (OT)  Flowsheets (Taken 08/02/2022 1353) LTG: Pt will perform toileting task (3/3 steps) with assistance level: Moderate Assistance - Patient 50 - 74%   Problem: RH Vision Goal: RH LTG Vision Consulting civil engineer) Flowsheets (Taken 08/02/2022 1353) LTG: Vision Goals: Pt will visually attend his right side of his body and scan to the right to locate objects wtih supervision   Problem: RH Toilet Transfers Goal: LTG Patient will perform toilet transfers w/assist (OT) Description: LTG: Patient will perform toilet transfers with assist, with/without cues using equipment (OT) Flowsheets (Taken 08/02/2022 1353) LTG: Pt will perform toilet transfers with assistance level of: Minimal Assistance - Patient > 75%   Problem: RH Tub/Shower Transfers Goal: LTG Patient will perform tub/shower transfers w/assist (OT) Description: LTG: Patient will perform tub/shower transfers with assist, with/without cues using equipment (OT) Flowsheets (Taken 08/02/2022 1353) LTG: Pt will perform tub/shower stall transfers with assistance level of: Minimal Assistance - Patient > 75%   Problem: RH Memory Goal: LTG Patient will demonstrate ability for day to day recall/carry over during activities of daily living with assistance level (OT) Description: LTG:  Patient will demonstrate ability for day to day recall/carry over during activities of daily living with assistance level (OT). Flowsheets (Taken 08/02/2022 1353) LTG:  Patient will demonstrate ability for day to day recall/carry over during activities of daily living with assistance level (OT): Supervision

## 2022-08-02 NOTE — Progress Notes (Signed)
Occupational Therapy Session Note  Patient Details  Name: Joseph Bright MRN: [id number] Date of Birth: 30-Mar-1975  Today's Date: 08/02/2022 OT Individual Time: 1400-1500 OT Individual Time Calculation (min): 60 min    Short Term Goals: Week 1:  OT Short Term Goal 1 (Week 1): PT will transfer to the toilet/ BSC with mod A in both directions OT Short Term Goal 2 (Week 1): Pt will don shirt wtih mod A  unsupported. OT Short Term Goal 3 (Week 1): Pt will perform bed mobility to come to EOB in prep for ADLs- with mod A consistency OT Short Term Goal 4 (Week 1): Pt will perform sit to stands with min A consistency for clothing management.  Skilled Therapeutic Interventions/Progress Updates:  1:1 Pt received in w/c and reports toileting needs. PT taken into the bathroom and transfer to the toilet with mod A and then stood with mod A for +2 for clothing management. Pt adjusted self but still voided outside of the toilet and down right LE. Hygiene in standing with mod A for balance and +2 for hygiene.  Max A transfer to the right into w/c. Max A transfer into bed on right side. And max A to get into supine.   NMR with focus on right UE and LE in gravity eliminated plain to see if can achieve mm activity. Trace to 1 activity noted in shoulder and bicep and in hip and glut. Pt does present with full body twitches from neck down to toes- RN aware.  PT left in bed resting.   Balance/vestibular training;Disease mangement/prevention;Neuromuscular re-education;Self Care/advanced ADL retraining;Therapeutic Exercise;Wheelchair propulsion/positioning;Cognitive remediation/compensation;DME/adaptive equipment instruction;Pain management;Skin care/wound managment;UE/LE Strength taining/ROM;Community reintegration;Functional electrical stimulation;Patient/family education;Splinting/orthotics;UE/LE Coordination activities;Discharge planning;Functional mobility training;Psychosocial support;Therapeutic Activities    Therapy Documentation Precautions:  Precautions Precautions: Fall Precaution Comments: right sided hemiparesis, expressive deficits Restrictions Weight Bearing Restrictions: No  Pain:  No c/o pain  Therapy/Group: Individual Therapy  Roney Mans [facility] 08/02/2022, 3:02 PM

## 2022-08-02 NOTE — Progress Notes (Signed)
Inpatient Rehabilitation Care Coordinator Assessment and Plan Patient Details  Name: Joseph Bright MRN: [id number] Date of Birth: December 31, 1975  Today's Date: 08/02/2022  Hospital Problems: Principal Problem:   Cerebrovascular accident (CVA) due to occlusion of left posterior communicating artery [facility])  Past Medical History:  Past Medical History:  Diagnosis Date   Hypertension    Stroke [facility])    Past Surgical History:  Past Surgical History:  Procedure Laterality Date   IR PERCUTANEOUS ART THROMBECTOMY/INFUSION INTRACRANIAL INC DIAG ANGIO  07/25/2022   LOOP RECORDER INSERTION N/A 07/28/2022   Procedure: LOOP RECORDER INSERTION;  Surgeon: Regan Lemming, MD;  Location: [facility] INVASIVE CV LAB;  Service: Cardiovascular;  Laterality: N/A;   RADIOLOGY WITH ANESTHESIA N/A 07/25/2022   Procedure: RADIOLOGY WITH ANESTHESIA;  Surgeon: Radiologist, Medication, MD;  Location: [facility] OR;  Service: Radiology;  Laterality: N/A;   TEE WITHOUT CARDIOVERSION N/A 07/28/2022   Procedure: TRANSESOPHAGEAL ECHOCARDIOGRAM;  Surgeon: Parke Poisson, MD;  Location: [facility] INVASIVE CV LAB;  Service: Cardiovascular;  Laterality: N/A;   Social History:  reports that he quit smoking about 22 years ago. His smoking use included cigarettes. He started smoking about 27 years ago. He has never used smokeless tobacco. He reports current alcohol use. He reports that he does not use drugs.  Family / Support Systems Patient Roles: Spouse, Parent Spouse/Significant Other: Programme researcher, broadcasting/film/video- [place] Children: yes Other Supports: N/A Anticipated Caregiver: spouse, Joseph Bright Ability/Limitations of Caregiver: Spouse [facility], 24/7 Caregiver Availability: 24/7 Family Dynamics: support from spouse, family and friends.  Social History Preferred language: English Religion: Baptist Cultural Background: N/a Education: N/A Health Literacy - How often do you need to have someone help you when you read instructions, pamphlets, or other written  material from your doctor or pharmacy?: Rarely Writes: Yes Employment Status: Employed Name of Employer: Designer, industrial/product Return to Work Plans: TBD Marine scientist Issues: N/A Guardian/Conservator: N/A   Abuse/Neglect Abuse/Neglect Assessment Can Be Completed: Yes Physical Abuse: Denies Verbal Abuse: Denies Sexual Abuse: Denies Exploitation of patient/patient's resources: Denies Self-Neglect: Denies  Patient response to: Social Isolation - How often do you feel lonely or isolated from those around you?: Rarely  Emotional Status Pt's affect, behavior and adjustment status: Pleasant, emotional Recent Psychosocial Issues: Coping, Psychiatric History: N/A Substance Abuse History: N/A  Patient / Family Perceptions, Expectations & Goals Pt/Family understanding of illness & functional limitations: yes, spoke with spouse via telephone Premorbid pt/family roles/activities: Independent overall, working, driving, Designer, industrial/product for [employer], power w/c equipment Anticipated changes in roles/activities/participation: Spouse WFH able to assist 24/7 Pt/family expectations/goals: [employer]: None Premorbid Home Care/DME Agencies: None Transportation available at discharge: Spouse, friends Is the patient able to respond to transportation needs?: Yes In the past 12 months, has lack of transportation kept you from medical appointments or from getting medications?: No In the past 12 months, has lack of transportation kept you from meetings, work, or from getting things needed for daily living?: No Resource referrals recommended: Neuropsychology  Discharge Planning Living Arrangements: Spouse/significant other Support Systems: Spouse/significant other, Friends/neighbors Type of Residence: Other (Comment) (2nd level apartment, 15 steps) Insurance Resources: Media planner (specify) International aid/development worker) Financial Screen Referred: No Living  Expenses: Psychologist, sport and exercise Management: Spouse Does the patient have any problems obtaining your medications?: Yes (Describe) (noncompliance with HTN meds due to no med filled for 6 months) Home Management: Independent Patient/Family Preliminary Plans: Spouse able to assist with cogntive tasks Care Coordinator Barriers to Discharge: Inaccessible home environment, Decreased caregiver  support, Insurance for SNF coverage, Home environment access/layout Care Coordinator Barriers to Discharge Comments: 2nd level apartment (15 steps). Spouse asking about level entry apartment (living in their current apartment for 8 years) Care Coordinator Anticipated Follow Up Needs: HH/OP Expected length of stay: 21-28 Days  Clinical Impression SW met with pt in room and called spouse, Joseph Bright at bedside. Sw informed spouse that patient would be re-conferenced next week to determine goals, progress and potentially d/c date. Patient discharging back to 2nd level apartment (15 steps). Spouse [facility] and anticipating providing 24/7 at d/c. No additional questions or concerns .  Andria Rhein 08/02/2022, 1:44 PM

## 2022-08-02 NOTE — Evaluation (Signed)
Occupational Therapy Assessment and Plan  Patient Details  Name: Joseph Bright MRN: [id number] Date of Birth: 1975/09/18  OT Diagnosis: cognitive deficits, flaccid hemiplegia and hemiparesis, hemiplegia affecting dominant side, and muscle weakness (generalized) Rehab Potential: Rehab Potential (ACUTE ONLY): Good ELOS: ~20-25 days   Today's Date: 08/02/2022 OT Individual Time: 4098-1191 OT Individual Time Calculation (min): 75 min     Hospital Problem: Principal Problem:   Cerebrovascular accident (CVA) due to occlusion of left posterior communicating artery (HCC)   Past Medical History:  Past Medical History:  Diagnosis Date   Hypertension    Stroke [facility])    Past Surgical History:  Past Surgical History:  Procedure Laterality Date   IR PERCUTANEOUS ART THROMBECTOMY/INFUSION INTRACRANIAL INC DIAG ANGIO  07/25/2022   LOOP RECORDER INSERTION N/A 07/28/2022   Procedure: LOOP RECORDER INSERTION;  Surgeon: Regan Lemming, MD;  Location: [facility] INVASIVE CV LAB;  Service: Cardiovascular;  Laterality: N/A;   RADIOLOGY WITH ANESTHESIA N/A 07/25/2022   Procedure: RADIOLOGY WITH ANESTHESIA;  Surgeon: Radiologist, Medication, MD;  Location: [facility] OR;  Service: Radiology;  Laterality: N/A;   TEE WITHOUT CARDIOVERSION N/A 07/28/2022   Procedure: TRANSESOPHAGEAL ECHOCARDIOGRAM;  Surgeon: Parke Poisson, MD;  Location: [facility] INVASIVE CV LAB;  Service: Cardiovascular;  Laterality: N/A;    Assessment & Plan Clinical Impression: Patient is a 47 y.o. year old male ight-handed male with history of hypertension, CKD stage IV followed at outpatient nephrology, as well as medical noncompliance, quit smoking 22 years ago.  Per chart review patient lives with spouse-year-old daughter.  1 level home multiple steps to entry.  Independent prior to admission.  Presented 07/25/2022 with acute onset of right-sided weakness as well as speech difficulty.  Cranial CT scan negative for acute changes.  There was chronic  appearing right thalamic lacunar infarct.  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  Status post TNK with interventional radiology for stenting.  MRI follow-up showed confluent left ACA territory infarction.  Cytotoxic edema with no hemorrhagic transformation.  Multiple additional scattered mostly punctate ischemic foci in the left greater than right MCA, PCA and occasionally cerebellar artery territories.  No significant mass effect.  Echocardiogram with ejection fraction of 70 to 75% grade 1 diastolic dysfunction.  Patient with reported seizure-like activity.  EEG negative for seizure.  Patient was maintained on Keppra for seizure prophylaxis.  TEE unremarkable no PFO, no cardioembolic source.  Neurology follow-up maintained on aspirin as well as Brilinta 90 mg twice daily for CVA prophylaxis.  Underwent loop recorder placement 07/28/2022.  Subcutaneous heparin added for DVT prophylaxis.  Initially on tube feeds for nutritional support her diet has been advanced to regular.  Bouts of hyperkalemia patient did require Lokelma with latest potassium 5.8 and follow-up chemistries pending.  Patient transferred to CIR on 08/01/2022 .    Patient currently requires  max to total A   with basic self-care skills and functional transfers secondary to muscle weakness and aphasia, decreased cardiorespiratoy endurance, impaired timing and sequencing, abnormal tone, unbalanced muscle activation, decreased coordination, and decreased motor planning, decreased attention to right and decreased motor planning, decreased awareness, decreased problem solving, decreased safety awareness, and delayed processing, and decreased sitting balance, decreased standing balance, decreased postural control, hemiplegia, and decreased balance strategies.  Prior to hospitalization, patient could complete ALDs with independent .  Patient will benefit from skilled intervention to decrease level of assist with  basic self-care skills and increase independence with basic self-care skills  prior to discharge home with care partner.  Anticipate patient will require minimal physical assistance and follow up home health.  OT - End of Session Activity Tolerance: Tolerates 10 - 20 min activity with multiple rests Endurance Deficit: Yes OT Assessment Rehab Potential (ACUTE ONLY): Good OT Barriers to Discharge: Inaccessible home environment;Decreased caregiver support OT Patient demonstrates impairments in the following area(s): Balance;Behavior;Cognition;Edema;Endurance;Motor;Pain;Perception;Safety;Sensory OT Basic ADL's Functional Problem(s): Grooming;Bathing;Dressing OT Transfers Functional Problem(s): Toilet;Tub/Shower OT Additional Impairment(s): Fuctional Use of Upper Extremity OT Plan OT Intensity: Minimum of 1-2 x/day, 45 to 90 minutes OT Frequency: 5 out of 7 days OT Duration/Estimated Length of Stay: ~20-25 days OT Treatment/Interventions: Balance/vestibular training;Disease mangement/prevention;Neuromuscular re-education;Self Care/advanced ADL retraining;Therapeutic Exercise;Wheelchair propulsion/positioning;Cognitive remediation/compensation;DME/adaptive equipment instruction;Pain management;Skin care/wound managment;UE/LE Strength taining/ROM;Community reintegration;Functional electrical stimulation;Patient/family education;Splinting/orthotics;UE/LE Coordination activities;Discharge planning;Functional mobility training;Psychosocial support;Therapeutic Activities OT Self Feeding Anticipated Outcome(s): n/a OT Basic Self-Care Anticipated Outcome(s): min A OT Toileting Anticipated Outcome(s): min A OT Bathroom Transfers Anticipated Outcome(s): min A OT Recommendation Recommendations for Other Services: Speech consult Patient destination: Home Follow Up Recommendations: Home health OT Equipment Recommended: To be determined   OT Evaluation Precautions/Restrictions  Precautions Precautions:  Fall Precaution Comments: right sided hemiparesis, expressive deficits Restrictions Weight Bearing Restrictions: No  Pain Pain Assessment Pain Score: 2  Home Living/Prior Functioning Home Living Family/patient expects to be discharged to:: Private residence Living Arrangements: Spouse/significant other Available Help at Discharge: Available 24 hours/day Type of Home: Apartment Home Access: Stairs to enter [employer] of Steps: 15; 2nd floor apartment Entrance Stairs-Rails: Left, Right Home Layout: One level Bathroom Shower/Tub: Tub/shower unit  Lives With: Spouse Prior Function Level of Independence: (P) Independent with gait  Able to Take Stairs?: (P) Yes Driving: (P) Yes Vision Baseline Vision/History: 1 Wears glasses Ability to See in Adequate Light: 0 Adequate Patient Visual Report: No change from baseline Eye Alignment: Within Functional Limits Ocular Range of Motion: Within Functional Limits Tracking/Visual Pursuits: Able to track stimulus in all quads without difficulty Convergence: Within functional limits Perception  Perception: (P) Impaired Inattention/Neglect: (P) Does not attend to right side of body;Does not attend to right visual field Praxis Praxis: (P) Impaired Praxis Impairment Details: (P) Motor planning Cognition Cognition Arousal/Alertness: Awake/alert Orientation Level: Person;Place;Situation Person: Oriented Place: Oriented Situation: Oriented Memory: Appears intact Attention: Sustained Sustained Attention: Appears intact Awareness: Appears intact Safety/Judgment: Appears intact Brief Interview for Mental Status (BIMS) Repetition of Three Words (First Attempt): 3 Temporal Orientation: Year: Correct Temporal Orientation: Month: Accurate within 5 days Temporal Orientation: Day: Correct Recall: "Sock": No, could not recall Recall: "Blue": Yes, no cue required Recall: "Bed": No, could not recall BIMS Summary Score:  11 Sensation Sensation Light Touch: Impaired Detail Light Touch Impaired Details: Impaired RLE;Impaired RUE Proprioception: Impaired Detail Proprioception Impaired Details: Impaired RUE;Impaired RLE Coordination Gross Motor Movements are Fluid and Coordinated: No Fine Motor Movements are Fluid and Coordinated: No Motor  Motor Motor: Motor apraxia;Abnormal postural alignment and control;Abnormal tone;Hemiplegia Motor - Skilled Clinical Observations: R hemi  Trunk/Postural Assessment  Cervical Assessment Cervical Assessment: Within Functional Limits Thoracic Assessment Thoracic Assessment:  (rounded shoulders) Lumbar Assessment Lumbar Assessment:  (posterior pelvic tilt) Postural Control Postural Control: Deficits on evaluation Righting Reactions: delayed Protective Responses: delayed  Balance Static Sitting Balance Static Sitting - Balance Support: No upper extremity supported;Feet supported Static Sitting - Level of Assistance: 5: Stand by assistance Dynamic Sitting Balance Dynamic Sitting - Balance Support: During functional activity Dynamic Sitting - Level of Assistance: 4: Min assist Extremity/Trunk Assessment RUE Assessment RUE Assessment: Exceptions  to [facility] RUE Body System: Neuro Brunstrum levels for arm and hand: Arm;Hand Brunstrum level for arm: Stage I Presynergy Brunstrum level for hand: Stage I Flaccidity RUE Tone RUE Tone: Flaccid;Modified Ashworth Body Part - Modified Ashworth Scale: Elbow;Wrist;Fingers;Thumb Elbow - Modified Ashworth Scale for Grading Hypertonia RUE: No increase in muscle tone Wrist - Modified Ashworth Scale for Grading Hypertonia RUE: No increase in muscle tone Fingers - Modified Ashworth Scale for Grading Hypertonia RUE: No increase in muscle tone Thumb - Modified Ashworth Scale for Grading Hypertonia RUE: No increase in muscle tone LUE Assessment LUE Assessment: Within Functional Limits  Care Tool Care Tool Self Care Eating   Eating  Assist Level: Set up assist    Oral Care    Oral Care Assist Level: Moderate Assistance - Patient 50 - 74%    Bathing   Body parts bathed by patient: Abdomen;Chest Body parts bathed by helper: Right arm;Left arm;Front perineal area;Buttocks;Right upper leg;Left upper leg;Right lower leg;Left lower leg   Assist Level: Total Assistance - Patient < 25%    Upper Body Dressing(including orthotics)   What is the patient wearing?: Pull over shirt   Assist Level: Maximal Assistance - Patient 25 - 49%    Lower Body Dressing (excluding footwear)   What is the patient wearing?: Pants;Incontinence brief Assist for lower body dressing: Maximal Assistance - Patient 25 - 49%    Putting on/Taking off footwear   What is the patient wearing?: Non-skid slipper socks;Shoes Assist for footwear: Total Assistance - Patient < 25%       Care Tool Toileting Toileting activity   Assist for toileting: Dependent - Patient 0%     Care Tool Bed Mobility Roll left and right activity   Roll left and right assist level: Moderate Assistance - Patient 50 - 74%    Sit to lying activity   Sit to lying assist level: Moderate Assistance - Patient 50 - 74%    Lying to sitting on side of bed activity   Lying to sitting on side of bed assist level: the ability to move from lying on the back to sitting on the side of the bed with no back support.: Maximal Assistance - Patient 25 - 49%     Care Tool Transfers Sit to stand transfer   Sit to stand assist level: Moderate Assistance - Patient 50 - 74%    Chair/bed transfer   Chair/bed transfer assist level: Maximal Assistance - Patient 25 - 49%     Toilet transfer   Assist Level: Maximal Assistance - Patient 24 - 49%     Care Tool Cognition  Expression of Ideas and Wants Expression of Ideas and Wants: 1. Rarely/Never expressess or very difficult - rarely/never expresses self or speech is very difficult to understand  Understanding Verbal and Non-Verbal Content  Understanding Verbal and Non-Verbal Content: 3. Usually understands - understands most conversations, but misses some part/intent of message. Requires cues at times to understand   Memory/Recall Ability     Refer to Care Plan for Long Term Goals  SHORT TERM GOAL WEEK 1 OT Short Term Goal 1 (Week 1): PT will transfer to the toilet/ BSC with mod A in both directions OT Short Term Goal 2 (Week 1): Pt will don shirt wtih mod A  unsupported. OT Short Term Goal 3 (Week 1): Pt will perform bed mobility to come to EOB in prep for ADLs- with mod A consistency OT Short Term Goal 4 (Week 1): Pt will perform sit  to stands with min A consistency for clothing management.  Recommendations for other services: Neuropsych   Skilled Therapeutic Intervention  1:1 OT eval intiated with OT goals, purpose and role discussed. SElf care retraining at sink level . Pt received in bed and periwick had leak all over bed. Performed pericare and changing of brief with total A with max A for rolling in bed with total A for management of right  UE. Pt noted to have extensor tone in lower LE with difficulty to bend to assist with bed mobility. Pt came to EOB with max A. TP able to sit EOB with close supervision. Mod to max A squat pivot to the left into TIS w/c. Bathed and dressed sitting at the sink with focus on education on hemi dressing techniques. Sit to stand with mod A with heavy weight shift to the left.  PT able to communicate best th word level. His  yes and nos were not always accurate. Pt functionally was able to follow commands.   ADL ADL Eating: Set up Where Assessed-Eating: Wheelchair Grooming: Minimal assistance Where Assessed-Grooming: Sitting at sink Where Assessed-Upper Body Bathing: Sitting at sink Lower Body Bathing: Moderate assistance Where Assessed-Lower Body Bathing: Sitting at sink Upper Body Dressing: Moderate assistance Where Assessed-Upper Body Dressing: Sitting at sink Lower Body Dressing:  Maximal assistance Where Assessed-Lower Body Dressing: Sitting at sink Toileting: Dependent Toilet Transfer: Maximal assistance Toilet Transfer Method: Squat pivot Mobility  Transfers Sit to Stand: Moderate Assistance - Patient 50-74% Stand to Sit: Moderate Assistance - Patient 50-74%   Discharge Criteria: Patient will be discharged from OT if patient refuses treatment 3 consecutive times without medical reason, if treatment goals not met, if there is a change in medical status, if patient makes no progress towards goals or if patient is discharged from hospital.  The above assessment, treatment plan, treatment alternatives and goals were discussed and mutually agreed upon: by patient  Adan Sis 08/02/2022, 1:21 PM

## 2022-08-02 NOTE — Patient Care Conference (Signed)
Inpatient RehabilitationTeam Conference and Plan of Care Update Date: 08/02/2022   Time: 10:39 AM    Patient Name: Joseph Bright      Medical Record Number: [id number]  Date of Birth: Sep 10, 1975 Sex: Male         Room/Bed: 4W07C/4W07C-01 Payor Info: Payor: CIGNA / Plan: CIGNA MANAGED / Product Type: *No Product type* /    Admit Date/Time:  08/01/2022  4:34 PM  Primary Diagnosis:  Cerebrovascular accident (CVA) due to occlusion of left posterior communicating artery [facility])  Hospital Problems: Principal Problem:   Cerebrovascular accident (CVA) due to occlusion of left posterior communicating artery [facility])    Expected Discharge Date: Expected Discharge Date:  (evals pending)  Team Members Present: Physician leading conference: Dr. Claudette Laws Social Worker Present: Lavera Guise, BSW PT Present: Casimiro Needle, PT OT Present: Bonnell Public, OT SLP Present: Other (comment) Fae Pippin, SLP) PPS Coordinator present : Fae Pippin, SLP     Current Status/Progress Goal Weekly Team Focus  Bowel/Bladder   Pt is incontinent of bowel/bladder   Pt will gain continence of bowel/bladder   Will assess qshift and PRN    Swallow/Nutrition/ Hydration               ADL's   eval pending   eval pending   eval pending    Mobility   eval pending           Communication   Eval Pending (CIR SLP eval on 5/9)            Safety/Cognition/ Behavioral Observations  Eval Pending (CIR SLP eval on 5/9)            Pain   Pt currently denies pain   Pt will remain pain free   Will assess qshift and PRN    Skin   Pt's skin is intact   Pt's skin will remain intact  Will assess qshift and PRN      Discharge Planning:    Discharge home with wife; 15 ste apt with bilateral rails  Team Discussion: Patient post left MCA CVA; with good receptive language but symptomatic hypertension. Hyperkalemia post treatment per MD; renal consulted for fluid and electrolyte management.   Loop recorder in place.  Patient on target to meet rehab goals: Evaluations pending.  *See Care Plan and progress notes for long and short-term goals.   Revisions to Treatment Plan:  N/A   Teaching Needs: Safety, medications, dietary modifications, transfers, toileting, etc.   Current Barriers to Discharge: Decreased caregiver support, Home enviroment access/layout, Insurance for SNF coverage, and insurance for follow up services  Possible Resolutions to Barriers: Family education     Medical Summary Current Status: severe exp aphasia , 0/5 strength, HTN elevated  Barriers to Discharge: Medical stability   Possible Resolutions to [employer] Focus: med management , initial rehab evals pending   Continued Need for Acute Rehabilitation Level of Care: The patient requires daily medical management by a physician with specialized training in physical medicine and rehabilitation for the following reasons: Direction of a multidisciplinary physical rehabilitation program to maximize functional independence : Yes Medical management of patient stability for increased activity during participation in an intensive rehabilitation regime.: Yes Analysis of laboratory values and/or radiology reports with any subsequent need for medication adjustment and/or medical intervention. : Yes   I attest that I was present, lead the team conference, and concur with the assessment and plan of the team.   Chana Bode B 08/02/2022, 3:39 PM

## 2022-08-02 NOTE — Progress Notes (Signed)
PROGRESS NOTE   Subjective/Complaints:  Aphasic but able to state name and follow simple commands   ROS- limited by exp aphasia   Objective:   No results found. Recent Labs    08/01/22 0620 08/02/22 0504  WBC 8.2 8.6  HGB 14.7 14.5  HCT 44.5 44.2  PLT 229 238   Recent Labs    08/01/22 1657 08/02/22 0504  NA 137 137  K 4.7 5.3*  CL 104 105  CO2 21* 21*  GLUCOSE 100* 106*  BUN 69* 70*  CREATININE 3.13* 3.24*  CALCIUM 9.0 9.0    Intake/Output Summary (Last 24 hours) at 08/02/2022 0953 Last data filed at 08/02/2022 0700 Gross per 24 hour  Intake 250 ml  Output 1900 ml  Net -1650 ml        Physical Exam: Vital Signs Blood pressure (!) 136/98, pulse 74, temperature 97.8 F (36.6 C), resp. rate 18, height 5\' 9"  (1.753 m), weight 107.7 kg, SpO2 99 %.   General: No acute distress Mood and affect are appropriate Heart: Regular rate and rhythm no rubs murmurs or extra sounds Lungs: Clear to auscultation, breathing unlabored, no rales or wheezes Abdomen: Positive bowel sounds, soft nontender to palpation, nondistended Extremities: No clubbing, cyanosis, or edema Skin: No evidence of breakdown, no evidence of rash Neurologic: Cranial nerves II through XII intact, motor strength is 5/5 in Left and 0/5 right deltoid, bicep, tricep, grip, hip flexor, knee extensors, ankle dorsiflexor and plantar flexor Sensory exam normal sensation to light touch and proprioception in bilateral upper and lower extremities Cerebellar exam unable to do on right due to weakness  Musculoskeletal: Full range of motion in all 4 extremities. No joint swelling   Assessment/Plan: 1. Functional deficits which require 3+ hours per day of interdisciplinary therapy in a comprehensive inpatient rehab setting. Physiatrist is providing close team supervision and 24 hour management of active medical problems listed below. Physiatrist and rehab team  continue to assess barriers to discharge/monitor patient progress toward functional and medical goals  Care Tool:  Bathing              Bathing assist       Upper Body Dressing/Undressing Upper body dressing        Upper body assist      Lower Body Dressing/Undressing Lower body dressing            Lower body assist       Toileting Toileting    Toileting assist       Transfers Chair/bed transfer  Transfers assist           Locomotion Ambulation   Ambulation assist              Walk 10 feet activity   Assist           Walk 50 feet activity   Assist           Walk 150 feet activity   Assist           Walk 10 feet on uneven surface  activity   Assist           Wheelchair     Assist  Wheelchair 50 feet with 2 turns activity    Assist            Wheelchair 150 feet activity     Assist          Blood pressure (!) 136/98, pulse 74, temperature 97.8 F (36.6 C), resp. rate 18, height 5\' 9"  (1.753 m), weight 107.7 kg, SpO2 99 %.  Medical Problem List and Plan: 1. 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  Antithrombotics: -DVT/anticoagulation:  Pharmaceutical: Heparin             -antiplatelet therapy: Aspirin 81 mg daily and Brilinta 90 mg twice daily 3. Pain Management: Tylenol as needed 4. Mood/Behavior/Sleep: Provide emotional support             -antipsychotic agents: N/A 5. Neuropsych/cognition: This patient is not capable of making decisions on his own behalf. 6. Skin/Wound Care: Routine skin checks 7. Fluids/Electrolytes/Nutrition: Routine in and outs with follow-up chemistries 8.  Seizure prophylaxis.  EEG negative.  Continue Keppra 500 mg daily 9.  AKI on CKD stage IV.  Follow-up chemistries.   Follow-up outpatient nephrology services             -pt given lokelma yesterday for elevated potassium             -BUN has been drifting up             -will ask renal to follow up with pt for recs             -may need some IVF based on today's labs 10.  Hyperlipidemia.  Crestor 11.  Permissive hypertension.  Patient on amlodipine 10 mg daily, hydralazine 50 mg twice daily, labetalol 200 mg twice daily prior to admission.  Resume as needed Vitals:   08/01/22 2122 08/02/22 0455  BP:  (!) 136/98  Pulse: 78 74  Resp: 18 18  Temp:  97.8 F (36.6 C)  SpO2: 99% 99%    12.  Medical noncompliance.  Provide counseling    LOS: 1 days A FACE TO FACE EVALUATION WAS PERFORMED  Erick Colace 08/02/2022, 9:53 AM

## 2022-08-02 NOTE — Progress Notes (Signed)
Inpatient Rehabilitation Admission Medication Review by a Pharmacist  A complete drug regimen review was completed for this patient to identify any potential clinically significant medication issues.  High Risk Drug Classes Is patient taking? Indication by Medication  Antipsychotic No   Anticoagulant Yes Heparin- vte ppx  Antibiotic No   Opioid No   Antiplatelet Yes Asa, ticagrelor- stent placement  Hypoglycemics/insulin Yes Insulin- T2DM  Vasoactive Medication Yes Hydralazine- HTN  Chemotherapy No   Other Yes Keppra- seizure ppx Crestor- HLD     Type of Medication Issue Identified Description of Issue Recommendation(s)  Drug Interaction(s) (clinically significant)     Duplicate Therapy     Allergy     No Medication Administration End Date     Incorrect Dose     Additional Drug Therapy Needed     Significant med changes from prior encounter (inform family/care partners about these prior to discharge). Zocor >> Crestor Counsel patient on discontinuation of zocor and start of crestor and importance of not taking the two medications together  Other  PTA meds: Norvasc labetolol Restart PTA meds when and if necessary during CIR admission or at time of discharge, if warranted    Clinically significant medication issues were identified that warrant physician communication and completion of prescribed/recommended actions by midnight of the next day:  No   Time spent performing this drug regimen review (minutes):  30   Maily Debarge BS, PharmD, BCPS Clinical Pharmacist 08/02/2022 7:06 AM  Contact: [phone] after 3 PM  "Be curious, not judgmental..." -Debbora Dus

## 2022-08-02 NOTE — Progress Notes (Signed)
Inpatient Rehabilitation Center Individual Statement of Services  Patient Name:  Joseph Bright  Date:  08/02/2022  Welcome to the Inpatient Rehabilitation Center.  Our goal is to provide you with an individualized program based on your diagnosis and situation, designed to meet your specific needs.  With this comprehensive rehabilitation program, you will be expected to participate in at least 3 hours of rehabilitation therapies Monday-Friday, with modified therapy programming on the weekends.  Your rehabilitation program will include the following services:  Physical Therapy (PT), Occupational Therapy (OT), Speech Therapy (ST), 24 hour per day rehabilitation nursing, Therapeutic Recreaction (TR), Neuropsychology, Care Coordinator, Rehabilitation Medicine, Nutrition Services, Pharmacy Services, and Other  Weekly team conferences will be held on Wednesdays to discuss your progress.  Your Inpatient Rehabilitation Care Coordinator will talk with you frequently to get your input and to update you on team discussions.  Team conferences with you and your family in attendance may also be held.  Expected length of stay: 21 to 28 days   Overall anticipated outcome:  Min A  Depending on your progress and recovery, your program may change. Your Inpatient Rehabilitation Care Coordinator will coordinate services and will keep you informed of any changes. Your Inpatient Rehabilitation Care Coordinator's name and contact numbers are listed  below.  The following services may also be recommended but are not provided by the Inpatient Rehabilitation Center:   Home Health Rehabiltiation Services Outpatient Rehabilitation Services    Arrangements will be made to provide these services after discharge if needed.  Arrangements include referral to agencies that provide these services.  Your insurance has been verified to be:   Optician, dispensing Your primary doctor is:  Wyvonnia Dusky, MD  Pertinent information will  be shared with your doctor and your insurance company.  Inpatient Rehabilitation Care Coordinator:  Lavera Guise, [place] [phone] or [phone]  Information discussed with and copy given to patient by: Andria Rhein, 08/02/2022, 9:15 AM

## 2022-08-02 NOTE — Telephone Encounter (Signed)
Pt transferred to inpatient CIR.  Pt is connected in [facility] for loop recorder monitoring.  Will continue to monitor remotely for atrial fibrillation.

## 2022-08-02 NOTE — Discharge Instructions (Addendum)
Inpatient Rehab Discharge Instructions  Joseph Bright Discharge date and time: No discharge date for patient encounter.   Activities/Precautions/ Functional Status: Activity: activity as tolerated Diet: regular diet 1500 mL fluid restriction Wound Care: Routine skin checks Functional status:  ___ No restrictions     ___ Walk up steps independently ___ 24/7 supervision/assistance   ___ Walk up steps with assistance ___ Intermittent supervision/assistance  ___ Bathe/dress independently ___ Walk with walker     _x__ Bathe/dress with assistance ___ Walk Independently    ___ Shower independently ___ Walk with assistance    ___ Shower with assistance ___ No alcohol     ___ Return to work/school ________  Special Instructions: No driving smoking or alcohol  Ambulatory referral obtained with pulmonary services to evaluate for CPAP   STROKE/TIA DISCHARGE INSTRUCTIONS SMOKING Cigarette smoking nearly doubles your risk of having a stroke & is the single most alterable risk factor  If you smoke or have smoked in the last 12 months, you are advised to quit smoking for your health. Most of the excess cardiovascular risk related to smoking disappears within a year of stopping. Ask you doctor about anti-smoking medications [place] Quit Line: 1-800-QUIT NOW Free Smoking Cessation Classes [phone]  CHOLESTEROL Know your levels; limit fat & cholesterol in your diet  Lipid Panel     Component Value Date/Time   CHOL 165 07/27/2022 0624   TRIG 193 (H) 08/01/2022 0620   HDL 28 (L) 07/27/2022 0624   CHOLHDL 5.9 07/27/2022 0624   VLDL 63 (H) 07/27/2022 0624   LDLCALC 74 07/27/2022 0624     Many patients benefit from treatment even if their cholesterol is at goal. Goal: Total Cholesterol (CHOL) less than 160 Goal:  Triglycerides (TRIG) less than 150 Goal:  HDL greater than 40 Goal:  LDL (LDLCALC) less than 100   BLOOD PRESSURE American Stroke Association blood pressure target is less that  120/80 mm/Hg  Your discharge blood pressure is:  BP: (!) 136/98 Monitor your blood pressure Limit your salt and alcohol intake Many individuals will require more than one medication for high blood pressure  DIABETES (A1c is a blood sugar average for last 3 months) Goal HGBA1c is under 7% (HBGA1c is blood sugar average for last 3 months)  Diabetes: No known diagnosis of diabetes    Lab Results  Component Value Date   HGBA1C 5.6 07/25/2022    Your HGBA1c can be lowered with medications, healthy diet, and exercise. Check your blood sugar as directed by your physician Call your physician if you experience unexplained or low blood sugars.  PHYSICAL ACTIVITY/REHABILITATION Goal is 30 minutes at least 4 days per week  Activity: Increase activity slowly, Therapies: Physical Therapy: Home Health Return to work:  Activity decreases your risk of heart attack and stroke and makes your heart stronger.  It helps control your weight and blood pressure; helps you relax and can improve your mood. Participate in a regular exercise program. Talk with your doctor about the best form of exercise for you (dancing, walking, swimming, cycling).  DIET/WEIGHT Goal is to maintain a healthy weight  Your discharge diet is:  Diet Order             Diet regular Room service appropriate? Yes with Assist; Fluid consistency: Thin  Diet effective now                   liquids Your height is:  Height: 5\' 9"  (175.3 cm) Your current weight is:  Weight: 107.7 kg Your Body Mass Index (BMI) is:  BMI (Calculated): 35.05 Following the type of diet specifically designed for you will help prevent another stroke. Your goal weight range is:   Your goal Body Mass Index (BMI) is 19-24. Healthy food habits can help reduce 3 risk factors for stroke:  High cholesterol, hypertension, and excess weight.  RESOURCES Stroke/Support Group:  Call [phone]   STROKE EDUCATION PROVIDED/REVIEWED AND GIVEN TO PATIENT Stroke warning  signs and symptoms How to activate emergency medical system (call 911). Medications prescribed at discharge. Need for follow-up after discharge. Personal risk factors for stroke. Pneumonia vaccine given: No Flu vaccine given: No My questions have been answered, the writing is legible, and I understand these instructions.  I will adhere to these goals & educational materials that have been provided to me after my discharge from the hospital.       My questions have been answered and I understand these instructions. I will adhere to these goals and the provided educational materials after my discharge from the hospital.  Patient/Caregiver Signature _______________________________ Date __________  Clinician Signature _______________________________________ Date __________  Please bring this form and your medication list with you to all your follow-up doctor's appointments.

## 2022-08-03 DIAGNOSIS — I6329 Cerebral infarction due to unspecified occlusion or stenosis of other precerebral arteries: Secondary | ICD-10-CM | POA: Diagnosis not present

## 2022-08-03 LAB — BASIC METABOLIC PANEL
Anion gap: 10 (ref 5–15)
BUN: 77 mg/dL — ABNORMAL HIGH (ref 6–20)
CO2: 20 mmol/L — ABNORMAL LOW (ref 22–32)
Calcium: 9.6 mg/dL (ref 8.9–10.3)
Chloride: 111 mmol/L (ref 98–111)
Creatinine, Ser: 3.28 mg/dL — ABNORMAL HIGH (ref 0.61–1.24)
GFR, Estimated: 22 mL/min — ABNORMAL LOW (ref >60)
Glucose, Bld: 99 mg/dL (ref 70–99)
Potassium: 5.2 mmol/L — ABNORMAL HIGH (ref 3.5–5.1)
Sodium: 141 mmol/L (ref 135–145)

## 2022-08-03 LAB — GLUCOSE, CAPILLARY
Glucose-Capillary: 78 mg/dL (ref 70–99)
Glucose-Capillary: 91 mg/dL (ref 70–99)
Glucose-Capillary: 103 mg/dL — ABNORMAL HIGH (ref 70–99)
Glucose-Capillary: 99 mg/dL (ref 70–99)

## 2022-08-03 NOTE — Plan of Care (Signed)
  Problem: RH Comprehension Communication Goal: LTG Patient will comprehend basic/complex auditory (SLP) Description: LTG: Patient will comprehend basic/complex auditory information with cues (SLP). Flowsheets (Taken 08/03/2022 1135) LTG: Patient will comprehend:  Basic auditory information  Complex auditory information LTG: Patient will comprehend auditory information with cueing (SLP): Minimal Assistance - Patient > 75%   Problem: RH Expression Communication Goal: LTG Patient will express needs/wants via multi-modal(SLP) Description: LTG:  Patient will express needs/wants via multi-modal communication (gestures/written, etc) with cues (SLP) Flowsheets (Taken 08/03/2022 1135) LTG: Patient will express needs/wants via multimodal communication (gestures/written, etc) with cueing (SLP): Minimal Assistance - Patient > 75% Goal: LTG Patient will verbally express basic/complex needs(SLP) Description: LTG:  Patient will verbally express basic/complex needs, wants or ideas with cues  (SLP) Flowsheets (Taken 08/03/2022 1135) LTG: Patient will verbally express basic/complex needs, wants or ideas (SLP): Minimal Assistance - Patient > 75% Goal: LTG Patient will increase word finding of common (SLP) Description: LTG:  Patient will increase word finding of common objects/daily info/abstract thoughts with cues using compensatory strategies (SLP). Flowsheets (Taken 08/03/2022 1135) LTG: Patient will increase word finding of common (SLP): Minimal Assistance - Patient > 75% Patient will use compensatory strategies to increase word finding of:  Common objects  Daily info  Abstract thoughts

## 2022-08-03 NOTE — IPOC Note (Addendum)
Overall Plan of Care [facility]) Patient Details Name: Joseph Bright MRN: [id number] DOB: 06-14-1975  Admitting Diagnosis: Cerebrovascular accident (CVA) due to occlusion of left posterior communicating artery [facility])  Hospital Problems: Principal Problem:   Cerebrovascular accident (CVA) due to occlusion of left posterior communicating artery (HCC)     Functional Problem List: Nursing Bladder, Bowel, Safety, Sensory, Edema, Endurance, Medication Management, Motor  PT    OT Balance, Behavior, Cognition, Edema, Endurance, Motor, Pain, Perception, Safety, Sensory  SLP Linguistic  TR         Basic ADL's: OT Grooming, Bathing, Dressing     Advanced  ADL's: OT       Transfers: PT Bed Mobility, Bed to Chair, Car, Occupational psychologist, Research scientist (life sciences): PT       Additional Impairments: OT Fuctional Use of Upper Extremity  SLP Communication comprehension, expression    TR      Anticipated Outcomes Item Anticipated Outcome  Self Feeding n/a  Swallowing      Basic self-care  min A  Toileting  min A   Bathroom Transfers min A  Bowel/Bladder  continent B/B  Transfers  min assist  Locomotion  min to mod assist short distance ambulation; supervision to mod I w/c mobility  Communication  Min A  Cognition     Pain  less than 4  Safety/Judgment  remain fall free   Therapy Plan: PT Intensity: Minimum of 1-2 x/day ,45 to 90 minutes PT Frequency: 5 out of 7 days PT Duration Estimated Length of Stay: 3-3.5 weeks OT Intensity: Minimum of 1-2 x/day, 45 to 90 minutes OT Frequency: 5 out of 7 days OT Duration/Estimated Length of Stay: ~20-25 days SLP Intensity: Minumum of 1-2 x/day, 30 to 90 minutes SLP Frequency: 1 to 3 out of 7 days SLP Duration/Estimated Length of Stay: 21-28 days   Team Interventions: Nursing Interventions Patient/Family Education, Bladder Management, Medication Management, Discharge Planning, Bowel Management, Psychosocial Support, Disease  Management/Prevention, Cognitive Remediation/Compensation  PT interventions Ambulation/gait training, Metallurgist training, Cognitive remediation/compensation, Community reintegration, Discharge planning, Disease management/prevention, DME/adaptive equipment instruction, Functional electrical stimulation, Functional mobility training, Neuromuscular re-education, Pain management, Patient/family education, Psychosocial support, Skin care/wound management, Splinting/orthotics, Stair training, Therapeutic Activities, Therapeutic Exercise, UE/LE Strength taining/ROM, UE/LE Coordination activities, Visual/perceptual remediation/compensation, Wheelchair propulsion/positioning  OT Interventions Balance/vestibular training, Disease mangement/prevention, Neuromuscular re-education, Self Care/advanced ADL retraining, Therapeutic Exercise, Wheelchair propulsion/positioning, Cognitive remediation/compensation, DME/adaptive equipment instruction, Pain management, Skin care/wound managment, UE/LE Strength taining/ROM, Community reintegration, Functional electrical stimulation, Patient/family education, Splinting/orthotics, UE/LE Coordination activities, Discharge planning, Functional mobility training, Psychosocial support, Therapeutic Activities  SLP Interventions Patient/family education, Therapeutic Activities, Cueing hierarchy, Functional tasks, Speech/Language facilitation, Multimodal communication approach  TR Interventions    SW/CM Interventions Discharge Planning, Psychosocial Support, Patient/Family Education, Disease Management/Prevention   Barriers to Discharge MD  Medical stability and communication issues   Nursing Decreased caregiver support, Home environment access/layout, Incontinence, Weight, Medication compliance 1 level apartment up 15 ste with rails on right/left  PT Inaccessible home environment, Decreased caregiver support    OT Inaccessible home environment, Decreased caregiver support     SLP      SW Inaccessible home environment, Decreased caregiver support, Insurance for SNF coverage, Home environment access/layout 2nd level apartment (15 steps). Spouse asking about level entry apartment (living in their current apartment for 8 years)   Team Discharge Planning: Destination: PT-Home ,OT- Home , SLP-Home Projected Follow-up: PT-Home health PT, Outpatient PT, 24 hour supervision/assistance, OT-  Home health OT, SLP-Outpatient  SLP, Home Health SLP, 24 hour supervision/assistance Projected Equipment Needs: PT-Wheelchair cushion (measurements), Wheelchair (measurements), To be determined, OT- To be determined, SLP-None recommended by SLP Equipment Details: PT- , OT-  Patient/family involved in discharge planning: PT- Patient,  OT-Patient, SLP-Patient  MD ELOS: 21-28d Medical Rehab Prognosis:  Good Assessment: The patient has been admitted for CIR therapies with the diagnosis of Left MCA infarct . The team will be addressing functional mobility, strength, stamina, balance, safety, adaptive techniques and equipment, self-care, bowel and bladder mgt, patient and caregiver education, HTN, Aphasia . Goals have been set at Min A. Anticipated discharge destination is Home with family assist.        See Team Conference Notes for weekly updates to the plan of care

## 2022-08-03 NOTE — Evaluation (Signed)
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 [facility])  Past Medical History:  Past Medical History:  Diagnosis Date   Hypertension    Stroke [facility])    Past Surgical History:  Past Surgical History:  Procedure Laterality Date   IR PERCUTANEOUS ART THROMBECTOMY/INFUSION INTRACRANIAL INC DIAG ANGIO  07/25/2022   LOOP RECORDER INSERTION N/A 07/28/2022   Procedure: LOOP RECORDER INSERTION;  Surgeon: Regan Lemming, MD;  Location: [facility] INVASIVE CV LAB;  Service: Cardiovascular;  Laterality: N/A;   RADIOLOGY WITH ANESTHESIA N/A 07/25/2022   Procedure: RADIOLOGY WITH ANESTHESIA;  Surgeon: Radiologist, Medication, MD;  Location: [facility] OR;  Service: Radiology;  Laterality: N/A;   TEE WITHOUT CARDIOVERSION N/A 07/28/2022   Procedure: TRANSESOPHAGEAL ECHOCARDIOGRAM;  Surgeon: Parke Poisson, MD;  Location: [facility] INVASIVE CV LAB;  Service: Cardiovascular;  Laterality: N/A;    Assessment / Plan / Recommendation Clinical Impression  HPI: Joseph Bright is a 47 year old right-handed male with history of hypertension, CKD stage IV followed at outpatient nephrology, as well as medical noncompliance, quit smoking 22 years ago. Per chart review patient lives with spouse-year-old daughter. 1 level home multiple steps to entry. Independent prior to admission. Presented 07/25/2022 with acute onset of right-sided weakness as well as speech difficulty. Cranial CT scan negative for acute  changes. There was chronic appearing right thalamic lacunar infarct. 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 Status post TNK with interventional radiology for stenting. MRI follow-up showed confluent left ACA territory infarction. Cytotoxic edema with no hemorrhagic transformation. Multiple additional scattered mostly punctate ischemic foci in the left greater than right MCA, PCA and occasionally cerebellar artery territories. No significant mass effect. Echocardiogram with ejection fraction of 70 to 75% grade 1 diastolic dysfunction. Patient with reported seizure-like activity. EEG negative for seizure. Patient was maintained on Keppra for seizure prophylaxis. TEE unremarkable no PFO, no cardioembolic source. Neurology follow-up maintained on aspirin as well as Brilinta 90 mg twice daily for CVA prophylaxis. Underwent loop recorder placement 07/28/2022. Subcutaneous heparin added for DVT prophylaxis. Initially on tube feeds for nutritional support her diet has been advanced to regular. Bouts of hyperkalemia patient did require Lokelma with latest potassium 5.8 and follow-up chemistries pending. Therapy evaluations completed due to patient's decreased functional mobility and right-sided weakness was admitted for a comprehensive rehab program.    Skilled Therapeutic Interventions          Pt seen for SLP session to complete informal speech/language assessment and clinical swallow assessment.   Swallowing Per EMR, pt advanced to a regular diet and thin liquids on 07/31/22 in acute care. Pt denied current swallowing concerns or concerns for aspiration with PO intake.   Pt presents with a grossly functional oropharyngeal swallow per clinical swallow assessment completed today. Oral prep and transit efficient. There was x1 instance of min amount of cracker residue noted in R buccal sulci. Pt able to clear with cue to initiate lingual sweep. Pharyngeal swallow initiation  appeared prompt with laryngeal elevation noted. No overt or subtle s/s  of aspiration observed.   Recommend continue regular diet and thin liquids as tolerated. Pt will need set up of meal tray due to R sided weakness, though can consume meals without supervision. No swallowing needs identified at this time.   Speech/Language Pt presents with non-fluent expressive and receptive aphasia per informal and formal portions or speech language assessment. Pt presents with aphasia characteristics that best align with transcortical motor aphasia per Western Aphasia Battery Bedside Form. Pt scored 67/100 for Bedside Aphasia Score.   Expressive language characterized by halting and interrupted speech with significant word-finding difficulty, agrammatic speech and perseverations. Pt often used filler words and sounds, such as "um", when attempting to verbalize thoughts. Pt able to name objects around room with 80% accuracy. Object function description completed with ~60% accuracy.   Receptive language deficits characterized by reduced auditory comprehension of 2-3 steps. Mildly reduced comprehension of concrete and abstract Y/N questions. Comprehension does appear stronger than expression. Reading comprehension to be assessed.   No apparent dysarthria observed, however further speech assessment may be indicated to rule out motor speech impairment.   Pt left sitting upright in w/c with chair alarm activated and call bell in reach. Continue SLP PoC.    SLP Assessment  Patient will need skilled Speech Lanaguage Pathology Services during CIR admission    Recommendations  SLP Diet Recommendations: Age appropriate regular solids;Thin Liquid Administration via: Cup;Straw Medication Administration: Whole meds with liquid Supervision: Patient able to self feed (set up meal tray) Compensations: Lingual sweep for clearance of pocketing Postural Changes and/or Swallow Maneuvers: Seated upright 90 degrees Oral Care  Recommendations: Oral care BID Patient destination: Home Follow up Recommendations: Outpatient SLP;Home Health SLP;24 hour supervision/assistance Equipment Recommended: None recommended by SLP    SLP Frequency 1 to 3 out of 7 days   SLP Duration  SLP Intensity  SLP Treatment/Interventions 21-28 days  Minumum of 1-2 x/day, 30 to 90 minutes  Patient/family education;Therapeutic Activities;Cueing hierarchy;Functional tasks;Speech/Language facilitation;Multimodal communication approach    Pain Pain Assessment Pain Scale: 0-10 Pain Score: 0-No pain Faces Pain Scale: Hurts little more Pain Type: Acute pain Pain Location: Head Pain Descriptors / Indicators: Headache Patients Stated Pain Goal: 2 Pain Intervention(s): Medication (See eMAR) Multiple Pain Sites: No  Prior Functioning Cognitive/Linguistic Baseline: Within functional limits Type of Home: Apartment  Lives With: Spouse Available Help at Discharge: Available 24 hours/day Vocation: Full time employment  SLP Evaluation Cognition Arousal/Alertness: Awake/alert Orientation Level: Oriented X4  Comprehension Auditory Comprehension Overall Auditory Comprehension: Impaired Yes/No Questions: Impaired Basic Immediate Environment Questions: 75-100% accurate Complex Questions: 75-100% accurate Commands: Impaired One Step Basic Commands: 75-100% accurate Two Step Basic Commands: 50-74% accurate Multistep Basic Commands: 50-74% accurate Conversation: Simple EffectiveTechniques: Extra processing time;Repetition Visual Recognition/Discrimination Discrimination: Not tested Reading Comprehension Reading Status: Not tested Expression Expression Primary Mode of Expression: Verbal Verbal Expression Overall Verbal Expression: Impaired Initiation: Impaired Automatic Speech: Counting;Month of year Level of Generative/Spontaneous Verbalization: Word;Phrase Repetition: Impaired Level of Impairment: Sentence level Naming:  Impairment Responsive: 76-100% accurate (80%) Other Naming Comments: poor generative naming Verbal Errors: Phonemic paraphasias;Perseveration;Aware of errors Pragmatics: No impairment Effective Techniques: Sentence completion;Phonemic cues Written Expression Dominant Hand: Right Written Expression: Not tested Oral Motor Oral Motor/Sensory Function Overall Oral Motor/Sensory Function: Within functional limits Motor Speech Overall Motor Speech: Appears within functional limits for tasks assessed (further assessment needed to rule out apraxia of speech) Respiration: Within functional limits Phonation: Normal Resonance: Within functional limits Articulation: Within functional limitis Intelligibility: Intelligible Motor Planning:  (further assessment  needed)  Care Tool Care Tool Cognition Ability to hear (with hearing aid or hearing appliances if normally used Ability to hear (with hearing aid or hearing appliances if normally used): 0. Adequate - no difficulty in normal conservation, social interaction, listening to TV   Expression of Ideas and Wants Expression of Ideas and Wants: 2. Frequent difficulty - frequently exhibits difficulty with expressing needs and ideas   Understanding Verbal and Non-Verbal Content Understanding Verbal and Non-Verbal Content: 3. Usually understands - understands most conversations, but misses some part/intent of message. Requires cues at times to understand  Memory/Recall Ability Memory/Recall Ability : That he or she is in a hospital/hospital unit;Current season   PMSV Assessment  PMSV Trial Intelligibility: Intelligible  Bedside Swallowing Assessment General Date of Onset: 07/25/22 Previous Swallow Assessment: advanced to a regular diet on 07/31/22 Diet Prior to this Study: Regular;Thin liquids (Level 0) Temperature Spikes Noted: No Respiratory Status: Room air History of Recent Intubation: No Behavior/Cognition: Alert;Cooperative Oral Cavity -  Dentition: Adequate natural dentition Self-Feeding Abilities: Needs set up Patient Positioning: Upright in chair/Tumbleform Baseline Vocal Quality: Normal Volitional Swallow: Able to elicit  Oral Care Assessment Oral Assessment  (WDL): Within Defined Limits Lips: Symmetrical Teeth: Intact Tongue: Pink;Moist Mucous Membrane(s): Moist;Pink Saliva: Moist, saliva free flowing Level of Consciousness: Alert Is patient on any of following O2 devices?: None of the above Nutritional status: No high risk factors Oral Assessment Risk : Low Risk Ice Chips Ice chips: Not tested Thin Liquid Thin Liquid: Within functional limits Presentation: Cup Nectar Thick Nectar Thick Liquid: Not tested Honey Thick Honey Thick Liquid: Not tested Puree Puree: Within functional limits Presentation: Self Fed Solid Solid: Within functional limits Presentation: Self Fed Other Comments: trace residue in R buccal sulci, cleared with additional dry swallow BSE Assessment Risk for Aspiration Impact on safety and function: No limitations  Short Term Goals: Week 1: SLP Short Term Goal 1 (Week 1): Pt will follow 2-3 step commands with >75% accuracy given min cues. SLP Short Term Goal 2 (Week 1): Pt will utilize compensatory word-finding strategies to repair communication breakdowns with >50% accuracy given min cues. SLP Short Term Goal 3 (Week 1): Pt wil complete object description tasks with >75% accuracy given min cues.  Refer to Care Plan for Long Term Goals  Recommendations for other services: None   Discharge Criteria: Patient will be discharged from SLP if patient refuses treatment 3 consecutive times without medical reason, if treatment goals not met, if there is a change in medical status, if patient makes no progress towards goals or if patient is discharged from hospital.  The above assessment, treatment plan, treatment alternatives and goals were discussed and mutually agreed upon: by  patient  [name] 08/03/2022, 11:53 AM

## 2022-08-03 NOTE — Progress Notes (Signed)
PROGRESS NOTE   Subjective/Complaints:  No issues overnite , remains aphasic   ROS- limited by exp aphasia   Objective:   No results found. Recent Labs    08/01/22 0620 08/02/22 0504  WBC 8.2 8.6  HGB 14.7 14.5  HCT 44.5 44.2  PLT 229 238    Recent Labs    08/02/22 0504 08/03/22 0704  NA 137 141  K 5.3* 5.2*  CL 105 111  CO2 21* 20*  GLUCOSE 106* 99  BUN 70* 77*  CREATININE 3.24* 3.28*  CALCIUM 9.0 9.6     Intake/Output Summary (Last 24 hours) at 08/03/2022 0841 Last data filed at 08/03/2022 0749 Gross per 24 hour  Intake 237 ml  Output 500 ml  Net -263 ml         Physical Exam: Vital Signs Blood pressure (!) 135/90, pulse 73, temperature 98 F (36.7 C), temperature source Oral, resp. rate 17, height 5\' 9"  (1.753 m), weight 107.7 kg, SpO2 100 %.   General: No acute distress Mood and affect are appropriate Heart: Regular rate and rhythm no rubs murmurs or extra sounds Lungs: Clear to auscultation, breathing unlabored, no rales or wheezes Abdomen: Positive bowel sounds, soft nontender to palpation, nondistended Extremities: No clubbing, cyanosis, or edema Skin: No evidence of breakdown, no evidence of rash Neurologic:follows simple verbal commands , motor strength is 5/5 in Left and 0/5 right deltoid, bicep, tricep, grip, hip flexor, knee extensors, ankle dorsiflexor and plantar flexor Sensory exam normal sensation to light touch and proprioception in bilateral upper and lower extremities Cerebellar exam unable to do on right due to weakness  Musculoskeletal: Full range of motion in all 4 extremities. No joint swelling   Assessment/Plan: 1. Functional deficits which require 3+ hours per day of interdisciplinary therapy in a comprehensive inpatient rehab setting. Physiatrist is providing close team supervision and 24 hour management of active medical problems listed below. Physiatrist and rehab team  continue to assess barriers to discharge/monitor patient progress toward functional and medical goals  Care Tool:  Bathing    Body parts bathed by patient: Abdomen, Chest   Body parts bathed by helper: Right arm, Left arm, Front perineal area, Buttocks, Right upper leg, Left upper leg, Right lower leg, Left lower leg     Bathing assist Assist Level: Total Assistance - Patient < 25%     Upper Body Dressing/Undressing Upper body dressing   What is the patient wearing?: Pull over shirt    Upper body assist Assist Level: Maximal Assistance - Patient 25 - 49%    Lower Body Dressing/Undressing Lower body dressing      What is the patient wearing?: Pants, Incontinence brief     Lower body assist Assist for lower body dressing: Maximal Assistance - Patient 25 - 49%     Toileting Toileting    Toileting assist Assist for toileting: 2 Helpers     Transfers Chair/bed transfer  Transfers assist     Chair/bed transfer assist level: Maximal Assistance - Patient 25 - 49%     Locomotion Ambulation   Ambulation assist      Assist level: 2 helpers (attempted but unable to tolerate)  Walk 10 feet activity   Assist  Walk 10 feet activity did not occur: Safety/medical concerns        Walk 50 feet activity   Assist Walk 50 feet with 2 turns activity did not occur: Safety/medical concerns         Walk 150 feet activity   Assist Walk 150 feet activity did not occur: Safety/medical concerns         Walk 10 feet on uneven surface  activity   Assist Walk 10 feet on uneven surfaces activity did not occur: Safety/medical concerns         Wheelchair     Assist Is the patient using a wheelchair?: Yes Type of Wheelchair: Manual (TIS w/c)    Wheelchair assist level: Dependent - Patient 0%      Wheelchair 50 feet with 2 turns activity    Assist        Assist Level: Dependent - Patient 0%   Wheelchair 150 feet activity      Assist      Assist Level: Dependent - Patient 0%   Blood pressure (!) 135/90, pulse 73, temperature 98 F (36.7 C), temperature source Oral, resp. rate 17, height 5\' 9"  (1.753 m), weight 107.7 kg, SpO2 100 %.  Medical Problem List and Plan: 1. 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  Antithrombotics: -DVT/anticoagulation:  Pharmaceutical: Heparin             -antiplatelet therapy: Aspirin 81 mg daily and Brilinta 90 mg twice daily 3. Pain Management: Tylenol as needed 4. Mood/Behavior/Sleep: Provide emotional support             -antipsychotic agents: N/A 5. Neuropsych/cognition: This patient is not capable of making decisions on his own behalf. 6. Skin/Wound Care: Routine skin checks 7. Fluids/Electrolytes/Nutrition: Routine in and outs with follow-up chemistries 8.  Seizure prophylaxis.  EEG negative.  Continue Keppra 500 mg daily 9.  AKI on CKD stage IV.  Follow-up chemistries.  Follow-up outpatient nephrology services             -pt given lokelma prior to rehab admit             HyperK+ but drifting down on HCTZ Nephro to manage  10.  Hyperlipidemia.  Crestor 11.  Permissive hypertension.  Patient on amlodipine 10 mg daily, hydralazine 50 mg twice daily, labetalol 200 mg twice daily prior to admission.  Resume as needed Vitals:   08/02/22 2233 08/03/22 0516  BP:  (!) 135/90  Pulse: 76 73  Resp: 18 17  Temp:  98 F (36.7 C)  SpO2: 97% 100%    12.  Medical noncompliance.  Provide counseling    LOS: 2 days A FACE TO FACE EVALUATION WAS PERFORMED  Erick Colace 08/03/2022, 8:41 AM

## 2022-08-03 NOTE — Progress Notes (Signed)
Physical Therapy Session Note  Patient Details  Name: Joseph Bright MRN: [id number] Date of Birth: 09/25/1975  Today's Date: 08/03/2022 PT Individual Time: 0911-0952 PT Individual Time Calculation (min): 41 min  Short Term Goals: Week 1:  PT Short Term Goal 1 (Week 1): Pt will be able to perform mod assist transfers to the R PT Short Term Goal 2 (Week 1): Pt will be able to perform w/c propulsion with min assist x 50' PT Short Term Goal 3 (Week 1): Pt will be able to initiate gait training PT Short Term Goal 4 (Week 1): Pt will be able to perform bed mobility with min assist  Skilled Therapeutic Interventions/Progress Updates:    Pt presents in room in bed and agreeable to PT. Pt denies pain. Pt with improved yes/no accuracy this session but does require increased time for processing verbal commands and questions. Pt also able to repeat various words in context including "good morning" and therapist's name following introduction. Session focused on bed mobility, NMR for R side, attendance to R side, and transfer training.  Pt able to indicate brief was soiled when asked. Pt completes rolling bilaterally for doffing/donning new brief and periarea hygiene with focus on R sided activation for rolling to L side. Pt with noted improvement in initiating R trunk and RLE for rolling to L with repetition, continues to require max assist for positioning RLE in hip/knee flexion and managing RUE and shoulder. Pt able to roll to R side using hospital bed rail with CGA. Pt able to attempt bridging with assist for maintaining RLE in hip/knee flexion with foot planted on mattress during task, demonstrates L gluteal clearance but minimal activation on R glute initially. Pt then requires max assist for threading pants over BLEs, able to utilize LUE to pull up to hips and reattempts bridge with L gluteal clearance using LUE to pull up over L hip. Provided with verbal cue to "roll" to L side to engage R trunk with pt  demonstrating improved glute intiation on R side for bridge requiring max assist to pull pants up over R hip. Pt rolls to L side with max assist to complete donning pants over R hip. Therapist dependently dons socks and shoes in supine for time management and safety in sitting position.  Pt vitals assessed in supine position BP 137/17mmHg. Pt completes supine to sit with verbal cues for sequencing, mod assist for RLE management and trunk to upright. Pt requires min assist for static sitting balance progress to close supervision, requires min assist for sitting balance with dynamic UE tasks with pt losing balance to R. Pt dons shirt in sitting with min assist for postural stability and mod assist for assist with RUE into shirt, pt able to manage LUE into shirt, manage shirt over R elbow and over head with min assist for postural stability and verbal cues for sequencing. Pt then completes squat pivot to L with mod assist, places LUE on opposite WC armrest for transfer.  Once seated in WC pt vitals reassessed with BP 146/96 mmHg. Pt then participates with NMR for RLE, therapist provides verbal and tactile cues for LAQ x7 on RLE with therapist providing 100% active assist throughout ROM, no initiation noted. Therapist provides tactile cues to R quad, cues for looking at RLE, and education to pt on continued trying to move RLE for neural facilitation and carryover.  Pt remains seated in WC with all needs within reach, call light in place, and posey belt donned and activated  at end of session.    Therapy Documentation Precautions:  Precautions Precautions: Fall Precaution Comments: right sided hemiparesis, expressive deficits Restrictions Weight Bearing Restrictions: No   Therapy/Group: Individual Therapy  Edwin Cap PT, DPT 08/03/2022, 12:39 PM

## 2022-08-03 NOTE — Progress Notes (Signed)
Physical Therapy Session Note  Patient Details  Name: Joseph Bright MRN: [id number] Date of Birth: [birth year]  Today's Date: 08/03/2022 PT Individual Time: 1138-1205 and 1407-1505 PT Individual Time Calculation (min): 27 min and 58 min  Short Term Goals: Week 1:  PT Short Term Goal 1 (Week 1): Pt will be able to perform mod assist transfers to the R PT Short Term Goal 2 (Week 1): Pt will be able to perform w/c propulsion with min assist x 50' PT Short Term Goal 3 (Week 1): Pt will be able to initiate gait training PT Short Term Goal 4 (Week 1): Pt will be able to perform bed mobility with min assist  Skilled Therapeutic Interventions/Progress Updates:    Session 1: Pt received sitting in TIS wheelchair and agreeable to therapy session. Pt continues with expressive>receptive aphasia mostly responding with yes/no responses but not always accurate.   Vitals in w/c at beginning of session: BP 127/98 (MAP 108), HR 73bpm   Transported to/from gym in w/c for time management and energy conservation. Therapist adjusted placement of TIS w/c headrest and armrests for improved alignment/positioning in wheelchair.  R squat pivot transfer w/c>EOM with mod assist for lifting/pivoting hips while therapist managing R UE and R LE positioning throughout and cuing pt for proper head/hips relationship, therapist facilitating R LE WBing for NMR.  Sit>stand EOM>L UE support on w/c armrests x2 reps with mirror feedback and mod assist from therapist for blocking R knee and facilitating increased R hip/knee extension and facilitating R weight shift to promote WBing for NMR. Pt does not have any palpable quad activation and is able to compensate with strength in L hemibody.  After 2nd stand: BP 148/113 (MAP 125), HR 75bpm , no noticeable symptoms   Patient participated in Anthem Balance Test and demonstrates increased fall risk as noted by score of  4/56. (<36= high risk for falls, close to 100%; 37-45 significant  >80%; 46-51 moderate >50%; 52-55 lower >25%).  L squat pivot EOM>w/c with min assist while therapist manages R hemibody and promotes WBing through R LE for NMR.  Transported back to room and pt requesting to return to bed and rest. R squat pivot w/c>EOM as described above. Sit>supine with mod assist only for R hemibody management into the bed. Pt left supine in bed with needs in reach, R UE supported/positioned on pillows, and bed alarm on.   Session 2: Pt received supine in bed, awake and eager to participate in therapy session. Supine>sitting R EOB, HOB flat but using bedrial, with mod assist for R hemibody management and heavy min assist to bring trunk upright. Sitting EOB with good static trunk control donned tennis shoes total assist.   L squat pivot EOB>w/c with pt demoing great carryover of education/training earlier on head/hips relationship - light mod assist for lifting/pivoting hips and therapist blocking R knee to promote WBing for NMR - therapist manually facilitating R hemibody positioning during transfer.  Pt noted to be incontinent of bladder. Pt reports he is able to sense when needing to void - discussed plan with nursing staff to ensure pt has access to urinal between sessions and ensured nursing staff aware to report to pt's room when he presses call button due to his aphasia limiting his ability to communicate needs via call light system.  Sit>stand w/c>L UE support on sink with light mod assist guarding pt's R side, he is able to maintain balance with min assist having good awareness to compensate for deficits via L  weight shift during dependent LB clothing management and peri-care with +2 assist for time management.   Transported to/from gym in w/c for time management and energy conservation.  Vitals sitting in w/c in gym: BP 157/108 (MAP 123), HR 80bpm  Vitals after sitting for ~23minutes: BP 149/109 (MAP 121), HR 85bpm   Donned R LE ankle DF assist ACE wrap.  Initiated  donning litegait harness in sitting. Sit>stand w/c>L UE support on litegait handle with mod assist of 1 and standing with min assist, guarding R knee buckle, while completed donning litegait harness with +2 assist.   Gait training overground 83ft + 21ft (standing break between for vitals) using litegait to provide a moderate to maximal amount of body weight support with +2 assist to manage litegait. Therapist providing total assist to manually facilitate R LE placement during swing and stance phases of gait - no active movement noted in R LE at this time. During swing, therapist having to improve R LE alignment to avoid excessive hip external rotation and then during stance requires manual facilitation for knee extension and using black bungee cord at R hip to facilitate R hip extension and forward progression of pelvis throughout gait.  Vitals after 6ft, while standing: BP 141/113 (MAP 122), HR 107bpm  Vitals after additional 53ft: BP 160/109 (MAP 123), HR 96bpm with pt reporting he feels "great"  Doffed litegait harness as described above. Transported back to room and pt agreeable to remain sitting up - left with needs in reach, seat belt alarm on, and R UE supported on pillow.  Therapy Documentation Precautions:  Precautions Precautions: Fall Precaution Comments: right sided hemiparesis, expressive deficits Restrictions Weight Bearing Restrictions: No   Pain: Session 1: No reports of pain throughout session.  Session 2: No reports of pain throughout session.  Balance: Standardized Balance Assessment Standardized Balance Assessment: Berg Balance Test Berg Balance Test Sit to Stand: Needs moderate or maximal assist to stand Standing Unsupported: Unable to stand 30 seconds unassisted Sitting with Back Unsupported but Feet Supported on Floor or Stool: Able to sit 2 minutes under supervision Stand to Sit: Needs assistance to sit Transfers: Needs one person to assist Standing Unsupported  with Eyes Closed: Needs help to keep from falling Standing Ubsupported with Feet Together: Needs help to attain position and unable to hold for 15 seconds From Standing, Reach Forward with Outstretched Arm: Loses balance while trying/requires external support From Standing Position, Pick up Object from Floor: Unable to try/needs assist to keep balance From Standing Position, Turn to Look Behind Over each Shoulder: Needs assist to keep from losing balance and falling Turn 360 Degrees: Needs assistance while turning Standing Unsupported, Alternately Place Feet on Step/Stool: Needs assistance to keep from falling or unable to try Standing Unsupported, One Foot in Front: Loses balance while stepping or standing Standing on One Leg: Unable to try or needs assist to prevent fall Total Score: 4    Therapy/Group: Individual Therapy  Ginny Forth , PT, DPT, NCS, CSRS 08/03/2022, 7:56 AM

## 2022-08-03 NOTE — Progress Notes (Signed)
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 PMHx includes HTN, CKD IV, CVA  Subjective:  Patient is feeling well this morning, no complaints or concerns at this time  05/08 0701 - 05/09 0700 In: -  Out: 500 [Urine:500]  Filed Weights   08/01/22 1644  Weight: 107.7 kg    Scheduled Meds:  aspirin  81 mg Oral Daily   docusate sodium  100 mg Oral BID   feeding supplement (NEPRO CARB STEADY)  237 mL Oral BID BM   heparin  5,000 Units Subcutaneous Q8H   hydrochlorothiazide  25 mg Oral Daily   insulin aspart  0-15 Units Subcutaneous TID WC   levETIRAcetam  500 mg Oral Q2200   polyethylene glycol  17 g Oral Daily   rosuvastatin  20 mg Per Tube Daily   sodium bicarbonate  650 mg Oral TID   ticagrelor  90 mg Oral BID   Continuous Infusions: PRN Meds:.acetaminophen **OR** acetaminophen (TYLENOL) oral liquid 160 mg/5 mL **OR** acetaminophen  Current Labs: reviewed    Physical Exam:  Blood pressure (!) 135/90, pulse 73, temperature 98 F (36.7 C), temperature source Oral, resp. rate 17, height 5\' 9"  (1.753 m), weight 107.7 kg, SpO2 100 %. General: 48 year old male, sitting in chair chatting with visitor, NAD Cardio: Well-perfused Lungs: breathing comfortably on room air Extremities: No edema BLEs   Assessment   CKD IV: Cr at base line (~2.7-3.2)  3.28 today. BUN up trending, 38 (07/28/22)>60's>70>77 today. 24 hr UOP 500 mL with 2 unmeasured. He appears euvolemic on exam. 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 Had previously been placed on HCTZ and an ARB out patient but these meds were not on home med list in [facility]. Kidney function is currently stable at baseline and started back on HCTZ yesterday. Hyperkalemia: in setting of CKD IV. He has received a couple doses of loklema throughout admission. Started back on home HCTZ yesterday. K 5.2 this am which is expected in setting of CKD IV, goal of K  <5.5. 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 Continue HCTZ 25 mg daily and continue to hold arb in setting of hyperkalemia.  Will consider adding arb out patient.  Hyperkalemia: Continue HCTZ 25 mg daily, CTM labs CVA with deficits and other medical issues per PM&R  Medication Issues; Preferred narcotic agents for pain control are hydromorphone, fentanyl, and methadone. Morphine should not be used.  Baclofen should be avoided Avoid oral sodium phosphate and magnesium citrate based laxatives / bowel preps   Erick Alley, DO Family Medicine PGY-2  Recent Labs  Lab 07/27/22 1936 07/28/22 1610 08/01/22 0620 08/01/22 1657 08/02/22 0504  NA  --    < > 132* 137 137  K  --    < > 5.8* 4.7 5.3*  CL  --    < > 103 104 105  CO2  --    < > 18* 21* 21*  GLUCOSE  --    < > 95 100* 106*  BUN  --    < > 68* 69* 70*  CREATININE  --    < > 3.05* 3.13* 3.24*  CALCIUM  --    < > 9.1 9.0 9.0  PHOS 3.9  --   --   --   --    < > =  values in this interval not displayed.   Recent Labs  Lab 07/31/22 0342 08/01/22 0620 08/02/22 0504  WBC 9.4 8.2 8.6  NEUTROABS  --   --  5.7  HGB 14.6 14.7 14.5  HCT 43.1 44.5 44.2  MCV 91.9 93.9 94.2  PLT 232 229 238

## 2022-08-04 DIAGNOSIS — I6329 Cerebral infarction due to unspecified occlusion or stenosis of other precerebral arteries: Secondary | ICD-10-CM | POA: Diagnosis not present

## 2022-08-04 LAB — BASIC METABOLIC PANEL
Anion gap: 11 (ref 5–15)
BUN: 82 mg/dL — ABNORMAL HIGH (ref 6–20)
CO2: 18 mmol/L — ABNORMAL LOW (ref 22–32)
Calcium: 9.5 mg/dL (ref 8.9–10.3)
Chloride: 108 mmol/L (ref 98–111)
Creatinine, Ser: 3.56 mg/dL — ABNORMAL HIGH (ref 0.61–1.24)
GFR, Estimated: 20 mL/min — ABNORMAL LOW (ref >60)
Glucose, Bld: 107 mg/dL — ABNORMAL HIGH (ref 70–99)
Potassium: 5.5 mmol/L — ABNORMAL HIGH (ref 3.5–5.1)
Sodium: 137 mmol/L (ref 135–145)

## 2022-08-04 LAB — GLUCOSE, CAPILLARY
Glucose-Capillary: 100 mg/dL — ABNORMAL HIGH (ref 70–99)
Glucose-Capillary: 95 mg/dL (ref 70–99)
Glucose-Capillary: 73 mg/dL (ref 70–99)
Glucose-Capillary: 81 mg/dL (ref 70–99)

## 2022-08-04 MED ORDER — CHLORTHALIDONE 25 MG PO TABS
25.0000 mg | ORAL_TABLET | Freq: Every day | ORAL | Status: DC
  Administered 2022-08-04 – 2022-08-14 (×11): 25 mg via ORAL
  Filled 2022-08-04 (×11): qty 1

## 2022-08-04 MED ORDER — ROSUVASTATIN CALCIUM 20 MG PO TABS
20.0000 mg | ORAL_TABLET | Freq: Every day | ORAL | Status: DC
  Administered 2022-08-05 – 2022-08-31 (×27): 20 mg via ORAL
  Filled 2022-08-04 (×27): qty 1

## 2022-08-04 MED ORDER — SODIUM BICARBONATE 650 MG PO TABS
1300.0000 mg | ORAL_TABLET | Freq: Two times a day (BID) | ORAL | Status: DC
  Administered 2022-08-04 – 2022-08-31 (×55): 1300 mg via ORAL
  Filled 2022-08-04 (×55): qty 2

## 2022-08-04 MED ORDER — SORBITOL 70 % SOLN
15.0000 mL | Freq: Every day | Status: DC | PRN
  Administered 2022-08-04: 15 mL via ORAL
  Filled 2022-08-04: qty 30

## 2022-08-04 NOTE — Progress Notes (Signed)
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 PMHx includes HTN, CKD IV, CVA   Subjective:  Patient doing well this morning, no complaints or concerns at this time    [facility] Weights   08/01/22 1644  Weight: 107.7 kg    Scheduled Meds:  aspirin  81 mg Oral Daily   docusate sodium  100 mg Oral BID   feeding supplement (NEPRO CARB STEADY)  237 mL Oral BID BM   heparin  5,000 Units Subcutaneous Q8H   hydrochlorothiazide  25 mg Oral Daily   insulin aspart  0-15 Units Subcutaneous TID WC   levETIRAcetam  500 mg Oral Q2200   polyethylene glycol  17 g Oral Daily   rosuvastatin  20 mg Per Tube Daily   sodium bicarbonate  1,300 mg Oral BID   ticagrelor  90 mg Oral BID   Continuous Infusions: PRN Meds:.acetaminophen **OR** acetaminophen (TYLENOL) oral liquid 160 mg/5 mL **OR** acetaminophen  Current Labs: reviewed    Physical Exam:  Blood pressure (!) 135/97, pulse 74, temperature 98.4 F (36.9 C), resp. rate 18, height 5\' 9"  (1.753 m), weight 107.7 kg, SpO2 100 %. General: Patient sitting in chair, watching television, NAD Cardio: Well-perfused Respiratory: Breathing comfortably on room air Extremities: No edema BLEs  Assessment   CKD IV: Cr around base line (~2.7-3.2)  3.56 today. BUN up trending, 38 (07/28/22)>60's>70>77 today. 24 hr UOP 250 mL with 5 unmeasured. He appears euvolemic on exam. 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 Had previously been placed on HCTZ and an ARB out patient but these meds were not on home med list in [facility]. Kidney function is currently stable at baseline and started back on HCTZ 5/7. Hyperkalemia: in setting of CKD IV. He has received a couple doses of loklema throughout admission. Started back on home HCTZ 5/7. K 5.3>5.5 this am with goal of K <5.5. 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 CTM CVA with deficits and other medical issues per PM&R  Medication Issues; Preferred narcotic agents for pain control are hydromorphone, fentanyl, and methadone. Morphine should not be used.  Baclofen should be avoided Avoid oral sodium phosphate and magnesium citrate based laxatives / bowel preps    Erick Alley, DO Family Medicine, PGY-2  Recent Labs  Lab 08/02/22 0504 08/03/22 0704 08/04/22 0600  NA 137 141 137  K 5.3* 5.2* 5.5*  CL 105 111 108  CO2 21* 20* 18*  GLUCOSE 106* 99 107*  BUN 70* 77* 82*  CREATININE 3.24* 3.28* 3.56*  CALCIUM 9.0 9.6 9.5   Recent Labs  Lab 07/31/22 0342 08/01/22 0620 08/02/22 0504  WBC 9.4 8.2 8.6  NEUTROABS  --   --  5.7  HGB 14.6 14.7 14.5  HCT 43.1 44.5 44.2  MCV 91.9 93.9 94.2  PLT 232 229 238

## 2022-08-04 NOTE — Progress Notes (Signed)
PROGRESS NOTE   Subjective/Complaints:  No issues overnite , remains aphasic  Answers simple questions  K+ creeping up   ROS- limited by exp aphasia   Objective:   No results found. Recent Labs    08/02/22 0504  WBC 8.6  HGB 14.5  HCT 44.2  PLT 238    Recent Labs    08/03/22 0704 08/04/22 0600  NA 141 137  K 5.2* 5.5*  CL 111 108  CO2 20* 18*  GLUCOSE 99 107*  BUN 77* 82*  CREATININE 3.28* 3.56*  CALCIUM 9.6 9.5     Intake/Output Summary (Last 24 hours) at 08/04/2022 0857 Last data filed at 08/04/2022 0720 Gross per 24 hour  Intake 710 ml  Output 250 ml  Net 460 ml         Physical Exam: Vital Signs Blood pressure (!) 135/97, pulse 74, temperature 98.4 F (36.9 C), resp. rate 18, height 5\' 9"  (1.753 m), weight 107.7 kg, SpO2 100 %.   General: No acute distress Mood and affect are appropriate Heart: Regular rate and rhythm no rubs murmurs or extra sounds Lungs: Clear to auscultation, breathing unlabored, no rales or wheezes Abdomen: Positive bowel sounds, soft nontender to palpation, nondistended Extremities: No clubbing, cyanosis, or edema Skin: No evidence of breakdown, no evidence of rash, calluses on knees  Neurologic:follows simple verbal commands , motor strength is 5/5 in Left and 0/5 right deltoid, bicep, tricep, grip, hip flexor, knee extensors, ankle dorsiflexor and plantar flexor Sensory exam normal sensation to light touch and proprioception in bilateral upper and lower extremities Cerebellar exam unable to do on right due to weakness  Musculoskeletal: Full range of motion in all 4 extremities. No joint swelling   Assessment/Plan: 1. Functional deficits which require 3+ hours per day of interdisciplinary therapy in a comprehensive inpatient rehab setting. Physiatrist is providing close team supervision and 24 hour management of active medical problems listed below. Physiatrist and  rehab team continue to assess barriers to discharge/monitor patient progress toward functional and medical goals  Care Tool:  Bathing    Body parts bathed by patient: Abdomen, Chest   Body parts bathed by helper: Right arm, Left arm, Front perineal area, Buttocks, Right upper leg, Left upper leg, Right lower leg, Left lower leg     Bathing assist Assist Level: Total Assistance - Patient < 25%     Upper Body Dressing/Undressing Upper body dressing   What is the patient wearing?: Pull over shirt    Upper body assist Assist Level: Maximal Assistance - Patient 25 - 49%    Lower Body Dressing/Undressing Lower body dressing      What is the patient wearing?: Pants, Incontinence brief     Lower body assist Assist for lower body dressing: Maximal Assistance - Patient 25 - 49%     Toileting Toileting    Toileting assist Assist for toileting: 2 Helpers     Transfers Chair/bed transfer  Transfers assist     Chair/bed transfer assist level: Maximal Assistance - Patient 25 - 49%     Locomotion Ambulation   Ambulation assist      Assist level: 2 helpers (attempted but unable to  tolerate)       Walk 10 feet activity   Assist  Walk 10 feet activity did not occur: Safety/medical concerns        Walk 50 feet activity   Assist Walk 50 feet with 2 turns activity did not occur: Safety/medical concerns         Walk 150 feet activity   Assist Walk 150 feet activity did not occur: Safety/medical concerns         Walk 10 feet on uneven surface  activity   Assist Walk 10 feet on uneven surfaces activity did not occur: Safety/medical concerns         Wheelchair     Assist Is the patient using a wheelchair?: Yes Type of Wheelchair: Manual (TIS w/c)    Wheelchair assist level: Dependent - Patient 0%      Wheelchair 50 feet with 2 turns activity    Assist        Assist Level: Dependent - Patient 0%   Wheelchair 150 feet activity      Assist      Assist Level: Dependent - Patient 0%   Blood pressure (!) 135/97, pulse 74, temperature 98.4 F (36.9 C), resp. rate 18, height 5\' 9"  (1.753 m), weight 107.7 kg, SpO2 100 %.  Medical Problem List and Plan: 1. 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  Antithrombotics: -DVT/anticoagulation:  Pharmaceutical: Heparin             -antiplatelet therapy: Aspirin 81 mg daily and Brilinta 90 mg twice daily 3. Pain Management: Tylenol as needed 4. Mood/Behavior/Sleep: Provide emotional support             -antipsychotic agents: N/A 5. Neuropsych/cognition: This patient is not capable of making decisions on his own behalf. 6. Skin/Wound Care: Routine skin checks 7. Fluids/Electrolytes/Nutrition: Routine in and outs with follow-up chemistries 8.  Seizure prophylaxis.  EEG negative.  Continue Keppra 500 mg daily 9.  AKI on CKD stage IV.  Follow-up chemistries.  Follow-up outpatient nephrology services             -pt given lokelma prior to rehab admit             HyperK+     Latest Ref Rng & Units 08/04/2022    6:00 AM 08/03/2022    7:04 AM 08/02/2022    5:04 AM  BMP  Glucose 70 - 99 mg/dL 161  99  096   BUN 6 - 20 mg/dL 82  77  70   Creatinine 0.61 - 1.24 mg/dL 0.45  4.09  8.11   Sodium 135 - 145 mmol/L 137  141  137   Potassium 3.5 - 5.1 mmol/L 5.5  5.2  5.3   Chloride 98 - 111 mmol/L 108  111  105   CO2 22 - 32 mmol/L 18  20  21    Calcium 8.9 - 10.3 mg/dL 9.5  9.6  9.0     Nephro to manage  10.  Hyperlipidemia.  Crestor 11.  Permissive hypertension.  Patient on amlodipine 10 mg daily, hydralazine 50 mg twice daily, labetalol 200 mg twice daily prior to admission.  Resume as needed Vitals:   08/03/22 2011 08/04/22  0318  BP: (!) 149/99 (!) 135/97  Pulse: 71 74  Resp: 17 18  Temp: 97.9 F (36.6 C)  98.4 F (36.9 C)  SpO2: 100% 100%    12.  Medical noncompliance.  Provide counseling    LOS: 3 days A FACE TO FACE EVALUATION WAS PERFORMED  Joseph Bright 08/04/2022, 8:57 AM

## 2022-08-04 NOTE — Progress Notes (Signed)
Speech Language Pathology Daily Session Note  Patient Details  Name: Joseph Bright MRN: [id number] Date of Birth: 1975/04/01  Today's Date: 08/04/2022 SLP Individual Time: 1015-1055 SLP Individual Time Calculation (min): 40 min  Short Term Goals: Week 1: SLP Short Term Goal 1 (Week 1): Pt will follow 2-3 step commands with >75% accuracy given min cues. SLP Short Term Goal 2 (Week 1): Pt will utilize compensatory word-finding strategies to repair communication breakdowns with >50% accuracy given min cues. SLP Short Term Goal 3 (Week 1): Pt wil complete object description tasks with >75% accuracy given min cues.  Skilled Therapeutic Interventions:   Pt seen for skilled SLP session to address expressive and receptive language goal. Addressed simple reading comprehension and verbal expression with menu selection task. Set up scenario as if pt was ordering a meal at a restaurant. He is challenged with verbally responding when presented with open-ended questions. Efficient verbal responses observed when pt given options f=2-3.  Pt completed basic auditory comprehension task of object ID with 100% accuracy f=5 objects. Challenged with comprehension of moderate level directions such as "put the pen on top of the socks". Pt selected objects appropriately, though challenged with directional words/phrases. Improved comprehension noted with written direction+verbal direction. Pt left sitting upright in w/c with chair alarm in place and call bell in reach. Recommend continue SLP PoC.   Pain Pain Assessment Pain Scale: 0-10 Pain Score: 0-No pain  Therapy/Group: Individual Therapy  [name] 08/04/2022, 11:01 AM

## 2022-08-04 NOTE — Progress Notes (Signed)
Patient refusing cpap at this time

## 2022-08-04 NOTE — Progress Notes (Signed)
Physical Therapy Session Note  Patient Details  Name: Joseph Bright MRN: [id number] Date of Birth: 1975-09-07  Today's Date: 08/04/2022 PT Individual Time: 4098-1191 + 4782-9562 PT Individual Time Calculation (min): 55 min  + 56 min  Short Term Goals: Week 1:  PT Short Term Goal 1 (Week 1): Pt will be able to perform mod assist transfers to the R PT Short Term Goal 2 (Week 1): Pt will be able to perform w/c propulsion with min assist x 50' PT Short Term Goal 3 (Week 1): Pt will be able to initiate gait training PT Short Term Goal 4 (Week 1): Pt will be able to perform bed mobility with min assist  Skilled Therapeutic Interventions/Progress Updates:      1st session: Pt in bed to start - no reports of pain and agreeable to therapy. Disconnected male purewick and assisted with donning athletic shorts at bed level with maxA - attempted via bridging and pt unable to fully clear hips on his weaker R side .  HOB flat, patient needing minA for supine<>sitting to his R side - used hospital bed rails to assist with rolling and pushing himself to upright. Donned tennis shoes with totalA. Completed squat<>pivot transfer with min/modA towards his stronger L side from EOB to TIS w/c. Assisted with repositioning and then transported to main rehab gym.  Facilitated NMR with gait training using L hand rail and +2 assist for w/c follow. Pt ambulated 3x57ft (seated rest) with the above described setup with mod/maxA from PT. His R arm draped over therapist's shoulder and totalA for advancing/placing RLE in gait cycle. Needed to monitor foot inversion on R with high risk of rolling. Cues needed for slowing down stepping on L to promote weight bearing and stance phase on R.   Remainder of session focused on heel cord stretching in // bars by standing on blue (medium) sized foam wedge. ModA for safely stepping on/off the foam. Patient immediately feeling stretch, grimacing with both soleus and gastroc stretching.  PT needing to facilitate knee extension to achieve gastroc stretch. MinA overall for standing balance on wedge. Also needed close monitoring of R foot due to risk of inversion and rolling his ankle - may benefit from a brace to control this - will relay to primary team.   Returned to his room and concluded session reclined in TIS with safety belt alarm on, pillow for RUE, all needs met with call bell in lap.    2nd session: Direct handoff of care from OT to start with patient sitting EOM - fair sitting balance with no LOB in unsupported sitting.   Assisted to supine with modA for trunk support and RLE management. Worked on NMR to address bed mobility, core strengthening, and functional mobility.   -1x10 rolling to his R with supervision -1x10 rolling to his R with resistance for both forward/backward directions -2x10 rolling to his L with initially modA but progressing to minA for RLE management only! Very effortful and tiring -1x5 supine<>sitting on edge of mat table - minA overall for technique and for RLE management. Max cues for log rolling technique and patient able to push his trunk upright with a few failed attempts for each try.   -continued NMR with perched position on edge of mat for weight bearing purposes - reaching outside BOS towards his weaker R side to cones and bringing them back to ipsilateral side. MinA needed at times due to LOB R. -Repeated sit<>stands with mirror for visual feedback and tray  table in front of him for UE support. Used red TB to apply resistance, pulling him back into sitting, for each stand. Guarding on his R due to R trunk lean and R knee buckling.  -Finished session with repeated squat<>pivot transfers from mat table to TIS w/c towards both directions modA overall for safety as patient struggles clearing the gap with poor trunk control during transfer.   Pt returned to his room and requesting to lie down due to fatigue. Assisted to bed via modA squat pivot  transfer and assist for returning to supine and repositioning higher in the bed. All needs met with alarm on, call bell in reach.   Therapy Documentation Precautions:  Precautions Precautions: Fall Precaution Comments: right sided hemiparesis, expressive deficits Restrictions Weight Bearing Restrictions: No General:      Therapy/Group: Individual Therapy  Brianda Beitler P Rogue Rafalski  PT, DPT, CSRS  08/04/2022, 7:57 AM

## 2022-08-04 NOTE — Progress Notes (Addendum)
Occupational Therapy Session Note  Patient Details  Name: Joseph Bright MRN: [id number] Date of Birth: Feb 13, 1976  Today's Date: 08/04/2022 OT Individual Time: 1300-1345 OT Individual Time Calculation (min): 45 min   OT Individual Time: 4098-1191 OT Individual Time Calculation (min):  53 min   Short Term Goals: Week 1:  OT Short Term Goal 1 (Week 1): PT will transfer to the toilet/ BSC with mod A in both directions OT Short Term Goal 2 (Week 1): Pt will don shirt wtih mod A  unsupported. OT Short Term Goal 3 (Week 1): Pt will perform bed mobility to come to EOB in prep for ADLs- with mod A consistency OT Short Term Goal 4 (Week 1): Pt will perform sit to stands with min A consistency for clothing management.  Skilled Therapeutic Interventions/Progress Updates:     Session 1:  Pt received in bed presenting to be in good spirits and receptive to skilled OT session reporting 0/10 pain. Focus this session functional transfers, BADL retraining, and RUE NMR. Nursing staff present in room upon OT arrival assisting Pt with peri-care and clothing management following continent void in bed. During nursing care, OT facilitating RLE engagement during rolling in bed min A to R side and mod A to L side. Threaded Pt's BLEs into pants for time management. Pt able to complete bridges x2 with OT stabilizing RLE to bring pants to waist. Per PT, Pt reporting he would like to be more independent in using restroom during previous session. Spoke with Pt this session to confirm with Pt reporting he wants to be able to urinate independently vs waiting for nursing staff. Pt continent and aware of need to void. Provided education on set-up for urinal to prevent spillage with demonstration provided. Pt able to independently doff brief and set-up urinal following education following education demonstrating teach back as evidence of learning. Pt transitioned to EOB supine>sitting mod A to bring RLE off bed and fully lift  trunk. Squat pivot to L side light mod A with OT positioning RLE to prevent injury. Transported Pt total A in TISWC to therapy gym for time management. TISWC>EOM to R side light mod A with VB cues provided for technique and safety. Sitting EOM, engaged pt in AAROM exercises with therapist providing mod-max A to move through full ROM to prevent onset of tone and maintain muscle length. Utilized UE-ranger to act as active assist during pushing/pulling and shoulder circumduction movements with beginning stages of deltoid, tricep, and rotator cuff activation noted. Pt handed off to PT in therapy gym at end of session.   Session 2:  Pt received lightly sleeping in bed presenting to be in good spirits and receptive to skilled OT session reporting 0/10 pain. Pt reporting fatigue, however motivated to participate in therapy session. Beginning of session focused on working on bed mobility to increase Pt independence and decrease overall caregiver bourdon. Pt able to roll to R this session with min A using bed features. Educated Pt on modified technique of bringing RLE off EOB by hooking his LLE around it with Pt able to partially bring leg off bed requiring min A to fully bring leg to EOB d/t fatigue. Pt able to utilize LUE to lift trunk with OT proving min A. Pt squat pivot to R to TISWC light mod A. Pt transported total A to therapy gym in [facility]. TISWC>EOM to R side mod A with mod cues for technique and safety. Sitting EOM, engaged pt in dynamic sitting balance, core strengthening,  and RUE weight bearing activities to increase RUE active use and trunk control during BADLs. Pt with noted improvement maintain static sitting balance with supervision and dynamic sitting balance min A when lifting trunk to midline following inferior and later reaching to R side. Improved activation of tricep this session when straightening RUE during weightbearing activities. Pt fatigued at end of session requesting to return to bed. EOM>WC  to R side mod A. Transported Pt back to room in wc total A. TISWC>EOB>supine mod A. Pt left resting in bed with pants doffed to increase independence in urinal use, call bell in reach, bed alarm on, and all needs met.    Therapy Documentation Precautions:  Precautions Precautions: Fall Precaution Comments: right sided hemiparesis, expressive deficits Restrictions Weight Bearing Restrictions: No  Therapy/Group: Individual Therapy  Army Fossa 08/04/2022, 3:30 PM

## 2022-08-05 DIAGNOSIS — I6329 Cerebral infarction due to unspecified occlusion or stenosis of other precerebral arteries: Secondary | ICD-10-CM | POA: Diagnosis not present

## 2022-08-05 DIAGNOSIS — K5901 Slow transit constipation: Secondary | ICD-10-CM

## 2022-08-05 LAB — GLUCOSE, CAPILLARY
Glucose-Capillary: 85 mg/dL (ref 70–99)
Glucose-Capillary: 110 mg/dL — ABNORMAL HIGH (ref 70–99)
Glucose-Capillary: 105 mg/dL — ABNORMAL HIGH (ref 70–99)
Glucose-Capillary: 111 mg/dL — ABNORMAL HIGH (ref 70–99)

## 2022-08-05 LAB — BASIC METABOLIC PANEL
Anion gap: 13 (ref 5–15)
BUN: 86 mg/dL — ABNORMAL HIGH (ref 6–20)
CO2: 20 mmol/L — ABNORMAL LOW (ref 22–32)
Calcium: 9.5 mg/dL (ref 8.9–10.3)
Chloride: 106 mmol/L (ref 98–111)
Creatinine, Ser: 3.64 mg/dL — ABNORMAL HIGH (ref 0.61–1.24)
GFR, Estimated: 20 mL/min — ABNORMAL LOW (ref >60)
Glucose, Bld: 99 mg/dL (ref 70–99)
Potassium: 5.1 mmol/L (ref 3.5–5.1)
Sodium: 139 mmol/L (ref 135–145)

## 2022-08-05 NOTE — Progress Notes (Signed)
Orthopedic Tech Progress Note Patient Details:  Joseph Bright 1975-10-13 [id number]  Ortho Devices Type of Ortho Device: ASO Ortho Device/Splint Location: RLE Ortho Device/Splint Interventions: Ordered, Application   Post Interventions Patient Tolerated: Well Instructions Provided: Care of device, Adjustment of device  Joseph Bright [name] 08/05/2022, 7:24 PM

## 2022-08-05 NOTE — Progress Notes (Signed)
Occupational Therapy Session Note  Patient Details  Name: Joseph Bright MRN: [id number] Date of Birth: June 19, 1975  Today's Date: 08/05/2022 OT Individual Time: 4098-1191 OT Individual Time Calculation (min): 40 min    Short Term Goals: Week 1:  OT Short Term Goal 1 (Week 1): PT will transfer to the toilet/ BSC with mod A in both directions OT Short Term Goal 2 (Week 1): Pt will don shirt wtih mod A  unsupported. OT Short Term Goal 3 (Week 1): Pt will perform bed mobility to come to EOB in prep for ADLs- with mod A consistency OT Short Term Goal 4 (Week 1): Pt will perform sit to stands with min A consistency for clothing management.  Skilled Therapeutic Interventions/Progress Updates:     Pt received sitting up in [facility] with family present in room presenting to be in good spirits and receptive to skilled OT session reporting 0/10 pain. Pt requesting to use restroom at beginning of session. Transported total A to bathroom in wc. Educated Pt on stand pivot technique to [facility] using grab bar with Pt able to complete light mod A with OT blocking RLE to maintain alignment and prevent R knee from buckling. Pt maintained standing balance with LUE supported on grab bar min A while OT doffed pants total A. Pt noted to be soiled in urine seeping through onto his pants and bottom of his shirt. Pt doffed shirt with CGA and pants mod A. Provided Pt wash cloths with Pt able to complete UB bathing with min A to wash RUE and LB bathing mod A to wash lower BLEs d/t dynamic sitting balance challenges. Reviewed hemi-dressing techniques with Pt with pt able to recall first step with min questioning cues. Required assistance to don RUE and bring shirt over his head. Pants donned max A to weave BLEs and bring pants to waist in standing. Stand pivot elevated toilet seat>wc using grab bar mod a with OT supporting RLE. Remainder of session focused on R hemibody NMR with weight bearing facilitation to increase muscle  engagement and return. Positioned Pt at sink and worked on completing sit<>stands, maintaining standing positioning, and weight bearing through RUE. Pt able to complete sit<>stands x6 with min A with BUE supported on sink and OT blocking R knee providing tactile cues for muscle engagement. While in stand, completed modified push ups with OT stabilizing RUE on sink with noted improvement in tricep activation this session. Pt fatigued following with seated rest break provided. Engaged Pt in RUE AAROM functional movements to simulate ADLs with Pt reaching towards his mouth and then to his shoulder while OT provided max hand under hand assistance. Utilized visual imagery of eating and bathing to increase activation of familiar movement patterns. Pt noted to have increased digit flexion this session moving through partial ROM of beginning stages of grasp. Pt left resting in TISWC at end of session with call bell in reach, seat belt alarm on, and all needs met.   Therapy Documentation Precautions:  Precautions Precautions: Fall Precaution Comments: right sided hemiparesis, expressive deficits Restrictions Weight Bearing Restrictions: No  Therapy/Group: Individual Therapy  Army Fossa 08/05/2022, 12:59 PM

## 2022-08-05 NOTE — Progress Notes (Signed)
Physical Therapy Session Note  Patient Details  Name: Joseph Bright MRN: [id number] Date of Birth: 01-19-1976  Today's Date: 08/05/2022 PT Individual Time: 8469-6295 PT Individual Time Calculation (min): 46 min   Short Term Goals: Week 1:  PT Short Term Goal 1 (Week 1): Pt will be able to perform mod assist transfers to the R PT Short Term Goal 2 (Week 1): Pt will be able to perform w/c propulsion with min assist x 50' PT Short Term Goal 3 (Week 1): Pt will be able to initiate gait training PT Short Term Goal 4 (Week 1): Pt will be able to perform bed mobility with min assist  Skilled Therapeutic Interventions/Progress Updates:  Patient greeted sitting in TIS wheelchair in his room and agreeable to PT treatment session. Patient wheeled to/from the rehab gym for time management. Patient performed squat pivot transfer from TIS wheelchair to mat table (to the R) with ModA and good effort noted throughout. While sitting EOM, patient demonstrated good sitting balance with no LOB noted and therapist at time providing distant supv.   Patient performed various sit/stands throughout treatment session without the use of an AD and Min/ModA- Patient with improved success and ability when therapist provided a downward and out force on R LE throughout stand for improved WB and stability.   Patient performed various standing activities in order to improve standing balance and facilitate proprioceptive input of R LE (therapist donned R anterior AFO prior to standing activities). Mirror placed in front of patient for all activities in order to provide external visual cues regarding his posturing-  -Stood with L UE support on TIS wheelchair handle while therapist provided Min/ModA and blocking of R knee for improved stability and TKE. Patient then performed lateral weight shifts x10 total with decreased R lateral weight shift secondary to R knee buckling with increased WB requiring increased support from  therapist.  -Patient stood without UE support while reaching to his L for bean bags off a tray table and then tossed them into a basket across his body on his R in order to facilitate improved R weight acceptance. Therapist provided Min/ModA with activity and provided multimodal cues for improved postural extension and midline posturing, as well as R TKE. Patient completed 2 x 18 with extended seated rest break in between.   Patient returned to his room where he transferred from [facility] wheelchair to sitting EOB (to the R) via squat pivot and ModA. Patient then transitioned to supine with MinA for R LE management. Patient left supine in bed with call bell within reach, bed alarm on and all needs met.    Therapy Documentation Precautions:  Precautions Precautions: Fall Precaution Comments: right sided hemiparesis, expressive deficits Restrictions Weight Bearing Restrictions: No  Pain: No/Denies pain.   Therapy/Group: Individual Therapy  Joseph Bright 08/05/2022, 7:41 AM

## 2022-08-05 NOTE — Progress Notes (Signed)
Speech Language Pathology Daily Session Note  Patient Details  Name: Joseph Bright MRN: [id number] Date of Birth: 09-Nov-1975  Today's Date: 08/05/2022 SLP Individual Time: 0830-0930 SLP Individual Time Calculation (min): 60 min  Short Term Goals: Week 1: SLP Short Term Goal 1 (Week 1): Pt will follow 2-3 step commands with >75% accuracy given min cues. SLP Short Term Goal 2 (Week 1): Pt will utilize compensatory word-finding strategies to repair communication breakdowns with >50% accuracy given min cues. SLP Short Term Goal 3 (Week 1): Pt wil complete object description tasks with >75% accuracy given min cues.  Skilled Therapeutic Interventions: Skilled intervention focused on expressive language. Pt completed object description task with min to no A for single sentence responses but needed cues to elaborate on responses and produced 2+ details. He identified sentences to match pictures in a field of 2 with 80% accuracy. Picture naming completed with 90% accuracy. He formulated sentences for words with mod Assistance. Cont therapy per plan of care.      Pain Pain Assessment Pain Scale: Faces Pain Score: 0-No pain  Therapy/Group: Individual Therapy  [name] 08/05/2022, 9:05 AM

## 2022-08-05 NOTE — Progress Notes (Signed)
Placed patient on CPAP for the night.  

## 2022-08-05 NOTE — Progress Notes (Signed)
Physical Therapy Session Note  Patient Details  Name: Joseph Bright MRN: [id number] Date of Birth: 03/30/1975  Today's Date: 08/05/2022 PT Individual Time: 1000-1057 PT Individual Time Calculation (min): 57 min   Short Term Goals: Week 1:  PT Short Term Goal 1 (Week 1): Pt will be able to perform mod assist transfers to the R PT Short Term Goal 2 (Week 1): Pt will be able to perform w/c propulsion with min assist x 50' PT Short Term Goal 3 (Week 1): Pt will be able to initiate gait training PT Short Term Goal 4 (Week 1): Pt will be able to perform bed mobility with min assist  Skilled Therapeutic Interventions/Progress Updates:    Pt received supine in bed awake and agreeable to therapy session. Pt still wearing male purewick - removed and noted bed soiled - nursing staff notified to address linen change. Noticed in supine pt's R LE resting in excessive hip external rotation, hip flexion, and knee flexion with pt confirming some muscle spasticity - will discuss with primary MD next week to allow a few days to monitor tone change/progression.  Rolling R/L in bed with only assistance for R hemibody management and min assist to roll towards L with min assist during dependent LB clothing management and peri-care. Supine>sitting R EOB, HOB flat and bedrail available with assist only for R hemibody management and min assist for trunk upright. Sitting EOB donned shirt, shorts, and shoes total assist for time management with reinforcement of education to thread on R side first.   Sit>stand EOB>L UE support on w/c armrest with light mod assist and guarding/blocking R knee but pt compensates by keeping weight shifted onto L LE - light mod assist for balance while pt pulled sorts up over hips without assist. L partial stand pivot to w/c light mod assist.   Transported to/from gym in w/c for time management and energy conservation.  Donnred  RLE Matrix MAX GRAFO to protect R ankle and cue for R knee  extension during stance. Vitals sitting in w/c at gym: BP 143/100 (MAP 112), HR 98bpm   Gait training 84ftx2 using L UE support on L hallway rail with heavy mod/light max assist of 1 for balance and RLE management with +2 w/c follow. Pt demonstrating the following gait deviations with therapist providing the described cuing and facilitation for improvement:  - requires total assist to advance R LE during swing (therapist using the AFO to assist with limb advancement) - requires total assist to block R knee buckle during stance with pt starting to demo trace activation in quads but unable to sustain  - manual facilitation for L weight shifting onto L stance to allow R swing advancement - pt "hopping" over R stance phase due to pt not trusting that LE,  cuing and facilitation for R weight shift onto R stance to improve  Vitals after gait:  1st trial: BP 154/106 (MAP 121), HR 109bpm  2nd trial: BP 148/91 (MAP 109), HR 99bpm   Transitioned to gait training 37ft with +2 providing L HHA and therapist providing max assist for balance and R LE management as described above - pt with decreased ability to compensate and "hop" over R stance phase when not using hallway rail - pt continues to lack activation in R LE.   Vitals after final gait: BP 145/105 (MAP 116), HR 106bpm   Transported back to room and pt agreeable to remain sitting up in w/c - left with needs in reach and seat  belt alarm on.   Therapy Documentation Precautions:  Precautions Precautions: Fall Precaution Comments: right sided hemiparesis, expressive deficits Restrictions Weight Bearing Restrictions: No   Pain: No reports or indications of pain throughout session.    Therapy/Group: Individual Therapy  Ginny Forth , PT, DPT, NCS, CSRS 08/05/2022, 7:55 AM

## 2022-08-05 NOTE — Progress Notes (Signed)
PROGRESS NOTE   Subjective/Complaints:  Slept well, denies pain. LBM 2 days ago, and states he usually goes daily but wants to give it a little more time. Urinating well. Denies any other complaints or concerns today.   ROS- denies CP, SOB, abd pain, n/v/d, or other complaints, but perhaps slightly limited by exp aphasia   Objective:   No results found. No results for input(s): "WBC", "HGB", "HCT", "PLT" in the last 72 hours.  Recent Labs    08/04/22 0600 08/05/22 0607  NA 137 139  K 5.5* 5.1  CL 108 106  CO2 18* 20*  GLUCOSE 107* 99  BUN 82* 86*  CREATININE 3.56* 3.64*  CALCIUM 9.5 9.5    Intake/Output Summary (Last 24 hours) at 08/05/2022 1253 Last data filed at 08/05/2022 0745 Gross per 24 hour  Intake 480 ml  Output 225 ml  Net 255 ml        Physical Exam: Vital Signs Blood pressure (!) 125/93, pulse 87, temperature 97.9 F (36.6 C), temperature source Oral, resp. rate 18, height 5\' 9"  (1.753 m), weight 107.7 kg, SpO2 100 %.   General: No acute distress, laying in bed, doing speech therapy Mood and affect are appropriate Heart: Regular rate and rhythm no rubs murmurs or extra sounds Lungs: Clear to auscultation, breathing unlabored, no rales or wheezes Abdomen: Positive bowel sounds, soft nontender to palpation, nondistended Extremities: No clubbing, cyanosis, or edema Skin: No evidence of breakdown, no evidence of rash, calluses on knees   PRIOR EXAMS: Neurologic:follows simple verbal commands , motor strength is 5/5 in Left and 0/5 right deltoid, bicep, tricep, grip, hip flexor, knee extensors, ankle dorsiflexor and plantar flexor Sensory exam normal sensation to light touch and proprioception in bilateral upper and lower extremities Cerebellar exam unable to do on right due to weakness  Musculoskeletal: Full range of motion in all 4 extremities. No joint swelling   Assessment/Plan: 1. Functional  deficits which require 3+ hours per day of interdisciplinary therapy in a comprehensive inpatient rehab setting. Physiatrist is providing close team supervision and 24 hour management of active medical problems listed below. Physiatrist and rehab team continue to assess barriers to discharge/monitor patient progress toward functional and medical goals  Care Tool:  Bathing    Body parts bathed by patient: Abdomen, Chest   Body parts bathed by helper: Right arm, Left arm, Front perineal area, Buttocks, Right upper leg, Left upper leg, Right lower leg, Left lower leg     Bathing assist Assist Level: Total Assistance - Patient < 25%     Upper Body Dressing/Undressing Upper body dressing   What is the patient wearing?: Pull over shirt    Upper body assist Assist Level: Maximal Assistance - Patient 25 - 49%    Lower Body Dressing/Undressing Lower body dressing      What is the patient wearing?: Pants, Incontinence brief     Lower body assist Assist for lower body dressing: Maximal Assistance - Patient 25 - 49%     Toileting Toileting    Toileting assist Assist for toileting: 2 Helpers     Transfers Chair/bed transfer  Transfers assist  Chair/bed transfer activity did not  occur: N/A  Chair/bed transfer assist level: Maximal Assistance - Patient 25 - 49%     Locomotion Ambulation   Ambulation assist      Assist level: 2 helpers (attempted but unable to tolerate)       Walk 10 feet activity   Assist  Walk 10 feet activity did not occur: Safety/medical concerns        Walk 50 feet activity   Assist Walk 50 feet with 2 turns activity did not occur: Safety/medical concerns         Walk 150 feet activity   Assist Walk 150 feet activity did not occur: Safety/medical concerns         Walk 10 feet on uneven surface  activity   Assist Walk 10 feet on uneven surfaces activity did not occur: Safety/medical concerns          Wheelchair     Assist Is the patient using a wheelchair?: Yes Type of Wheelchair: Manual (TIS w/c)    Wheelchair assist level: Dependent - Patient 0%      Wheelchair 50 feet with 2 turns activity    Assist        Assist Level: Dependent - Patient 0%   Wheelchair 150 feet activity     Assist      Assist Level: Dependent - Patient 0%   Blood pressure (!) 125/93, pulse 87, temperature 97.9 F (36.6 C), temperature source Oral, resp. rate 18, height 5\' 9"  (1.753 m), weight 107.7 kg, SpO2 100 %.  Medical Problem List and Plan: 1. 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  Antithrombotics: -DVT/anticoagulation:  Pharmaceutical: Heparin 5000U q8h             -antiplatelet therapy: Aspirin 81 mg daily and Brilinta 90 mg twice daily 3. Pain Management: Tylenol as needed 4. Mood/Behavior/Sleep: Provide emotional support             -antipsychotic agents: N/A 5. Neuropsych/cognition: This patient is not capable of making decisions on his own behalf. 6. Skin/Wound Care: Routine skin checks 7. Fluids/Electrolytes/Nutrition: Routine in and outs with follow-up chemistries -08/05/22 CBGs are being checked TID AC/HS, unclear why, no hx of DM2, and Hgb A1C 5.6% on 07/25/22; will decrease CBG checks to AM+QHS, if remaining normal, would consider d/c SSI and CBG checks per primary team 8.  Seizure prophylaxis.  EEG negative.  Continue Keppra 500 mg daily 9.  AKI on CKD stage IV.  Follow-up chemistries.  Follow-up outpatient nephrology services             -pt given lokelma prior to rehab admit             -HyperK+ Nephro to manage  -08/05/22 K+ decreasing, now 5.1; Cr 3.64/BUN 86 likely new baseline, nephro following but may sign off tomorrow if stable; bicarb improving, cont sodium bicarb 1300mg   QD; lab recheck ordered tomorrow and then routine labs for Mondays    Latest Ref Rng & Units 08/05/2022    6:07 AM 08/04/2022    6:00 AM 08/03/2022    7:04 AM  BMP  Glucose 70 - 99 mg/dL 99  161  99   BUN 6 - 20 mg/dL  86  82  77   Creatinine 0.61 - 1.24 mg/dL 5.63  8.75  6.43   Sodium 135 - 145 mmol/L 139  137  141   Potassium 3.5 - 5.1 mmol/L 5.1  5.5  5.2   Chloride 98 - 111 mmol/L 106  108  111   CO2 22 - 32 mmol/L 20  18  20    Calcium 8.9 - 10.3 mg/dL 9.5  9.5  9.6      10.  Hyperlipidemia.  Crestor 20mg  QD 11.  Permissive hypertension.  Patient on amlodipine 10 mg daily, hydralazine 50 mg twice daily, labetalol 200 mg twice daily prior to admission.  Resume as needed  -08/05/22 BP stable on Chlorthalidone 25mg  QD, monitor Vitals:   08/02/22 0455 08/02/22 1323 08/02/22 1500 08/02/22 2110  BP: (!) 136/98 (!) 149/95 (!) 149/102 (!) 154/102   08/03/22 0516 08/03/22 1500 08/03/22 2011 08/04/22 0318  BP: (!) 135/90 (!) 140/95 (!) 149/99 (!) 135/97   08/04/22 1315 08/04/22 1957 08/05/22 0551 08/05/22 1253  BP: (!) 144/93 (!) 134/95 (!) 125/93 (!) 129/98    12.  Medical noncompliance.  Provide counseling 13. Constipation: -08/05/22 no BM in 2 days per pt, documentation states LBM yesterday; will monitor for now, cont Colace 100mg  BID and miralax 17g QD, if no BM tomorrow then may need to adjust  LOS: 4 days A FACE TO FACE EVALUATION WAS PERFORMED  [address] 08/05/2022, 12:53 PM

## 2022-08-05 NOTE — Progress Notes (Signed)
Nephrology Follow-Up Consult note   Assessment/Recommendations: Joseph Bright is a/an 47 y.o. male with a past medical history significant for CKD 4, hypertension, admitted for CVA.       AKI on CKD 4: Creatinine has increased as we have adjusted medications.  Not overall surprising.  Likely this will represent his new baseline -Stable at 3.6 -Continue to monitor creatinine daily -Plan for follow-up in the outpatient setting  CVA: Right-sided deficits.  Working with rehab.  Aspirin, Brilinta, rosuvastatin  Hyperkalemia: Has improved with optimization of medications.  Continue chlorthalidone and sodium bicarb  Hypertension: Has improved with chlorthalidone.  No changes today  Metabolic acidosis: Secondary to CKD.  Continue sodium bicarbonate  We may sign off tomorrow if the patient remains stable  Recommendations conveyed to primary service.    Darnell Level [facility] 08/05/2022 10:27 AM  ___________________________________________________________  CC: Stroke  Interval History/Subjective: Patient feels well today with no complaints.  Creatinine stable at 3.6.  Blood pressure overall improved   Medications:  Current Facility-Administered Medications  Medication Dose Route Frequency Provider Last Rate Last Admin   acetaminophen (TYLENOL) tablet 650 mg  650 mg Oral Q4H PRN Charlton Amor, PA-C   650 mg at 08/04/22 2228   Or   acetaminophen (TYLENOL) suppository 650 mg  650 mg Rectal Q4H PRN Angiulli, Mcarthur Rossetti, PA-C       aspirin chewable tablet 81 mg  81 mg Oral Daily Charlton Amor, PA-C   81 mg at 08/05/22 4540   chlorthalidone (HYGROTON) tablet 25 mg  25 mg Oral Daily Darnell Level, MD   25 mg at 08/05/22 9811   docusate sodium (COLACE) capsule 100 mg  100 mg Oral BID Charlton Amor, PA-C   100 mg at 08/05/22 9147   feeding supplement (NEPRO CARB STEADY) liquid 237 mL  237 mL Oral BID BM Jacquelynn Cree, PA-C   237 mL at 08/05/22 8295    heparin injection 5,000 Units  5,000 Units Subcutaneous Q8H Charlton Amor, PA-C   5,000 Units at 08/05/22 0555   insulin aspart (novoLOG) injection 0-15 Units  0-15 Units Subcutaneous TID WC Angiulli, Mcarthur Rossetti, PA-C       levETIRAcetam (KEPPRA XR) 24 hr tablet 500 mg  500 mg Oral Q2200 AngiulliMcarthur Rossetti, PA-C   500 mg at 08/04/22 2227   polyethylene glycol (MIRALAX / GLYCOLAX) packet 17 g  17 g Oral Daily Charlton Amor, PA-C   17 g at 08/05/22 0926   rosuvastatin (CRESTOR) tablet 20 mg  20 mg Oral Daily Pham, Minh Q, RPH-CPP   20 mg at 08/05/22 6213   sodium bicarbonate tablet 1,300 mg  1,300 mg Oral BID Darnell Level, MD   1,300 mg at 08/05/22 0865   sorbitol 70 % solution 15 mL  15 mL Oral Daily PRN Charlton Amor, PA-C   15 mL at 08/04/22 1855   ticagrelor (BRILINTA) tablet 90 mg  90 mg Oral BID Charlton Amor, PA-C   90 mg at 08/05/22 7846      Review of Systems: 10 systems reviewed and negative except per interval history/subjective  Physical Exam: Vitals:   08/04/22 1957 08/05/22 0551  BP: (!) 134/95 (!) 125/93  Pulse: 66 87  Resp: 17 18  Temp: 97.8 F (36.6 C) 97.9 F (36.6 C)  SpO2: 100% 100%   Total I/O In: 240 [P.O.:240] Out: -   Intake/Output Summary (Last 24 hours) at 08/05/2022 1027 Last  data filed at 08/05/2022 0745 Gross per 24 hour  Intake 480 ml  Output 225 ml  Net 255 ml   Constitutional: well-appearing, no acute distress ENMT: ears and nose without scars or lesions, MMM CV: normal rate, no edema Respiratory: Bilateral chest rise, normal work of breathing Gastrointestinal: soft, non-tender, no palpable masses or hernias Skin: no visible lesions or rashes Psych: alert, judgement/insight appropriate, appropriate mood and affect   Test Results I personally reviewed new and old clinical labs and radiology tests Lab Results  Component Value Date   NA 139 08/05/2022   K 5.1 08/05/2022   CL 106 08/05/2022   CO2 20 (L) 08/05/2022    BUN 86 (H) 08/05/2022   CREATININE 3.64 (H) 08/05/2022   CALCIUM 9.5 08/05/2022   ALBUMIN 3.5 08/02/2022   PHOS 3.9 07/27/2022    CBC Recent Labs  Lab 07/31/22 0342 08/01/22 0620 08/02/22 0504  WBC 9.4 8.2 8.6  NEUTROABS  --   --  5.7  HGB 14.6 14.7 14.5  HCT 43.1 44.5 44.2  MCV 91.9 93.9 94.2  PLT 232 229 238

## 2022-08-06 DIAGNOSIS — K5901 Slow transit constipation: Secondary | ICD-10-CM | POA: Diagnosis not present

## 2022-08-06 DIAGNOSIS — I6329 Cerebral infarction due to unspecified occlusion or stenosis of other precerebral arteries: Secondary | ICD-10-CM | POA: Diagnosis not present

## 2022-08-06 DIAGNOSIS — E875 Hyperkalemia: Secondary | ICD-10-CM | POA: Diagnosis not present

## 2022-08-06 LAB — BASIC METABOLIC PANEL
Anion gap: 12 (ref 5–15)
BUN: 90 mg/dL — ABNORMAL HIGH (ref 6–20)
CO2: 18 mmol/L — ABNORMAL LOW (ref 22–32)
Calcium: 9.3 mg/dL (ref 8.9–10.3)
Chloride: 107 mmol/L (ref 98–111)
Creatinine, Ser: 3.63 mg/dL — ABNORMAL HIGH (ref 0.61–1.24)
GFR, Estimated: 20 mL/min — ABNORMAL LOW (ref >60)
Glucose, Bld: 107 mg/dL — ABNORMAL HIGH (ref 70–99)
Potassium: 5.3 mmol/L — ABNORMAL HIGH (ref 3.5–5.1)
Sodium: 137 mmol/L (ref 135–145)

## 2022-08-06 LAB — GLUCOSE, CAPILLARY
Glucose-Capillary: 91 mg/dL (ref 70–99)
Glucose-Capillary: 95 mg/dL (ref 70–99)
Glucose-Capillary: 115 mg/dL — ABNORMAL HIGH (ref 70–99)
Glucose-Capillary: 108 mg/dL — ABNORMAL HIGH (ref 70–99)

## 2022-08-06 NOTE — Progress Notes (Signed)
PROGRESS NOTE   Subjective/Complaints:  Pt doing well. Slept well, denies pain. LBM today, just prior to eval! Urinating well. Denies any other complaints or concerns today. Eager to go home.   ROS- denies CP, SOB, abd pain, n/v/d/c, or other complaints, but perhaps slightly limited by exp aphasia   Objective:   No results found. No results for input(s): "WBC", "HGB", "HCT", "PLT" in the last 72 hours.  Recent Labs    08/05/22 0607 08/06/22 0607  NA 139 137  K 5.1 5.3*  CL 106 107  CO2 20* 18*  GLUCOSE 99 107*  BUN 86* 90*  CREATININE 3.64* 3.63*  CALCIUM 9.5 9.3    Intake/Output Summary (Last 24 hours) at 08/06/2022 1124 Last data filed at 08/06/2022 0900 Gross per 24 hour  Intake 1200 ml  Output 700 ml  Net 500 ml        Physical Exam: Vital Signs Blood pressure (!) 126/93, pulse 88, temperature 98 F (36.7 C), temperature source Oral, resp. rate 16, height 5\' 9"  (1.753 m), weight 107.7 kg, SpO2 100 %.   General: No acute distress, laying in bed Mood and affect are appropriate Heart: Regular rate and rhythm no rubs murmurs or extra sounds Lungs: Clear to auscultation, breathing unlabored, no rales or wheezes Abdomen: Positive bowel sounds, soft nontender to palpation, nondistended Extremities: No clubbing, cyanosis, or edema Skin: No evidence of breakdown, no evidence of rash, calluses on knees   PRIOR EXAMS: Neurologic:follows simple verbal commands , motor strength is 5/5 in Left and 0/5 right deltoid, bicep, tricep, grip, hip flexor, knee extensors, ankle dorsiflexor and plantar flexor Sensory exam normal sensation to light touch and proprioception in bilateral upper and lower extremities Cerebellar exam unable to do on right due to weakness  Musculoskeletal: Full range of motion in all 4 extremities. No joint swelling   Assessment/Plan: 1. Functional deficits which require 3+ hours per day of  interdisciplinary therapy in a comprehensive inpatient rehab setting. Physiatrist is providing close team supervision and 24 hour management of active medical problems listed below. Physiatrist and rehab team continue to assess barriers to discharge/monitor patient progress toward functional and medical goals  Care Tool:  Bathing    Body parts bathed by patient: Abdomen, Chest   Body parts bathed by helper: Right arm, Left arm, Front perineal area, Buttocks, Right upper leg, Left upper leg, Right lower leg, Left lower leg     Bathing assist Assist Level: Total Assistance - Patient < 25%     Upper Body Dressing/Undressing Upper body dressing   What is the patient wearing?: Pull over shirt    Upper body assist Assist Level: Maximal Assistance - Patient 25 - 49%    Lower Body Dressing/Undressing Lower body dressing      What is the patient wearing?: Pants, Incontinence brief     Lower body assist Assist for lower body dressing: Maximal Assistance - Patient 25 - 49%     Toileting Toileting    Toileting assist Assist for toileting: 2 Helpers     Transfers Chair/bed transfer  Transfers assist  Chair/bed transfer activity did not occur: N/A  Chair/bed transfer assist level: Maximal Assistance -  Patient 25 - 49%     Locomotion Ambulation   Ambulation assist      Assist level: 2 helpers (attempted but unable to tolerate)       Walk 10 feet activity   Assist  Walk 10 feet activity did not occur: Safety/medical concerns        Walk 50 feet activity   Assist Walk 50 feet with 2 turns activity did not occur: Safety/medical concerns         Walk 150 feet activity   Assist Walk 150 feet activity did not occur: Safety/medical concerns         Walk 10 feet on uneven surface  activity   Assist Walk 10 feet on uneven surfaces activity did not occur: Safety/medical concerns         Wheelchair     Assist Is the patient using a  wheelchair?: Yes Type of Wheelchair: Manual (TIS w/c)    Wheelchair assist level: Dependent - Patient 0%      Wheelchair 50 feet with 2 turns activity    Assist        Assist Level: Dependent - Patient 0%   Wheelchair 150 feet activity     Assist      Assist Level: Dependent - Patient 0%   Blood pressure (!) 126/93, pulse 88, temperature 98 F (36.7 C), temperature source Oral, resp. rate 16, height 5\' 9"  (1.753 m), weight 107.7 kg, SpO2 100 %.  Medical Problem List and Plan: 1. 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  Antithrombotics: -DVT/anticoagulation:  Pharmaceutical: Heparin 5000U q8h             -antiplatelet therapy: Aspirin 81 mg daily and Brilinta 90 mg twice daily 3. Pain Management: Tylenol as needed 4. Mood/Behavior/Sleep: Provide emotional support             -antipsychotic agents: N/A 5. Neuropsych/cognition: This patient is not capable of making decisions on his own behalf. 6. Skin/Wound Care: Routine skin checks 7. Fluids/Electrolytes/Nutrition: Routine in and outs with follow-up chemistries -08/05/22 CBGs are being checked TID AC/HS, unclear why, no hx of DM2, and Hgb A1C 5.6% on 07/25/22; will decrease CBG checks to AM+QHS, if remaining normal, would consider d/c SSI and CBG checks per primary team 8.  Seizure prophylaxis.  EEG negative.  Continue Keppra 500 mg daily 9.  AKI on CKD stage IV.  Follow-up chemistries.  Follow-up outpatient nephrology services             -pt given lokelma prior to rehab admit             -HyperK+ Nephro to manage  -08/05/22 K+ decreasing, now 5.1; Cr 3.64/BUN 86 likely new baseline, nephro following but may sign off tomorrow if stable; bicarb improving, cont sodium bicarb 1300mg  QD; lab recheck ordered tomorrow and then routine labs  for Mondays -08/06/22 K+ up to 5.3 today, nephro saw pt, state continue chlorthalidone and sodium bicarb; monitor, nephro signed off    Latest Ref Rng & Units 08/06/2022    6:07 AM 08/05/2022    6:07 AM 08/04/2022    6:00 AM  BMP  Glucose 70 - 99 mg/dL 161  99  107   BUN 6 - 20 mg/dL 90  86  82   Creatinine 0.61 - 1.24 mg/dL 7.82  9.56  2.13   Sodium 135 - 145 mmol/L 137  139  137   Potassium 3.5 - 5.1 mmol/L 5.3  5.1  5.5   Chloride 98 - 111 mmol/L 107  106  108   CO2 22 - 32 mmol/L 18  20  18    Calcium 8.9 - 10.3 mg/dL 9.3  9.5  9.5      10.  Hyperlipidemia.  Crestor 20mg  QD 11.  Permissive hypertension.  Patient on amlodipine 10 mg daily, hydralazine 50 mg twice daily, labetalol 200 mg twice daily prior to admission.  Resume as needed  -5/11-12/24 BP stable on Chlorthalidone 25mg  QD, monitor Vitals:   08/02/22 1500 08/02/22 2110 08/03/22 0516 08/03/22 1500  BP: (!) 149/102 (!) 154/102 (!) 135/90 (!) 140/95   08/03/22 2011 08/04/22 0318 08/04/22 1315 08/04/22 1957  BP: (!) 149/99 (!) 135/97 (!) 144/93 (!) 134/95   08/05/22 0551 08/05/22 1253 08/05/22 1945 08/06/22 0534  BP: (!) 125/93 (!) 129/98 125/88 (!) 126/93    12.  Medical noncompliance.  Provide counseling 13. Constipation: -08/05/22 no BM in 2 days per pt, documentation states LBM yesterday; will monitor for now, cont Colace 100mg  BID and miralax 17g QD, if no BM tomorrow then may need to adjust -08/06/22 BM today! Cont regimen and monitor  LOS: 5 days A FACE TO FACE EVALUATION WAS PERFORMED  [address] 08/06/2022, 11:24 AM

## 2022-08-06 NOTE — Progress Notes (Signed)
Nephrology Follow-Up Consult note   Assessment/Recommendations: Joseph Bright is a/an 47 y.o. male with a past medical history significant for CKD 4, hypertension, admitted for CVA.       AKI on CKD 4: Creatinine has increased as we have adjusted medications.  Not overall surprising.  Likely this will represent his new baseline -Stable at 3.6 -Continue to monitor creatinine daily -Recommending patient follow-up in the outpatient setting after discharge  CVA: Right-sided deficits.  Working with rehab.  Aspirin, Brilinta, rosuvastatin  Hyperkalemia: Persistent mild elevation but overall improved with diuretics.  Continue chlorthalidone and sodium bicarb.  Hypertension: Has improved with chlorthalidone.  No changes today  Metabolic acidosis: Secondary to CKD.  Continue sodium bicarbonate  Given the patient's overall stability we will sign off at this time.  Labs weekly are sufficient.  Recommendations conveyed to primary service.    Darnell Level [facility] 08/06/2022 9:25 AM  ___________________________________________________________  CC: Stroke  Interval History/Subjective: Patient tenuous feels well today with no complaints.  Blood pressure good.  Creatinine stable at 3.6.  Continues to have mild hyperkalemia.   Medications:  Current Facility-Administered Medications  Medication Dose Route Frequency Provider Last Rate Last Admin   acetaminophen (TYLENOL) tablet 650 mg  650 mg Oral Q4H PRN Charlton Amor, PA-C   650 mg at 08/04/22 2228   Or   acetaminophen (TYLENOL) suppository 650 mg  650 mg Rectal Q4H PRN Angiulli, Mcarthur Rossetti, PA-C       aspirin chewable tablet 81 mg  81 mg Oral Daily Charlton Amor, PA-C   81 mg at 08/06/22 1610   chlorthalidone (HYGROTON) tablet 25 mg  25 mg Oral Daily Darnell Level, MD   25 mg at 08/06/22 9604   docusate sodium (COLACE) capsule 100 mg  100 mg Oral BID Charlton Amor, PA-C   100 mg at 08/06/22 5409    feeding supplement (NEPRO CARB STEADY) liquid 237 mL  237 mL Oral BID BM Jacquelynn Cree, PA-C   237 mL at 08/05/22 1519   heparin injection 5,000 Units  5,000 Units Subcutaneous Q8H AngiulliMcarthur Rossetti, PA-C   5,000 Units at 08/06/22 0534   insulin aspart (novoLOG) injection 0-15 Units  0-15 Units Subcutaneous TID WC Angiulli, Daniel Shela Commons, PA-C       levETIRAcetam (KEPPRA XR) 24 hr tablet 500 mg  500 mg Oral Q2200 AngiulliMcarthur Rossetti, PA-C   500 mg at 08/05/22 2125   polyethylene glycol (MIRALAX / GLYCOLAX) packet 17 g  17 g Oral Daily Charlton Amor, PA-C   17 g at 08/06/22 8119   rosuvastatin (CRESTOR) tablet 20 mg  20 mg Oral Daily Pham, Minh Q, RPH-CPP   20 mg at 08/06/22 1478   sodium bicarbonate tablet 1,300 mg  1,300 mg Oral BID Darnell Level, MD   1,300 mg at 08/06/22 0838   sorbitol 70 % solution 15 mL  15 mL Oral Daily PRN Charlton Amor, PA-C   15 mL at 08/04/22 1855   ticagrelor (BRILINTA) tablet 90 mg  90 mg Oral BID Charlton Amor, PA-C   90 mg at 08/06/22 2956      Review of Systems: 10 systems reviewed and negative except per interval history/subjective  Physical Exam: Vitals:   08/05/22 2253 08/06/22 0534  BP:  (!) 126/93  Pulse: 81 88  Resp: 19 16  Temp:  98 F (36.7 C)  SpO2: 95% 100%   Total I/O In: -  Out: 300 [Urine:300]  Intake/Output Summary (Last 24 hours) at 08/06/2022 6045 Last data filed at 08/06/2022 0800 Gross per 24 hour  Intake 600 ml  Output 700 ml  Net -100 ml   Constitutional: well-appearing, no acute distress ENMT: ears and nose without scars or lesions, MMM CV: normal rate, no edema Respiratory: Bilateral chest rise, normal work of breathing Gastrointestinal: soft, non-tender, no palpable masses or hernias Skin: no visible lesions or rashes Psych: alert, judgement/insight appropriate, appropriate mood and affect   Test Results I personally reviewed new and old clinical labs and radiology tests Lab Results  Component Value  Date   NA 137 08/06/2022   K 5.3 (H) 08/06/2022   CL 107 08/06/2022   CO2 18 (L) 08/06/2022   BUN 90 (H) 08/06/2022   CREATININE 3.63 (H) 08/06/2022   CALCIUM 9.3 08/06/2022   ALBUMIN 3.5 08/02/2022   PHOS 3.9 07/27/2022    CBC Recent Labs  Lab 07/31/22 0342 08/01/22 0620 08/02/22 0504  WBC 9.4 8.2 8.6  NEUTROABS  --   --  5.7  HGB 14.6 14.7 14.5  HCT 43.1 44.5 44.2  MCV 91.9 93.9 94.2  PLT 232 229 238

## 2022-08-07 ENCOUNTER — Encounter (HOSPITAL_COMMUNITY): Payer: Self-pay

## 2022-08-07 DIAGNOSIS — I6329 Cerebral infarction due to unspecified occlusion or stenosis of other precerebral arteries: Secondary | ICD-10-CM | POA: Diagnosis not present

## 2022-08-07 DIAGNOSIS — F4323 Adjustment disorder with mixed anxiety and depressed mood: Secondary | ICD-10-CM

## 2022-08-07 HISTORY — PX: IR CT HEAD LTD: IMG2386

## 2022-08-07 HISTORY — PX: IR INTRA CRAN STENT: IMG2345

## 2022-08-07 LAB — CBC
HCT: 42.2 % (ref 39.0–52.0)
Hemoglobin: 14.1 g/dL (ref 13.0–17.0)
MCH: 31.5 pg (ref 26.0–34.0)
MCHC: 33.4 g/dL (ref 30.0–36.0)
MCV: 94.2 fL (ref 80.0–100.0)
Platelets: 273 10*3/uL (ref 150–400)
RBC: 4.48 MIL/uL (ref 4.22–5.81)
RDW: 13 % (ref 11.5–15.5)
WBC: 7.8 10*3/uL (ref 4.0–10.5)
nRBC: 0 % (ref 0.0–0.2)

## 2022-08-07 LAB — GLUCOSE, CAPILLARY
Glucose-Capillary: 103 mg/dL — ABNORMAL HIGH (ref 70–99)
Glucose-Capillary: 95 mg/dL (ref 70–99)
Glucose-Capillary: 80 mg/dL (ref 70–99)
Glucose-Capillary: 91 mg/dL (ref 70–99)

## 2022-08-07 LAB — BASIC METABOLIC PANEL
Anion gap: 13 (ref 5–15)
BUN: 90 mg/dL — ABNORMAL HIGH (ref 6–20)
CO2: 17 mmol/L — ABNORMAL LOW (ref 22–32)
Calcium: 9.3 mg/dL (ref 8.9–10.3)
Chloride: 106 mmol/L (ref 98–111)
Creatinine, Ser: 3.78 mg/dL — ABNORMAL HIGH (ref 0.61–1.24)
GFR, Estimated: 19 mL/min — ABNORMAL LOW (ref >60)
Glucose, Bld: 171 mg/dL — ABNORMAL HIGH (ref 70–99)
Potassium: 4.2 mmol/L (ref 3.5–5.1)
Sodium: 136 mmol/L (ref 135–145)

## 2022-08-07 NOTE — Progress Notes (Signed)
Physical Therapy Session Note  Patient Details  Name: Joseph Bright MRN: [id number] Date of Birth: 05/02/1975  Today's Date: 08/07/2022 PT Individual Time: 4098-1191 PT Individual Time Calculation (min): 58 min   Short Term Goals: Week 1:  PT Short Term Goal 1 (Week 1): Pt will be able to perform mod assist transfers to the R PT Short Term Goal 2 (Week 1): Pt will be able to perform w/c propulsion with min assist x 50' PT Short Term Goal 3 (Week 1): Pt will be able to initiate gait training PT Short Term Goal 4 (Week 1): Pt will be able to perform bed mobility with min assist  Skilled Therapeutic Interventions/Progress Updates:  Patient seated upright in w/c on entrance to room. Patient alert and agreeable to PT session. Has new ASO donned to R ankle. When asked who put it on, pt responds, "a girl". ASO adjusted and tightened for increased eversion as well as overall support. Then AFO and shoes donned with MaxA.   Patient with no pain complaint at start of session.  Therapeutic Activity: Bed Mobility: At end of session, pt relates desire to return to bed. Pt performed sit --> supine with MaxA for bringing RLE to bed surface, positioning of RUE and adjustment of body into overall neutral positioning. VC/ tc required for technique in reaching sidelying, then positioning to supine. Pt however self chooses to pivot turn while seated with good trunk control. Transfers: Pt performed sit<>stand transfers throughout session with both LUE support on handrail as well as push from w/c armrest. Is able to rise to stand with steady LUE support and L sided bias with MinA. However, requires max/ TotA for maintaining stance in midline d/t R sided flaccidity. Provided verbal cues for sequence of technique.  At end of session, pt also performs squat pivot transfer w/c to bed toward R side. Provided with instructions for technique prior to performance and able to reach bed in one effort and Mod/ MaxA. Attempts  to scoot backwards with posterior trunk lean and corrected with vc/ tc for forward lean and cued to initiate stance, but as soon as he is unweighted to push back with BLE. Performs posterior scoot with CGA/ MinA.   Gait Training:  Pt ambulated 89' x1/ 25' x1 using L hallway HR with MaxA for RLE advancement, positioning and block of knee to prevent buckle during stance phase. Therapist facilitated lateral weight shift with vc for technique. No quad or glute activation noted throughout.  Neuromuscular Re-ed: NMR facilitated during session with focus on standing balance. Pt guided in rise to stand and focus on midline orientation, R quad activation with muscle tapping for activation, and cues for knee extension with DEP/ PROM assist into extension. Pt also guided in seated hip flexion with guided conscious practice for initiation of movement with DEP movement provided by therapist. NMR performed for improvements in motor control and coordination, balance, sequencing, judgement, and self confidence/ efficacy in performing all aspects of mobility at highest level of independence.   Patient supine in bed at end of session with brakes locked, bed alarm set, and all needs within reach.  Therapy Documentation Precautions:  Precautions Precautions: Fall Precaution Comments: right sided hemiparesis, expressive deficits Restrictions Weight Bearing Restrictions: No General:   Vital Signs:   Pain:  Brief moments of pt wincing with PROM to RLE. Pain relieves with repositioning to neutral LE position.  Therapy/Group: Individual Therapy  Loel Dubonnet PT, DPT, CSRS 08/07/2022, 6:28 PM

## 2022-08-07 NOTE — Progress Notes (Signed)
PROGRESS NOTE   Subjective/Complaints:  No issues overnite , able to say "speech", follows simple commands, minimal verbal output , 1 word answers    ROS- denies CP, SOB, abd pain, n/v/d/c, or other complaints, but limited by exp aphasia   Objective:   No results found. Recent Labs    08/07/22 0730  WBC 7.8  HGB 14.1  HCT 42.2  PLT 273    Recent Labs    08/06/22 0607 08/07/22 0730  NA 137 136  K 5.3* 4.2  CL 107 106  CO2 18* 17*  GLUCOSE 107* 171*  BUN 90* 90*  CREATININE 3.63* 3.78*  CALCIUM 9.3 9.3     Intake/Output Summary (Last 24 hours) at 08/07/2022 0951 Last data filed at 08/07/2022 0723 Gross per 24 hour  Intake 660 ml  Output 775 ml  Net -115 ml         Physical Exam: Vital Signs Blood pressure (!) 122/95, pulse 82, temperature 98.8 F (37.1 C), temperature source Oral, resp. rate 18, height 5\' 9"  (1.753 m), weight 107.7 kg, SpO2 100 %.   General: No acute distress, laying in bed Mood and affect are appropriate Heart: Regular rate and rhythm no rubs murmurs or extra sounds Lungs: Clear to auscultation, breathing unlabored, no rales or wheezes Abdomen: Positive bowel sounds, soft nontender to palpation, nondistended Extremities: No clubbing, cyanosis, or edema Skin: No evidence of breakdown, no evidence of rash, calluses on knees   PRIOR EXAMS: Neurologic:follows simple verbal commands , motor strength is 5/5 in Left and 0/5 right deltoid, bicep, tricep, grip, hip flexor, knee extensors, ankle dorsiflexor and plantar flexor Sensory exam normal sensation to light touch and proprioception in bilateral upper and lower extremities Cerebellar exam unable to do on right due to weakness  Musculoskeletal: Full range of motion in all 4 extremities. No joint swelling   Assessment/Plan: 1. Functional deficits which require 3+ hours per day of interdisciplinary therapy in a comprehensive inpatient  rehab setting. Physiatrist is providing close team supervision and 24 hour management of active medical problems listed below. Physiatrist and rehab team continue to assess barriers to discharge/monitor patient progress toward functional and medical goals  Care Tool:  Bathing    Body parts bathed by patient: Abdomen, Chest   Body parts bathed by helper: Right arm, Left arm, Front perineal area, Buttocks, Right upper leg, Left upper leg, Right lower leg, Left lower leg     Bathing assist Assist Level: Total Assistance - Patient < 25%     Upper Body Dressing/Undressing Upper body dressing   What is the patient wearing?: Pull over shirt    Upper body assist Assist Level: Maximal Assistance - Patient 25 - 49%    Lower Body Dressing/Undressing Lower body dressing      What is the patient wearing?: Pants, Incontinence brief     Lower body assist Assist for lower body dressing: Maximal Assistance - Patient 25 - 49%     Toileting Toileting    Toileting assist Assist for toileting: 2 Helpers     Transfers Chair/bed transfer  Transfers assist  Chair/bed transfer activity did not occur: N/A  Chair/bed transfer assist level: Maximal  Assistance - Patient 25 - 49%     Locomotion Ambulation   Ambulation assist      Assist level: 2 helpers (attempted but unable to tolerate)       Walk 10 feet activity   Assist  Walk 10 feet activity did not occur: Safety/medical concerns        Walk 50 feet activity   Assist Walk 50 feet with 2 turns activity did not occur: Safety/medical concerns         Walk 150 feet activity   Assist Walk 150 feet activity did not occur: Safety/medical concerns         Walk 10 feet on uneven surface  activity   Assist Walk 10 feet on uneven surfaces activity did not occur: Safety/medical concerns         Wheelchair     Assist Is the patient using a wheelchair?: Yes Type of Wheelchair: Manual (TIS w/c)     Wheelchair assist level: Dependent - Patient 0%      Wheelchair 50 feet with 2 turns activity    Assist        Assist Level: Dependent - Patient 0%   Wheelchair 150 feet activity     Assist      Assist Level: Dependent - Patient 0%   Blood pressure (!) 122/95, pulse 82, temperature 98.8 F (37.1 C), temperature source Oral, resp. rate 18, height 5\' 9"  (1.753 m), weight 107.7 kg, SpO2 100 %.  Medical Problem List and Plan: 1. 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  Antithrombotics: -DVT/anticoagulation:  Pharmaceutical: Heparin 5000U q8h             -antiplatelet therapy: Aspirin 81 mg daily and Brilinta 90 mg twice daily 3. Pain Management: Tylenol as needed 4. Mood/Behavior/Sleep: Provide emotional support             -antipsychotic agents: N/A 5. Neuropsych/cognition: This patient is not capable of making decisions on his own behalf. 6. Skin/Wound Care: Routine skin checks 7. Fluids/Electrolytes/Nutrition: Routine in and outs with follow-up chemistries -08/05/22 CBGs are being checked TID AC/HS, unclear why, no hx of DM2, and Hgb A1C 5.6% on 07/25/22; will decrease CBG checks to AM+QHS, if remaining normal, would consider d/c SSI and CBG checks per primary team 8.  Seizure prophylaxis.  EEG negative.  Continue Keppra 500 mg daily 9.  AKI on CKD stage IV.  Follow-up chemistries.  Follow-up outpatient nephrology services             -pt given lokelma prior to rehab admit             -HyperK+ Nephro to manage  improved    Latest Ref Rng & Units 08/07/2022    7:30 AM 08/06/2022    6:07 AM 08/05/2022    6:07 AM  BMP  Glucose 70 - 99 mg/dL 960  454  99   BUN 6 - 20 mg/dL 90  90  86   Creatinine 0.61 - 1.24 mg/dL 0.98  1.19  1.47   Sodium 135 - 145 mmol/L 136  137  139    Potassium 3.5 - 5.1 mmol/L 4.2  5.3  5.1   Chloride  98 - 111 mmol/L 106  107  106   CO2 22 - 32 mmol/L 17  18  20    Calcium 8.9 - 10.3 mg/dL 9.3  9.3  9.5      10.  Hyperlipidemia.  Crestor 20mg  QD 11.  Permissive hypertension.  Patient on amlodipine 10 mg daily, hydralazine 50 mg twice daily, labetalol 200 mg twice daily prior to admission.  Resume as needed  5/13 controlled Chlorthalidone 25mg  QD, monitor Vitals:   08/03/22 1500 08/03/22 2011 08/04/22 0318 08/04/22 1315  BP: (!) 140/95 (!) 149/99 (!) 135/97 (!) 144/93   08/04/22 1957 08/05/22 0551 08/05/22 1253 08/05/22 1945  BP: (!) 134/95 (!) 125/93 (!) 129/98 125/88   08/06/22 0534 08/06/22 1248 08/06/22 2015 08/07/22 0520  BP: (!) 126/93 135/87 (!) 140/97 (!) 122/95    12.  Medical noncompliance.  Provide counseling 13. Constipation: -08/05/22 no BM in 2 days per pt, documentation states LBM yesterday; will monitor for now, cont Colace 100mg  BID and miralax 17g QD, if no BM tomorrow then may need to adjust Loose BM x 2 yesterday   LOS: 6 days A FACE TO FACE EVALUATION WAS PERFORMED  Erick Colace 08/07/2022, 9:51 AM

## 2022-08-07 NOTE — Consult Note (Signed)
Neuropsychological Consultation Comprehensive Inpatient Rehab   Patient:   Joseph Bright   DOB:   [birth year]  MR Number:  [id number]  Location:  [facility] [address] CENTER A [id number] [place] [address] Dept: [phone] Loc: [phone]           Date of Service:   08/07/2022  Start Time:   8 AM End Time:   9 AM  Provider/Observer:  Arley Phenix, Psy.D.       Clinical Neuropsychologist       Billing Code/Service: [phone]  Reason for Service:    [name] is a 47 year old male referred for neuropsychological consultation due to coping and adjustment issues and ongoing cognitive changes secondary to recent cerebrovascular accident and current admission onto the comprehensive inpatient rehabilitation unit.  Patient has a past medical history including hypertension, stage IV chronic kidney disease and recent medical noncompliance.  Patient presented on 07/25/2022 after developing acute onset of right-sided weakness and expressive language difficulty.  Cranial CT/CTA/MRI have identified a chronic appearing right thalamic lacunar infarct, acute occlusion of left ACA A2 segment with follow-up MRI showing confluent left ACA territory infarction.  Crypto toxic edema with no hemorrhagic transformation was noted.  There was also additional scattered ischemic foci in the left greater than right MCA, PCA and occasionally identified in cerebellar artery territories.  Patient with continuing difficulties with functional mobility, right-sided weakness and expressive language deficits and recent admission onto the comprehensive rehabilitation program.  During the visit today the patient continued to show difficulties with word finding and other changes in expressive language capacity.  The patient appeared to maintain adequate receptive language capacity.  Right motor deficits greater than left motor deficits noted.   Patient was emotional at times with loss of functioning but notes improvement since initial deficits after acute stroke phase.  Patient acknowledges his lack of compliance with his overall health care and notes that this is having a negative impact on him emotionally identifying what he had not been doing to maintain good health.  Patient was oriented x 4 but limits to full objective assessment of these capacities were limited by his expressive language deficits.  Patient is able to communicate some but clear word finding and grammatical limitations are noted.  HPI for the current admission:    HPI: [name] is a 47 year old right-handed male with history of hypertension, CKD stage IV followed at outpatient nephrology, as well as medical noncompliance, quit smoking 22 years ago. Per chart review patient lives with spouse-year-old daughter. 1 level home multiple steps to entry. Independent prior to admission. Presented 07/25/2022 with acute onset of right-sided weakness as well as speech difficulty. Cranial CT scan negative for acute changes. There was chronic appearing right thalamic lacunar infarct. 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 Status post TNK with interventional radiology for stenting. MRI follow-up showed confluent left ACA territory infarction. Cytotoxic edema with no hemorrhagic transformation. Multiple additional scattered mostly punctate ischemic foci in the left greater than right MCA, PCA and occasionally cerebellar artery territories. No significant mass effect. Echocardiogram with ejection fraction of 70 to 75% grade 1 diastolic dysfunction. Patient with reported seizure-like activity. EEG negative for seizure. Patient was maintained on Keppra for seizure prophylaxis. TEE unremarkable no PFO, no cardioembolic source. Neurology follow-up maintained on aspirin as well as Brilinta 90 mg twice daily for CVA prophylaxis. Underwent loop recorder  placement 07/28/2022. Subcutaneous heparin  added for DVT prophylaxis. Initially on tube feeds for nutritional support her diet has been advanced to regular. Bouts of hyperkalemia patient did require Lokelma with latest potassium 5.8 and follow-up chemistries pending. Therapy evaluations completed due to patient's decreased functional mobility and right-sided weakness was admitted for a comprehensive rehab program.   Medical History:   Past Medical History:  Diagnosis Date   Hypertension    Stroke [facility])          Patient Active Problem List   Diagnosis Date Noted   Adjustment disorder with mixed anxiety and depressed mood 08/07/2022   Cerebrovascular accident (CVA) due to occlusion of left posterior communicating artery (HCC) 08/01/2022   Acute ischemic left ACA stroke (HCC) 07/25/2022   Arterial ischemic stroke, ACA (anterior cerebral artery), left, acute (HCC) 07/25/2022   DYSLIPIDEMIA 04/24/2007   RENAL FAILURE NOS 11/29/2006   HYPERTENSION, ESSENTIAL NOS 11/12/2006    Behavioral Observation/Mental Status:   [name]  presents as a 47 y.o.-year-old Right handed African American Male who appeared his stated age. his dress was Appropriate and he was Well Groomed and his manners were Appropriate to the situation.  his participation was indicative of Appropriate and Redirectable behaviors.  There were physical disabilities noted.  he displayed an appropriate level of cooperation and motivation.    Interactions:    Active Appropriate  Attention:   abnormal and attention span appeared shorter than expected for age  Memory:   abnormal; remote memory intact, recent memory impaired  Visuo-spatial:   not examined  Speech (Volume):  low  Speech:   non-fluent aphasia; slurred  Thought Process:  Coherent and Relevant  Organized and Rational  Though Content:  WNL; not suicidal and not homicidal  Orientation:   person, place, time/date, and  situation  Judgment:   Fair  Planning:   Poor  Affect:    Anxious and Tearful  Mood:    Dysphoric  Insight:   Fair  Intelligence:   normal  Psychiatric History:  No past psychiatric history noted.  Family Med/Psych History: History reviewed. No pertinent family history.  Risk of Suicide/Violence: low patient denies any suicidal or homicidal ideation but is struggling with the development of significant expressive language deficits and motor deficits.  Impression/DX:   Lawyer Buster is a 47 year old male referred for neuropsychological consultation due to coping and adjustment issues and ongoing cognitive changes secondary to recent cerebrovascular accident and current admission onto the comprehensive inpatient rehabilitation unit.  Patient has a past medical history including hypertension, stage IV chronic kidney disease and recent medical noncompliance.  Patient presented on 07/25/2022 after developing acute onset of right-sided weakness and expressive language difficulty.  Cranial CT/CTA/MRI have identified a chronic appearing right thalamic lacunar infarct, acute occlusion of left ACA A2 segment with follow-up MRI showing confluent left ACA territory infarction.  Crypto toxic edema with no hemorrhagic transformation was noted.  There was also additional scattered ischemic foci in the left greater than right MCA, PCA and occasionally identified in cerebellar artery territories.  Patient with continuing difficulties with functional mobility, right-sided weakness and expressive language deficits and recent admission onto the comprehensive rehabilitation program.  During the visit today the patient continued to show difficulties with word finding and other changes in expressive language capacity.  The patient appeared to maintain adequate receptive language capacity.  Right motor deficits greater than left motor deficits noted.  Patient was emotional at times with loss of functioning but notes  improvement since initial deficits after acute  stroke phase.  Patient acknowledges his lack of compliance with his overall health care and notes that this is having a negative impact on him emotionally identifying what he had not been doing to maintain good health.  This has included noncompliance with CPAP device as he has been diagnosed with obstructive sleep apnea prior.  Patient is willingly using a CPAP device on unit.  Patient was oriented x 4 but limits to full objective assessment of these capacities were limited by his expressive language deficits.  Patient is able to communicate some but clear word finding and grammatical limitations are noted.  Disposition/Plan:  Today we worked on coping and adjustment issues with significant loss of functioning and identifying aspects to continue to work on not only while he is on the inpatient rehab unit but also maintaining medical compliance going forward.  Diagnosis:    Adjustment disorder with depression and anxiety type symptoms.         Electronically Signed   _______________________ Arley Phenix, Psy.D. Clinical Neuropsychologist

## 2022-08-07 NOTE — Progress Notes (Signed)
Speech Language Pathology Daily Session Note  Patient Details  Name: Joseph Bright MRN: [id number] Date of Birth: [birth year]  Today's Date: 08/07/2022 SLP Individual Time: 0900-1000 SLP Individual Time Calculation (min): 60 min  Short Term Goals: Week 1: SLP Short Term Goal 1 (Week 1): Pt will follow 2-3 step commands with >75% accuracy given min cues. SLP Short Term Goal 2 (Week 1): Pt will utilize compensatory word-finding strategies to repair communication breakdowns with >50% accuracy given min cues. SLP Short Term Goal 3 (Week 1): Pt wil complete object description tasks with >75% accuracy given min cues.  Skilled Therapeutic Interventions:  Pt was seen in PM to address expressive and receptive language. Pt was alert and seen at bedside, agreeable for session. SLP addressed object description and use of compensatory word finding strategies. SLP provided instruction and training in use of circumlocution and substitution strategies. Pt was challenged in structured task of describing common objects. Pt completed task requiring mod to max verbal and visual cues and instruction on adding pertinent information and additional details to express thoughts. Pt was further challenged in picture description task with SLP providing mod verbal cues for increased details. Pt most responsive to carrier phrase and phonemic cues during session. Occasional difficulty observed with pt accurate response to yes/ no questions. SLP further facilitated communication through challenging pt's functional communication. Pt verbalized his address, children's name, and employer. Greater difficulty observed attempting to express more complex information including job role. Instruction to pt complete on tasks to be completed outside of sessions to facilitate language. Pt with strengths this session in reading and comprehending single words. Direct handoff to MD with pt left at bedside. SLP to continue POC.   Pain  No/ denies  pain  Therapy/Group: Individual Therapy  Renaee Munda 08/07/2022, 12:36 PM

## 2022-08-07 NOTE — Progress Notes (Signed)
Occupational Therapy Session Note  Patient Details  Name: Joseph Bright MRN: [id number] Date of Birth: [birth year]  Today's Date: 08/07/2022 OT Individual Time: 1046-1200 OT Individual Time Calculation (min): 74 min    Short Term Goals: Week 1:  OT Short Term Goal 1 (Week 1): PT will transfer to the toilet/ BSC with mod A in both directions OT Short Term Goal 2 (Week 1): Pt will don shirt wtih mod A  unsupported. OT Short Term Goal 3 (Week 1): Pt will perform bed mobility to come to EOB in prep for ADLs- with mod A consistency OT Short Term Goal 4 (Week 1): Pt will perform sit to stands with min A consistency for clothing management.  Skilled Therapeutic Interventions/Progress Updates:     Pt received sitting up in bed presenting to be in good spirits receptive to skilled OT session reporting 0/10 pain- OT offering intermittent rest breaks, repositioning, and therapeutic support to optimize participation in therapy session. Pt's friend present at beginning of session. Pt continuing to present with expressive aphasia- OT utilizing simple language, yes/no questions, and gestures throughout session to support optimal communication.   Pt noted to be soiled in urine upon OT arrival and receptive to getting cleaned up prior to session. Pt able to roll to L side with supervision and transition to EOB with light mod A to bring RLE to EOB and fully lift trunk. Pt completed multiple sit,>stands using RW while completing bathing and dressing tasks with min A LUE supported on bed rail and was able to maintain static standing balance min A with OT blocking LLE. Pt able to complete doff shirt and complete UB bathing with CGA without UE support. LB bathing completed mod A to was lower BLEs and bottom. Reviewed hemi-dressing techniques with Pt with Pt able to recall with min questioning cues. Pt donned shirt with min A for set-up and to weave RUE into sleeve and mod VB cues for technique. Donned pants mod A to  weave RLE and bring pants to waist in standing. Socks, R ASO, and shoes donned total A for time management.   Pt previously using TISWC, however d/t Pt's improved trunk control, OT retrieved standard wc with RUE half-tray to trial during session with Pt able to tolerate without slipping out of chair. Informed nursing staff of change and to inform therapy team if they notice any discomfort, awkward positioning, or safety concerns during day when pt is upright in wc. Pt completed squat pivot to L side to wc with mod A and min VB cues for technique.   Transported Pt total A to therapy gym in wc for time management and energy conservation.   1:1 NMES applied to wrist extensors and digit flexors to facilitate activation of muscles required for functional grasp/release.   Ratio 1:5 Rate 35 pps Waveform- Asymmetric Ramp 1.0 Pulse 300 Intensity- 15 Duration - 10 minutes  No adverse reactions after treatment and is skin intact. Pt with noted muscle contraction of both wrist extensors and digit flexors, however no full contraction noted at this time. Utilized visual imagery and functional task of picking up a wash cloth in combination with NMES with mild improvement noted in grasp moving through beginning ROM initiating digit flexion.   Pt transported back to his room total A in his wc. Pt was left resting in wc with call bell in reach, seat belt alarm on, and all needs met.   Therapy Documentation Precautions:  Precautions Precautions: Fall Precaution Comments: right sided hemiparesis,  expressive deficits Restrictions Weight Bearing Restrictions: No General:   Vital Signs: Therapy Vitals Temp: 98.8 F (37.1 C) Temp Source: Oral Pulse Rate: 82 Resp: 18 BP: (Abnormal) 122/95 Patient Position (if appropriate): Lying Oxygen Therapy SpO2: 100 % O2 Device: Room Air  Therapy/Group: Individual Therapy  Army Fossa 08/07/2022, 7:58 AM

## 2022-08-07 NOTE — Progress Notes (Signed)
RT placed patient on CPAP HS. No O2 bleed in needed. Patient tolerating well at this time. 

## 2022-08-08 DIAGNOSIS — I6329 Cerebral infarction due to unspecified occlusion or stenosis of other precerebral arteries: Secondary | ICD-10-CM | POA: Diagnosis not present

## 2022-08-08 LAB — GLUCOSE, CAPILLARY
Glucose-Capillary: 102 mg/dL — ABNORMAL HIGH (ref 70–99)
Glucose-Capillary: 85 mg/dL (ref 70–99)
Glucose-Capillary: 96 mg/dL (ref 70–99)

## 2022-08-08 NOTE — Progress Notes (Signed)
Physical Therapy Session Note  Patient Details  Name: Joseph Bright MRN: [id number] Date of Birth: December 25, 1975  Today's Date: 08/08/2022 PT Individual Time: 8469-6295 PT Individual Time Calculation (min): 42 min   Short Term Goals: Week 1:  PT Short Term Goal 1 (Week 1): Pt will be able to perform mod assist transfers to the R PT Short Term Goal 2 (Week 1): Pt will be able to perform w/c propulsion with min assist x 50' PT Short Term Goal 3 (Week 1): Pt will be able to initiate gait training PT Short Term Goal 4 (Week 1): Pt will be able to perform bed mobility with min assist   Skilled Therapeutic Interventions/Progress Updates:  Patient seated upright in w/c on entrance to room. Patient alert and agreeable to PT session. AFO, ASO, and shoes already donned.   Patient with no pain complaint at start of session.  Therapeutic Activity: Transfers: Pt performed sit<>stand transfers throughout session with ModA +1. Uses RW placed to pt's L side for LUE support once pushing up from seated surface. Compensates with L sided lean. Provided vc for attempts to perform in midline. Slight improvement noted following NMR.  Squat pivot to L side throughout session w/c<>mat table. Requires Mod A with minimal setup. MinA following instructions for weight bearing to L throughout while using RLE as "kickstand" for support.   Neuromuscular Re-ed: NMR facilitated during session with focus on seated and standing balance. Pt guided in seated reach far anterior to BOS for removal and replacement of squigz on mirror from overhead to just below hip height in seated position. Scoots L side to EOM and is able to lean far forward to complete all reaches and replacements to mirror. Guarded RLE to prevent any forward weight shifting. None noted. Cued to exhale on reach rather than to hold breath.   Guided in sit<>stands with focus on steps required for proper performance. Used mirror to facilitate midline positioning.  Guard to R knee and support to UB to maintain midline. Pt guided in minisquats 3x10reps with seated rest break required between for pt fatigue. Required MaxA for balance and maintaining proper R knee flexion/ extension throughout. No RLE activation noted in glute or quad this session.   NMR performed for improvements in motor control and coordination, balance, sequencing, judgement, and self confidence/ efficacy in performing all aspects of mobility at highest level of independence.   Patient seated upright for lunch at end of session with brakes locked, belt alarm set, and all needs within reach.   Therapy Documentation Precautions:  Precautions Precautions: Fall Precaution Comments: right sided hemiparesis, expressive deficits Restrictions Weight Bearing Restrictions: No General:   Vital Signs:   Pain: Pain Assessment Pain Scale: 0-10 Pain Score: 0-No pain  Therapy/Group: Individual Therapy  Loel Dubonnet PT, DPT, CSRS 08/08/2022, 10:32 AM

## 2022-08-08 NOTE — Progress Notes (Signed)
Physical Therapy Session Note  Patient Details  Name: Joseph Bright MRN: [id number] Date of Birth: 07-10-1975  Today's Date: 08/08/2022 PT Individual Time: 7829-5621 PT Individual Time Calculation (min): 54 min   Short Term Goals: Week 1:  PT Short Term Goal 1 (Week 1): Pt will be able to perform mod assist transfers to the R PT Short Term Goal 2 (Week 1): Pt will be able to perform w/c propulsion with min assist x 50' PT Short Term Goal 3 (Week 1): Pt will be able to initiate gait training PT Short Term Goal 4 (Week 1): Pt will be able to perform bed mobility with min assist  Skilled Therapeutic Interventions/Progress Updates:    Pt received sitting in w/c and agreeable to therapy session. Pt already wearing R LE ASO and Matrix Max GRAFO.  Transported to/from gym in w/c for time management and energy conservation.  Sit<>stands w/c<>L HR or 3 Musketeer support with mod assist of 1 as pt compensates heavily by keeping weight shifted onto L LE, thus requiring decreased assistance  Gait training 23ft + 113ft + 19ft + 56ft starting with L UE support on L hallway rail for initial ~70ft then transitioned to 3 Musketeer support remainder of gait training with +2 mod assist for balance and R LE management. Pt demonstrating the following gait deviations with therapist providing the described cuing and facilitation for improvement:  - +2 assist providing facilitation for weight shifting onto stance limbs ~50-75% of the time - without this, pt has short, step-to gait pattern with decreased R LE weight shift onto R stance limb; however, with this is able to achieve reciprocal stepping pattern ~50% of the time during final gait trial - therapist providing heavy mod assist for balance as pt with occasional strong R lateral lean during L stance phase  - requires total assist to advance R LE during swing with no flexion muscle activation noticeable - requires total assist to facilitate R LE hip/knee  extension during R stance phase with pt having palpable activation of R quads, but not strong enough to prevent buckle - therapist providing multimodal cuing throughout for increased R quad/glute activation during stance and for weight shifting  Vitals after 144ft gait: BP 150/120 (MAP 131), HR 100bpm with pt denying any symptoms using regular, blue BP cuff with tight fit Reassessed in sitting with larger, burgundy BP cuff: BP 139/107 (MAP 117), HR 97bpm, SpO2 99% Vitals after 32ft gait: BP 136/103 (MAP 113), HR 99bpm (using larger cuff) Vitals after 16ft: BP 114/98 (MAP 104), HR 103bpm   At end of session, pt left seated upright in w/c with needs in reach, seat belt alarm on, and R UE support on 1/2 lap tray.  Therapy Documentation Precautions:  Precautions Precautions: Fall Precaution Comments: right sided hemiparesis, expressive deficits Restrictions Weight Bearing Restrictions: No   Pain: Denies pain during session.    Therapy/Group: Individual Therapy  Ginny Forth , PT, DPT, NCS, CSRS 08/08/2022, 12:36 PM

## 2022-08-08 NOTE — Progress Notes (Signed)
Speech Language Pathology Daily Session Note  Patient Details  Name: Yacqub Cicotte MRN: [id number] Date of Birth: 07-20-1975  Today's Date: 08/08/2022 SLP Individual Time: 0230-0330 SLP Individual Time Calculation (min): 60 min  Short Term Goals: Week 1: SLP Short Term Goal 1 (Week 1): Pt will follow 2-3 step commands with >75% accuracy given min cues. SLP Short Term Goal 2 (Week 1): Pt will utilize compensatory word-finding strategies to repair communication breakdowns with >50% accuracy given min cues. SLP Short Term Goal 3 (Week 1): Pt wil complete object description tasks with >75% accuracy given min cues.  Skilled Therapeutic Interventions:  Pt was seen in PM to address address expressive language deficit. Pt was alert and seated upright in WC upon SLP arrival. He was agreeable for session. SLP addressing divergent and responsive naming task and functional communication. Pt answered open ended wh questions regarding biographical and personal hx with sup A and ability to express information in most attempts.  X1 instance of pt unsuccessful in verbalizing his brother's name. SLP further addressed divergent naming through challenging pt to identify items in a given category. Pt completed task requriing min to mod A with most success observed given a carrier phrase or syllabic cue. SLP also addressing responsive naming with pt completing task with 80% acc. In additional minutes of session, SLP addressed sentence completion task which pt completed with 90% acc with min A. Pt left seated upright in WC with chair alarm active and call button within reach. SLP to continue POC.   Pain Pain Assessment Pain Scale: 0-10 Pain Score: 1  Pain Location: Foot Pain Orientation: Right Pain Descriptors / Indicators: Aching  Therapy/Group: Individual Therapy  Renaee Munda 08/08/2022, 3:27 PM

## 2022-08-08 NOTE — Progress Notes (Signed)
Occupational Therapy Session Note  Patient Details  Name: Joseph Bright MRN: [id number] Date of Birth: November 27, 1975  Today's Date: 08/08/2022 OT Individual Time: 4098-1191 OT Individual Time Calculation (min): 40 min  OT Individual Time: 1140-11:55 OT Individual Time Calculation (min): 15 min   Short Term Goals: Week 1:  OT Short Term Goal 1 (Week 1): PT will transfer to the toilet/ BSC with mod A in both directions OT Short Term Goal 1 - Progress (Week 1): Met OT Short Term Goal 2 (Week 1): Pt will don shirt wtih mod A  unsupported. OT Short Term Goal 2 - Progress (Week 1): Met OT Short Term Goal 3 (Week 1): Pt will perform bed mobility to come to EOB in prep for ADLs- with mod A consistency OT Short Term Goal 3 - Progress (Week 1): Met OT Short Term Goal 4 (Week 1): Pt will perform sit to stands with min A consistency for clothing management. OT Short Term Goal 4 - Progress (Week 1): Met  Skilled Therapeutic Interventions/Progress Updates:     AM Session:  Pt received reclined in bed with MD present in room upon OT arrival. Pt presenting to be in good spirits receptive to skilled OT session reporting 0/10 pain- OT offering intermittent rest breaks, repositioning, and therapeutic support to optimize participation in therapy session. Pt noted to be soiled in urine and stool upon OT arrival. Engaged pt in bed level rolling tasks to facilitate weight bearing through RU/LE for increased proprioceptive feedback while OT assisted with cleaning up. Pt able to roll to R side using bed features with supervision. OT positioning RLE to facilitate active engagement when pushing to roll to L side and facilitating RUE reaching across body, however minimal active engagement noted at this time requiring max A during roll. OT assisted with posterior/anterior peri-care and donned brief total A. Pt able to utilize modified technique to bring RLE off EOB requiring light min a to fully bring RLE off EOB and lift  trunk to come to sitting. Once seated EOB, Pt able to maintain sitting balance with supervision. Reviewed hemi dressing techniques with Pt with mod VB cues and demonstration provided to recall correct technique. Pt then able to use modified technique to don shirt with mod VB cues and CGA for sitting balance. Utilized modified technique of crossing L foot under his R to weave his R foot into his pants and then weave LLE with CGA and mod VB cues for technique. Donned socks, ASO, AFO, and tennis shoes total A for time management. Stood with OT providing mod A and R knee stabilization with LUE positioned on bed rail for balance and OT brought Pt's pants to waist total A. Squat Pivot to wc light mod A to L side. Placed Sabeo on Pt RUE for 60 minutes of unattended e-stem to increase activation of wrist extensors with Pt able to tolerate 5 clicks with noted wrist activation. Informed PT who would be seeing Pt's shortly after to remove and check skin during her session. Pt left resting in wc with call bell in reach, seat belt alarm on, and all needs met.   PM Session:  Pt received sitting up in in wc presenting to be in good spirits and receptive to skilled OT session with focus on RUE NMR reporting 0/10 pain. Engaged Pt in simple grasp/release activity using wash cloth with OT providing mod tactile cues for muscle activation and maximal VB cues for motivation and activation. When in gravity eliminated plane, Pt able  to initiate grasp moving his 1st, 4th, and 5th digit through 75% ROM. Increased challenge noted when flexing thumb and 3rd finger. Palpable muscle contraction of digit extensors when Pt giving maximal effort. Worked on maintaining gras on folder wash cloth while moving through functional movement pattern of bringing hand to mouth with Pt able to complete x3 trials with maximal effort. Pt was left resting in wc with call bell in reach, seat belt alarm on, and all needs met.    Therapy  Documentation Precautions:  Precautions Precautions: Fall Precaution Comments: right sided hemiparesis, expressive deficits Restrictions Weight Bearing Restrictions: No General:   Vital Signs: Therapy Vitals Temp: 98.3 F (36.8 C) Temp Source: Oral Pulse Rate: 83 Resp: 18 BP: 118/84 Patient Position (if appropriate): Lying Oxygen Therapy SpO2: 99 % O2 Device: Room Air  Therapy/Group: Individual Therapy  Army Fossa 08/08/2022, 8:05 AM

## 2022-08-08 NOTE — Progress Notes (Signed)
PROGRESS NOTE   Subjective/Complaints:  No issues overnite , states he needs to be "cleaned up", "#2"   ROS- denies CP, SOB, abd pain, n/v/d/c, or other complaints, but limited by exp aphasia   Objective:   No results found. Recent Labs    08/07/22 0730  WBC 7.8  HGB 14.1  HCT 42.2  PLT 273     Recent Labs    08/06/22 0607 08/07/22 0730  NA 137 136  K 5.3* 4.2  CL 107 106  CO2 18* 17*  GLUCOSE 107* 171*  BUN 90* 90*  CREATININE 3.63* 3.78*  CALCIUM 9.3 9.3     Intake/Output Summary (Last 24 hours) at 08/08/2022 0908 Last data filed at 08/07/2022 1834 Gross per 24 hour  Intake 417 ml  Output --  Net 417 ml         Physical Exam: Vital Signs Blood pressure 118/84, pulse 83, temperature 98.3 F (36.8 C), temperature source Oral, resp. rate 18, height 5\' 9"  (1.753 m), weight 107.7 kg, SpO2 99 %.   General: No acute distress, laying in bed Mood and affect are appropriate Heart: Regular rate and rhythm no rubs murmurs or extra sounds Lungs: Clear to auscultation, breathing unlabored, no rales or wheezes Abdomen: Positive bowel sounds, soft nontender to palpation, nondistended Extremities: No clubbing, cyanosis, or edema Skin: No evidence of breakdown, no evidence of rash, calluses on knees   PRIOR EXAMS: Neurologic:follows simple verbal commands , motor strength is 5/5 in Left and 0/5 right deltoid, bicep, tricep, grip, hip flexor, knee extensors, ankle dorsiflexor and plantar flexor Sensory exam normal sensation to light touch and proprioception in bilateral upper and lower extremities Cerebellar exam unable to do on right due to weakness  Musculoskeletal: Full range of motion in all 4 extremities. No joint swelling   Assessment/Plan: 1. Functional deficits which require 3+ hours per day of interdisciplinary therapy in a comprehensive inpatient rehab setting. Physiatrist is providing close team  supervision and 24 hour management of active medical problems listed below. Physiatrist and rehab team continue to assess barriers to discharge/monitor patient progress toward functional and medical goals  Care Tool:  Bathing    Body parts bathed by patient: Abdomen, Chest   Body parts bathed by helper: Right arm, Left arm, Front perineal area, Buttocks, Right upper leg, Left upper leg, Right lower leg, Left lower leg     Bathing assist Assist Level: Total Assistance - Patient < 25%     Upper Body Dressing/Undressing Upper body dressing   What is the patient wearing?: Pull over shirt    Upper body assist Assist Level: Maximal Assistance - Patient 25 - 49%    Lower Body Dressing/Undressing Lower body dressing      What is the patient wearing?: Pants, Incontinence brief     Lower body assist Assist for lower body dressing: Maximal Assistance - Patient 25 - 49%     Toileting Toileting    Toileting assist Assist for toileting: 2 Helpers     Transfers Chair/bed transfer  Transfers assist  Chair/bed transfer activity did not occur: N/A  Chair/bed transfer assist level: Maximal Assistance - Patient 25 - 49%  Locomotion Ambulation   Ambulation assist      Assist level: 2 helpers (attempted but unable to tolerate)       Walk 10 feet activity   Assist  Walk 10 feet activity did not occur: Safety/medical concerns        Walk 50 feet activity   Assist Walk 50 feet with 2 turns activity did not occur: Safety/medical concerns         Walk 150 feet activity   Assist Walk 150 feet activity did not occur: Safety/medical concerns         Walk 10 feet on uneven surface  activity   Assist Walk 10 feet on uneven surfaces activity did not occur: Safety/medical concerns         Wheelchair     Assist Is the patient using a wheelchair?: Yes Type of Wheelchair: Manual (TIS w/c)    Wheelchair assist level: Dependent - Patient 0%       Wheelchair 50 feet with 2 turns activity    Assist        Assist Level: Dependent - Patient 0%   Wheelchair 150 feet activity     Assist      Assist Level: Dependent - Patient 0%   Blood pressure 118/84, pulse 83, temperature 98.3 F (36.8 C), temperature source Oral, resp. rate 18, height 5\' 9"  (1.753 m), weight 107.7 kg, SpO2 99 %.  Medical Problem List and Plan: 1. 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  Antithrombotics: -DVT/anticoagulation:  Pharmaceutical: Heparin 5000U q8h             -antiplatelet therapy: Aspirin 81 mg daily and Brilinta 90 mg twice daily 3. Pain Management: Tylenol as needed 4. Mood/Behavior/Sleep: Provide emotional support             -antipsychotic agents: N/A 5. Neuropsych/cognition: This patient is not capable of making decisions on his own behalf. 6. Skin/Wound Care: Routine skin checks 7. Fluids/Electrolytes/Nutrition: Routine in and outs with follow-up chemistries -08/05/22 CBGs are being checked TID AC/HS, unclear why, no hx of DM2, and Hgb A1C 5.6% on 07/25/22; will decrease CBG checks to AM+QHS, if remaining normal, would consider d/c SSI and CBG checks per primary team 8.  Seizure prophylaxis.  EEG negative.  Continue Keppra 500 mg daily 9.  AKI on CKD stage IV.  Follow-up chemistries.  Follow-up outpatient nephrology services             -pt given lokelma prior to rehab admit             -HyperK+ Nephro to manage  improved    Latest Ref Rng & Units 08/07/2022    7:30 AM 08/06/2022    6:07 AM 08/05/2022    6:07 AM  BMP  Glucose 70 - 99 mg/dL 161  096  99   BUN 6 - 20 mg/dL 90  90  86   Creatinine 0.61 - 1.24 mg/dL 0.45  4.09  8.11   Sodium 135 - 145 mmol/L 136  137  139   Potassium 3.5 - 5.1 mmol/L 4.2  5.3  5.1    Chloride 98 - 111 mmol/L 106  107  106   CO2 22 - 32 mmol/L 17  18  20    Calcium 8.9 - 10.3 mg/dL 9.3  9.3  9.5      10.  Hyperlipidemia.  Crestor 20mg  QD 11.  Permissive hypertension.  Patient on amlodipine 10 mg daily, hydralazine 50 mg twice daily, labetalol 200 mg twice daily prior to admission.  Resume as needed  5/13 controlled Chlorthalidone 25mg  QD, monitor Vitals:   08/04/22 1315 08/04/22 1957 08/05/22 0551 08/05/22 1253  BP: (!) 144/93 (!) 134/95 (!) 125/93 (!) 129/98   08/05/22 1945 08/06/22 0534 08/06/22 1248 08/06/22 2015  BP: 125/88 (!) 126/93 135/87 (!) 140/97   08/07/22 0520 08/07/22 1246 08/07/22 2037 08/08/22 0453  BP: (!) 122/95 137/89 129/85 118/84    12.  Medical noncompliance.  Provide counseling 13. Constipation: -08/05/22 no BM in 2 days per pt, documentation states LBM yesterday; will monitor for now, cont Colace 100mg  BID and miralax 17g QD, if no BM tomorrow then may need to adjust Loose BM x 2 yesterday  Incontinent with urgency, pt states he has some sensation of need , may need to develop means of communicating need for BM  LOS: 7 days A FACE TO FACE EVALUATION WAS PERFORMED  Erick Colace 08/08/2022, 9:08 AM

## 2022-08-08 NOTE — Progress Notes (Addendum)
Patient ID: Joseph Bright, male   DOB: 1975/06/30, 47 y.o.   MRN: [id number] Met with the patient to review current situation, rehab process, team conference and plan of care. Reviewed secondary risk management including HTN. HLD (LDL 74/Trig 193/HDL 28) on crestor, OSA +CPAP now on Brilinta and Keppra, and Na Bicarb tabs. Reviewed non compliance PTA with medications; reported "not working"; but has noted an improvement since hospitalized and has CPAP at home as well. Discussed medications, and dietary modification recommendations including list of foods to increase HLD level and heart healthy, limited fats, broiled or baked items vs fried and increased protein for metabolism.  Reviewed anticipated follow up with physicians, and BP monitoring and care of loop recording device. Continue to follow along to address educational needs to facilitate preparation for discharge. Pamelia Hoit

## 2022-08-09 DIAGNOSIS — I6329 Cerebral infarction due to unspecified occlusion or stenosis of other precerebral arteries: Secondary | ICD-10-CM | POA: Diagnosis not present

## 2022-08-09 LAB — GLUCOSE, CAPILLARY
Glucose-Capillary: 89 mg/dL (ref 70–99)
Glucose-Capillary: 78 mg/dL (ref 70–99)
Glucose-Capillary: 83 mg/dL (ref 70–99)
Glucose-Capillary: 94 mg/dL (ref 70–99)

## 2022-08-09 NOTE — Patient Care Conference (Signed)
Inpatient RehabilitationTeam Conference and Plan of Care Update Date: 08/09/2022   Time: 10:43 AM    Patient Name: Joseph Bright      Medical Record Number: [id number]  Date of Birth: March 29, 1975 Sex: Male         Room/Bed: 4W07C/4W07C-01 Payor Info: Payor: CIG[employer] / Plan: CIGNA MANAGED / Product Type: *No Product type* /    Admit Date/Time:  08/01/2022  4:34 PM  Primary Diagnosis:  Cerebrovascular accident (CVA) due to occlusion of left posterior communicating artery [facility])  Hospital Problems: Principal Problem:   Cerebrovascular accident (CVA) due to occlusion of left posterior communicating artery (HCC) Active Problems:   Adjustment disorder with mixed anxiety and depressed mood    Expected Discharge Date: Expected Discharge Date: 08/31/22  Team Members Present: Physician leading conference: Dr. Claudette Laws Social Worker Present: Lavera Guise, BSW Nurse Present: Chana Bode, RN PT Present: Casimiro Needle, PT OT Present: Bonnell Public, OT SLP Present: Other (comment) Fae Pippin, SLP) PPS Coordinator present : Fae Pippin, SLP     Current Status/Progress Goal Weekly Team Focus  Bowel/Bladder   Pt incontinent B/B  LBM 08/08/22   Pt will regain B/B continence   Assist with toileting needs q3hr qsshift    Swallow/Nutrition/ Hydration               ADL's   UB dressing/bathing min A, LB dressing min A, LB bathing mod A, min A grooming/hygeine, max A toileting, mod A squat pivot transfers <> toielt using grab bars, mod A squat pivto <> TTB- improved activation of distal RUE with gross grasp, beging to have palpable tricep extension   supervision to min A   BADL retraining, funtional transfers, RUE NMR, dynamic sitting balance, standing balance, activity tolerance, Pt education    Mobility   mod assist bed mobility only for R hemibody management & lightly for trunk, min/mod A squat pivot transfers (limited/no active movement in R LE), mod assist sit<>stands  using L UE support, gait training up to 174ft in litegait mod/max BWS overground (some activation in R quad during stance) otherwise 3 Musketeer support with +2 mod assist for balance and total assist R LE management (developing R LE flexor tone, but continue monitoring)   supervision bed mobility, min assist transfers, mod assist gait, mod-I w/c mobility  pt education, transfer training, standing balance, R hemibody NMR, gait training, activity tolerance/cardiopulmonary endurance    Communication   non fluent expressive language deficits, Overall Mod-Max A for functional communication   min a   describing naming and implementing compensatory strategies    Safety/Cognition/ Behavioral Observations               Pain   denies pain at this time   Pt will be free from pain   Assess for pain qshift/prn    Skin   Skin is intact   Will maintain skin intergrity with no breakdown  Assess skin qshift/prn for breakdown      Discharge Planning:  Discharging home with spouse who [facility].  Spouse anticpates providing 24/7. Barrier: Insurance for follow up and DME. Patient also has 15 steps to enter his home.   Team Discussion: Patient with bowel incontinence and communication issues,  and slight ham string tone post left PCA CVA. Limited by fatigue.  Patient on target to meet rehab goals: yes, currently needs min assist for upper body care and mod assist for lower body care and max assist for toileting.  Able to complete sit -  stand with mod assist with left UE support and squat pivot transfers with min - mod assist; has quad activation, right hand activation and tricep extension. Working on expression of basic wants and needs.  Goals for discharge set for supervision - min assist overall.  *See Care Plan and progress notes for long and short-term goals.   Revisions to Treatment Plan:  E-stim Light gait AFO trial  Teaching Needs: Safety, medications, dietary modifications, transfers,  toileting, etc.   Current Barriers to Discharge: Decreased caregiver support and Home enviroment access/layout  Possible Resolutions to Barriers: Family education OP follow up services DME: TBD     Medical Summary Current Status: bowel urgency,Some RUE return, BPs improving  Barriers to Discharge: Medical stability;Morbid Obesity   Possible Resolutions to [employer] Focus: Neuromuscular re education, orthotic management , establish communication of basic needs   Continued Need for Acute Rehabilitation Level of Care: The patient requires daily medical management by a physician with specialized training in physical medicine and rehabilitation for the following reasons: Direction of a multidisciplinary physical rehabilitation program to maximize functional independence : Yes Medical management of patient stability for increased activity during participation in an intensive rehabilitation regime.: Yes Analysis of laboratory values and/or radiology reports with any subsequent need for medication adjustment and/or medical intervention. : Yes   I attest that I was present, lead the team conference, and concur with the assessment and plan of the team.   Chana Bode B 08/09/2022, 4:44 PM

## 2022-08-09 NOTE — Progress Notes (Signed)
Patient ID: Joseph Bright, male   DOB: September 22, 1975, 47 y.o.   MRN: [id number]  Team Conference Report to Patient/Family  Team Conference discussion was reviewed with the patient and caregiver, including goals, any changes in plan of care and target discharge date.  Patient and caregiver express understanding and are in agreement.  The patient has a target discharge date of 08/31/22.  SW spoke with pt and spouse, Joseph Bright and provided team conference updates and address questions or concerns. Spouse inquiring about the usage of pt's R leg and hand, SW will have therapist follow up with spouse. Pt has made great progress and has great awareness per team.   Spouse would like SW to follow up with her periodically of pt's status of stair usage. Spouse reports she has been in contact with their leasing office. If pt is able to navigate the steps the will not move apartments. If pt unable to navigate steps she will then initiate the move. Spouse reports they have been living in their current apartment for about 8+ years.   SW informed spouse that [employer] confirmed on yesterday that they will assist pt with DME needs as their employee. Therapist informed Layla Barter will assist with DME and aware to use [employer].  Andria Rhein 08/09/2022, 2:14 PM

## 2022-08-09 NOTE — Progress Notes (Addendum)
Occupational Therapy Weekly Progress Note  Patient Details  Name: Joseph Bright MRN: [id number] Date of Birth: Jul 05, 1975  Beginning of progress report period: Aug 02, 2022 End of progress report period: Aug 09, 2022  Today's Date: 08/09/2022 OT Individual Time: 0900-0950 OT Individual Time Calculation (min): 50 min  OT Individual Time: 1346-1415 OT Individual Time Calculation (min): 29 min   Patient has met 4 of 4 short term goals.  Pt is demonstrating increased independence in completing BADLs and is progressing towards reaching his goals. Pt is able to complete UB dressing/bathing at min A level using modified technique. Pt completes LB dressing min A while seated EOB and LB bathing mod A. Pt is able to complete squat pivot transfers to toilet in both directions mod A. Pt receives support from his family and friends who visit often and inquire about his progress.  Patient continues to demonstrate the following deficits: muscle weakness, decreased cardiorespiratoy endurance, impaired timing and sequencing, abnormal tone, unbalanced muscle activation, decreased coordination, and decreased motor planning, decreased attention to right and decreased motor planning, decreased problem solving and decreased safety awareness, central origin, and decreased sitting balance, decreased standing balance, decreased postural control, hemiplegia, and decreased balance strategies and therefore will continue to benefit from skilled OT intervention to enhance overall performance with BADL, iADL, Vocation, and Reduce care partner burden.  Patient progressing toward long term goals..  Continue plan of care.  OT Short Term Goals Week 1:  OT Short Term Goal 1 (Week 1): PT will transfer to the toilet/ BSC with mod A in both directions OT Short Term Goal 1 - Progress (Week 1): Met OT Short Term Goal 2 (Week 1): Pt will don shirt wtih mod A  unsupported. OT Short Term Goal 2 - Progress (Week 1): Met OT Short Term  Goal 3 (Week 1): Pt will perform bed mobility to come to EOB in prep for ADLs- with mod A consistency OT Short Term Goal 3 - Progress (Week 1): Met OT Short Term Goal 4 (Week 1): Pt will perform sit to stands with min A consistency for clothing management. OT Short Term Goal 4 - Progress (Week 1): Met Week 2:  OT Short Term Goal 1 (Week 2): Pt will recall hemidressing technique with min questioning cus consistently OT Short Term Goal 2 (Week 2): Pt will complete squat pivot transfers in both directions with min A OT Short Term Goal 3 (Week 2): Pt will maintain standing balance with min A consistently for BADLs LRAD PRN OT Short Term Goal 4 (Week 2): Pt will don pants mod A consistently  Skilled Therapeutic Interventions/Progress Updates:     AM Session:  Pt received sitting up in bed presenting to be in good spirits receptive to skilled OT session reporting 0/10 pain- OT offering intermittent rest breaks, repositioning, and therapeutic support to optimize participation in therapy session. Pt brief dry upon OT arrival, Pt politely denying need to use restroom.   Examined Pt's skin and doffed his ASO upon arrival with no skin breakdown noted. Plan to wear only AFO with tennis shoes this session to prevent increased pressure on bony prominences.  Flattened bed to simulate home environment for bed mobility. Pt able to utilized modified technique to bring RLE to EOB with mod VB cueing and CGA. Transitioned to sitting EOB with min A to lift trunk using bed features. MD in/out for morning rounding.   Pt able to recall hemi-dressing technique this session with min questioning cues. Pt donned pants  by crossing LLE under RLE to lift foot off ground and reaching towards ground to weave feet CGA. Socks, AFO and tennis shoes donned total A for time management. Pt completed squat pivot transfer EOB>wc with light mod A to L side. Transported Pt total A to therapy gym for time management and energy conservation.    Squat pivot WC>EOM light mod A to L side. Engaged Pt in seated AAROM with OT providing light manual resistance while moving through functional reaching pattern. Pt with improved grasp this session and able to maintain grasp on OT's hand while moving through reaching patterns with palpable activation triceps and pectoral muscles noted. Applied Sabeo to triceps with improved muscle contraction with pt able to tolerate 4 clicks. Following session, Sabeo remained donned on R triceps for un-attended E-stim. Pt handed off to PT at end of session- spoke with PT to inform that Sabeo will run for 60 minutes and to provide skin check when removing with PT in agreement.   PM Session:  Pt received sitting up in wc presenting to be in good spirits receptive to skilled OT session reporting 0/10 pain- OT offering intermittent rest breaks, repositioning, and therapeutic support to optimize participation in therapy session. Focus this session increased functional use of LUE.   Pt transported total A to therapy gym in wc for time management and energy conservation. Squat pivot wc>EOM min A with OT facilitating LLE stabilization.   Engaged Pt in dynamic sitting balance bean bag activity with weight bearing facilitation through RUE to provide increased proprioceptive input and improved muscle activation. Pt instructed to reach across his body with LUE to retrieve bean bags from his R side while lowering onto his R elbow and pushing through his RUE to return trunk to midline. Pt able to transport bean bags 10x2 trials with min A to lift trunk and push through RUE. Improved tricep activation noted during task.   Engaged pt in wash cloth drop activity using his RUE to facilitate maintained grasp for increased use during BADLs and IADL. Pt able able to grasp wash cloth with R hand x6 trials and drop onto floor with gravity assisted finger extension facilitated during activity. OT provided max A under elbow and wrist and  tactile cueing for muscle engagement.   Squat pivot to R side min A with min VB cues for technique. Pt transported back to room total A in wc. Pt was left resting in wc with call bell in reach, seat belt alarm on, and all needs met.      Therapy Documentation Precautions:  Precautions Precautions: Fall Precaution Comments: right sided hemiparesis, expressive deficits Restrictions Weight Bearing Restrictions: No General:   Vital Signs: Therapy Vitals Temp: 98 F (36.7 C) Temp Source: Oral Pulse Rate: 80 Resp: 18 BP: 115/87 Patient Position (if appropriate): Lying Oxygen Therapy SpO2: 100 % O2 Device: Room Air   ADL: ADL Eating: Set up Where Assessed-Eating: Wheelchair Grooming: Minimal assistance Where Assessed-Grooming: Sitting at sink Upper Body Bathing: Minimal assistance Where Assessed-Upper Body Bathing: Edge of bed Lower Body Bathing: Moderate assistance Where Assessed-Lower Body Bathing: Sitting at sink Upper Body Dressing: Moderate assistance Where Assessed-Upper Body Dressing: Sitting at sink Lower Body Dressing: Maximal assistance, Moderate assistance Where Assessed-Lower Body Dressing: Sitting at sink Toileting: Maximal assistance Where Assessed-Toileting: Teacher, adult education: Moderate assistance Toilet Transfer Method: Squat pivot    Therapy/Group: Individual Therapy  Army Fossa 08/09/2022, 8:04 AM

## 2022-08-09 NOTE — Progress Notes (Signed)
Speech Language Pathology Daily Session Note  Patient Details  Name: Joseph Bright MRN: [id number] Date of Birth: [birth year]  Today's Date: 08/09/2022 SLP Individual Time: 1100-1200 SLP Individual Time Calculation (min): 60 min  Short Term Goals: Week 1: SLP Short Term Goal 1 (Week 1): Pt will follow 2-3 step commands with >75% accuracy given min cues. SLP Short Term Goal 2 (Week 1): Pt will utilize compensatory word-finding strategies to repair communication breakdowns with >50% accuracy given min cues. SLP Short Term Goal 3 (Week 1): Pt wil complete object description tasks with >75% accuracy given min cues.  Skilled Therapeutic Interventions:  Pt was seen in late am to address expressive language. Pt was alert and seated upright in WC. He denied pain and was agreeable for ST session. Pt was challenged in functional communication. He verbalized his name, his children's name, address, DOB, job, and siblings successfully in response to wh questions. Pt presents with communication breakdown when responding to more abstract wh questions. Pt challenged in completion of idioms which he completed with 100% acc. He completed responsive naming task with 69% acc indep improving to 81% acc with min to mod cues (carrier phrase or phonemic/ syllabic cue). He completed divergent naming task warranting min to mod A. In additional minutes of session, SLP challenged pt in use of LARK kit for confrontational naming and description. Pt completed confrontational naming with 100% acc (22/22). He described objects with 65% acc indep improving to 94% acc with min to mod cues. In final minutes of session, pt was challenged to verbalize informal description of items within his room warranting min A. Pt was left seated upright in WC with call button within reach and chair alarm active. SLP to continue POC.   Pain Pain Assessment Pain Scale: 0-10 Pain Score: 0-No pain  Therapy/Group: Individual Therapy  Renaee Munda 08/09/2022, 12:34 PM

## 2022-08-09 NOTE — Progress Notes (Signed)
PROGRESS NOTE   Subjective/Complaints:  Working with OT , good sitting balance some return in the Right hand   ROS-  limited by exp aphasia   Objective:   No results found. Recent Labs    08/07/22 0730  WBC 7.8  HGB 14.1  HCT 42.2  PLT 273     Recent Labs    08/07/22 0730  NA 136  K 4.2  CL 106  CO2 17*  GLUCOSE 171*  BUN 90*  CREATININE 3.78*  CALCIUM 9.3     Intake/Output Summary (Last 24 hours) at 08/09/2022 0918 Last data filed at 08/09/2022 0725 Gross per 24 hour  Intake 1054 ml  Output --  Net 1054 ml         Physical Exam: Vital Signs Blood pressure 115/87, pulse 80, temperature 98 F (36.7 C), temperature source Oral, resp. rate 18, height 5\' 9"  (1.753 m), weight 107.7 kg, SpO2 100 %.   General: No acute distress, laying in bed Mood and affect are appropriate Heart: Regular rate and rhythm no rubs murmurs or extra sounds Lungs: Clear to auscultation, breathing unlabored, no rales or wheezes Abdomen: Positive bowel sounds, soft nontender to palpation, nondistended Extremities: No clubbing, cyanosis, or edema Skin: No evidence of breakdown, no evidence of rash, calluses on knees   PRIOR EXAMS: Neurologic:follows simple verbal commands , motor strength is 5/5 in Left and 0/5 right deltoid, bicep,trace  tricep,2- grip, hip flexor, knee extensors, ankle dorsiflexor and plantar flexor Sensory exam normal sensation to light touch and proprioception in bilateral upper and lower extremities Cerebellar exam unable to do on right due to weakness  Musculoskeletal: Full range of motion in all 4 extremities. No joint swelling   Assessment/Plan: 1. Functional deficits which require 3+ hours per day of interdisciplinary therapy in a comprehensive inpatient rehab setting. Physiatrist is providing close team supervision and 24 hour management of active medical problems listed below. Physiatrist and  rehab team continue to assess barriers to discharge/monitor patient progress toward functional and medical goals  Care Tool:  Bathing    Body parts bathed by patient: Abdomen, Chest   Body parts bathed by helper: Right arm, Left arm, Front perineal area, Buttocks, Right upper leg, Left upper leg, Right lower leg, Left lower leg     Bathing assist Assist Level: Total Assistance - Patient < 25%     Upper Body Dressing/Undressing Upper body dressing   What is the patient wearing?: Pull over shirt    Upper body assist Assist Level: Maximal Assistance - Patient 25 - 49%    Lower Body Dressing/Undressing Lower body dressing      What is the patient wearing?: Pants, Incontinence brief     Lower body assist Assist for lower body dressing: Maximal Assistance - Patient 25 - 49%     Toileting Toileting    Toileting assist Assist for toileting: 2 Helpers     Transfers Chair/bed transfer  Transfers assist  Chair/bed transfer activity did not occur: N/A  Chair/bed transfer assist level: Moderate Assistance - Patient 50 - 74% (squat pivot)     Locomotion Ambulation   Ambulation assist      Assist level:  2 helpers Assistive device: Other (comment) (3 Musketeer) Max distance: 14ft   Walk 10 feet activity   Assist  Walk 10 feet activity did not occur: Safety/medical concerns  Assist level: 2 helpers Assistive device: Other (comment) (3 Musketeer)   Walk 50 feet activity   Assist Walk 50 feet with 2 turns activity did not occur: Safety/medical concerns         Walk 150 feet activity   Assist Walk 150 feet activity did not occur: Safety/medical concerns         Walk 10 feet on uneven surface  activity   Assist Walk 10 feet on uneven surfaces activity did not occur: Safety/medical concerns         Wheelchair     Assist Is the patient using a wheelchair?: Yes Type of Wheelchair: Manual (TIS w/c)    Wheelchair assist level: Dependent -  Patient 0%      Wheelchair 50 feet with 2 turns activity    Assist        Assist Level: Dependent - Patient 0%   Wheelchair 150 feet activity     Assist      Assist Level: Dependent - Patient 0%   Blood pressure 115/87, pulse 80, temperature 98 F (36.7 C), temperature source Oral, resp. rate 18, height 5\' 9"  (1.753 m), weight 107.7 kg, SpO2 100 %.  Medical Problem List and Plan: 1. 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  Antithrombotics: -DVT/anticoagulation:  Pharmaceutical: Heparin 5000U q8h             -antiplatelet therapy: Aspirin 81 mg daily and Brilinta 90 mg twice daily 3. Pain Management: Tylenol as needed 4. Mood/Behavior/Sleep: Provide emotional support             -antipsychotic agents: N/A 5. Neuropsych/cognition: This patient is not capable of making decisions on his own behalf. 6. Skin/Wound Care: Routine skin checks 7. Fluids/Electrolytes/Nutrition: Routine in and outs with follow-up chemistries -08/05/22 CBGs are being checked TID AC/HS, unclear why, no hx of DM2, and Hgb A1C 5.6% on 07/25/22; will decrease CBG checks to AM+QHS, if remaining normal, would consider d/c SSI and CBG checks per primary team 8.  Seizure prophylaxis.  EEG negative.  Continue Keppra 500 mg daily 9.  AKI on CKD stage IV.  Follow-up chemistries.  Follow-up outpatient nephrology services             -pt given lokelma prior to rehab admit             -HyperK+ Nephro to manage  improved    Latest Ref Rng & Units 08/07/2022    7:30 AM 08/06/2022    6:07 AM 08/05/2022    6:07 AM  BMP  Glucose 70 - 99 mg/dL 308  657  99   BUN 6 - 20 mg/dL 90  90  86   Creatinine 0.61 - 1.24 mg/dL 8.46  9.62  9.52   Sodium 135 - 145 mmol/L 136  137  139   Potassium 3.5 - 5.1 mmol/L 4.2  5.3   5.1   Chloride 98 - 111 mmol/L 106  107  106   CO2 22 -  32 mmol/L 17  18  20    Calcium 8.9 - 10.3 mg/dL 9.3  9.3  9.5      10.  Hyperlipidemia.  Crestor 20mg  QD 11.  Permissive hypertension.  Patient on amlodipine 10 mg daily, hydralazine 50 mg twice daily, labetalol 200 mg twice daily prior to admission.  Resume as needed  5/13 controlled Chlorthalidone 25mg  QD, monitor Vitals:   08/05/22 1253 08/05/22 1945 08/06/22 0534 08/06/22 1248  BP: (!) 129/98 125/88 (!) 126/93 135/87   08/06/22 2015 08/07/22 0520 08/07/22 1246 08/07/22 2037  BP: (!) 140/97 (!) 122/95 137/89 129/85   08/08/22 0453 08/08/22 1524 08/08/22 1944 08/09/22 0543  BP: 118/84 (!) 139/94 (!) 137/99 115/87    12.  Medical noncompliance.  Provide counseling 13. Constipation: -08/05/22 no BM in 2 days per pt, documentation states LBM yesterday; will monitor for now, cont Colace 100mg  BID and miralax 17g QD, if no BM tomorrow then may need to adjust Loose BM x 2 yesterday  Incontinent with urgency, pt states he has some sensation of need , may need to develop means of communicating need for BM  LOS: 8 days A FACE TO FACE EVALUATION WAS PERFORMED  Erick Colace 08/09/2022, 9:18 AM

## 2022-08-09 NOTE — Progress Notes (Signed)
Physical Therapy Weekly Progress Note  Patient Details  Name: Joseph Bright MRN: [id number] Date of Birth: 1976-02-11  Beginning of progress report period: Aug 02, 2022 End of progress report period: Aug 09, 2022  Today's Date: 08/09/2022 PT Individual Time: 4098-1191 and 1432-1500 PT Individual Time Calculation (min): 41 min and 28 min  Patient has met 3 of 4 short term goals. [name] is progressing well with therapy demonstrating increasing return of R hemibody strength; however, this remains his greatest deficit. He is performing supine<>sit with min assist, sit<>stands with mod assist, squat pivot transfers with min/mod assist, and gait training using 3 Musketeer support with +2 mod assist for balance and R LE management. Pt is demonstrating activation of R LE quads during stance with improving stance control requiring mod/max assist for stability using GRAFO; however, is unable to advance R LE during swing requiring total assist. Pt demonstrates excellent motivation to continue advancing his functional mobility level and will benefit from continued CIR level skilled physical therapy prior to D/Cing home with 24hr family support.  Patient continues to demonstrate the following deficits muscle weakness, muscle joint tightness, and muscle paralysis, decreased cardiorespiratoy endurance, impaired timing and sequencing, abnormal tone, unbalanced muscle activation, decreased coordination, and decreased motor planning,  , and decreased sitting balance, decreased standing balance, decreased postural control, hemiplegia, and decreased balance strategies and therefore will continue to benefit from skilled PT intervention to increase functional independence with mobility.  Patient progressing toward long term goals..  Continue plan of care.  PT Short Term Goals Week 1:  PT Short Term Goal 1 (Week 1): Pt will be able to perform mod assist transfers to the R PT Short Term Goal 1 - Progress (Week 1):  Met PT Short Term Goal 2 (Week 1): Pt will be able to perform w/c propulsion with min assist x 50' PT Short Term Goal 2 - Progress (Week 1): Progressing toward goal PT Short Term Goal 3 (Week 1): Pt will be able to initiate gait training PT Short Term Goal 3 - Progress (Week 1): Met PT Short Term Goal 4 (Week 1): Pt will be able to perform bed mobility with min assist PT Short Term Goal 4 - Progress (Week 1): Met Week 2:  PT Short Term Goal 1 (Week 2): Pt will perform supine<>sit with CGA PT Short Term Goal 2 (Week 2): Pt will perform sit<>stands using LRAD with min assist PT Short Term Goal 3 (Week 2): Pt will perform bed<>chair transfers using LRAD with min assist PT Short Term Goal 4 (Week 2): Pt will ambulate at least 14ft using LRAD with mod assist of 1 and +2 assist as needed PT Short Term Goal 5 (Week 2): Pt will navigate 4 stairs using B HRs with mod assist of 1 and +2 for safety as needed  Skilled Therapeutic Interventions/Progress Updates:    Session 1: Pt received sitting on EOM with OT and pt agreeable to continue with this therapy session so PT assumed care of pt. Pt already wearing R LE Matrix Max GRAFO - per OT, pt's skin intact prior to donning AFO and pt denies any discomfort while wearing brace. R squat pivot EOM>w/c with min assist for pivoting hips and therapist blocking R knee to promote WBing for NMR.  Vitals sitting in w/c: BP 113/96 (MAP 104), HR 88bpm   Sit>stands w/c>L UE support (either on hallway rail or via 2nd person) with mod assist from therapist for balance - therapist facilitating R LE positioning and  promoting WBing with manual facilitation for R hip/knee extension.  Gait training 61ft + 34ft + 28ft  using 3 Musketeer support with +2 mod assist for balance and R LE management. Pt demonstrating the following gait deviations with therapist providing the described cuing and facilitation for improvement:  - +2 assist providing facilitation for weight shifting onto  stance limbs ~50% of the time - pt improving with more consistent reciprocal stepping pattern with longer R stance phase times - therapist providing heavy mod assist for balance as pt with occasional strong R lateral lean during L stance phase, occurs more frequently towards end of gait cycles - requires total assist to advance R LE during swing with no flexion muscle activation noticeable, possible hamstring tone developing but will continue to monitor - requires mod/max assist to facilitate R LE hip/knee extension during R stance phase with pt having activation of R quads!! And strong enough to prevent buckle ~50% of the time - therapist providing multimodal cuing throughout for increased R quad/glute activation during stance and for weight shifting  Doffed Saebo, skin intact and notified OT.  At end of session, pt left seated in w/c in the care of NT.  Session 2: Pt received sitting in w/c and agreeable to participate in therapy session. Pt's R UE had fallen off of half lap tray, but he was unable to reach around and get it back up on the tray without assistance.  Transported to/from gym in w/c for time management and energy conservation.  Therapist swapped out pt's wheelchair for a lower floor-to-seat height to allow pt to work on hemi-propulsion when needed and also a longer seat depth to improve pt's positioning.  Pt already wearing R LE Matrix Max GRAFO. L squat pivot w/c>EOM with light min progressing towards CGA in this direction as pt able to use strong L side to compensate.  Sit>stands to L HHA from +2 with mod assist of 1 for balance and facilitating increased R hip/knee extension while rising to stand.   Gait training ~55ft x2 + ~76ft using 3 Musketeer support with +2 mod assist for balance and R LE management. Pt demonstrating the following gait deviations with therapist providing the described cuing and facilitation for improvement:  - pt continues to demo improving ability to  weight shift R/L onto stance limb, continues to benefit from min facilitation for R weight shift to prolong R stance phase to allow reciprocal step on L LE  - continues to demo improving R LE glute and quad activation during stance phase requiring more consistent mod assist to block R knee buckle  - requires total assist to advance R LE during swing; however, during last gait trial donned shoe cover on R toes to allow pt easier advancement of R LE during swing and pt demos ability to initiate swing phase!! However, does try to do this via quad activation so cuing to perform hip/knee flexion with some improvement. - pt progressed to being able to turn and sit in a chair at end of each gait cycle!!  Transported back to room and pt left in care of NT.    Therapy Documentation Precautions:  Precautions Precautions: Fall Precaution Comments: right sided hemiparesis, expressive deficits Restrictions Weight Bearing Restrictions: No   Pain:  Session 1: Denies pain during session.  Session 2: Denies pain during session.   Therapy/Group: Individual Therapy  Ginny Forth , PT, DPT, NCS, CSRS 08/09/2022, 7:59 AM

## 2022-08-10 DIAGNOSIS — I6329 Cerebral infarction due to unspecified occlusion or stenosis of other precerebral arteries: Secondary | ICD-10-CM | POA: Diagnosis not present

## 2022-08-10 LAB — GLUCOSE, CAPILLARY
Glucose-Capillary: 97 mg/dL (ref 70–99)
Glucose-Capillary: 96 mg/dL (ref 70–99)
Glucose-Capillary: 98 mg/dL (ref 70–99)
Glucose-Capillary: 90 mg/dL (ref 70–99)

## 2022-08-10 NOTE — Progress Notes (Signed)
PROGRESS NOTE   Subjective/Complaints:  Working with OT , good sitting balance some return in the Right hand   ROS-  limited by exp aphasia   Objective:   No results found. No results for input(s): "WBC", "HGB", "HCT", "PLT" in the last 72 hours.   No results for input(s): "NA", "K", "CL", "CO2", "GLUCOSE", "BUN", "CREATININE", "CALCIUM" in the last 72 hours.   Intake/Output Summary (Last 24 hours) at 08/10/2022 0846 Last data filed at 08/10/2022 0759 Gross per 24 hour  Intake 714 ml  Output 1000 ml  Net -286 ml         Physical Exam: Vital Signs Blood pressure 126/87, pulse 92, temperature 97.9 F (36.6 C), temperature source Oral, resp. rate 18, height 5\' 9"  (1.753 m), weight 107.7 kg, SpO2 100 %.   General: No acute distress, laying in bed Mood and affect are appropriate Heart: Regular rate and rhythm no rubs murmurs or extra sounds Lungs: Clear to auscultation, breathing unlabored, no rales or wheezes Abdomen: Positive bowel sounds, soft nontender to palpation, nondistended Extremities: No clubbing, cyanosis, or edema Skin: No evidence of breakdown, no evidence of rash, calluses on knees   PRIOR EXAMS: Neurologic:follows simple verbal commands , motor strength is 5/5 in Left and 0/5 right deltoid, bicep,trace  tricep,2- grip, hip flexor, knee extensors, ankle dorsiflexor and plantar flexor Sensory exam normal sensation to light touch and proprioception in bilateral upper and lower extremities Cerebellar exam unable to do on right due to weakness  Musculoskeletal: Full range of motion in all 4 extremities. No joint swelling   Assessment/Plan: 1. Functional deficits which require 3+ hours per day of interdisciplinary therapy in a comprehensive inpatient rehab setting. Physiatrist is providing close team supervision and 24 hour management of active medical problems listed below. Physiatrist and rehab team  continue to assess barriers to discharge/monitor patient progress toward functional and medical goals  Care Tool:  Bathing    Body parts bathed by patient: Abdomen, Chest   Body parts bathed by helper: Right arm, Left arm, Front perineal area, Buttocks, Right upper leg, Left upper leg, Right lower leg, Left lower leg     Bathing assist Assist Level: Total Assistance - Patient < 25%     Upper Body Dressing/Undressing Upper body dressing   What is the patient wearing?: Pull over shirt    Upper body assist Assist Level: Maximal Assistance - Patient 25 - 49%    Lower Body Dressing/Undressing Lower body dressing      What is the patient wearing?: Pants, Incontinence brief     Lower body assist Assist for lower body dressing: Maximal Assistance - Patient 25 - 49%     Toileting Toileting    Toileting assist Assist for toileting: 2 Helpers     Transfers Chair/bed transfer  Transfers assist  Chair/bed transfer activity did not occur: N/A  Chair/bed transfer assist level: Moderate Assistance - Patient 50 - 74% (squat pivot)     Locomotion Ambulation   Ambulation assist      Assist level: 2 helpers Assistive device: Other (comment) (3 Musketeer) Max distance: 165ft   Walk 10 feet activity   Assist  Walk 10  feet activity did not occur: Safety/medical concerns  Assist level: 2 helpers Assistive device: Other (comment) (3 Musketeer)   Walk 50 feet activity   Assist Walk 50 feet with 2 turns activity did not occur: Safety/medical concerns         Walk 150 feet activity   Assist Walk 150 feet activity did not occur: Safety/medical concerns         Walk 10 feet on uneven surface  activity   Assist Walk 10 feet on uneven surfaces activity did not occur: Safety/medical concerns         Wheelchair     Assist Is the patient using a wheelchair?: Yes Type of Wheelchair: Manual (TIS w/c)    Wheelchair assist level: Dependent - Patient  0%      Wheelchair 50 feet with 2 turns activity    Assist        Assist Level: Dependent - Patient 0%   Wheelchair 150 feet activity     Assist      Assist Level: Dependent - Patient 0%   Blood pressure 126/87, pulse 92, temperature 97.9 F (36.6 C), temperature source Oral, resp. rate 18, height 5\' 9"  (1.753 m), weight 107.7 kg, SpO2 100 %.  Medical Problem List and Plan: 1. 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  Antithrombotics: -DVT/anticoagulation:  Pharmaceutical: Heparin 5000U q8h             -antiplatelet therapy: Aspirin 81 mg daily and Brilinta 90 mg twice daily 3. Pain Management: Tylenol as needed 4. Mood/Behavior/Sleep: Provide emotional support             -antipsychotic agents: N/A 5. Neuropsych/cognition: This patient is not capable of making decisions on his own behalf. 6. Skin/Wound Care: Routine skin checks 7. Fluids/Electrolytes/Nutrition: Routine in and outs with follow-up chemistries -08/05/22 CBGs are being checked TID AC/HS, unclear why, no hx of DM2, and Hgb A1C 5.6% on 07/25/22; will decrease CBG checks to AM+QHS, if remaining normal, would consider d/c SSI and CBG checks per primary team 8.  Seizure prophylaxis.  EEG negative.  Continue Keppra 500 mg daily 9.  AKI on CKD stage IV.  Follow-up chemistries.  Follow-up outpatient nephrology services             -pt given lokelma prior to rehab admit             -HyperK+ Nephro to manage  improved    Latest Ref Rng & Units 08/07/2022    7:30 AM 08/06/2022    6:07 AM 08/05/2022    6:07 AM  BMP  Glucose 70 - 99 mg/dL 161  096  99   BUN 6 - 20 mg/dL 90  90  86   Creatinine 0.61 - 1.24 mg/dL 0.45  4.09  8.11   Sodium 135 - 145 mmol/L 136  137  139   Potassium 3.5 - 5.1 mmol/L 4.2  5.3  5.1   Chloride 98 - 111 mmol/L 106  107   106   CO2 22 - 32 mmol/L 17  18  20    Calcium 8.9 - 10.3 mg/dL 9.3  9.3  9.5      10.  Hyperlipidemia.  Crestor  20mg  QD 11.  Permissive hypertension.  Patient on amlodipine 10 mg daily, hydralazine 50 mg twice daily, labetalol 200 mg twice daily prior to admission.  Resume as needed  5/16 controlled Chlorthalidone 25mg  QD, monitor Vitals:   08/06/22 1248 08/06/22 2015 08/07/22 0520 08/07/22 1246  BP: 135/87 (!) 140/97 (!) 122/95 137/89   08/07/22 2037 08/08/22 0453 08/08/22 1524 08/08/22 1944  BP: 129/85 118/84 (!) 139/94 (!) 137/99   08/09/22 0543 08/09/22 1329 08/09/22 1954 08/10/22 0459  BP: 115/87 (!) 142/89 (!) 130/96 126/87    12.  Medical noncompliance.  Provide counseling 13. Constipation: -08/05/22 no BM in 2 days per pt, documentation states LBM yesterday; will monitor for now, cont Colace 100mg  BID and miralax 17g QD, if no BM tomorrow then may need to adjust Loose BM x 2 yesterday  Incontinent with urgency, pt states he has some sensation of need , may need to develop means of communicating need for BM  LOS: 9 days A FACE TO FACE EVALUATION WAS PERFORMED  Erick Colace 08/10/2022, 8:46 AM

## 2022-08-10 NOTE — Group Note (Signed)
Patient Details Name: Benicio Larranaga MRN: [id number] DOB: [birth year] Today's Date: 08/10/2022  Time Calculation: OT Group Time Calculation OT Group Start Time: 1430 OT Group Stop Time: 1530 OT Group Time Calculation (min): 60 min      Group Description: Dance Group: Pt participated in dance group with an emphasis on social interaction, motor planning, increasing overall activity tolerance and bimanual tasks. All songs were selected by group members. Dance moves included AROM of BUE/BLE gross motor movements with an emphasis on building functional endurance.    Individual level documentation: Patient completed group from sitting level. Patientt needed min to complete various dance moves with cues for motivation, body mechanics, and modifications.  Patient needed mod modifications during group.  Pain:  0/10     Army Fossa 08/10/2022, 4:46 PM

## 2022-08-10 NOTE — Progress Notes (Signed)
Physical Therapy Session Note  Patient Details  Name: Joseph Bright MRN: [id number] Date of Birth: 06/16/1975  Today's Date: 08/10/2022 PT Individual Time: 2956-2130 PT Individual Time Calculation (min): 46 min   Short Term Goals: Week 2:  PT Short Term Goal 1 (Week 2): Pt will perform supine<>sit with CGA PT Short Term Goal 2 (Week 2): Pt will perform sit<>stands using LRAD with min assist PT Short Term Goal 3 (Week 2): Pt will perform bed<>chair transfers using LRAD with min assist PT Short Term Goal 4 (Week 2): Pt will ambulate at least 76ft using LRAD with mod assist of 1 and +2 assist as needed PT Short Term Goal 5 (Week 2): Pt will navigate 4 stairs using B HRs with mod assist of 1 and +2 for safety as needed  Skilled Therapeutic Interventions/Progress Updates:    Pt received sitting in w/c and agreeable to therapy session.  Transported to/from gym in w/c for time management and energy conservation.   Pt already wearing R LE Matrix MAX GRAFO.   Vitals in sitting to start session: BP 133/98 (MAP 109), HR 84bpm   Sit>stand w/c>L UE support on litegait handle with min/mod assist of 1. Standing with CGA/min assist of 1 while donning litegait harness total assist.   Gait training ~155ft using litegait harness for mod/max partial body weight support (BWS) due to no +2 assist being available with therapist providing max/total assist for R LE management and frequent assist for litegait management although pt driving it. Pt demonstrating the following gait deviations with therapist providing the described cuing and facilitation for improvement:  - used ACE wrap to support R hand on litegait handle - used black bungee cord on R anterior hip to promote forward pelvic movement and increased R hip extension - pt continues to demo increasing activation in R quad during stance phase, with partial BWS pt able to maintain stability with mod assist to facilitate increased R hip/knee extension -  continues to benefit from facilitation for longer R weight shift onto R stance limb to allow reciprocal step with L LE - donned shoe toe-cap on R LE to allow improved ability to advance during swing, but continues to require total assist  Pt reports fatigue after gait training.  Vitals after gait: BP 142/101 (MAP 112), HR 105bpm, SpO2 99%  Doffed litegait harness as described above. Transported back to room and left in care of nursing staff for toileting.   Therapy Documentation Precautions:  Precautions Precautions: Fall Precaution Comments: right sided hemiparesis, expressive deficits Restrictions Weight Bearing Restrictions: No   Pain: Reports muscle soreness in R LE but denies pain.    Therapy/Group: Individual Therapy  Ginny Forth , PT, DPT, NCS, CSRS 08/10/2022, 1:36 PM

## 2022-08-10 NOTE — Progress Notes (Signed)
Speech Language Pathology Weekly Progress and Session Note  Patient Details  Name: Joseph Bright MRN: [id number] Date of Birth: 09/24/1975  Beginning of progress report period: Aug 03, 2022 End of progress report period: Aug 10, 2022  Today's Date: 08/10/2022 SLP Individual Time: 1100-1156 SLP Individual Time Calculation (min): 56 min  Short Term Goals: Week 1: SLP Short Term Goal 1 (Week 1): Pt will follow 2-3 step commands with >75% accuracy given min cues. SLP Short Term Goal 1 - Progress (Week 1): Met SLP Short Term Goal 2 (Week 1): Pt will utilize compensatory word-finding strategies to repair communication breakdowns with >50% accuracy given min cues. SLP Short Term Goal 2 - Progress (Week 1): Updated due to goal met SLP Short Term Goal 3 (Week 1): Pt wil complete object description tasks with >75% accuracy given min cues. SLP Short Term Goal 3 - Progress (Week 1): Met    New Short Term Goals: Week 2: SLP Short Term Goal 1 (Week 2): Pt will utilize compensatory word-finding strategies to repair communication breakdowns with 75% accuracy given sup cues. SLP Short Term Goal 2 (Week 2): Pt will complete picture descriptions with 80% acc given min (syllabic and carrier phrase) cues. SLP Short Term Goal 3 (Week 2): Pt will answer complex and abstract yes/ no questions with 90% acc in conjunction with min cues.  Weekly Progress Updates: Patient has made steady gains and has met 3 of 3 STG's this reporting period due to improved ability to follow multi step directions, describe objectsand use compensatory strategies. Currently, pt continued to require min A for expressive language. Pt/ fmaily education ongoing. Pt would benefit from continued skilled SLP intervention to maximize expressive languagein order to maximize his functional independence prior to discharge.   Intensity: Minumum of 1-2 x/day, 30 to 90 minutes Frequency: 1 to 3 out of 7 days Duration/Length of Stay: June  6 Treatment/Interventions: Patient/family education;Therapeutic Activities;Cueing hierarchy;Functional tasks;Speech/Language facilitation;Multimodal communication approach   Daily Session  Skilled Therapeutic Interventions: Pt was seen in am to address expressive language. Pt was alert and seated upright in WC upon SLP arrival. He was agreeable for session. SLP challenged in functional communication with pt able to answer biographical questions including name, DOB, and address with 100% acc. Pt noted with communication break downn and word finding deficits given complex conversation (I.e. explain what you do). SLP further challenged pt in verbalizing descriptions given common objects or familiar picture scenes. Pt completed task with 93% acc given min A for additional and specific details. In additional minutes of session, SLP challenged pt in structured task of utilizing circumlocution and substitution strategies to describe common objects. Pt completed task with sup to min A for thoroughness of description. Direct hand off to NT at close of session. SLP to continue POC.     Pain Pain Assessment Pain Scale: 0-10 Pain Score: 0-No pain  Therapy/Group: Individual Therapy  Renaee Munda 08/10/2022, 2:08 PM

## 2022-08-10 NOTE — Progress Notes (Signed)
Physical Therapy Session Note  Patient Details  Name: Joseph Bright MRN: [id number] Date of Birth: [birth year]  Today's Date: 08/10/2022 PT Individual Time: 7829-5621 PT Individual Time Calculation (min): 30 min   Short Term Goals: Week 1:  PT Short Term Goal 1 (Week 1): Pt will be able to perform mod assist transfers to the R PT Short Term Goal 1 - Progress (Week 1): Met PT Short Term Goal 2 (Week 1): Pt will be able to perform w/c propulsion with min assist x 50' PT Short Term Goal 2 - Progress (Week 1): Progressing toward goal PT Short Term Goal 3 (Week 1): Pt will be able to initiate gait training PT Short Term Goal 3 - Progress (Week 1): Met PT Short Term Goal 4 (Week 1): Pt will be able to perform bed mobility with min assist PT Short Term Goal 4 - Progress (Week 1): Met  Skilled Therapeutic Interventions/Progress Updates: Pt handed off from OT sitting at [facility].  Pt agreeable to therapy.  Pt performs multiple sit to stand transfers w/ mod A and verbal cues for forward lean, especially w/ controlled stand to sit.  Pt w/ increased c/o fatigue LLE 2/2 majority of WB.  Pt w/ LOB to right w/ increased WB to RLE, blocking of R knee.  Pt performed seated reaching outside of BOS crossing midline to place cones on table, facilitation for WB through RUE.  Pt performed squat pivot to L w/ Mod A and returned to room.  Pt remained sitting in w/c w/ chair alarm on and all needs in reach.     Therapy Documentation Precautions:  Precautions Precautions: Fall Precaution Comments: right sided hemiparesis, expressive deficits Restrictions Weight Bearing Restrictions: No General:   Vital Signs:   Pain:0/10 Pain Assessment Pain Scale: 0-10 Pain Score: 0-No pain  Therapy/Group: Individual Therapy  Lucio Edward 08/10/2022, 10:16 AM

## 2022-08-10 NOTE — Progress Notes (Signed)
Occupational Therapy Session Note  Patient Details  Name: Joseph Bright MRN: [id number] Date of Birth: 1976-02-26  Today's Date: 08/10/2022 OT Individual Time: 4098-1191 OT Individual Time Calculation (min): 28 min    Short Term Goals: Week 2:  OT Short Term Goal 1 (Week 2): Pt will recall hemidressing technique with min questioning cus consistently OT Short Term Goal 2 (Week 2): Pt will complete squat pivot transfers in both directions with min A OT Short Term Goal 3 (Week 2): Pt will maintain standing balance with min A consistently for BADLs LRAD PRN OT Short Term Goal 4 (Week 2): Pt will don pants mod A consistently  Skilled Therapeutic Interventions/Progress Updates:     Pt received sitting up in wc dressed and ready for the deny politely denying need for BADLs this AM. Pt presenting to be in good spirits receptive to skilled OT session reporting 0/10 pain- OT offering intermittent rest breaks, repositioning, and therapeutic support to optimize participation in therapy session. Transported Pt total A to therapy gym in wc for time management and energy conservation. Pt completed squat pivot wc>EOM min A with OT facilitating weightbearing and stabilization through RLE. Focus this session RUE NMR with focus on increasing functional use for BADLs. Engaged Pt in [place] of RUE with OT providing gentle resistance and mod A to move through full bicep flexion/tricep extension, internal/external rotation, and shoulder circumduction. Applied Sabeo E-stim to dorsal forearm to facilitate wrist extension and then engaged pt in simple grasp and release of common household item of a cup with Pt able to actively maintain grasp on cup with mod VB cueing and extend digits/wrist with mod A form OT and assist form Sabeo E-stim system. Graded up activity to incorporate bringing cup to mouth with Pt able to maintain grasp and bring cup 75% way to mouth with CGA demonstrating increased bicep activation this session.  Sabeo remained on Pt at 4-clicks following session with PT informed and receptive to checking skin when Sabeo was finished ~35 minutes into their session. Pt was handed off to PT fo upcoming session in therapy gym with all needs met. (Spoke with PT following session, Sabeo still running at the end of his session so OT returned to remove it with no skin reaction noted).   Therapy Documentation Precautions:  Precautions Precautions: Fall Precaution Comments: right sided hemiparesis, expressive deficits Restrictions Weight Bearing Restrictions: No  Therapy/Group: Individual Therapy  Army Fossa 08/10/2022, 8:05 AM

## 2022-08-11 DIAGNOSIS — I6329 Cerebral infarction due to unspecified occlusion or stenosis of other precerebral arteries: Secondary | ICD-10-CM | POA: Diagnosis not present

## 2022-08-11 LAB — GLUCOSE, CAPILLARY
Glucose-Capillary: 88 mg/dL (ref 70–99)
Glucose-Capillary: 211 mg/dL — ABNORMAL HIGH (ref 70–99)
Glucose-Capillary: 84 mg/dL (ref 70–99)
Glucose-Capillary: 98 mg/dL (ref 70–99)

## 2022-08-11 NOTE — Progress Notes (Addendum)
PROGRESS NOTE   Subjective/Complaints:  No issues overnite , exp language improving  ROS-  limited by exp aphasia   Objective:   No results found. No results for input(s): "WBC", "HGB", "HCT", "PLT" in the last 72 hours.   No results for input(s): "NA", "K", "CL", "CO2", "GLUCOSE", "BUN", "CREATININE", "CALCIUM" in the last 72 hours.   Intake/Output Summary (Last 24 hours) at 08/11/2022 0838 Last data filed at 08/11/2022 0700 Gross per 24 hour  Intake 710 ml  Output 1245 ml  Net -535 ml         Physical Exam: Vital Signs Blood pressure 132/87, pulse 69, temperature 98.4 F (36.9 C), temperature source Oral, resp. rate 18, height 5\' 9"  (1.753 m), weight 107.7 kg, SpO2 100 %.   General: No acute distress, laying in bed Mood and affect are appropriate Heart: Regular rate and rhythm no rubs murmurs or extra sounds Lungs: Clear to auscultation, breathing unlabored, no rales or wheezes Abdomen: Positive bowel sounds, soft nontender to palpation, nondistended Extremities: No clubbing, cyanosis, or edema Skin: No evidence of breakdown, no evidence of rash, calluses on knees   PRIOR EXAMS: Neurologic:follows simple verbal commands , motor strength is 5/5 in Left and 0/5 right deltoid, bicep,trace  tricep,2- grip, hip flexor, knee extensors, ankle dorsiflexor and plantar flexor Sensory exam normal sensation to light touch and proprioception in bilateral upper and lower extremities Cerebellar exam unable to do on right due to weakness  Musculoskeletal: Full range of motion in all 4 extremities. No joint swelling  Speech able to name 3/3 objects , also appropriate short phrases   Assessment/Plan: 1. Functional deficits which require 3+ hours per day of interdisciplinary therapy in a comprehensive inpatient rehab setting. Physiatrist is providing close team supervision and 24 hour management of active medical problems  listed below. Physiatrist and rehab team continue to assess barriers to discharge/monitor patient progress toward functional and medical goals  Care Tool:  Bathing    Body parts bathed by patient: Abdomen, Chest   Body parts bathed by helper: Right arm, Left arm, Front perineal area, Buttocks, Right upper leg, Left upper leg, Right lower leg, Left lower leg     Bathing assist Assist Level: Total Assistance - Patient < 25%     Upper Body Dressing/Undressing Upper body dressing   What is the patient wearing?: Pull over shirt    Upper body assist Assist Level: Maximal Assistance - Patient 25 - 49%    Lower Body Dressing/Undressing Lower body dressing      What is the patient wearing?: Pants, Incontinence brief     Lower body assist Assist for lower body dressing: Maximal Assistance - Patient 25 - 49%     Toileting Toileting    Toileting assist Assist for toileting: 2 Helpers     Transfers Chair/bed transfer  Transfers assist  Chair/bed transfer activity did not occur: N/A  Chair/bed transfer assist level: Moderate Assistance - Patient 50 - 74%     Locomotion Ambulation   Ambulation assist      Assist level: 2 helpers Assistive device: Other (comment) (3 Musketeer) Max distance: 120ft   Walk 10 feet activity   Assist  Walk 10 feet activity did not occur: Safety/medical concerns  Assist level: 2 helpers Assistive device: Other (comment) (3 Musketeer)   Walk 50 feet activity   Assist Walk 50 feet with 2 turns activity did not occur: Safety/medical concerns         Walk 150 feet activity   Assist Walk 150 feet activity did not occur: Safety/medical concerns         Walk 10 feet on uneven surface  activity   Assist Walk 10 feet on uneven surfaces activity did not occur: Safety/medical concerns         Wheelchair     Assist Is the patient using a wheelchair?: Yes Type of Wheelchair: Manual (TIS w/c)    Wheelchair assist  level: Dependent - Patient 0%      Wheelchair 50 feet with 2 turns activity    Assist        Assist Level: Dependent - Patient 0%   Wheelchair 150 feet activity     Assist      Assist Level: Dependent - Patient 0%   Blood pressure 132/87, pulse 69, temperature 98.4 F (36.9 C), temperature source Oral, resp. rate 18, height 5\' 9"  (1.753 m), weight 107.7 kg, SpO2 100 %.  Medical Problem List and Plan: 1. 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  Antithrombotics: -DVT/anticoagulation:  Pharmaceutical: Heparin 5000U q8h             -antiplatelet therapy: Aspirin 81 mg daily and Brilinta 90 mg twice daily 3. Pain Management: Tylenol as needed 4. Mood/Behavior/Sleep: Provide emotional support             -antipsychotic agents: N/A 5. Neuropsych/cognition: This patient is not capable of making decisions on his own behalf. 6. Skin/Wound Care: Routine skin checks 7. Fluids/Electrolytes/Nutrition: Routine in and outs with follow-up chemistries -08/05/22 CBGs are being checked TID AC/HS, unclear why, no hx of DM2, and Hgb A1C 5.6% on 07/25/22; will decrease CBG checks to AM+QHS, if remaining normal, would consider d/c SSI and CBG checks per primary team 8.  Seizure prophylaxis.  EEG negative.  Continue Keppra 500 mg daily 9.  AKI on CKD stage IV.  Follow-up chemistries.  Follow-up outpatient nephrology services             -pt given lokelma prior to rehab admit             -[facility] has signed off , recheck 5/20 improved    Latest Ref Rng & Units 08/07/2022    7:30 AM 08/06/2022    6:07 AM 08/05/2022    6:07 AM  BMP  Glucose 70 - 99 mg/dL 098  119  99   BUN 6 - 20 mg/dL 90  90  86   Creatinine 0.61 - 1.24 mg/dL 1.47  8.29  5.62   Sodium 135 - 145 mmol/L 136  137   139   Potassium 3.5 - 5.1 mmol/L 4.2  5.3  5.1   Chloride 98 - 111 mmol/L 106  107  106   CO2 22 - 32 mmol/L 17  18  20    Calcium 8.9 - 10.3 mg/dL 9.3  9.3  9.5      10.  Hyperlipidemia.  Crestor 20mg  QD 11.  Permissive hypertension.  Patient on amlodipine 10 mg daily, hydralazine 50 mg twice daily, labetalol 200 mg twice daily prior to admission.  Resume as needed  5/17 controlled Chlorthalidone 25mg  QD, monitor Vitals:   08/07/22 1246 08/07/22 2037 08/08/22 0453 08/08/22 1524  BP: 137/89 129/85 118/84 (!) 139/94   08/08/22 1944 08/09/22 0543 08/09/22 1329 08/09/22 1954  BP: (!) 137/99 115/87 (!) 142/89 (!) 130/96   08/10/22 0459 08/10/22 1317 08/10/22 2021 08/11/22 0433  BP: 126/87 123/87 113/68 132/87    12.  Medical noncompliance.  Provide counseling 13. Constipation: -08/05/22 no BM in 2 days per pt, documentation states LBM yesterday; will monitor for now, cont Colace 100mg  BID and miralax 17g QD, if no BM tomorrow then may need to adjust Loose BM x 2 yesterday  Incontinent with urgency, pt states he has some sensation of need , may need to develop means of communicating need for BM  LOS: 10 days A FACE TO FACE EVALUATION WAS PERFORMED  Erick Colace 08/11/2022, 8:38 AM

## 2022-08-11 NOTE — Progress Notes (Signed)
Physical Therapy Session Note  Patient Details  Name: Joseph Bright MRN: [id number] Date of Birth: [birth year]  Today's Date: 08/11/2022 PT Individual Time: 4098-1191 PT Individual Time Calculation (min): 55 min   Short Term Goals: Week 1:  PT Short Term Goal 1 (Week 1): Pt will be able to perform mod assist transfers to the R PT Short Term Goal 1 - Progress (Week 1): Met PT Short Term Goal 2 (Week 1): Pt will be able to perform w/c propulsion with min assist x 50' PT Short Term Goal 2 - Progress (Week 1): Progressing toward goal PT Short Term Goal 3 (Week 1): Pt will be able to initiate gait training PT Short Term Goal 3 - Progress (Week 1): Met PT Short Term Goal 4 (Week 1): Pt will be able to perform bed mobility with min assist PT Short Term Goal 4 - Progress (Week 1): Met Week 2:  PT Short Term Goal 1 (Week 2): Pt will perform supine<>sit with CGA PT Short Term Goal 2 (Week 2): Pt will perform sit<>stands using LRAD with min assist PT Short Term Goal 3 (Week 2): Pt will perform bed<>chair transfers using LRAD with min assist PT Short Term Goal 4 (Week 2): Pt will ambulate at least 34ft using LRAD with mod assist of 1 and +2 assist as needed PT Short Term Goal 5 (Week 2): Pt will navigate 4 stairs using B HRs with mod assist of 1 and +2 for safety as needed  Skilled Therapeutic Interventions/Progress Updates:  Patient supine in bed on entrance to room. Patient alert and agreeable to PT session. Pt actually answering questions with full sentences throughout session!  Patient with no pain complaint at start of session.  Therapeutic Activity: Bed Mobility: Dressing initiated in supine. TotA for threading RLE of shorts, MaxA overall with hold to RLE in hooklying for attempt in bridge for pt to pull shorts up completely. Pt performed supine --> sit with roll to R side with supervision and vc for technique, then pushing up to seated position with MinA.  Transfers: Pt relates need to  toilet. STEDY used for ease of transfer. Pt pulls up to stand with CGA/ MinA with both hands on pull bar. MinA for controlling eccentric sit to toilet. Max/ TotA for brief change and posterior pericare in stance in STEDY. With MaxA for standing balance, pt given washcloth for frontal pericare with LUE. MinA for controlling stand-->sit to w/c. Once seated, Max/ TotA for donning socks, ankle brace, and shoes with AFO.  Pt performed sit<>stand transfers throughout session at hallway HR to LUE with MinA. Provided verbal cues for sequencing forward scoot and RLE positioning prior to transfer.  Gait Training:  Pt ambulated 35' x2 using L hallway handrail with MaxA for RLE advancement and knee stability. Pt with significantly improved pacing. Light, quick quad activation noted in RLE at start of stance phase with initiation of tibia forward. Provided vc/ tc for sequencing lateral weight shift and RLE advancement.  Neuromuscular Re-ed: NMR facilitated during session with focus on standing balance. Pt guided in attempt to produce quad activation in RLE during stance. Minisquats initiated to limit of AFO/ ankle brace. Activation noted in small ROM near extension of R knee. Activation also noted during ambulation and more functional motion during initiation of stance phase to RLE with initiation of L hip moving forward. Continues to require steady L hand hold in order to maintain balance with bias to L side.   NMR performed for improvements in  motor control and coordination, balance, sequencing, judgement, and self confidence/ efficacy in performing all aspects of mobility at highest level of independence.   Patient seated upright in w/c at end of session with brakes locked, belt alarm set, and all needs within reach.  Therapy Documentation Precautions:  Precautions Precautions: Fall Precaution Comments: right sided hemiparesis, expressive deficits Restrictions Weight Bearing Restrictions: No General:    Vital Signs: Therapy Vitals Temp: 98.4 F (36.9 C) Temp Source: Oral Pulse Rate: 69 Resp: 18 BP: 132/87 Patient Position (if appropriate): Lying Oxygen Therapy SpO2: 100 % O2 Device: Room Air Pain:  No pain related this session.  Therapy/Group: Individual Therapy  Loel Dubonnet PT, DPT, CSRS 08/11/2022, 8:05 AM

## 2022-08-11 NOTE — Progress Notes (Signed)
Speech Language Pathology Daily Session Note  Patient Details  Name: Joseph Bright MRN: [id number] Date of Birth: [birth year]  Today's Date: 08/11/2022 SLP Individual Time: 4098-1191 SLP Individual Time Calculation (min): 45 min  Short Term Goals: Week 2: SLP Short Term Goal 1 (Week 2): Pt will utilize compensatory word-finding strategies to repair communication breakdowns with 75% accuracy given sup cues. SLP Short Term Goal 2 (Week 2): Pt will complete picture descriptions with 80% acc given min (syllabic and carrier phrase) cues. SLP Short Term Goal 3 (Week 2): Pt will answer complex and abstract yes/ no questions with 90% acc in conjunction with min cues.  Skilled Therapeutic Interventions:  Pt was seen in am to address cognitive re- trainng. Pt was alert and seated upright in WC upon SLP arrival and agreeable for session. SLP addressing answering wh questions and use of compensatory word finding strategies. Pt answered wh questions regarding personal hx requiring min A for thoroughness and use of compensatory strategies. Pt presented with some hesitations with inability to successfully verbalize thought c/b isaying, "I don't know." SLP instructed pt in rephrasing and provided models of circumlocution strategies. Pt was left seated upright in [address] [facility] with chair alarm active and call button within reach. SLP to continue POC.  Pain Pain Assessment Pain Scale: 0-10 Pain Score: 0-No pain  Therapy/Group: Individual Therapy  Renaee Munda 08/11/2022, 10:30 AM

## 2022-08-11 NOTE — Progress Notes (Signed)
Occupational Therapy Session Note  Patient Details  Name: Joseph Bright MRN: [id number] Date of Birth: Jan 25, 1976  Today's Date: 08/11/2022 OT Individual Time: 1103-1200 OT Individual Time Calculation (min): 57 min  OT Individual Time: 1447-1530 OT Individual Time Calculation (min): 43 min   Short Term Goals: Week 2:  OT Short Term Goal 1 (Week 2): Pt will recall hemidressing technique with min questioning cus consistently OT Short Term Goal 2 (Week 2): Pt will complete squat pivot transfers in both directions with min A OT Short Term Goal 3 (Week 2): Pt will maintain standing balance with min A consistently for BADLs LRAD PRN OT Short Term Goal 4 (Week 2): Pt will don pants mod A consistently  Skilled Therapeutic Interventions/Progress Updates:     Pt received sitting up in wc presenting to be in good spirits receptive to skilled OT session reporting 0/10 pain- OT offering intermittent rest breaks, repositioning, and therapeutic support to optimize participation in therapy session. Pt dressed and ready for the day upon OT arrival with all BADL needs met. Pt transported total A to therapy gym in wc for time management and energy conservation. Focus this session RUE NMR to increase functional use for BADL and IADL tasks.   Squat pivot to L side wc>EOM min A with min cueing for technique.   Guided Pt through [facility] functional reaching patterns of reaching towards anterior target, bringing hand to mouth, and bringing hand to should with improved bicep activation this session. Pt able to bring hand 75% to mouth with light min A and mod VB cueing to prevent compensatory head movements. Pt also with improved distal muscle activation return this session with Pt able to extend fingers and grasp through 75% ROM. Worked on maintaining grasp while transporting bean bag from table and dropping onto the floor with Pt able to maintain grasp and open hand to release with mod A provided at wrist and elbow and  mod VB cueing for motivation and awareness.   NMES applied to tricep and dorsal forearm to help increased muscle activation during functional reaching with improved wrist extension and elbow extension.  Ratio 1:3 Rate 35 pps Waveform- Asymmetric Ramp 1.0 Pulse 300 Intensity- 26 Duration - 20 minutes   Pt with no pain reported with NMES applied with positive response to E-stim noted. Engaged pt in functional reaching activities with NMES donned to simulate drinking from a cup with Pt instructed to reach for cup during "on" phase and grasp cup and bring to mouth during off phase. Pt able to complete with min A at elbow and mod VB cues for motivation/technique. Pt able to maintain grasp and release cup with min assist to stabilize cup.   Graded up activity to increase forward reaching at shoulder level to retrieve squigz from mirror for increase wrist extension and targeted reach challenge. Pt able to complete with mod A under elbow to facilitate shoulder flexion with Pt able to maintain resisted grasp to remove squigz and extend fingers to release.   No adverse reactions after treatment and is skin intact.    Pt transported back to room total A in wc. Pt was left resting in wc with call bell in reach, seat belt alarm on, and all needs met.   PM Session:  Pt received sitting up in wc presenting to be in good spirits receptive to skilled OT session reporting 0/10 pain- OT offering intermittent rest breaks, repositioning, and therapeutic support to optimize participation in therapy session. Pt transported total A  to therapy gym in wc for time management and energy conservation. Pt participation in OT session with focus on RUE NMR to increase overall RUE functional use during BADLs.   Utilized UE-[place]anger to facilitate AAROM in gravity decreased plane. Engaged Pt in completing massed practice of pushing/pulling movement (shoulder retraction/protraction and bicep flexion/extension) and shoulder  circumduction with internal/external rotation. OT providing tactile cues for muscle activation throughout session and max VB cues for technique and motivation. Pt fatiguing quickly during task requiring moderate rest breaks. Pt able to initiate elbow flexion/extension ~25% with assistance provided to move through full ROM.   Graded up task to incorporate weightbearing through RUE by placing Pt's hand on wash cloth on table and pushing/pulling wash cloth with targets provided for external visual cue. Pt able to complete 3x5 reps with rest breaks provided in between trials.   Utilized non-weighted dowel to facilitate AAROM of R shoulder. Pt instructed to bring dowel to shoulder height with BUEs grasping dowel. Pt initially requiring mod A to bring RUE to shoulder height with LUE assist, however with increased time and effort, Pt able to lift/lower dowel with CGA maintaining grasps with mod VB cueing for attention.   Pt transported back to room total A in wc. Pt was left resting in wc with call bell in reach, seat belt alarm on, and all needs met.    Therapy Documentation Precautions:  Precautions Precautions: Fall Precaution Comments: right sided hemiparesis, expressive deficits Restrictions Weight Bearing Restrictions: No Vital Signs: Therapy Vitals Temp: 98.4 F (36.9 C) Temp Source: Oral Pulse Rate: 69 Resp: 18 BP: 132/87 Patient Position (if appropriate): Lying Oxygen Therapy SpO2: 100 % O2 Device: Room Air   Therapy/Group: Individual Therapy  Army Fossa 08/11/2022, 8:00 AM

## 2022-08-12 LAB — GLUCOSE, CAPILLARY
Glucose-Capillary: 98 mg/dL (ref 70–99)
Glucose-Capillary: 95 mg/dL (ref 70–99)
Glucose-Capillary: 121 mg/dL — ABNORMAL HIGH (ref 70–99)
Glucose-Capillary: 83 mg/dL (ref 70–99)

## 2022-08-12 NOTE — Progress Notes (Signed)
Occupational Therapy Session Note  Patient Details  Name: Joseph Bright MRN: [id number] Date of Birth: [birth year]  Today's Date: 08/12/2022 OT Individual Time: 0100-0145 OT Individual Time Calculation (min): 45 min    Short Term Goals: Week 1:  OT Short Term Goal 1 (Week 1): PT will transfer to the toilet/ BSC with mod A in both directions OT Short Term Goal 1 - Progress (Week 1): Met OT Short Term Goal 2 (Week 1): Pt will don shirt wtih mod A  unsupported. OT Short Term Goal 2 - Progress (Week 1): Met OT Short Term Goal 3 (Week 1): Pt will perform bed mobility to come to EOB in prep for ADLs- with mod A consistency OT Short Term Goal 3 - Progress (Week 1): Met OT Short Term Goal 4 (Week 1): Pt will perform sit to stands with min A consistency for clothing management. OT Short Term Goal 4 - Progress (Week 1): Met  Skilled Therapeutic Interventions/Progress Updates:    The pt was seated at w/c LOF at the time of arrival, the pt indicated that he rested well and he had no pain at the time of treatment. The pt was instructed in relation to good anatomical positioning of his RUE to minimize his rist for injury to underlying structures. The pt in agreement with UB exercises and NMR to improve the fluency of his movements. The pt tolerated manual manipulation of the scapular , bilateral shld,  and neck to aid with functional return.  The pt was very receptive to treatment. The pt went on to complete exercises involving functional grip and AROM of the RUE.  The pt was also engaged in UB exercises iusing the 1lb dowel in BUE involving shld flexion and horizontal abduction, 2 sets of 10 with rest breaks as needed, the pt required 3 rest breaks. The pt was instructed ti visualize the task and include his sight for observing the extremity during active engagement.  At the end of the session, the pt remained at w/c LOF with his RUE supported with pillows, his call light and bedside table within reach and  all additional needs addressed.   Therapy Documentation Precautions:  Precautions Precautions: Fall Precaution Comments: right sided hemiparesis, expressive deficits Restrictions Weight Bearing Restrictions: No  Therapy/Group: Individual Therapy  Lavona Mound 08/12/2022, 4:35 PM

## 2022-08-12 NOTE — Progress Notes (Signed)
PROGRESS NOTE   Subjective/Complaints:  Pt without complaints. Slept well, denies pain, LBM yesterday, urinating well, no other complaints or concerns.   ROS-  limited by exp aphasia   Objective:   No results found. No results for input(s): "WBC", "HGB", "HCT", "PLT" in the last 72 hours.   No results for input(s): "NA", "K", "CL", "CO2", "GLUCOSE", "BUN", "CREATININE", "CALCIUM" in the last 72 hours.   Intake/Output Summary (Last 24 hours) at 08/12/2022 1156 Last data filed at 08/12/2022 0720 Gross per 24 hour  Intake 597 ml  Output 300 ml  Net 297 ml        Physical Exam: Vital Signs Blood pressure 117/83, pulse 78, temperature 97.7 F (36.5 C), temperature source Oral, resp. rate 17, height 5\' 9"  (1.753 m), weight 107.7 kg, SpO2 100 %.   General: No acute distress, sitting in w/c plugging phone up Mood and affect are appropriate Heart: Regular rate and rhythm no rubs murmurs or extra sounds Lungs: Clear to auscultation, breathing unlabored, no rales or wheezes Abdomen: Positive bowel sounds, soft nontender to palpation, nondistended Extremities: No clubbing, cyanosis, or edema Skin: No evidence of breakdown, no evidence of rash, calluses on knees   PRIOR EXAMS: Neurologic:follows simple verbal commands , motor strength is 5/5 in Left and 0/5 right deltoid, bicep,trace  tricep,2- grip, hip flexor, knee extensors, ankle dorsiflexor and plantar flexor Sensory exam normal sensation to light touch and proprioception in bilateral upper and lower extremities Cerebellar exam unable to do on right due to weakness  Musculoskeletal: Full range of motion in all 4 extremities. No joint swelling  Speech able to name 3/3 objects , also appropriate short phrases   Assessment/Plan: 1. Functional deficits which require 3+ hours per day of interdisciplinary therapy in a comprehensive inpatient rehab setting. Physiatrist is  providing close team supervision and 24 hour management of active medical problems listed below. Physiatrist and rehab team continue to assess barriers to discharge/monitor patient progress toward functional and medical goals  Care Tool:  Bathing    Body parts bathed by patient: Abdomen, Chest   Body parts bathed by helper: Right arm, Left arm, Front perineal area, Buttocks, Right upper leg, Left upper leg, Right lower leg, Left lower leg     Bathing assist Assist Level: Total Assistance - Patient < 25%     Upper Body Dressing/Undressing Upper body dressing   What is the patient wearing?: Pull over shirt    Upper body assist Assist Level: Maximal Assistance - Patient 25 - 49%    Lower Body Dressing/Undressing Lower body dressing      What is the patient wearing?: Pants, Incontinence brief     Lower body assist Assist for lower body dressing: Maximal Assistance - Patient 25 - 49%     Toileting Toileting    Toileting assist Assist for toileting: 2 Helpers     Transfers Chair/bed transfer  Transfers assist  Chair/bed transfer activity did not occur: N/A  Chair/bed transfer assist level: Moderate Assistance - Patient 50 - 74%     Locomotion Ambulation   Ambulation assist      Assist level: 2 helpers Assistive device: Other (comment) (3 Musketeer)  Max distance: 175ft   Walk 10 feet activity   Assist  Walk 10 feet activity did not occur: Safety/medical concerns  Assist level: 2 helpers Assistive device: Other (comment) (3 Musketeer)   Walk 50 feet activity   Assist Walk 50 feet with 2 turns activity did not occur: Safety/medical concerns         Walk 150 feet activity   Assist Walk 150 feet activity did not occur: Safety/medical concerns         Walk 10 feet on uneven surface  activity   Assist Walk 10 feet on uneven surfaces activity did not occur: Safety/medical concerns         Wheelchair     Assist Is the patient using  a wheelchair?: Yes Type of Wheelchair: Manual (TIS w/c)    Wheelchair assist level: Dependent - Patient 0%      Wheelchair 50 feet with 2 turns activity    Assist        Assist Level: Dependent - Patient 0%   Wheelchair 150 feet activity     Assist      Assist Level: Dependent - Patient 0%   Blood pressure 117/83, pulse 78, temperature 97.7 F (36.5 C), temperature source Oral, resp. rate 17, height 5\' 9"  (1.753 m), weight 107.7 kg, SpO2 100 %.  Medical Problem List and Plan: 1. 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  Antithrombotics: -DVT/anticoagulation:  Pharmaceutical: Heparin 5000U q8h             -antiplatelet therapy: Aspirin 81 mg daily and Brilinta 90 mg twice daily 3. Pain Management: Tylenol as needed 4. Mood/Behavior/Sleep: Provide emotional support             -antipsychotic agents: N/A 5. Neuropsych/cognition: This patient is not capable of making decisions on his own behalf. 6. Skin/Wound Care: Routine skin checks 7. Fluids/Electrolytes/Nutrition: Routine in and outs with follow-up chemistries -08/05/22 CBGs are being checked TID AC/HS, unclear why, no hx of DM2, and Hgb A1C 5.6% on 07/25/22; will decrease CBG checks to AM+QHS, if remaining normal, would consider d/c SSI and CBG checks per primary team -08/12/22 CBGs very well controlled except one time yesterday; received 5U of insulin; continue to monitor CBG (last 3)  Recent Labs    08/11/22 2023 08/12/22 0608 08/12/22 1131  GLUCAP 98 98 95    8.  Seizure prophylaxis.  EEG negative.  Continue Keppra 500 mg daily 9.  AKI on CKD stage IV.  Follow-up chemistries.  Follow-up outpatient nephrology services             -pt given lokelma prior to rehab admit              -[facility] Nephro has signed off , recheck 5/20 improved    Latest Ref Rng & Units 08/07/2022    7:30 AM 08/06/2022    6:07 AM 08/05/2022    6:07 AM  BMP  Glucose 70 - 99 mg/dL 191  478  99   BUN 6 - 20 mg/dL 90  90  86   Creatinine 0.61 - 1.24 mg/dL 2.95  6.21  3.08   Sodium  135 - 145 mmol/L 136  137  139   Potassium 3.5 - 5.1 mmol/L 4.2  5.3  5.1   Chloride 98 - 111 mmol/L 106  107  106   CO2 22 - 32 mmol/L 17  18  20    Calcium 8.9 - 10.3 mg/dL 9.3  9.3  9.5      10.  Hyperlipidemia.  Crestor 20mg  QD 11.  Permissive hypertension.  Patient on amlodipine 10 mg daily, hydralazine 50 mg twice daily, labetalol 200 mg twice daily prior to admission.  Resume as needed  -5/17-18 controlled with Chlorthalidone 25mg  QD, monitor Vitals:   08/08/22 1524 08/08/22 1944 08/09/22 0543 08/09/22 1329  BP: (!) 139/94 (!) 137/99 115/87 (!) 142/89   08/09/22 1954 08/10/22 0459 08/10/22 1317 08/10/22 2021  BP: (!) 130/96 126/87 123/87 113/68   08/11/22 0433 08/11/22 1642 08/11/22 2022 08/12/22 0438  BP: 132/87 (!) 128/102 (!) 128/93 117/83    12.  Medical noncompliance.  Provide counseling 13. Constipation/incontinence of bowel: -08/05/22 no BM in 2 days per pt, documentation states LBM yesterday; will monitor for now, cont Colace 100mg  BID and miralax 17g QD, if no BM tomorrow then may need to adjust -Loose BM x 2 yesterday  -Incontinent with urgency, pt states he has some sensation of need , may need to develop means of communicating need for BM  -08/12/22 LBM yesterday, monitor  LOS: 11 days A FACE TO FACE EVALUATION WAS PERFORMED  [address] 08/12/2022, 11:56 AM

## 2022-08-12 NOTE — Progress Notes (Signed)
Physical Therapy Session Note  Patient Details  Name: Joseph Bright MRN: [id number] Date of Birth: Mar 26, 1976  Today's Date: 08/12/2022 PT Individual Time: 1430-1459 PT Individual Time Calculation (min): 29 min   Short Term Goals: Week 2:  PT Short Term Goal 1 (Week 2): Pt will perform supine<>sit with CGA PT Short Term Goal 2 (Week 2): Pt will perform sit<>stands using LRAD with min assist PT Short Term Goal 3 (Week 2): Pt will perform bed<>chair transfers using LRAD with min assist PT Short Term Goal 4 (Week 2): Pt will ambulate at least 38ft using LRAD with mod assist of 1 and +2 assist as needed PT Short Term Goal 5 (Week 2): Pt will navigate 4 stairs using B HRs with mod assist of 1 and +2 for safety as needed  Skilled Therapeutic Interventions/Progress Updates:      Direct handoff of care from NT to start with patient sitting in w/c. Pt agreeable to therapy without reports of pain.  Transported patient to day room rehab gym.  Repeated sit<>stands 2x10 with emphasis on "quick" transition to encourage power and reduce posterior bias. Limited forward weight shifting during stand.   Completed modA squat pivot transfer from w/c to mat table. Fair sitting balance while unsupported without any LOB. Completed dynamic standing balance tasks with PT guarding R side and caging R knee in case of buckling - tasks included shooting basketball in standing and standing beach ball taps to rehab tech. MinA for dynamic standing balance but fading to modA with dual-cog tasks and fatigue. Played patient self selected music to improve affect and boost participation.   Attempted to work on stance control and closed chain NMR for R side with TKE, toe taps, and "kicking" soccer ball but patient having significant knee buckling on R resulting in quick sitting. (All were attempted without UE support).  Returned to his room and ended session seated in w/c with safety belt alarm on and call bell within reach.  All needs met.      Therapy Documentation Precautions:  Precautions Precautions: Fall Precaution Comments: right sided hemiparesis, expressive deficits Restrictions Weight Bearing Restrictions: No General:    Therapy/Group: Individual Therapy  Cybele Maule P Stephie Xu  PT, DPT, CSRS  08/12/2022, 2:57 PM

## 2022-08-12 NOTE — Progress Notes (Signed)
Occupational Therapy Session Note  Patient Details  Name: Joseph Bright MRN: [id number] Date of Birth: [birth year]  Today's Date: 08/12/2022 OT Individual Time: 1020-1131 OT Individual Time Calculation (min): 71 min    Short Term Goals: Week 2:  OT Short Term Goal 1 (Week 2): Pt will recall hemidressing technique with min questioning cus consistently OT Short Term Goal 2 (Week 2): Pt will complete squat pivot transfers in both directions with min A OT Short Term Goal 3 (Week 2): Pt will maintain standing balance with min A consistently for BADLs LRAD PRN OT Short Term Goal 4 (Week 2): Pt will don pants mod A consistently  Skilled Therapeutic Interventions/Progress Updates:  Pt received sitting in WC for skilled OT session with focus on RUE NMR and standing tolerance/balance. Pt agreeable to interventions, demonstrating overall pleasant mood. Pt with no reports of pain. OT offering intermediate rest breaks and positioning suggestions throughout session to address potential pain/fatigue and maximize participation/safety in session.   Pt dependent for transport to all therapy locations for time management.   Sitting at high-low table, pt participates in series of scapular mobility/PROM exercises, including: -Protraction/Retraction -Elevation/Depression -Internal/External Rotation   Pt completes 1 set/~15 reps of each exercise, requiring HOH assistance to reach full-ROM. Pt demonstrating trace activation with elevation/depression. Pt then participates in grasp/release activity with use of tan thera-putty. Pt instructed in gross grip x5 reps of thera-putty for general warm-up, then requiring A to reach towards/retreive hoops placed in putty. Pt able to grasp/release hoops with increased time.   Session transitioned to standing tolerance and balance activity. Pt's RW equipped with saddle splint. Pt performs STS x3, all with Min A + cuing for safer hand placement. Pt then stands for ~2 mins with  Mod A + RW, reaching with LUE to retrieve/place resistive clips onto mirror. Pt requires multimodal cuing to activate R-quad and prevent knee buckling. Vitals monitored and recorded below:  At rest:  BP=128/90 O2=100 HR=81  First STS:  BP=128/105 O2=100 HR=80  Standing ~2 mins: BP=119/87 O2=98 HR=77 Pt with reports of dizziness, post standing activity.   Pt remained sitting in [facility] with all immediate needs met at end of session. Pt continues to be appropriate for skilled OT intervention to promote further functional independence.   Therapy Documentation Precautions:  Precautions Precautions: Fall Precaution Comments: right sided hemiparesis, expressive deficits Restrictions Weight Bearing Restrictions: No   Therapy/Group: Individual Therapy  Lou Cal, OTR/L, MSOT  08/12/2022, 5:34 AM

## 2022-08-12 NOTE — Progress Notes (Signed)
Pt placed himself on hospital CPAP machine for the night. No oxygen bleed in requirement.

## 2022-08-13 DIAGNOSIS — R739 Hyperglycemia, unspecified: Secondary | ICD-10-CM

## 2022-08-13 LAB — GLUCOSE, CAPILLARY
Glucose-Capillary: 122 mg/dL — ABNORMAL HIGH (ref 70–99)
Glucose-Capillary: 114 mg/dL — ABNORMAL HIGH (ref 70–99)
Glucose-Capillary: 89 mg/dL (ref 70–99)
Glucose-Capillary: 91 mg/dL (ref 70–99)

## 2022-08-13 NOTE — Progress Notes (Signed)
Physical Therapy Session Note  Patient Details  Name: Joseph Bright MRN: [id number] Date of Birth: 03-19-1976  Today's Date: 08/13/2022 PT Individual Time: 0945-1100 PT Individual Time Calculation (min): 75 min   Short Term Goals: Week 2:  PT Short Term Goal 1 (Week 2): Pt will perform supine<>sit with CGA PT Short Term Goal 2 (Week 2): Pt will perform sit<>stands using LRAD with min assist PT Short Term Goal 3 (Week 2): Pt will perform bed<>chair transfers using LRAD with min assist PT Short Term Goal 4 (Week 2): Pt will ambulate at least 63ft using LRAD with mod assist of 1 and +2 assist as needed PT Short Term Goal 5 (Week 2): Pt will navigate 4 stairs using B HRs with mod assist of 1 and +2 for safety as needed  Skilled Therapeutic Interventions/Progress Updates:   Pt received in room in wheelchair, agreeable to tx. Pt denies pain.  Therex: L LAQ 2.5 lbs x 20, 2.5 # hip flex/abduction/adduction combo lifting his LE over a disc used as a visual cue, L DF AROM x 20 seated, L 2.5# iso LAQ hip abduction/adduction combo x 20. R Hamstring dig against PT thigh x 20 with palpable hamstring contraction. R quad/hip flexor facilitation lifting off PT thigh (in SLR fashion). R hip extension with foot on floor with PT palpating pressure under his heel. With all R LE activities, PT with maximal education not to use compensatory muscles and PT monitoring visually and tactilely with pt able to self-correct. Core therex seated in w/c: forward trunk lean with core contraction x 20, Red weighted ball reach in L hand to neutral and then crossing midline x 20; oblique crunch in chair x 20 with 2 varying angles each. STS x 10 using hallway rail with pt holding onto rail with L hand with balance in between; standing tolerance exercises using hallway rail with pt holding with L hand with longest standing tolerance at 49 sec prior to rest break requests; Standing at hallway rail with pt holding rail with PT  facilitating R LE weightshift in standing and with STS x 6 with PT mod A for weightshift. Seated scap retract x 20. Wheelchair mobility forward and backward with resistance x 205 ft for further L UE/LE strengthening with bouts of just L UE forward/backward and then just L LE forward/backward. Pt fatigued post tx but without pain.    Returned to his room and ended session seated in w/c with safety belt alarm on and call bell and phone within reach. No pain. All needs met.     Therapy Documentation Precautions:  Precautions Precautions: Fall Precaution Comments: right sided hemiparesis, expressive deficits Restrictions Weight Bearing Restrictions: No       Therapy/Group: Individual Therapy  Luna Fuse 08/13/2022, 12:20 PM

## 2022-08-13 NOTE — Progress Notes (Signed)
PROGRESS NOTE   Subjective/Complaints:  Pt without complaints again today. Slept well, denies pain, LBM this morning, urinating well, no other complaints or concerns.   ROS-  per HPI, denies CP, SOB, abd pain/n/v/d/c  Objective:   No results found. No results for input(s): "WBC", "HGB", "HCT", "PLT" in the last 72 hours.   No results for input(s): "NA", "K", "CL", "CO2", "GLUCOSE", "BUN", "CREATININE", "CALCIUM" in the last 72 hours.   Intake/Output Summary (Last 24 hours) at 08/13/2022 1059 Last data filed at 08/13/2022 0900 Gross per 24 hour  Intake 535 ml  Output 925 ml  Net -390 ml        Physical Exam: Vital Signs Blood pressure 101/74, pulse 83, temperature 97.9 F (36.6 C), resp. rate 18, height 5\' 9"  (1.753 m), weight 107.7 kg, SpO2 98 %.   General: No acute distress, sitting in w/c watching TV Mood and affect are appropriate Heart: Regular rate and rhythm no rubs murmurs or extra sounds Lungs: Clear to auscultation, breathing unlabored, no rales or wheezes Abdomen: Positive bowel sounds, soft nontender to palpation, nondistended Extremities: No clubbing, cyanosis, or edema Skin: No evidence of breakdown, no evidence of rash, calluses on knees   PRIOR EXAMS: Neurologic:follows simple verbal commands , motor strength is 5/5 in Left and 0/5 right deltoid, bicep,trace  tricep,2- grip, hip flexor, knee extensors, ankle dorsiflexor and plantar flexor Sensory exam normal sensation to light touch and proprioception in bilateral upper and lower extremities Cerebellar exam unable to do on right due to weakness  Musculoskeletal: Full range of motion in all 4 extremities. No joint swelling  Speech able to name 3/3 objects , also appropriate short phrases   Assessment/Plan: 1. Functional deficits which require 3+ hours per day of interdisciplinary therapy in a comprehensive inpatient rehab setting. Physiatrist is  providing close team supervision and 24 hour management of active medical problems listed below. Physiatrist and rehab team continue to assess barriers to discharge/monitor patient progress toward functional and medical goals  Care Tool:  Bathing    Body parts bathed by patient: Abdomen, Chest   Body parts bathed by helper: Right arm, Left arm, Front perineal area, Buttocks, Right upper leg, Left upper leg, Right lower leg, Left lower leg     Bathing assist Assist Level: Total Assistance - Patient < 25%     Upper Body Dressing/Undressing Upper body dressing   What is the patient wearing?: Pull over shirt    Upper body assist Assist Level: Maximal Assistance - Patient 25 - 49%    Lower Body Dressing/Undressing Lower body dressing      What is the patient wearing?: Pants, Incontinence brief     Lower body assist Assist for lower body dressing: Maximal Assistance - Patient 25 - 49%     Toileting Toileting    Toileting assist Assist for toileting: 2 Helpers     Transfers Chair/bed transfer  Transfers assist  Chair/bed transfer activity did not occur: N/A  Chair/bed transfer assist level: Moderate Assistance - Patient 50 - 74%     Locomotion Ambulation   Ambulation assist      Assist level: 2 helpers Assistive device: Other (comment) (3  Musketeer) Max distance: 194ft   Walk 10 feet activity   Assist  Walk 10 feet activity did not occur: Safety/medical concerns  Assist level: 2 helpers Assistive device: Other (comment) (3 Musketeer)   Walk 50 feet activity   Assist Walk 50 feet with 2 turns activity did not occur: Safety/medical concerns         Walk 150 feet activity   Assist Walk 150 feet activity did not occur: Safety/medical concerns         Walk 10 feet on uneven surface  activity   Assist Walk 10 feet on uneven surfaces activity did not occur: Safety/medical concerns         Wheelchair     Assist Is the patient using  a wheelchair?: Yes Type of Wheelchair: Manual (TIS w/c)    Wheelchair assist level: Dependent - Patient 0%      Wheelchair 50 feet with 2 turns activity    Assist        Assist Level: Dependent - Patient 0%   Wheelchair 150 feet activity     Assist      Assist Level: Dependent - Patient 0%   Blood pressure 101/74, pulse 83, temperature 97.9 F (36.6 C), resp. rate 18, height 5\' 9"  (1.753 m), weight 107.7 kg, SpO2 98 %.  Medical Problem List and Plan: 1. 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  Antithrombotics: -DVT/anticoagulation:  Pharmaceutical: Heparin 5000U q8h             -antiplatelet therapy: Aspirin 81 mg daily and Brilinta 90 mg twice daily 3. Pain Management: Tylenol as needed 4. Mood/Behavior/Sleep: Provide emotional support             -antipsychotic agents: N/A 5. Neuropsych/cognition: This patient is not capable of making decisions on his own behalf. 6. Skin/Wound Care: Routine skin checks 7. Fluids/Electrolytes/Nutrition: Routine in and outs with follow-up chemistries -08/05/22 CBGs are being checked TID AC/HS, unclear why, no hx of DM2, and Hgb A1C 5.6% on 07/25/22; will decrease CBG checks to AM+QHS, if remaining normal, would consider d/c SSI and CBG checks per primary team -08/12/22 CBGs very well controlled except one time yesterday; received 5U of insulin; continue to monitor -08/13/22 2U yesterday and 2U this morning needed, but CBGs looking great overall-- monitor CBG (last 3)  Recent Labs    08/12/22 1603 08/12/22 2103 08/13/22 0623  GLUCAP 121* 83 122*    8.  Seizure prophylaxis.  EEG negative.  Continue Keppra 500 mg daily 9.  AKI on CKD stage IV.  Follow-up chemistries.  Follow-up outpatient nephrology services              -pt given lokelma prior to rehab admit             -[facility] Nephro has signed off , recheck 5/20 improved    Latest Ref Rng & Units 08/07/2022    7:30 AM 08/06/2022    6:07 AM 08/05/2022    6:07 AM  BMP  Glucose 70 - 99 mg/dL 657  846  99   BUN 6 - 20 mg/dL 90  90  86   Creatinine  0.61 - 1.24 mg/dL 1.61  0.96  0.45   Sodium 135 - 145 mmol/L 136  137  139   Potassium 3.5 - 5.1 mmol/L 4.2  5.3  5.1   Chloride 98 - 111 mmol/L 106  107  106   CO2 22 - 32 mmol/L 17  18  20    Calcium 8.9 - 10.3 mg/dL 9.3  9.3  9.5      10.  Hyperlipidemia.  Crestor 20mg  QD 11.  Permissive hypertension.  Patient on amlodipine 10 mg daily, hydralazine 50 mg twice daily, labetalol 200 mg twice daily prior to admission.  Resume as needed  -5/17-19 controlled with Chlorthalidone 25mg  QD, monitor Vitals:   08/09/22 1329 08/09/22 1954 08/10/22 0459 08/10/22 1317  BP: (!) 142/89 (!) 130/96 126/87 123/87   08/10/22 2021 08/11/22 0433 08/11/22 1642 08/11/22 2022  BP: 113/68 132/87 (!) 128/102 (!) 128/93   08/12/22 0438 08/12/22 1355 08/12/22 1956 08/13/22 0519  BP: 117/83 (!) 130/90 115/75 101/74    12.  Medical noncompliance.  Provide counseling 13. Constipation/incontinence of bowel: -08/05/22 no BM in 2 days per pt, documentation states LBM yesterday; will monitor for now, cont Colace 100mg  BID and miralax 17g QD, if no BM tomorrow then may need to adjust -Loose BM x 2 yesterday  -Incontinent with urgency, pt states he has some sensation of need , may need to develop means of communicating need for BM  -08/13/22 LBM today, monitor  LOS: 12 days A FACE TO FACE EVALUATION WAS PERFORMED  [address] 08/13/2022, 10:59 AM

## 2022-08-14 DIAGNOSIS — N179 Acute kidney failure, unspecified: Secondary | ICD-10-CM

## 2022-08-14 LAB — CBC
HCT: 39 % (ref 39.0–52.0)
Hemoglobin: 12.8 g/dL — ABNORMAL LOW (ref 13.0–17.0)
MCH: 30.8 pg (ref 26.0–34.0)
MCHC: 32.8 g/dL (ref 30.0–36.0)
MCV: 94 fL (ref 80.0–100.0)
Platelets: 295 10*3/uL (ref 150–400)
RBC: 4.15 MIL/uL — ABNORMAL LOW (ref 4.22–5.81)
RDW: 12.6 % (ref 11.5–15.5)
WBC: 7.1 10*3/uL (ref 4.0–10.5)
nRBC: 0 % (ref 0.0–0.2)

## 2022-08-14 LAB — BASIC METABOLIC PANEL
Anion gap: 15 (ref 5–15)
BUN: 97 mg/dL — ABNORMAL HIGH (ref 6–20)
CO2: 20 mmol/L — ABNORMAL LOW (ref 22–32)
Calcium: 9.6 mg/dL (ref 8.9–10.3)
Chloride: 101 mmol/L (ref 98–111)
Creatinine, Ser: 4.1 mg/dL — ABNORMAL HIGH (ref 0.61–1.24)
GFR, Estimated: 17 mL/min — ABNORMAL LOW (ref >60)
Glucose, Bld: 95 mg/dL (ref 70–99)
Potassium: 4.7 mmol/L (ref 3.5–5.1)
Sodium: 136 mmol/L (ref 135–145)

## 2022-08-14 LAB — GLUCOSE, CAPILLARY
Glucose-Capillary: 84 mg/dL (ref 70–99)
Glucose-Capillary: 99 mg/dL (ref 70–99)
Glucose-Capillary: 100 mg/dL — ABNORMAL HIGH (ref 70–99)
Glucose-Capillary: 103 mg/dL — ABNORMAL HIGH (ref 70–99)

## 2022-08-14 MED ORDER — DOCUSATE SODIUM 100 MG PO CAPS
100.0000 mg | ORAL_CAPSULE | Freq: Every day | ORAL | Status: DC
  Administered 2022-08-15 – 2022-08-22 (×8): 100 mg via ORAL
  Filled 2022-08-14 (×9): qty 1

## 2022-08-14 NOTE — Progress Notes (Signed)
Occupational Therapy Session Note  Patient Details  Name: Joseph Bright MRN: [id number] Date of Birth: [birth year]  Today's Date: 08/14/2022 OT Individual Time: 4098-1191 OT Individual Time Calculation (min): 56 min  OT Individual Time: 1400-1429 OT Individual Time Calculation (min): 29 min   Short Term Goals: Week 2:  OT Short Term Goal 1 (Week 2): Pt will recall hemidressing technique with min questioning cus consistently OT Short Term Goal 2 (Week 2): Pt will complete squat pivot transfers in both directions with min A OT Short Term Goal 3 (Week 2): Pt will maintain standing balance with min A consistently for BADLs LRAD PRN OT Short Term Goal 4 (Week 2): Pt will don pants mod A consistently  Skilled Therapeutic Interventions/Progress Updates:     AM Session: Pt received semi-reclined in bed presenting to be in good spirits receptive to skilled OT session reporting 0/10 pain- OT offering intermittent rest breaks, repositioning, and therapeutic support to optimize participation in therapy session.   Pt urgently requesting to use restroom at beginning of session. Transitioned to EOB with light mod A to bring RLE to EOB and lift trunk using bed features. Utilized urinal d/t urgency. Worked on standing balance during toileting transitioned from sit>stand with heavy min A with LUE supported on bed rail for balance. Maintained standing balance with light min A with OT guarding RLE with mod tactile cues provided for weight shifting and RLE engagement. Increased R quad engagement noted this session.   Completed dressing tasks seated EOB with min VB cues required to recall hemi-dressing technique. Donned shirt with min A to maintain RUE placement in shirt. Weaved feet into pants with CGA for dynamic sitting balance and min VB cues for technique and problem solving. Stood with min A to bring pants to waist and min A to maintain standing balance. Pt able to bring pants over L hip and OT assisting with  R hip. Squat pivot EOB>wc to L min A with min cueing for technique.  Positioned Pt at sink in wc for grooming/hygiene tasks. Pt able to complete oral car with supervisions min cueing for modified technique. Pt attempting to not use RUE during task requiring mod VB and tactile cues to initiate using RUE to stabilize items.   Transported Pt total A to therapy gym for time management and energy conservation.   Engaged Pt in BUE shoulder flexion activity with Pt holding a large beach ball in BUE to improve reciprocal movement patterns for reaching during BADLs/IADLs. Pt able to actively lift RUE to 90* with light mod A provided to fully bring RUE through full ROM. Pt completed 10x3 trials with improved ROM and activation noted during last trail. Facilitated active adduction with VB cues to apply pressure to either side of beach ball during task.   Engaged Pt in functional targeted reaching activity with tactile cues provided to support functional movement patterns and prevent compensatory shoulder elevation on R side. Pt instructed to remove squigz form vertical mirror while seated EOB for dynamic sitting balance challenge. Pt able to complete with CGA and light min A under RUE as task progressed d/t fatigue.   Squat pivot EOM>WC min A. Pt transported back to room total A in wc.   Pt was left resting in wc with call bell in reach, eat belt alarm on, and all needs met.   PM Session:  Pt received sitting up in wc with family present in room presenting to be in good spirits receptive to skilled OT session reporting  0/10 pain- OT offering intermittent rest breaks, repositioning, and therapeutic support to optimize participation in therapy session. Pt transported total A to therapy gym in wc for time management and energy conservation. Focus this session LU/LE NMR for increased functional use during BADL/IADLs. Engaged Pt in series of dual tasking activities while standing at elevated table for increased  standing balance challenge. Pt completed ~6 sit>stands during activities with session with min A with mod VB/tactile cues provided for technique and weight shifting. OT provided tactile cues for anterior translation of femur and quad engagement with min A required for balance and weight shifting with LUE supported on table. While in standing, engaged Pt in functional reaching activity with Pt instructed to reach across his body to retrieve yellow (light resistance) clothes pins from vertical bar using his RUE. Facilitated maintained pinch while transporting clothes pins to bin located on Pt R side. Pt initially requiring increased time and maximal effort to retrieve clothes pins with mod dropping, however with continued practice, Pt with improved body awareness, coordination, and muscle activation completing task with decreased time and min dropping. Pt transported back to room total A in wc. Pt was left resting in wc with call bell in reach, seat belt alarm on, and all needs met with family present in room.    Therapy Documentation Precautions:  Precautions Precautions: Fall Precaution Comments: right sided hemiparesis, expressive deficits Restrictions Weight Bearing Restrictions: No  Therapy/Group: Individual Therapy  Army Fossa 08/14/2022, 7:53 AM

## 2022-08-14 NOTE — Progress Notes (Addendum)
PROGRESS NOTE   Subjective/Complaints:  Pt up in dayroom with therapy. No new issues this morning. Sleeping well.   ROS: Patient denies fever, rash, sore throat, blurred vision, dizziness, nausea, vomiting, diarrhea, cough, shortness of breath or chest pain, joint or back/neck pain, headache, or mood change.      Objective:   No results found. Recent Labs    08/14/22 0538  WBC 7.1  HGB 12.8*  HCT 39.0  PLT 295     Recent Labs    08/14/22 0538  NA 136  K 4.7  CL 101  CO2 20*  GLUCOSE 95  BUN 97*  CREATININE 4.10*  CALCIUM 9.6     Intake/Output Summary (Last 24 hours) at 08/14/2022 0925 Last data filed at 08/14/2022 0804 Gross per 24 hour  Intake 476 ml  Output --  Net 476 ml        Physical Exam: Vital Signs Blood pressure 121/73, pulse 60, temperature 97.9 F (36.6 C), resp. rate 16, height 5\' 9"  (1.753 m), weight 107.7 kg, SpO2 100 %.   Constitutional: No distress . Vital signs reviewed. HEENT: NCAT, EOMI, oral membranes moist Neck: supple Cardiovascular: RRR without murmur. No JVD    Respiratory/Chest: CTA Bilaterally without wheezes or rales. Normal effort    GI/Abdomen: BS +, non-tender, non-distended Ext: no clubbing, cyanosis, or edema Psych: pleasant and cooperative  Skin: No evidence of breakdown, no evidence of rash, calluses on knees  Neurologic:Alert and oriented x 3. Normal insight and awareness. Intact Memory. Normal language and speech. Cranial nerve exam unremarkable. MMT: RUE 2 to 2+- prox to distal. RLE trace HF, KE, KF and 0/5 distally. Sensory exam normal sensation to light touch and proprioception in bilateral upper and lower extremities Cerebellar exam unable to do on right due to weakness  Musculoskeletal: Full range of motion in all 4 extremities. No joint swelling     Assessment/Plan: 1. Functional deficits which require 3+ hours per day of interdisciplinary therapy in  a comprehensive inpatient rehab setting. Physiatrist is providing close team supervision and 24 hour management of active medical problems listed below. Physiatrist and rehab team continue to assess barriers to discharge/monitor patient progress toward functional and medical goals  Care Tool:  Bathing    Body parts bathed by patient: Abdomen, Chest   Body parts bathed by helper: Right arm, Left arm, Front perineal area, Buttocks, Right upper leg, Left upper leg, Right lower leg, Left lower leg     Bathing assist Assist Level: Total Assistance - Patient < 25%     Upper Body Dressing/Undressing Upper body dressing   What is the patient wearing?: Pull over shirt    Upper body assist Assist Level: Maximal Assistance - Patient 25 - 49%    Lower Body Dressing/Undressing Lower body dressing      What is the patient wearing?: Pants, Incontinence brief     Lower body assist Assist for lower body dressing: Maximal Assistance - Patient 25 - 49%     Toileting Toileting    Toileting assist Assist for toileting: 2 Helpers     Transfers Chair/bed transfer  Transfers assist  Chair/bed transfer activity did not occur: N/A  Chair/bed transfer assist level: Moderate Assistance - Patient 50 - 74%     Locomotion Ambulation   Ambulation assist      Assist level: 2 helpers Assistive device: Other (comment) (3 Musketeer) Max distance: 165ft   Walk 10 feet activity   Assist  Walk 10 feet activity did not occur: Safety/medical concerns  Assist level: 2 helpers Assistive device: Other (comment) (3 Musketeer)   Walk 50 feet activity   Assist Walk 50 feet with 2 turns activity did not occur: Safety/medical concerns         Walk 150 feet activity   Assist Walk 150 feet activity did not occur: Safety/medical concerns         Walk 10 feet on uneven surface  activity   Assist Walk 10 feet on uneven surfaces activity did not occur: Safety/medical concerns          Wheelchair     Assist Is the patient using a wheelchair?: Yes Type of Wheelchair: Manual (TIS w/c)    Wheelchair assist level: Dependent - Patient 0%      Wheelchair 50 feet with 2 turns activity    Assist        Assist Level: Dependent - Patient 0%   Wheelchair 150 feet activity     Assist      Assist Level: Dependent - Patient 0%   Blood pressure 121/73, pulse 60, temperature 97.9 F (36.6 C), resp. rate 16, height 5\' 9"  (1.753 m), weight 107.7 kg, SpO2 100 %.  Medical Problem List and Plan: 1. 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  Antithrombotics: -DVT/anticoagulation:  Pharmaceutical: Heparin 5000U q8h             -antiplatelet therapy: Aspirin 81 mg daily and Brilinta 90 mg twice daily 3. Pain Management: Tylenol as needed 4. Mood/Behavior/Sleep: Provide emotional support             -antipsychotic agents: N/A 5. Neuropsych/cognition: This patient is not capable of making decisions on his own behalf. 6. Skin/Wound Care: Routine skin checks 7. Fluids/Electrolytes/Nutrition: Routine in and outs with follow-up chemistries -08/05/22 CBGs are being checked TID AC/HS, unclear why, no hx of DM2, and Hgb A1C 5.6% on 07/25/22; will decrease CBG checks to AM+QHS, if remaining normal, would consider d/c SSI and CBG checks per primary team -08/14/22 cbg's tightly controlled. Avoid overcorrection CBG (last 3)  Recent Labs    08/13/22 1719 08/13/22 2103 08/14/22 0613  GLUCAP 89 91 84    8.  Seizure prophylaxis.  EEG negative.  Continue Keppra 500 mg daily 9.  AKI on CKD stage IV.  Follow-up chemistries.  Follow-up outpatient nephrology services             -pt given lokelma  prior to rehab admit             -[facility] has signed off    5/20 potassium ok but Cr steadily climbing. Consider reconsulting nephrology  -recheck bmet again tomorrow  -will hold chlorthalidone for now  -check weight, I/O's fairly balanced    Latest Ref Rng &  Units 08/14/2022    5:38 AM 08/07/2022    7:30 AM 08/06/2022    6:07 AM  BMP  Glucose 70 - 99 mg/dL 95  161  096   BUN 6 - 20 mg/dL 97  90  90   Creatinine 0.61 - 1.24 mg/dL 0.45  4.09  8.11   Sodium 135 - 145 mmol/L 136  136  137   Potassium 3.5 - 5.1 mmol/L 4.7  4.2  5.3   Chloride 98 - 111 mmol/L 101  106  107   CO2 22 - 32 mmol/L 20  17  18    Calcium 8.9 - 10.3 mg/dL 9.6  9.3  9.3      10.  Hyperlipidemia.  Crestor 20mg  QD 11.  Permissive hypertension.  Patient on amlodipine 10 mg daily, hydralazine 50 mg twice daily, labetalol 200 mg twice daily prior to admission.  Resume as needed  -5/20--controlled with Chlorthalidone 25mg  QD     -hold for now---see #9 Vitals:   08/10/22 1317 08/10/22 2021 08/11/22 0433 08/11/22 1642  BP: 123/87 113/68 132/87 (!) 128/102   08/11/22 2022 08/12/22 0438 08/12/22 1355 08/12/22 1956  BP: (!) 128/93 117/83 (!) 130/90 115/75   08/13/22 0519 08/13/22 1453 08/13/22 2005 08/14/22 0501  BP: 101/74 111/87 134/81 121/73    12.  Medical noncompliance.  Provide counseling 13. Constipation/incontinence of bowel: -08/05/22 no BM in 2 days per pt, documentation states LBM yesterday; will monitor for now, cont Colace 100mg  BID and miralax 17g QD, if no BM tomorrow then may need to adjust -Loose BM x 2 yesterday  -Incontinent with urgency, pt states he has some sensation of need , may need to develop means of communicating need for BM  -5/20--had continent formed bm 5/19  LOS: 13 days A FACE TO FACE EVALUATION WAS PERFORMED  Ranelle Oyster 08/14/2022, 9:25 AM

## 2022-08-14 NOTE — Progress Notes (Signed)
Speech Language Pathology Daily Session Note  Patient Details  Name: Joseph Bright MRN: [id number] Date of Birth: 05-20-1975  Today's Date: 08/14/2022 SLP Individual Time: 1000-1100 SLP Individual Time Calculation (min): 60 min  Short Term Goals: Week 2: SLP Short Term Goal 1 (Week 2): Pt will utilize compensatory word-finding strategies to repair communication breakdowns with 75% accuracy given sup cues. SLP Short Term Goal 2 (Week 2): Pt will complete picture descriptions with 80% acc given min (syllabic and carrier phrase) cues. SLP Short Term Goal 3 (Week 2): Pt will answer complex and abstract yes/ no questions with 90% acc in conjunction with min cues.  Skilled Therapeutic Interventions:  Pt was seen in am to address expressive language. Pt was alert and seated upright in WC upon SLP arrival. SLP challenged pt in functional communication with pt indep verbalizing his name, DOB, age, children's names, and address. SLP challenged pt in verbalizing more complex functional information including what he does for work. Pt requiring mod to max cues in past session with pt expressing job function with min A. SLP challenging pt in answering more complex why questions which pt completed with min A for 75% acc. SLP challenged pt in divergent naming task at conclusion of session with pt completing task with min to mod A. SLP instructed pt in activities to complete outside of therapy to maximize language. Direct handoff to PT at conclusion of session. SLP to continue POC.   Pain Pain Assessment Pain Scale: 0-10 Pain Score: 0-No pain  Therapy/Group: Individual Therapy  Renaee Munda 08/14/2022, 4:19 PM

## 2022-08-14 NOTE — Progress Notes (Signed)
Physical Therapy Session Note  Patient Details  Name: Joseph Bright MRN: [id number] Date of Birth: 1975/04/09  Today's Date: 08/14/2022 PT Individual Time: 1100-1155 PT Individual Time Calculation (min): 55 min   Short Term Goals: Week 1:  PT Short Term Goal 1 (Week 1): Pt will be able to perform mod assist transfers to the R PT Short Term Goal 1 - Progress (Week 1): Met PT Short Term Goal 2 (Week 1): Pt will be able to perform w/c propulsion with min assist x 50' PT Short Term Goal 2 - Progress (Week 1): Progressing toward goal PT Short Term Goal 3 (Week 1): Pt will be able to initiate gait training PT Short Term Goal 3 - Progress (Week 1): Met PT Short Term Goal 4 (Week 1): Pt will be able to perform bed mobility with min assist PT Short Term Goal 4 - Progress (Week 1): Met Week 2:  PT Short Term Goal 1 (Week 2): Pt will perform supine<>sit with CGA PT Short Term Goal 2 (Week 2): Pt will perform sit<>stands using LRAD with min assist PT Short Term Goal 3 (Week 2): Pt will perform bed<>chair transfers using LRAD with min assist PT Short Term Goal 4 (Week 2): Pt will ambulate at least 71ft using LRAD with mod assist of 1 and +2 assist as needed PT Short Term Goal 5 (Week 2): Pt will navigate 4 stairs using B HRs with mod assist of 1 and +2 for safety as needed  Skilled Therapeutic Interventions/Progress Updates:   Received pt sitting in [facility] - handoff from SLP. Pt agreeable to PT treatment and denied any pain during session. Session with emphasis on functional mobility/transfers, generalized strengthening and endurance, dynamic standing balance/coordination, and NMR. BP sitting in WC: 129/99 - pt asymptomatic. Pt transported to/from room in [facility] dependently for time management purposes. Pt transferred to/from mat via squat<>pivot to L with min/light mod A. Stood from [facility] with min A and worked on dynamic standing balance, weight shifting, and reaching outside BOS clipping/unclipping clothespins  to/from basketball net with min/mod A for balance while guarding/blocking at R knee - pt with 1 R lateral/posterior LOB onto mat. Attempted to incorporate RUE into task, however pt fatiguing more quickly and losing focus on RLE, therefore continued activity using LUE. Worked on standing mini squats 3x10 holding unweighted ball with both hands with mod A overall for balance - emphasis on R quad control and knee extension, lateral weight shifting, midline orientation, and eccentric control when sitting. Pt required multiple rest breaks due to fatigue but denied any increase in pain. BP: 127/92 - pt asymptomatic. Assessed pt's quad and hamstring activation while attempting seated active assisted knee flexion/extension, felt very minimal contraction with palpation. Returned to room and concluded session with pt sitting in WC, needs within reach, and seatbelt alarm on. NT present checking blood glucose levels.   Therapy Documentation Precautions:  Precautions Precautions: Fall Precaution Comments: right sided hemiparesis, expressive deficits Restrictions Weight Bearing Restrictions: No  Therapy/Group: Individual Therapy Marlana Salvage Zaunegger Blima Rich PT, DPT 08/14/2022, 7:10 AM

## 2022-08-14 NOTE — Progress Notes (Signed)
Patient says he will place himself on CPAP when he is ready.

## 2022-08-14 NOTE — Consult Note (Signed)
[facility] Renal Consultation Note  Requesting MD: Dr. Wynn Banker Indication for Consultation: AKI  HPI:  Joseph Bright is a 47 y.o. 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 Since medical stabilization on 5/8 patient has been undergoing rehabilitation in CIR here at [facility]. Patient did have some loose bowel movements on 5/12 and has been intermittently incontinent of bowels over the last week. Per chart review did have two large BM yesterday but none before. In discussing with Mr. Therriault, he reports he has been eating and drinking normally, no issues with urination, leg swelling, cough, dyspnea.   PMHx:   Past Medical History:  Diagnosis Date   Hypertension    Stroke [facility])     Past Surgical History:  Procedure Laterality Date   IR CT HEAD LTD  08/07/2022   IR INTRA CRAN STENT  08/07/2022   IR PERCUTANEOUS ART THROMBECTOMY/INFUSION INTRACRANIAL INC DIAG ANGIO  07/25/2022   LOOP RECORDER INSERTION N/A 07/28/2022   Procedure: LOOP RECORDER INSERTION;  Surgeon: Regan Lemming, MD;  Location: [facility] INVASIVE CV LAB;  Service: Cardiovascular;  Laterality: N/A;   RADIOLOGY WITH ANESTHESIA N/A 07/25/2022   Procedure: RADIOLOGY WITH ANESTHESIA;  Surgeon: Radiologist, Medication, MD;  Location: [facility] OR;  Service: Radiology;  Laterality: N/A;   TEE WITHOUT CARDIOVERSION N/A 07/28/2022   Procedure: TRANSESOPHAGEAL ECHOCARDIOGRAM;  Surgeon: Parke Poisson, MD;  Location: [facility] INVASIVE CV LAB;  Service: Cardiovascular;  Laterality: N/A;    Family Hx: History reviewed. No pertinent family history.  Social History:  reports that he quit smoking about 22 years ago. His smoking use included cigarettes. He started smoking about 27 years ago. He has never used smokeless tobacco. He reports current alcohol use. He reports that he does not use drugs.  Allergies: No Known Allergies  Medications: Prior to Admission  medications   Medication Sig Start Date End Date Taking? Authorizing Provider  amLODipine (NORVASC) 10 MG tablet Take 10 mg by mouth daily.    [provider]  hydrALAZINE (APRESOLINE) 50 MG tablet Take 50 mg by mouth 2 (two) times daily.    [provider]  isosorbide mononitrate (IMDUR) 60 MG 24 hr tablet Take 60 mg by mouth daily.    [provider]  labetalol (NORMODYNE) 200 MG tablet Take 200 mg by mouth 2 (two) times daily.    [provider]  simvastatin (ZOCOR) 20 MG tablet Take 20 mg by mouth daily.    [provider]  ticagrelor (BRILINTA) 90 MG TABS tablet Take 1 tablet (90 mg total) by mouth 2 (two) times daily. 07/31/22   Kennieth Francois, PA  traMADol (ULTRAM) 50 MG tablet TAKE 1 TO 2 TABLETS(50 TO 100 MG) BY MOUTH AT BEDTIME AS NEEDED Patient not taking: Reported on 07/25/2022 06/25/18   Hilts, Casimiro Needle, MD    I have reviewed the patient's current medications.  Labs:  Results for orders placed or performed during the hospital encounter of 08/01/22 (from the past 48 hour(s))  Glucose, capillary     Status: None   Collection Time: 08/12/22 11:31 AM  Result Value Ref Range   Glucose-Capillary 95 70 - 99 mg/dL    Comment: Glucose reference range applies only to samples taken after fasting for at least 8 hours.   Comment 1 Notify RN   Glucose, capillary     Status: Abnormal   Collection Time: 08/12/22  4:03 PM  Result Value  Ref Range   Glucose-Capillary 121 (H) 70 - 99 mg/dL    Comment: Glucose reference range applies only to samples taken after fasting for at least 8 hours.  Glucose, capillary     Status: None   Collection Time: 08/12/22  9:03 PM  Result Value Ref Range   Glucose-Capillary 83 70 - 99 mg/dL    Comment: Glucose reference range applies only to samples taken after fasting for at least 8 hours.  Glucose, capillary     Status: Abnormal   Collection Time: 08/13/22  6:23 AM  Result Value Ref Range   Glucose-Capillary 122  (H) 70 - 99 mg/dL    Comment: Glucose reference range applies only to samples taken after fasting for at least 8 hours.  Glucose, capillary     Status: Abnormal   Collection Time: 08/13/22 12:15 PM  Result Value Ref Range   Glucose-Capillary 114 (H) 70 - 99 mg/dL    Comment: Glucose reference range applies only to samples taken after fasting for at least 8 hours.  Glucose, capillary     Status: None   Collection Time: 08/13/22  5:19 PM  Result Value Ref Range   Glucose-Capillary 89 70 - 99 mg/dL    Comment: Glucose reference range applies only to samples taken after fasting for at least 8 hours.  Glucose, capillary     Status: None   Collection Time: 08/13/22  9:03 PM  Result Value Ref Range   Glucose-Capillary 91 70 - 99 mg/dL    Comment: Glucose reference range applies only to samples taken after fasting for at least 8 hours.  CBC     Status: Abnormal   Collection Time: 08/14/22  5:38 AM  Result Value Ref Range   WBC 7.1 4.0 - 10.5 K/uL   RBC 4.15 (L) 4.22 - 5.81 MIL/uL   Hemoglobin 12.8 (L) 13.0 - 17.0 g/dL   HCT 16.1 09.6 - 04.5 %   MCV 94.0 80.0 - 100.0 fL   MCH 30.8 26.0 - 34.0 pg   MCHC 32.8 30.0 - 36.0 g/dL   RDW 40.9 81.1 - 91.4 %   Platelets 295 150 - 400 K/uL   nRBC 0.0 0.0 - 0.2 %    Comment: Performed at [facility], [address], [place], [place] [address]  Basic metabolic panel     Status: Abnormal   Collection Time: 08/14/22  5:38 AM  Result Value Ref Range   Sodium 136 135 - 145 mmol/L   Potassium 4.7 3.5 - 5.1 mmol/L   Chloride 101 98 - 111 mmol/L   CO2 20 (L) 22 - 32 mmol/L   Glucose, Bld 95 70 - 99 mg/dL    Comment: Glucose reference range applies only to samples taken after fasting for at least 8 hours.   BUN 97 (H) 6 - 20 mg/dL   Creatinine, Ser 6.21 (H) 0.61 - 1.24 mg/dL   Calcium 9.6 8.9 - 30.8 mg/dL   GFR, Estimated 17 (L) >60 mL/min    Comment: (NOTE) Calculated using the CKD-EPI Creatinine Equation (2021)    Anion gap 15 5 - 15     Comment: Performed at [facility], [address], [place], [place] [address]  Glucose, capillary     Status: None   Collection Time: 08/14/22  6:13 AM  Result Value Ref Range   Glucose-Capillary 84 70 - 99 mg/dL    Comment: Glucose reference range applies only to samples taken after fasting for  at least 8 hours.     ROS:  Patient denies dyspnea, cough, nausea, vomiting, abdominal pain, polyuria, diarrhea.  Physical Exam: Vitals:   08/13/22 2125 08/14/22 0501  BP:  121/73  Pulse: 78 60  Resp: 18 16  Temp:  97.9 F (36.6 C)  SpO2: 98% 100%     General: Resting in wheelchair in no acute distress HEENT: Normocephalic, atraumatic. Moist mucous membranes Eyes: Vision grossly in tact. No scleral icterus or conjunctival injection Neck: Supple, no JVD Heart: Regular rate, rhythm. No murmurs. Warm extremities.  Lungs: Normal work of breathing on room air. Clear to auscultation bilaterally.  Abdomen: Soft, non-tender, non-distended. Extremities: No peripheral edema noted Skin: No rashes or lesions. Neuro: Awake, alert, conversing appropriately.   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 Baseline appears to be closer to 3.0-3.2. There was a minor increase in creatinine 5/10 when anti-hypertensives switched to chlorthalidone. 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 He is urinating well, suspect this AKI is pre-renal, possibly due to multiple large bowel movements. Will back off colace today, hold chlorthalidone (he did get this earlier today). He is urinating well and is not having abdominal pain, suspicion for obstruction or infection is low. Given he is eating and drinking well, I think we can hold off IVF for now. Will continue with salt tablets for chronic metabolic acidosis related to chronic renal disease.  Bowel incontinence: Decrease Colace to once daily, continue with Miralax.  Functional deficits  2/2 recent CVA: Per primary, continue with anti-platelet therapy, physical therapy.    Evlyn Kanner 08/14/2022, 10:58 AM

## 2022-08-15 LAB — BASIC METABOLIC PANEL
Anion gap: 12 (ref 5–15)
BUN: 104 mg/dL — ABNORMAL HIGH (ref 6–20)
CO2: 23 mmol/L (ref 22–32)
Calcium: 9.6 mg/dL (ref 8.9–10.3)
Chloride: 102 mmol/L (ref 98–111)
Creatinine, Ser: 4.12 mg/dL — ABNORMAL HIGH (ref 0.61–1.24)
GFR, Estimated: 17 mL/min — ABNORMAL LOW (ref >60)
Glucose, Bld: 101 mg/dL — ABNORMAL HIGH (ref 70–99)
Potassium: 5.1 mmol/L (ref 3.5–5.1)
Sodium: 137 mmol/L (ref 135–145)

## 2022-08-15 LAB — GLUCOSE, CAPILLARY
Glucose-Capillary: 93 mg/dL (ref 70–99)
Glucose-Capillary: 93 mg/dL (ref 70–99)
Glucose-Capillary: 108 mg/dL — ABNORMAL HIGH (ref 70–99)
Glucose-Capillary: 127 mg/dL — ABNORMAL HIGH (ref 70–99)

## 2022-08-15 NOTE — Progress Notes (Signed)
Speech Language Pathology Daily Session Note  Patient Details  Name: Joseph Bright MRN: [id number] Date of Birth: February 22, 1976  Today's Date: 08/15/2022 SLP Individual Time:  -     Short Term Goals: Week 2: SLP Short Term Goal 1 (Week 2): Pt will utilize compensatory word-finding strategies to repair communication breakdowns with 75% accuracy given sup cues. SLP Short Term Goal 2 (Week 2): Pt will complete picture descriptions with 80% acc given min (syllabic and carrier phrase) cues. SLP Short Term Goal 3 (Week 2): Pt will answer complex and abstract yes/ no questions with 90% acc in conjunction with min cues.  Skilled Therapeutic Interventions:  Pt was seen in am to address expressive aphasia. Direct handoff from PT with pt  transitioned back to his room for completion of session. SLP addressed functional communication with focus on verbalizing job functions and role play of scenario specific task. Pt warranting min to mod A for thoroughness. He continued to present with non fluent aphasia c/b hesitations and emerging skills to utilize compensatory strategies. Pt was challenged in divergent naming task where he required min A. He was subsequently challenged to identify descriptors to be utilized in case of anomia in order to facilitate use of circumlocution strategies. Pt completing task with sup to min A for thoroughness. In additional minutes of session, SLP challenged pt in verbalizing instructions to a recipe. Pt completed task with sup to min A including; syllabic and descriptive cues which were effective. Pt was left seated upright in [facility] with chair alarm active and call button within reach. SLP to continue POC.   Pain Pain Assessment Pain Scale: 0-10 Pain Score: 0-No pain  Therapy/Group: Individual Therapy  Renaee Munda 08/15/2022, 3:07 PM

## 2022-08-15 NOTE — Progress Notes (Signed)
Physical Therapy Session Note  Patient Details  Name: Joseph Bright MRN: [id number] Date of Birth: [birth year]  Today's Date: 08/15/2022 PT Individual Time: 4098-1191 and 4782-9562 PT Individual Time Calculation (min): 29 min and 68 min  Short Term Goals: Week 2:  PT Short Term Goal 1 (Week 2): Pt will perform supine<>sit with CGA PT Short Term Goal 2 (Week 2): Pt will perform sit<>stands using LRAD with min assist PT Short Term Goal 3 (Week 2): Pt will perform bed<>chair transfers using LRAD with min assist PT Short Term Goal 4 (Week 2): Pt will ambulate at least 27ft using LRAD with mod assist of 1 and +2 assist as needed PT Short Term Goal 5 (Week 2): Pt will navigate 4 stairs using B HRs with mod assist of 1 and +2 for safety as needed  Skilled Therapeutic Interventions/Progress Updates:    Session 1: Pt received supine in bed awake and eager to participate in therapy session. Supine>sitting R EOB, HOB flat but heavily using bedrails with L UE for trunk upright and using L L E to manage R LE off EOB to come to sitting with CGA. Sitting EOB, with adequate trunk control, threaded on shorts using compensatory techniques with min/mod assist. Donned shoes with R LE Matrix Max GRAFO total assist. Sit>stand EOB>no UE support with min assist for balance and blocking R knee - pt continues to have strong compensatory standing with total WBing through L LE. Pulled pants up over hips min assist for R side. L stand pivot to w/c min assist using compensatory strategies.  Transported to/from gym in w/c for time management and energy conservation.  Gait training 168ft x2 using 3 Musketeer support progressed to +2 L HHA with +2 mod assist for balance and R LE management (therapist sitting on stool for improved facilitation of R LE). Pt demonstrating the following gait deviations with therapist providing the described cuing and facilitation for improvement:  - controls R stance phase with only min occasional  mod assist with pt demoing continued increase in R LE quad activation through longer period of stance phase - advances R swing ~25% of the way, donned shoe cover for "toe cap" during 2nd gait trail to allow pt improved ability to advance during swing, tends to use compensatory technique of advancing with quads rather than performing hip/knee flexion - weight shifts onto stance limb more consistently with verbal/tactile cuing - demos improving R stance time, although with fatigue requires manual facilitation, achieves consistent reciprocal stepping pattern with L LE  - cuing to avoid excessive forward progression of body over L stance phase until ensuring he has stepped R LE forward with therapist's assistance  Pt left seated in w/c as hand-off to SLP.   Session 2: Pt received sitting in w/c and eager to participate in therapy session.  Transported to/from gym in w/c for time management and energy conservation. Pt already wearing R LE Matrix Max GRAFO. Transported to/from gym in w/c for time management and energy conservation.   Sit>stands to L HHA from +2 with min assist for balance - pt continues to compensate with heavy L LE WBing bias with limited R LE hip/knee extensor activation and minimal R LE WBing.  Gait training ~10ft x3 (seated breaks between) using + 2 B HHA with +2 min/mod assist for balance and R LE management. Pt demonstrating the following gait deviations with therapist providing the described cuing and facilitation for improvement:  - donned shoe cover on R toes for improved ability for  pt to advance R LE during swing - pt able to advance R LE 25-75% of the way depending on fatigue levels with greatest improvement during 2nd gait trial resulting in increasing gait speed along with more consistent weight shifting to allow R LE advancement with less assist - continues to have good R quad activation during/throughout stance phase only requiring min guarding for safety - will likely  transition to PLS AFO trial tomorrow - pt continues to have improved R/L weight shift onto stance limb - pt did notice to try and start vaulting by going up on L toes during R swing phase but cuing to avoid developing compensatory techniques  Stair navigation training ascending/descending 4 steps (6" height) x4 reps  (seated break between) using L UE support on each HR with mod assist of 1 and + 2 guarding for safety - step-to pattern leading with L LE on ascent and R LE on descent with therapist having to provide total assist to manually facilitate lifting R LE onto next step in both directions - max cuing to activate R quads during stance phase prior to stepping L LE in both directions, with pt having improved activation on descent compared to ascent (during ascent, pt appears to focus more on L UE support on HR to pull himself up)  Transported back to his room and pt requesting to return to bed due to fatigue.  Therapist retrieved [place], LPN to discuss progression to performing squat pivot transfers w/c<>EOB only (not for toileting) with nursing staff to allow pt to progress towards more functional transfers. Therapist educated on proper set-up of wheelchair, how to manually assist with R hemibody positioning for safety, need to wear AFO to protect R ankle.  R squat pivot w/c>EOB with min assist for pivoting hips only and for promoting R LE WBing. Doffed shorts and socks/shoes with increased assist for time management. Assessed pt's R LE skin integrity with no redness/dark areas indicating any rubbing from AFO .  Sit>supine, HOB flat but using opposite bedrail for trunk control via L UE, with min assist for R LE management into bed only. Pt left supine in bed with needs in reach and bed alarm on.   Therapy Documentation Precautions:  Precautions Precautions: Fall Precaution Comments: right sided hemiparesis, expressive deficits Restrictions Weight Bearing Restrictions: No   Pain:   Session 1: No reports of pain throughout session.  Session 2: No reports of pain throughout session.    Therapy/Group: Individual Therapy  Ginny Forth , PT, DPT, NCS, CSRS 08/15/2022, 7:52 AM

## 2022-08-15 NOTE — Progress Notes (Signed)
[facility] Progress Note   47 y.o. 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 Since medical stabilization on 5/8 patient has been undergoing rehabilitation in CIR here at [facility]. Patient did have some loose bowel movements on 5/12 and has been intermittently incontinent of bowels over the last week. Per chart review did have two large BM day before consultation. He has been eating and drinking normally, no issues with urination, leg swelling, cough, dyspnea.   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 Baseline appears to be closer to 3.0-3.2. There was a minor increase in creatinine 5/10 when anti-hypertensives switched to chlorthalidone. 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 He is urinating well, suspect this AKI is pre-renal, possibly due to multiple large bowel movements. Will back off colace today, hold chlorthalidone (he did get this earlier today). He is urinating well and is not having abdominal pain, suspicion for obstruction or infection is low.  - He has a good appetite with no signs or uremia.  - He's net even basically over the past few weeks since going to CIR. - He did receive contrast CT angiohead 4/30 (75ml), lysis 4/30  with 80mL of contrast given, and then a stent on 5/13 80mL for a IC stent  He is likely in ATN from contrast nephropathy; with his already advanced CKDIV there's a possibility he is ESRD. But would like to give him the benefit of the doubt given he has a good appetite and has no dyspnea. No absolute indication for RRT; hopefully he will recover some function and we won't have to make that decision.  Continue with HCO3 tablets for chronic metabolic acidosis related to chronic renal disease.    Bowel incontinence: Decrease Colace to once daily, continue with Miralax.  Functional deficits 2/2 recent CVA: Per  primary, continue with anti-platelet therapy, physical therapy.   Subjective:   Denies f/c/n/v/sob/cp/diarrhea/obstructive sxs   Objective:   BP 114/85 (BP Location: Left Arm)   Pulse 66   Temp 98 F (36.7 C)   Resp 20   Ht 5\' 9"  (1.753 m)   Wt 99.3 kg   SpO2 100%   BMI 32.34 kg/m   Intake/Output Summary (Last 24 hours) at 08/15/2022 1218 Last data filed at 08/15/2022 1003 Gross per 24 hour  Intake 716 ml  Output 1000 ml  Net -284 ml   Weight change:   Physical Exam: General: Resting in wheelchair in no acute distress HEENT: NCAT Neck: Supple, no JVD Heart: Regular rate, rhythm.  Lungs: Clear to auscultation bilaterally, no rales.  Abdomen: Soft, non-tender, non-distended. Extremities: No peripheral edema noted Skin: No rashes or lesions. Neuro: Awake, alert, conversing appropriately.   Imaging: No results found.  Labs: BMET Recent Labs  Lab 08/14/22 0538 08/15/22 0601  NA 136 137  K 4.7 5.1  CL 101 102  CO2 20* 23  GLUCOSE 95 101*  BUN 97* 104*  CREATININE 4.10* 4.12*  CALCIUM 9.6 9.6   CBC Recent Labs  Lab 08/14/22 0538  WBC 7.1  HGB 12.8*  HCT 39.0  MCV 94.0  PLT 295    Medications:     aspirin  81 mg Oral Daily   docusate sodium  100 mg Oral Daily   feeding supplement (NEPRO CARB STEADY)  237 mL Oral BID BM   heparin  5,000 Units Subcutaneous Q8H   insulin aspart  0-15 Units Subcutaneous TID WC   levETIRAcetam  500 mg Oral Q2200   polyethylene glycol  17 g Oral Daily   rosuvastatin  20 mg Oral Daily   sodium bicarbonate  1,300 mg Oral BID   ticagrelor  90 mg Oral BID      Paulene Floor, MD 08/15/2022, 12:18 PM

## 2022-08-15 NOTE — Progress Notes (Signed)
Physical Therapy Session Note  Patient Details  Name: Joseph Bright MRN: [id number] Date of Birth: 28-Feb-1976  Today's Date: 08/15/2022 PT Individual Time: 6967-8938 PT Individual Time Calculation (min): 42 min   Short Term Goals: Week 2:  PT Short Term Goal 1 (Week 2): Pt will perform supine<>sit with CGA PT Short Term Goal 2 (Week 2): Pt will perform sit<>stands using LRAD with min assist PT Short Term Goal 3 (Week 2): Pt will perform bed<>chair transfers using LRAD with min assist PT Short Term Goal 4 (Week 2): Pt will ambulate at least 46ft using LRAD with mod assist of 1 and +2 assist as needed PT Short Term Goal 5 (Week 2): Pt will navigate 4 stairs using B HRs with mod assist of 1 and +2 for safety as needed  Skilled Therapeutic Interventions/Progress Updates:      Pt sitting in w/c to start - in agreement to therapy and no reports of pain. Has R GRAFO on throughout treatment.  Transported in w/c to main rehab hallway. Used L hand rail for gait training - ambulated 9ft with 1 person assist, modA overall. Assist primarily for managing trunk and RLE during gait cycle. Patient with decreased stance time on R, quick stepping on L. Progressed gait training to using EVA walker. Ambulated 115ft + 178ft with +2 assist, rehab tech help steer/stabilize EVA walker with PT facilitating gait - needing leg lifter to help advance RLE during swing phase. Patient very inconsistent with step lengths, step timing, and initiation. Seated rest breaks b/w gait trials but primarily for PT fatigue due to assist level.  Concluded session with kinetron at wheelchair level to work on reciprocal stepping and RLE NMR/activation. AAROM for all motions of kinetron with light resistance. X7 minutes with brief, intermittent rest breaks.  Returned to his room and concluded session seated in w/c with safety belt alarm on, call bell in reach.     Therapy Documentation Precautions:  Precautions Precautions:  Fall Precaution Comments: right sided hemiparesis, expressive deficits Restrictions Weight Bearing Restrictions: No General:     Therapy/Group: Individual Therapy  [name] 08/15/2022, 1:59 PM

## 2022-08-15 NOTE — Progress Notes (Addendum)
Patient ID: Joseph Bright, male   DOB: 1976/03/27, 47 y.o.   MRN: [id number]  SW spoke with spouse, [name]. Spouse will begin coming to view therapy sessions on Monday, 08/21/22 1-3 PM (for time purposes with childcare). Spouse will arrange additional times for education the following week as well. Sw will provide spouse with updates tomorrow. No additional questions or concerns.

## 2022-08-15 NOTE — Progress Notes (Signed)
PROGRESS NOTE   Subjective/Complaints:  Appreciate nephro re eval   ROS: Patient denies CP, SOB N/V/D.      Objective:   No results found. Recent Labs    08/14/22 0538  WBC 7.1  HGB 12.8*  HCT 39.0  PLT 295      Recent Labs    08/14/22 0538 08/15/22 0601  NA 136 137  K 4.7 5.1  CL 101 102  CO2 20* 23  GLUCOSE 95 101*  BUN 97* 104*  CREATININE 4.10* 4.12*  CALCIUM 9.6 9.6      Intake/Output Summary (Last 24 hours) at 08/15/2022 0817 Last data filed at 08/15/2022 0746 Gross per 24 hour  Intake 716 ml  Output 601 ml  Net 115 ml         Physical Exam: Vital Signs Blood pressure 114/85, pulse 66, temperature 98 F (36.7 C), resp. rate 20, height 5\' 9"  (1.753 m), weight 99.3 kg, SpO2 100 %.    General: No acute distress Mood and affect are appropriate Heart: Regular rate and rhythm no rubs murmurs or extra sounds Lungs: Clear to auscultation, breathing unlabored, no rales or wheezes Abdomen: Positive bowel sounds, soft nontender to palpation, nondistended Extremities: No clubbing, cyanosis, or edema Skin: No evidence of breakdown, no evidence of rash  Skin: No evidence of breakdown, no evidence of rash, calluses on knees  Neurologic:Alert and oriented x 3. Normal insight and awareness. Intact Memory. Normal language and speech. Cranial nerve exam unremarkable. MMT: RUE 3- prox to distal. RLE trace HF, KE, KF and 0/5 distally. Sensory exam normal sensation to light touch and proprioception in bilateral upper and lower extremities Cerebellar exam unable to do on right due to weakness  Musculoskeletal: Full range of motion in all 4 extremities. No joint swelling     Assessment/Plan: 1. Functional deficits which require 3+ hours per day of interdisciplinary therapy in a comprehensive inpatient rehab setting. Physiatrist is providing close team supervision and 24 hour management of active medical  problems listed below. Physiatrist and rehab team continue to assess barriers to discharge/monitor patient progress toward functional and medical goals  Care Tool:  Bathing    Body parts bathed by patient: Abdomen, Chest   Body parts bathed by helper: Right arm, Left arm, Front perineal area, Buttocks, Right upper leg, Left upper leg, Right lower leg, Left lower leg     Bathing assist Assist Level: Total Assistance - Patient < 25%     Upper Body Dressing/Undressing Upper body dressing   What is the patient wearing?: Pull over shirt    Upper body assist Assist Level: Maximal Assistance - Patient 25 - 49%    Lower Body Dressing/Undressing Lower body dressing      What is the patient wearing?: Pants, Incontinence brief     Lower body assist Assist for lower body dressing: Maximal Assistance - Patient 25 - 49%     Toileting Toileting    Toileting assist Assist for toileting: 2 Helpers     Transfers Chair/bed transfer  Transfers assist  Chair/bed transfer activity did not occur: N/A  Chair/bed transfer assist level: Minimal Assistance - Patient > 75%  Locomotion Ambulation   Ambulation assist      Assist level: 2 helpers Assistive device: Other (comment) (3 Musketeer) Max distance: 169ft   Walk 10 feet activity   Assist  Walk 10 feet activity did not occur: Safety/medical concerns  Assist level: 2 helpers Assistive device: Other (comment) (3 Musketeer)   Walk 50 feet activity   Assist Walk 50 feet with 2 turns activity did not occur: Safety/medical concerns         Walk 150 feet activity   Assist Walk 150 feet activity did not occur: Safety/medical concerns         Walk 10 feet on uneven surface  activity   Assist Walk 10 feet on uneven surfaces activity did not occur: Safety/medical concerns         Wheelchair     Assist Is the patient using a wheelchair?: Yes Type of Wheelchair: Manual (TIS w/c)    Wheelchair  assist level: Dependent - Patient 0%      Wheelchair 50 feet with 2 turns activity    Assist        Assist Level: Dependent - Patient 0%   Wheelchair 150 feet activity     Assist      Assist Level: Dependent - Patient 0%   Blood pressure 114/85, pulse 66, temperature 98 F (36.7 C), resp. rate 20, height 5\' 9"  (1.753 m), weight 99.3 kg, SpO2 100 %.  Medical Problem List and Plan: 1. 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  Antithrombotics: -DVT/anticoagulation:  Pharmaceutical: Heparin 5000U q8h             -antiplatelet therapy: Aspirin 81 mg daily and Brilinta 90 mg twice daily 3. Pain Management: Tylenol as needed 4. Mood/Behavior/Sleep: Provide emotional support             -antipsychotic agents: N/A 5. Neuropsych/cognition: This patient is not capable of making decisions on his own behalf. 6. Skin/Wound Care: Routine skin checks 7. Fluids/Electrolytes/Nutrition: Routine in and outs with follow-up chemistries -08/05/22 CBGs are being checked TID AC/HS, unclear why, no hx of DM2, and Hgb A1C 5.6% on 07/25/22; will decrease CBG checks to AM+QHS, if remaining normal, would consider d/c SSI and CBG checks per primary team -08/14/22 cbg's tightly controlled. Avoid overcorrection CBG (last 3)  Recent Labs    08/14/22 1643 08/14/22 2106 08/15/22 0615  GLUCAP 100* 103* 93     8.  Seizure prophylaxis.  EEG negative.  Continue Keppra 500 mg daily 9.  AKI on CKD stage IV.  Follow-up chemistries.  Follow-up outpatient nephrology services             -pt given lokelma prior to rehab admit             -[facility] has signed off    5/20 potassium ok but Cr steadily climbing. Consider  reconsulting nephrology  -recheck bmet again tomorrow  -will hold chlorthalidone for now  -check weight, I/O's fairly balanced    Latest Ref Rng & Units 08/15/2022    6:01 AM 08/14/2022    5:38  AM 08/07/2022    7:30 AM  BMP  Glucose 70 - 99 mg/dL 161  95  096   BUN 6 - 20 mg/dL 045  97  90   Creatinine 0.61 - 1.24 mg/dL 4.09  8.11  9.14   Sodium 135 - 145 mmol/L 137  136  136   Potassium 3.5 - 5.1 mmol/L 5.1  4.7  4.2   Chloride 98 - 111 mmol/L 102  101  106   CO2 22 - 32 mmol/L 23  20  17    Calcium 8.9 - 10.3 mg/dL 9.6  9.6  9.3      10.  Hyperlipidemia.  Crestor 20mg  QD 11.  Permissive hypertension.  Patient on amlodipine 10 mg daily, hydralazine 50 mg twice daily, labetalol 200 mg twice daily prior to admission.  Resume as needed  -5/20--controlled with Chlorthalidone 25mg  QD     -hold for now---see #9 Vitals:   08/11/22 0433 08/11/22 1642 08/11/22 2022 08/12/22 0438  BP: 132/87 (!) 128/102 (!) 128/93 117/83   08/12/22 1355 08/12/22 1956 08/13/22 0519 08/13/22 1453  BP: (!) 130/90 115/75 101/74 111/87   08/13/22 2005 08/14/22 0501 08/14/22 1348 08/15/22 0614  BP: 134/81 121/73 (!) 124/93 114/85    12.  Medical noncompliance.  Provide counseling 13. Constipation/incontinence of bowel: -08/05/22 no BM in 2 days per pt, documentation states LBM yesterday; will monitor for now, cont Colace 100mg  BID and miralax 17g QD, if no BM tomorrow then may need to adjust Cont BM 5/20  LOS: 14 days A FACE TO FACE EVALUATION WAS PERFORMED  Erick Colace 08/15/2022, 8:17 AM

## 2022-08-15 NOTE — Progress Notes (Signed)
Occupational Therapy Session Note  Patient Details  Name: Joseph Bright MRN: [id number] Date of Birth: November 28, 1975  Today's Date: 08/15/2022 OT Individual Time: 4098-1191 OT Individual Time Calculation (min): 57 min    Short Term Goals: Week 2:  OT Short Term Goal 1 (Week 2): Pt will recall hemidressing technique with min questioning cus consistently OT Short Term Goal 2 (Week 2): Pt will complete squat pivot transfers in both directions with min A OT Short Term Goal 3 (Week 2): Pt will maintain standing balance with min A consistently for BADLs LRAD PRN OT Short Term Goal 4 (Week 2): Pt will don pants mod A consistently  Skilled Therapeutic Interventions/Progress Updates:     Pt received sitting up in wc presenting to be in good spirits receptive to skilled OT session reporting 0/10 pain- OT offering intermittent rest breaks, repositioning, and therapeutic support to optimize participation in therapy session. Pt dressed and ready for the day upon OT arrival, Pt politely denying need for BADLs. Transported Pt total A to therapy gym in wc for time management and energy conservation. Focus this session RUE NMR to increase Pt functional use during BADL and IADL tasks. Squat pivot wc>EOM to L light min A with assistance required for positioning prior to transfer. Worked on [employer] with functional reaching and OT providing gentle resistance to improved proprioceptive feedback. Gentle stretching through shoulder flexion, supination/pronation, shoulder internal/external rotation, and wrist flexion/extension completed to increase overall ROM for improved functional use. Engaged Pt in peg board activity using medium sized pegs to facilitate targeted reaching, wrist extension, grasp/release, and in-hand manipulation. Pt requiring increased time and maximal effort, however able to complete task with only occasional CGA to place 20/20 pegs. Presented pegs at eye level to facilitate shoulder flexion during reach  with Pt able to bring RUE to ~90* this session. Mod VB cueing required to facilitate improved movement patterns of wrist extension, shoulder elevation, and wrist extension. Worked on maintained grasp with pt instructed to remove pegs from board using RUE and hand to therapist at shoulder level with Pt able to complete with min dropping. Frequent rest breaks required during session d/t fatigue and decreased activity tolerance. Squat pivot EOM>wc to R min A. Pt was left resting in wc with call bell in reach, seat belt alarm on, and all needs met.    Therapy Documentation Precautions:  Precautions Precautions: Fall Precaution Comments: right sided hemiparesis, expressive deficits Restrictions Weight Bearing Restrictions: No General:   Vital Signs: Therapy Vitals Temp: 98 F (36.7 C) Pulse Rate: 66 Resp: 20 BP: 114/85 Patient Position (if appropriate): Lying Oxygen Therapy SpO2: 100 % O2 Device: Room Air  Therapy/Group: Individual Therapy  Army Fossa 08/15/2022, 7:59 AM

## 2022-08-16 LAB — RENAL FUNCTION PANEL
Albumin: 3.6 g/dL (ref 3.5–5.0)
Anion gap: 11 (ref 5–15)
BUN: 106 mg/dL — ABNORMAL HIGH (ref 6–20)
CO2: 22 mmol/L (ref 22–32)
Calcium: 9.5 mg/dL (ref 8.9–10.3)
Chloride: 103 mmol/L (ref 98–111)
Creatinine, Ser: 4.33 mg/dL — ABNORMAL HIGH (ref 0.61–1.24)
GFR, Estimated: 16 mL/min — ABNORMAL LOW (ref >60)
Glucose, Bld: 99 mg/dL (ref 70–99)
Phosphorus: 6.2 mg/dL — ABNORMAL HIGH (ref 2.5–4.6)
Potassium: 4.4 mmol/L (ref 3.5–5.1)
Sodium: 136 mmol/L (ref 135–145)

## 2022-08-16 LAB — GLUCOSE, CAPILLARY
Glucose-Capillary: 200 mg/dL — ABNORMAL HIGH (ref 70–99)
Glucose-Capillary: 83 mg/dL (ref 70–99)
Glucose-Capillary: 91 mg/dL (ref 70–99)
Glucose-Capillary: 116 mg/dL — ABNORMAL HIGH (ref 70–99)

## 2022-08-16 NOTE — Progress Notes (Addendum)
PROGRESS NOTE   Subjective/Complaints:  Patient working with speech therapy today just finished physical therapy.  For speech initiation but increasing length of utterance.  No pain complaints.  Has not noted any movement in the right lower limb. ROS: Patient denies CP, SOB N/V/D.      Objective:   No results found. Recent Labs    08/14/22 0538  WBC 7.1  HGB 12.8*  HCT 39.0  PLT 295      Recent Labs    08/14/22 0538 08/15/22 0601  NA 136 137  K 4.7 5.1  CL 101 102  CO2 20* 23  GLUCOSE 95 101*  BUN 97* 104*  CREATININE 4.10* 4.12*  CALCIUM 9.6 9.6      Intake/Output Summary (Last 24 hours) at 08/16/2022 0849 Last data filed at 08/15/2022 2235 Gross per 24 hour  Intake 413 ml  Output 875 ml  Net -462 ml         Physical Exam: Vital Signs Blood pressure 98/74, pulse 79, temperature 98.5 F (36.9 C), resp. rate 17, height 5\' 9"  (1.753 m), weight 99.3 kg, SpO2 100 %.    General: No acute distress Mood and affect are appropriate Heart: Regular rate and rhythm no rubs murmurs or extra sounds Lungs: Clear to auscultation, breathing unlabored, no rales or wheezes Abdomen: Positive bowel sounds, soft nontender to palpation, nondistended Extremities: No clubbing, cyanosis, or edema Skin: No evidence of breakdown, no evidence of rash  Skin: No evidence of breakdown, no evidence of rash, calluses on knees  Neurologic:Alert and oriented x 3. Normal insight and awareness. Intact Memory. Normal language and speech. Cranial nerve exam unremarkable. MMT: Left-sided strength is normal RUE 3- prox to distal. RLE 0/5 HF, KE, KF and 0/5 distally. Sensory exam normal sensation to light touch and proprioception in bilateral upper and lower extremities Cerebellar exam unable to do on right due to weakness  Musculoskeletal: Full range of motion in all 4 extremities. No joint swelling     Assessment/Plan: 1.  Functional deficits which require 3+ hours per day of interdisciplinary therapy in a comprehensive inpatient rehab setting. Physiatrist is providing close team supervision and 24 hour management of active medical problems listed below. Physiatrist and rehab team continue to assess barriers to discharge/monitor patient progress toward functional and medical goals  Care Tool:  Bathing    Body parts bathed by patient: Abdomen, Chest   Body parts bathed by helper: Right arm, Left arm, Front perineal area, Buttocks, Right upper leg, Left upper leg, Right lower leg, Left lower leg     Bathing assist Assist Level: Total Assistance - Patient < 25%     Upper Body Dressing/Undressing Upper body dressing   What is the patient wearing?: Pull over shirt    Upper body assist Assist Level: Maximal Assistance - Patient 25 - 49%    Lower Body Dressing/Undressing Lower body dressing      What is the patient wearing?: Pants, Incontinence brief     Lower body assist Assist for lower body dressing: Maximal Assistance - Patient 25 - 49%     Toileting Toileting    Toileting assist Assist for toileting: Moderate Assistance -  Patient 50 - 74%     Transfers Chair/bed transfer  Transfers assist  Chair/bed transfer activity did not occur: N/A  Chair/bed transfer assist level: Minimal Assistance - Patient > 75%     Locomotion Ambulation   Ambulation assist      Assist level: 2 helpers (+2 mod A) Assistive device: Other (comment) (B HHA) Max distance: 182ft   Walk 10 feet activity   Assist  Walk 10 feet activity did not occur: Safety/medical concerns  Assist level: 2 helpers Assistive device: Other (comment)   Walk 50 feet activity   Assist Walk 50 feet with 2 turns activity did not occur: Safety/medical concerns  Assist level: 2 helpers Assistive device: Other (comment)    Walk 150 feet activity   Assist Walk 150 feet activity did not occur: Safety/medical  concerns         Walk 10 feet on uneven surface  activity   Assist Walk 10 feet on uneven surfaces activity did not occur: Safety/medical concerns         Wheelchair     Assist Is the patient using a wheelchair?: Yes Type of Wheelchair: Manual (TIS w/c)    Wheelchair assist level: Dependent - Patient 0%      Wheelchair 50 feet with 2 turns activity    Assist        Assist Level: Dependent - Patient 0%   Wheelchair 150 feet activity     Assist      Assist Level: Dependent - Patient 0%   Blood pressure 98/74, pulse 79, temperature 98.5 F (36.9 C), resp. rate 17, height 5\' 9"  (1.753 m), weight 99.3 kg, SpO2 100 %.  Medical Problem List and Plan: 1. 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 Formulized individual treatment plan based on medical history, underlying problem and comorbidities.  2.  Antithrombotics: -DVT/anticoagulation:  Pharmaceutical: Heparin 5000U q8h- amb HHA 120' may d/c heparin              -antiplatelet therapy: Aspirin 81 mg daily and Brilinta 90 mg twice daily 3. Pain Management: Tylenol as needed 4. Mood/Behavior/Sleep: Provide emotional support             -antipsychotic agents: N/A 5. Neuropsych/cognition: This patient is not capable of making decisions on his own behalf. 6. Skin/Wound Care: Routine skin checks 7. Fluids/Electrolytes/Nutrition: Routine in and outs with follow-up chemistries D/c CBG  CBG (last 3)  Recent Labs    08/15/22 1646 08/15/22 2217 08/16/22 0648  GLUCAP 108* 127* 200*     8.   Seizure prophylaxis.  EEG negative.  Continue Keppra 500 mg daily 9.  AKI on CKD stage IV.  Follow-up chemistries.  Follow-up outpatient nephrology services             -pt given lokelma prior to rehab admit             Nephro input appreciated 5/22  -recheck bmet again tomorrow  -will hold chlorthalidone  for now  -check weight, I/O's fairly balanced    Latest Ref Rng & Units 08/15/2022    6:01 AM 08/14/2022    5:38 AM 08/07/2022    7:30 AM  BMP  Glucose 70 - 99 mg/dL 161  95  096   BUN 6 - 20 mg/dL 045  97  90   Creatinine 0.61 - 1.24 mg/dL 4.09  8.11  9.14   Sodium 135 - 145 mmol/L 137  136  136   Potassium 3.5 - 5.1 mmol/L 5.1  4.7  4.2   Chloride 98 - 111 mmol/L 102  101  106   CO2 22 - 32 mmol/L 23  20  17    Calcium 8.9 - 10.3 mg/dL 9.6  9.6  9.3      10.  Hyperlipidemia.  Crestor 20mg  QD 11.  Permissive hypertension.  Patient on amlodipine 10 mg daily, hydralazine 50 mg twice daily, labetalol 200 mg twice daily prior to admission.  Resume as needed  -5/20--controlled with Chlorthalidone 25mg  QD     -hold for now---see #9 Vitals:   08/12/22 0438 08/12/22 1355 08/12/22 1956 08/13/22 0519  BP: 117/83 (!) 130/90 115/75 101/74   08/13/22 1453 08/13/22 2005 08/14/22 0501 08/14/22 1348  BP: 111/87 134/81 121/73 (!) 124/93   08/15/22 0614 08/15/22 1432 08/15/22 2049 08/16/22 0601  BP: 114/85 134/87 123/85 98/74    12.  Medical noncompliance.  Provide counseling 13. Constipation/incontinence of bowel: -08/05/22 no BM in 2 days per pt, documentation states LBM yesterday; will monitor for now, cont Colace 100mg  BID and miralax 17g QD, if no BM tomorrow then may need to adjust Cont BM 5/20  LOS: 15 days A FACE TO FACE EVALUATION WAS PERFORMED  Erick Colace 08/16/2022, 8:49 AM

## 2022-08-16 NOTE — Progress Notes (Signed)
Physical Therapy Session Note  Patient Details  Name: Joseph Bright MRN: [id number] Date of Birth: March 03, 1976  Today's Date: 08/16/2022 PT Individual Time: 0807-0830 PT Individual Time Calculation (min): 23 min   and  Today's Date: 08/16/2022 PT Co-Treatment Time: 4098-1191 (total co-tx time from 8:30-9:02) PT Co-Treatment Time Calculation (min): 15 min  Short Term Goals: Week 2:  PT Short Term Goal 1 (Week 2): Pt will perform supine<>sit with CGA PT Short Term Goal 2 (Week 2): Pt will perform sit<>stands using LRAD with min assist PT Short Term Goal 3 (Week 2): Pt will perform bed<>chair transfers using LRAD with min assist PT Short Term Goal 4 (Week 2): Pt will ambulate at least 31ft using LRAD with mod assist of 1 and +2 assist as needed PT Short Term Goal 5 (Week 2): Pt will navigate 4 stairs using B HRs with mod assist of 1 and +2 for safety as needed  Skilled Therapeutic Interventions/Progress Updates:    Pt received supine in bed with nephrology MD exiting upon therapist arrival. Pt eager to participate in therapy session. Supine>sitting R EOB, HOB slightly elevated and using L UE support on bedrails to assist with trunk upright and using L LE to assist with R LE management - min assist for trunk. Sitting EOB with supervision for balance, able to don sleeveless shirt with set-up assist and increased time for problem solving orientation of shirt with min verbal cuing and then thread on pants with pt using compensatory technique to place R LE in pants with verbal cuing for technique. Therapist donned socks, shoes, and R LE Matrix Max GRAFO total assist for time management.   Sit>stand EOB>no UE support with min assist for balance and therapist guarding R knee - pt continues to have strong compensatory techniques of L weight shift with strong L LE WBing bias - pulled pants up over hips with min assist for R side and cuing for pt to increase use of R UE to perform task.  L stand pivot to  w/c with strong L LE WBing bias with light min assist for balance while turning.    Transported to/from gym in w/c for time management and energy conservation.  OT joined session to provide skilled +2 assist for gait training to improve pt's weight shifting and to provide skilled cuing.  Gait training 32ft x3 reps (seated break between each with progression to performing turns through doorways) using +2 for L HHA with +2 min/mod assist for balance and R LE management. Pt demonstrating the following gait deviations with therapist providing the described cuing and facilitation for improvement:  - donned shoe cover on R toes for improved ability for pt to advance R LE during swing  - pt able to advance R LE 25-75% of the way depending on fatigue levels with decreased consistency compared to yesterday due to pt fatigue this AM, also resulting in decreased consistency of weight shifting onto L stance limb with pt requiring more frequent cuing for L weight shift to allow R LE advancement - continues to have good R quad activation during/throughout stance phase only requiring min guarding for safety - transitioned to PLS AFO on 3rd gait trial with pt continuing to have same consistency of knee stability compared to with GRAFO and pt reports PLS feeling better than GRAFO - pt did notice to try and start vaulting by going up on L toes during R swing phase but cuing to avoid developing compensatory techniques - cuing/education on consistently achieving reciprocal/positive  step length with L LE to allow improved ability to advance R LE during swing  At end of session, pt left seated in w/c as hand-off to SLP.   Therapy Documentation Precautions:  Precautions Precautions: Fall Precaution Comments: right sided hemiparesis, expressive deficits Restrictions Weight Bearing Restrictions: No   Pain:  No reports of pain throughout session.    Therapy/Group: Individual Therapy  Ginny Forth , PT, DPT,  NCS, CSRS 08/16/2022, 9:10 AM

## 2022-08-16 NOTE — Progress Notes (Signed)
Speech Language Pathology Weekly Progress and Session Note  Patient Details  Name: Joseph Bright MRN: [id number] Date of Birth: 10/20/1975  Beginning of progress report period: Aug 10, 2022 End of progress report period: Aug 16, 2022  Today's Date: 08/16/2022 SLP Individual Time: 0905-1000 SLP Individual Time Calculation (min): 55 min  Short Term Goals: Week 2: SLP Short Term Goal 1 (Week 2): Pt will utilize compensatory word-finding strategies to repair communication breakdowns with 75% accuracy given sup cues. SLP Short Term Goal 1 - Progress (Week 2): Progressing toward goal SLP Short Term Goal 2 (Week 2): Pt will complete picture descriptions with 80% acc given min (syllabic and carrier phrase) cues. SLP Short Term Goal 2 - Progress (Week 2): Met SLP Short Term Goal 3 (Week 2): Pt will answer complex and abstract yes/ no questions with 90% acc in conjunction with min cues. SLP Short Term Goal 3 - Progress (Week 2): Met    New Short Term Goals: Week 3: SLP Short Term Goal 1 (Week 3): Pt will utilize compensatory word-finding strategies at the sentence level to repair communication breakdowns with 75% accuracy given  min cues. SLP Short Term Goal 2 (Week 3): Pt will verbally respond to open ended wh questions with 80% acc given min A SLP Short Term Goal 3 (Week 3): Pt will produce 5-6 word sentences with 75% acc given min verbal cues SLP Short Term Goal 4 (Week 3): Pt will use written communication at the word level to repair communication breakdowns with 75% acc given min A  Weekly Progress Updates: Patient has made steady gains and has met 2 of 3 STG's this reporting period due to improved ability to answer complex yes/no questions and describe pictures. Currently, pt continues to require min A for expressive language. Pt/ family education ongoing. Pt would benefit from continued skilled SLP intervention to maximize expressive language in order to maximize his functional independence  prior to discharge.   Intensity: Minumum of 1-2 x/day, 30 to 90 minutes Frequency: 1 to 3 out of 7 days Duration/Length of Stay: June 6 Treatment/Interventions: Patient/family education;Therapeutic Activities;Cueing hierarchy;Functional tasks;Speech/Language facilitation;Multimodal communication approach   Daily Session  Skilled Therapeutic Interventions: Pt was alert and retrieved from gym for ST session. Pt was transitioned back to his room for the remainder of session. SLP addressed verbalizing picture descriptions and answering complex yes/ no questions. Pt described pictures with min A with 80% acc. Pt answered complex yes/ no questions with 95% acc with min A. Pt continued to present with some communication breakdowns during functional communication. Pt challenged to write response. Pt writing with left hand with some impairment in legibility however, SLP able to decipher word. Pt able to write to communicate during moments of hesitation with ability to verbalize word after writing it. Pt accurately wrote word to correct hesitation in 4/5 opportunities. Pt was left seated upright in [facility] with chair alarm active and call button within reach. SLP to continue POC.    Pain Pain Assessment Pain Scale: 0-10 Pain Score: 0-No pain  Therapy/Group: Individual Therapy  Renaee Munda 08/16/2022, 3:43 PM

## 2022-08-16 NOTE — Progress Notes (Signed)
Patient placed himself on cpap machine.

## 2022-08-16 NOTE — Progress Notes (Signed)
Patient ID: Joseph Bright, male   DOB: 1975/05/13, 48 y.o.   MRN: [id number]  Team Conference Report to Patient/Family  Team Conference discussion was reviewed with the patient and caregiver, including goals, any changes in plan of care and target discharge date.  Patient and caregiver express understanding and are in agreement.  The patient has a target discharge date of 08/31/22.  SW met with patient and left spouse, [name] a detailed VM. Spouse anticipates beginning family education on Monday. Pt making great progress and started working on steps. No additional questions or concerns currently. SW will wait for FU from spouse.   Andria Rhein 08/16/2022, 1:17 PM

## 2022-08-16 NOTE — Progress Notes (Signed)
Physical Therapy Weekly Progress Note  Patient Details  Name: Joseph Bright MRN: [id number] Date of Birth: 1975/05/27  Beginning of progress report period: Aug 09, 2022 End of progress report period: Aug 16, 2022  Today's Date: 08/16/2022 PT Individual Time: 1551-1650 PT Individual Time Calculation (min): 59 min   Patient has met 4 of 5 short term goals. [name] is progressing well with therapy demonstrating increasing independence with functional mobility. He is performing supine<>sit with min assist for trunk coming upright and for R LE management when lying back down. He is performing sit<>stands and stand pivot transfers with min assist demonstrating significant compensatory technique with primarily L LE WBing. He is participating in gait training with +2 mod assist for balance and therapist providing mod/max assist for R LE gait mechanics with pt demonstrating gradual return in extensors during closed chain and slight return in hip flexors. Pt will benefit from continued CIR level skilled physical therapy to further progress his independence with functional mobility prior to D/Cing home with 24hr assistance from family.   Patient continues to demonstrate the following deficits muscle weakness and muscle paralysis, decreased cardiorespiratoy endurance, impaired timing and sequencing, abnormal tone, unbalanced muscle activation, and decreased motor planning, decreased problem solving, and decreased sitting balance, decreased standing balance, decreased postural control, hemiplegia, and decreased balance strategies and therefore will continue to benefit from skilled PT intervention to increase functional independence with mobility.  Patient progressing toward long term goals..  Continue plan of care.  PT Short Term Goals Week 2:  PT Short Term Goal 1 (Week 2): Pt will perform supine<>sit with CGA PT Short Term Goal 1 - Progress (Week 2): Progressing toward goal PT Short Term Goal 2 (Week 2):  Pt will perform sit<>stands using LRAD with min assist PT Short Term Goal 2 - Progress (Week 2): Met PT Short Term Goal 3 (Week 2): Pt will perform bed<>chair transfers using LRAD with min assist PT Short Term Goal 3 - Progress (Week 2): Met PT Short Term Goal 4 (Week 2): Pt will ambulate at least 72ft using LRAD with mod assist of 1 and +2 assist as needed PT Short Term Goal 4 - Progress (Week 2): Met PT Short Term Goal 5 (Week 2): Pt will navigate 4 stairs using B HRs with mod assist of 1 and +2 for safety as needed PT Short Term Goal 5 - Progress (Week 2): Met Week 3:  PT Short Term Goal 1 (Week 3): Pt will perform supine<>sit with CGA consistently PT Short Term Goal 2 (Week 3): Pt will perform sit<>stands using LRAD with CGA PT Short Term Goal 3 (Week 3): Pt will perform bed<>chair transfers using LRAD with CGA PT Short Term Goal 4 (Week 3): Pt will ambulate at least 99ft in controlled environment using LRAD with min assist of 1 and +2 for safety as needed PT Short Term Goal 5 (Week 3): Pt will navigate 4 steps using HRs with min assist of 1 and +2 for safety as needed  Skilled Therapeutic Interventions/Progress Updates:  Ambulation/gait training;Balance/vestibular training;Cognitive remediation/compensation;Community reintegration;Discharge planning;Disease management/prevention;DME/adaptive equipment instruction;Functional electrical stimulation;Functional mobility training;Neuromuscular re-education;Pain management;Patient/family education;Psychosocial support;Skin care/wound management;Splinting/orthotics;Stair training;Therapeutic Activities;Therapeutic Exercise;UE/LE Strength taining/ROM;UE/LE Coordination activities;Visual/perceptual remediation/compensation;Wheelchair propulsion/positioning   Pt received sitting in wheelchair and agreeable to therapy session.   Pt already wearing R LE PLS AFO - pt continues to report liking it better than the GRAFO.  Pt reports need to use bathroom.  Sit>stand w/c>L UE support on footboard railing with light min assist  for standing balance pt able to manage LB clothing and urinal with only min assist for R side of clothing - continent of bladder (charted).    Transported to/from gym in w/c for time management and energy conservation.  Sit>stand w/c>UE support on litegait handles with light min assist - pt continuing to demo compensatory techniques of L weight shift with L LE WBing bias with R knee kept in partial flexed positioning while standing. Donned litegait harness with +2 assist for time management.   Gait training 10ft + 29ft + 40ft in litegait harness for safety, but not providing true BWS with +2 assist to manage litegait and therapist manually facilitating R LE gait mechanics. Pt demonstrating the following gait deviations with therapist providing the described cuing and facilitation for improvement: - donned shoe cover to create R LE toe cap for improved swing advancement - wearing R LE PLS AFO  - donned leg loop to assist with R LE swing phase advancement; however, more effective to use strap of AFO to assist - pt continues to require mod/max assist to advance R LE during swing with inconsistencies in ability to advance it  - starts with step-to pattern leading with R LE having short R stance time with quick L step forward as well as insufficient R hip/knee extension, although not buckling (keeps R knee partially flexed during stance)  - used black bungee cord on R hip to promote R hip extension and forward progression of pelvis  Doffed harness as described above.  Standing without UE support and mirror feedback targeting R LE NMR for improved stance control while stepping L LE forward/backwards - therapist providing heavy mod assist for balance and blocking R knee while facilitating increased R hip/knee extension - x10 reps.   Transported back to room and pt requesting to return to bed due to fatigue. R squat pivot w/c>EOB with  min assist for pivoting hips and therapist manually placing R LE prior to initiating transfer. Sit>supine with min assist for R LE management into bed. Pt left supine in bed with needs in reach and bed alarm on.    Therapy Documentation Precautions:  Precautions Precautions: Fall Precaution Comments: right sided hemiparesis, expressive deficits Restrictions Weight Bearing Restrictions: No   Pain:  No reports of pain throughout session.   Therapy/Group: Individual Therapy  Ginny Forth , PT, DPT, NCS, CSRS 08/16/2022, 7:53 AM

## 2022-08-16 NOTE — Progress Notes (Signed)
  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 Since medical stabilization on 5/8 patient has been undergoing rehabilitation in CIR here at [facility]. Patient did have some loose bowel movements on 5/12 and has been intermittently incontinent of bowels over the last week. Per chart review did have two large BM day before consultation. He has been eating and drinking normally, no issues with urination, leg swelling, cough, dyspnea.   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 Baseline appears to be closer to 3.0-3.2. There was a minor increase in creatinine 5/10 when anti-hypertensives switched to chlorthalidone. 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 He is urinating well, suspect this AKI is due to contrast nephropathy with multiple recent CT scans.   - Labs have not been drawn this morning, we will follow-up with this. - Overall asymptomatic, continues to do well with good appetite, urine output. - Appears comfortable, euvolemic on exam. No emergent needs for RRT/HD.  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   Subjective:   Denies dyspnea, cough, abdominal pain, n/v/d, difficulty with urination/decreased urine.    Objective:   BP 98/74 (BP Location: Right Arm)   Pulse 79   Temp 98.5 F (36.9 C)   Resp 17   Ht 5\' 9"  (1.753 m)   Wt 99.3 kg   SpO2 100%   BMI 32.34 kg/m   Intake/Output Summary (Last 24 hours) at 08/16/2022 0751 Last data filed at 08/15/2022 2235 Gross per 24 hour  Intake 413 ml  Output 875 ml  Net -462 ml    Weight change:   Physical Exam: General: Resting in bed in no  acute distress HEENT: NCAT, moist mucous membranes Neck: Supple, no JVD Heart: Regular rate, rhythm. No murmurs. Warm extremities.  Lungs: Clear to auscultation bilaterally, no rales.  Abdomen: Soft, non-tender, non-distended. Normoactive bowel sounds.  Extremities: No peripheral edema noted Skin: No rashes or lesions. Neuro: Awake, alert, conversing appropriately.   Imaging: No results found.  Labs: BMET Recent Labs  Lab 08/14/22 0538 08/15/22 0601  NA 136 137  K 4.7 5.1  CL 101 102  CO2 20* 23  GLUCOSE 95 101*  BUN 97* 104*  CREATININE 4.10* 4.12*  CALCIUM 9.6 9.6    CBC Recent Labs  Lab 08/14/22 0538  WBC 7.1  HGB 12.8*  HCT 39.0  MCV 94.0  PLT 295     Medications:     aspirin  81 mg Oral Daily   docusate sodium  100 mg Oral Daily   feeding supplement (NEPRO CARB STEADY)  237 mL Oral BID BM   heparin  5,000 Units Subcutaneous Q8H   insulin aspart  0-15 Units Subcutaneous TID WC   levETIRAcetam  500 mg Oral Q2200   polyethylene glycol  17 g Oral Daily   rosuvastatin  20 mg Oral Daily   sodium bicarbonate  1,300 mg Oral BID   ticagrelor  90 mg Oral BID    Evlyn Kanner, MD 08/16/2022, 7:51 AM

## 2022-08-16 NOTE — Progress Notes (Signed)
Occupational Therapy Weekly Progress Note  Patient Details  Name: Joseph Bright MRN: [id number] Date of Birth: November 23, 1975  Beginning of progress report period: Aug 09, 2022 End of progress report period: Aug 16, 2022  Today's Date: 08/16/2022 OT Individual Time: 7829-5621 OT Individual Time Calculation (min): 70 min  and Today's Date: 08/16/2022 OT Co-Treatment Time: 0845 (full co-treat time 8:30-09:02)-0902 OT Co-Treatment Time Calculation (min): 17 min   Patient has met 4 of 4 short term goals.  Pt is motivated to participate in therapy sessions and is supported by his family and friends who visit and inquire about his progress often. Family education is planned to begin next week. Pt is able to complete BADLs at levels listed below using RUE ~25% of the time with mod VB cueing.  Patient continues to demonstrate the following deficits: muscle weakness, decreased cardiorespiratoy endurance, impaired timing and sequencing, abnormal tone, and unbalanced muscle activation, decreased attention to right, central origin, and decreased sitting balance, decreased standing balance, decreased postural control, hemiplegia, and decreased balance strategies and therefore will continue to benefit from skilled OT intervention to enhance overall performance with BADL, iADL, and Reduce care partner burden.  Patient progressing toward long term goals..  Continue plan of care.  OT Short Term Goals Week 2:  OT Short Term Goal 1 (Week 2): Pt will recall hemidressing technique with min questioning cus consistently OT Short Term Goal 1 - Progress (Week 2): Met OT Short Term Goal 2 (Week 2): Pt will complete squat pivot transfers in both directions with min A OT Short Term Goal 2 - Progress (Week 2): Met OT Short Term Goal 3 (Week 2): Pt will maintain standing balance with min A consistently for BADLs LRAD PRN OT Short Term Goal 3 - Progress (Week 2): Met OT Short Term Goal 4 (Week 2): Pt will don pants mod A  consistently OT Short Term Goal 4 - Progress (Week 2): Met Week 3:  OT Short Term Goal 1 (Week 3): Pt will utilize RUE during functional tasks 50% of time with supervision OT Short Term Goal 2 (Week 3): Pt wil complete squat pivot toilet transfers min A consistently using LRAD OT Short Term Goal 3 (Week 3): Pt will complete sit>stands CGA consistently  Skilled Therapeutic Interventions/Progress Updates:     AM Session:  Pt received in therapy gym with PT with AFO donned and all BADL needs met. Pt in good spirits and receptive to skilled co-treat session reporting 0/10 pain. Focus this session functional mobility and endurance training with PT assisting with RLE facilitation and stabilization. OT facilitating weight shifting through HHA, tactile cueing, and VB cueing as well as VB cueing for step length, safety, and awareness. Pt able to complete 3 trials of ~86 ft of functional mobility training with rest breaks provided between trials. Pt with improved trunk control/awareness and improved step length during functional mobility during 3rd trial compared to first two with fading VB cueing from mod to min cues required. Pt requiring mod A for RLE facilitation +2 min/mod HHA for weight shifting and balance. Pt handed off to SLP at end of session with all needs met.   PM Session: Pt received siting up in wc presenting to be in good spirits receptive to skilled OT session reporting 0/10 pain- OT offering intermittent rest breaks, repositioning, and therapeutic support to optimize participation in therapy session. Pt receptive to taking shower this session and reporting need to use bathroom. Pt complete stand pivot transfer wc>toilet using grab bars  with min A OT blocking RLE and providing tactile cueing to facilitate quad engagement. Provided ++time on toilet d/t need for BM. Educated Pt on safety during toileting and worked on increasing Pt's independence in task with RUE supported on grab bar and OT  stabilizing RLE, Pt able to perform posterior peri-care. Brought pants to waist with OT providing assistance for balance, RLE stabilization, and to fully bring over R hip. Stand pivot back to wc min A with grab bars. Educated Pt on shower transfer with Pt able to complete with min A using grab bars. Stood with grab bars to doff pants min A with mod cueing for technique and doffed shirt supervision seated on TTB. Focused on RUE use during bathing with Pt instructed to se at lest 50% of time. Pt requiring mod VB cues to initiate RUE use as Pt prefers to compensate for decreased strength by using LUE. Educated on long-handled sponge use with Pt able to utilize to wash BLEs and back with supervision. Leaned R/L to wash bottom with min A provided to ensure cleanliness. Squat pivot TTB>wc min A using grab bars with mod cueing for technique and safety. Pt completed dressing tasks seated in wc donning shirt using hemi-dressing technique supervision with min VB cueing and pants min A to bring to waist in standing with LUE supported on bed rail. Socks, shoes, and AFO donned total A for time management and energy conservation. Pt was left resting in wc with call bell in reach, seat belt alarm on, and all needs met.    Therapy Documentation Precautions:  Precautions Precautions: Fall Precaution Comments: right sided hemiparesis, expressive deficits Restrictions Weight Bearing Restrictions: No General:   Vital Signs: Therapy Vitals Temp: 98.5 F (36.9 C) Pulse Rate: 79 Resp: 17 BP: 98/74 Patient Position (if appropriate): Lying Oxygen Therapy SpO2: 100 % O2 Device: CPAP   ADL: ADL Eating: Set up Where Assessed-Eating: Wheelchair Grooming: Minimal assistance Where Assessed-Grooming: Sitting at sink Upper Body Bathing: Minimal assistance Where Assessed-Upper Body Bathing: Edge of bed Lower Body Bathing: Moderate assistance Where Assessed-Lower Body Bathing: Sitting at sink Upper Body Dressing:  Moderate assistance Where Assessed-Upper Body Dressing: Sitting at sink Lower Body Dressing: Maximal assistance, Moderate assistance Where Assessed-Lower Body Dressing: Sitting at sink Toileting: Maximal assistance Where Assessed-Toileting: Teacher, adult education: Moderate assistance Toilet Transfer Method: Squat pivot   Therapy/Group: Individual Therapy  Army Fossa 08/16/2022, 7:57 AM

## 2022-08-16 NOTE — Patient Care Conference (Cosign Needed Addendum)
Inpatient RehabilitationTeam Conference and Plan of Care Update Date: 08/16/2022   Time: 10:50 AM    Patient Name: Joseph Bright      Medical Record Number: [id number]  Date of Birth: 01/03/1976 Sex: Male         Room/Bed: 4W07C/4W07C-01 Payor Info: Payor: CIG[employer] / Plan: CIGNA MANAGED / Product Type: *No Product type* /    Admit Date/Time:  08/01/2022  4:34 PM  Primary Diagnosis:  Cerebrovascular accident (CVA) due to occlusion of left posterior communicating artery [facility], [place])  Hospital Problems: Principal Problem:   Cerebrovascular accident (CVA) due to occlusion of left posterior communicating artery (HCC) Active Problems:   Adjustment disorder with mixed anxiety and depressed mood    Expected Discharge Date: Expected Discharge Date: 08/31/22  Team Members Present: Physician leading conference: Dr. Claudette Laws Social Worker Present: Lavera Guise, BSW Nurse Present: Chana Bode, RN PT Present: Casimiro Needle, PT OT Present: Bonnell Public, OT SLP Present: Feliberto Gottron, SLP PPS Coordinator present : Fae Pippin, SLP     Current Status/Progress Goal Weekly Team Focus  Bowel/Bladder   Pt is continent/incontinent of bowel/bladder.  On q3-4hrs toileting.   Pt will gain continence of bowel/bladder   Will assess qshift and PRN    Swallow/Nutrition/ Hydration               ADL's   UB dressing close supervision, LB dressing min A, UB bathing min A, LB bathing mod A, min A groomin/hygiene tasks, mod A toileting, min/mod squat pivot to toilet/BSC- improved activation of RUE w   supervision to min A   BADL retraining, functional transfer trianing, RUE NMR, dynamic sitting balance, standing balance, activity tolerance, Pt education    Mobility   min A bed mobility only for R LE management using bedrails, min A squat pivot transfers using w/c armrests, min A sit<>stands using L UE support, gait training up to 110ft using +2 L HHA with mod assist from therapist for balance  and management of R LE management (advances R LE 25-75% during swing and stabilzes R LE during stance with only min guarding), progressed to stair navigation training step-to pattern using L UE support on each HR with mod A of 1 and +2 guarding for safety   supervision bed mobility, min assist transfers, mod assist gait, mod-I w/c mobility  pt education, transfer training, standing balance, R hemibody NMR, gait training, activity tolerance/cardiopulmonary endurance, stair navigation training    Communication   non fluent expressive language deficits. Overall min to Mod A for functional communication   Min A   functional communication, carryover of compensatory strategies, answering complex wh and yes/ no questions    Safety/Cognition/ Behavioral Observations               Pain   Pt currently denies pain   Pt will continue to deny pain   Will assess qshift and PRN    Skin   Pt's skin is intact   Pt's skin will remain intact  Will assess qshift and PRN      Discharge Planning:  Discharging home with  spouse hwo [facility].  Spouse attending 1st education session on 5/27 1-3 PM. [facility] for DME.  Barrier: Pt has 15 steps to enter the home & insurance for FU.   Team Discussion: Patient has good return in right UE and using compensatory strategies.  Continue to note non fluent expressive aphasia, but able to express wants and make basic needs known.   Patient on target  to meet rehab goals: yes, currently needs close supervision to complete UE ADLs at the edge of the bed. Needs min assist for lower body care and mod assist for lower body dressing and toileting.  Completes sit - stand with min assist and able to ambulate using HHA up to 120'. Goals for discharge set for supervision overall.   *See Care Plan and progress notes for long and short-term goals.   Revisions to Treatment Plan:  Nephrology consult  Working on closed chair mobility, stair training, increased carry over, and  comprehension of complex information. [place] AFO, and shoe tip cover  Teaching Needs: Safety, medications, dietary modifications, transfers, toileting, etc.   Current Barriers to Discharge: Decreased caregiver support, Home enviroment access/layout, and lack of insurance for equipment  Possible Resolutions to Barriers: Family education OP follow up  DME: TBD     Medical Summary Current Status: remains aphasic, RUE strength improved, still with RLE weakness  Barriers to Discharge: Morbid Obesity   Possible Resolutions to [employer] Focus: Increase fxnl use RUE, work on communication   Continued Need for Acute Rehabilitation Level of Care: The patient requires daily medical management by a physician with specialized training in physical medicine and rehabilitation for the following reasons: Direction of a multidisciplinary physical rehabilitation program to maximize functional independence : Yes Medical management of patient stability for increased activity during participation in an intensive rehabilitation regime.: Yes Analysis of laboratory values and/or radiology reports with any subsequent need for medication adjustment and/or medical intervention. : Yes   I attest that I was present, lead the team conference, and concur with the assessment and plan of the team.   Chana Bode B 08/16/2022, 2:17 PM

## 2022-08-17 LAB — GLUCOSE, CAPILLARY
Glucose-Capillary: 100 mg/dL — ABNORMAL HIGH (ref 70–99)
Glucose-Capillary: 57 mg/dL — ABNORMAL LOW (ref 70–99)
Glucose-Capillary: 62 mg/dL — ABNORMAL LOW (ref 70–99)
Glucose-Capillary: 75 mg/dL (ref 70–99)

## 2022-08-17 LAB — RENAL FUNCTION PANEL
Albumin: 4 g/dL (ref 3.5–5.0)
Anion gap: 14 (ref 5–15)
BUN: 107 mg/dL — ABNORMAL HIGH (ref 6–20)
CO2: 23 mmol/L (ref 22–32)
Calcium: 10 mg/dL (ref 8.9–10.3)
Chloride: 101 mmol/L (ref 98–111)
Creatinine, Ser: 4.32 mg/dL — ABNORMAL HIGH (ref 0.61–1.24)
GFR, Estimated: 16 mL/min — ABNORMAL LOW (ref >60)
Glucose, Bld: 75 mg/dL (ref 70–99)
Phosphorus: 5.5 mg/dL — ABNORMAL HIGH (ref 2.5–4.6)
Potassium: 5 mmol/L (ref 3.5–5.1)
Sodium: 138 mmol/L (ref 135–145)

## 2022-08-17 LAB — VITAMIN D 25 HYDROXY (VIT D DEFICIENCY, FRACTURES): Vit D, 25-Hydroxy: 15.95 ng/mL — ABNORMAL LOW (ref 30–100)

## 2022-08-17 MED ORDER — SEVELAMER CARBONATE 800 MG PO TABS
800.0000 mg | ORAL_TABLET | Freq: Three times a day (TID) | ORAL | Status: DC
  Administered 2022-08-17 – 2022-08-31 (×42): 800 mg via ORAL
  Filled 2022-08-17 (×42): qty 1

## 2022-08-17 NOTE — Progress Notes (Signed)
Patient ID: Joseph Bright, male   DOB: 1975-06-18, 47 y.o.   MRN: [id number]  Drop arm commode and TTB ordered through [employer]. If [facility] able to manage, SW will cancel order through [employer].

## 2022-08-17 NOTE — Progress Notes (Signed)
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 Since medical stabilization on 5/8 patient has been undergoing rehabilitation in CIR here at [facility]. Patient did have some loose bowel movements on 5/12 and has been intermittently incontinent of bowels over the last week. Per chart review did have two large BM day before consultation. He has been eating and drinking normally, no issues with urination, leg swelling, cough, dyspnea.   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 Baseline appears to be closer to 3.0-3.2. There was a minor increase in creatinine 5/10 when anti-hypertensives switched to chlorthalidone. 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 He is urinating well, suspect this AKI is due to contrast nephropathy with multiple recent CT scans; contrast CT angio head 4/30 (75ml), lysis 4/30 with 80mL of contrast given, and then a stent on 5/13 80mL for a IC stent.   - Overall asymptomatic, continues to do well with good appetite, urine output may be dropping but clinically no issues w/ oxygenation or dyspnea.  - Appears comfortable, euvolemic on exam. No emergent needs for RRT/HD.  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 - Will also check a PTH, 25vitD -> start renvela 1 TIDM (P6.2). Renal diet as well - Would like to hold dialysis as long as possible to give him a chance to recover some renal function. High likelihood of being ESRD if we start dialysis given his advanced CKD4. He understands this and agrees.   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   Subjective:   Denies dyspnea, cough, abdominal pain, n/v/d, difficulty with  urination/decreased urine.    Objective:   BP 123/75 (BP Location: Left Arm)   Pulse 66   Temp 98 F (36.7 C)   Resp 17   Ht 5\' 9"  (1.753 m)   Wt 99.3 kg   SpO2 99%   BMI 32.34 kg/m   Intake/Output Summary (Last 24 hours) at 08/17/2022 0732 Last data filed at 08/17/2022 0520 Gross per 24 hour  Intake 200 ml  Output 350 ml  Net -150 ml   Weight change:   Physical Exam: General: Resting in bed in no acute distress HEENT: NCAT, moist mucous membranes Neck: Supple, no JVD Heart: Regular rate, rhythm. No murmurs. Warm extremities.  Lungs: Clear to auscultation bilaterally, no rales.  Abdomen: Soft, non-tender, non-distended. Normoactive bowel sounds.  Extremities: No peripheral edema noted Skin: No rashes or lesions. Neuro: Awake, alert, conversing appropriately.   Imaging: No results found.  Labs: BMET Recent Labs  Lab 08/14/22 0538 08/15/22 0601 08/16/22 0750  NA 136 137 136  K 4.7 5.1 4.4  CL 101 102 103  CO2 20* 23 22  GLUCOSE 95 101* 99  BUN 97* 104* 106*  CREATININE 4.10* 4.12* 4.33*  CALCIUM 9.6 9.6 9.5  PHOS  --   --  6.2*   CBC Recent Labs  Lab 08/14/22 0538  WBC 7.1  HGB 12.8*  HCT 39.0  MCV 94.0  PLT 295    Medications:     aspirin  81 mg Oral Daily   docusate sodium  100 mg Oral Daily   feeding supplement (NEPRO CARB STEADY)  237 mL  Oral BID BM   levETIRAcetam  500 mg Oral Q2200   polyethylene glycol  17 g Oral Daily   rosuvastatin  20 mg Oral Daily   sodium bicarbonate  1,300 mg Oral BID   ticagrelor  90 mg Oral BID

## 2022-08-17 NOTE — Progress Notes (Signed)
PROGRESS NOTE   Subjective/Complaints:  Appreciate nephro eval   ROS: Patient denies CP, SOB N/V/D.      Objective:   No results found. No results for input(s): "WBC", "HGB", "HCT", "PLT" in the last 72 hours.    Recent Labs    08/15/22 0601 08/16/22 0750  NA 137 136  K 5.1 4.4  CL 102 103  CO2 23 22  GLUCOSE 101* 99  BUN 104* 106*  CREATININE 4.12* 4.33*  CALCIUM 9.6 9.5      Intake/Output Summary (Last 24 hours) at 08/17/2022 0749 Last data filed at 08/17/2022 0520 Gross per 24 hour  Intake 200 ml  Output 350 ml  Net -150 ml         Physical Exam: Vital Signs Blood pressure 123/75, pulse 66, temperature 98 F (36.7 C), resp. rate 17, height 5\' 9"  (1.753 m), weight 99.3 kg, SpO2 99 %.    General: No acute distress Mood and affect are appropriate Heart: Regular rate and rhythm no rubs murmurs or extra sounds Lungs: Clear to auscultation, breathing unlabored, no rales or wheezes Abdomen: Positive bowel sounds, soft nontender to palpation, nondistended Extremities: No clubbing, cyanosis, or edema Skin: No evidence of breakdown, no evidence of rash  Skin: No evidence of breakdown, no evidence of rash, calluses on knees  Neurologic:Alert and oriented x 3. Normal insight and awareness. Intact Memory. Normal language and speech. Cranial nerve exam unremarkable. MMT: Left-sided strength is normal RUE 4- prox to distal. RLE 0/5 HF, KE, KF and 0/5 distally. Sensory exam normal sensation to light touch and proprioception in bilateral upper and lower extremities Cerebellar exam unable to do on right LE due to weakness , intact RUE FNF Musculoskeletal: Full range of motion in all 4 extremities. No joint swelling     Assessment/Plan: 1. Functional deficits which require 3+ hours per day of interdisciplinary therapy in a comprehensive inpatient rehab setting. Physiatrist is providing close team supervision  and 24 hour management of active medical problems listed below. Physiatrist and rehab team continue to assess barriers to discharge/monitor patient progress toward functional and medical goals  Care Tool:  Bathing    Body parts bathed by patient: Abdomen, Chest   Body parts bathed by helper: Right arm, Left arm, Front perineal area, Buttocks, Right upper leg, Left upper leg, Right lower leg, Left lower leg     Bathing assist Assist Level: Total Assistance - Patient < 25%     Upper Body Dressing/Undressing Upper body dressing   What is the patient wearing?: Pull over shirt    Upper body assist Assist Level: Maximal Assistance - Patient 25 - 49%    Lower Body Dressing/Undressing Lower body dressing      What is the patient wearing?: Pants, Incontinence brief     Lower body assist Assist for lower body dressing: Maximal Assistance - Patient 25 - 49%     Toileting Toileting    Toileting assist Assist for toileting: Moderate Assistance - Patient 50 - 74%     Transfers Chair/bed transfer  Transfers assist  Chair/bed transfer activity did not occur: N/A  Chair/bed transfer assist level: Minimal Assistance - Patient > 75%  Locomotion Ambulation   Ambulation assist      Assist level: 2 helpers (+2 mod A) Assistive device: Other (comment) (B HHA) Max distance: 127ft   Walk 10 feet activity   Assist  Walk 10 feet activity did not occur: Safety/medical concerns  Assist level: 2 helpers Assistive device: Other (comment)   Walk 50 feet activity   Assist Walk 50 feet with 2 turns activity did not occur: Safety/medical concerns  Assist level: 2 helpers Assistive device: Other (comment)    Walk 150 feet activity   Assist Walk 150 feet activity did not occur: Safety/medical concerns         Walk 10 feet on uneven surface  activity   Assist Walk 10 feet on uneven surfaces activity did not occur: Safety/medical concerns          Wheelchair     Assist Is the patient using a wheelchair?: Yes Type of Wheelchair: Manual (TIS w/c)    Wheelchair assist level: Dependent - Patient 0%      Wheelchair 50 feet with 2 turns activity    Assist        Assist Level: Dependent - Patient 0%   Wheelchair 150 feet activity     Assist      Assist Level: Dependent - Patient 0%   Blood pressure 123/75, pulse 66, temperature 98 F (36.7 C), resp. rate 17, height 5\' 9"  (1.753 m), weight 99.3 kg, SpO2 99 %.  Medical Problem List and Plan: 1. 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 Formulized individual treatment plan based on medical history, underlying problem and comorbidities.  2.  Antithrombotics: -DVT/anticoagulation:  Pharmaceutical: Heparin 5000U q8h- amb HHA 120' may d/c heparin              -antiplatelet therapy: Aspirin 81 mg daily and Brilinta 90 mg twice daily 3. Pain Management: Tylenol as needed 4. Mood/Behavior/Sleep: Provide emotional support             -antipsychotic agents: N/A 5. Neuropsych/cognition: This patient is not capable of making decisions on his own behalf. 6. Skin/Wound Care: Routine skin checks 7. Fluids/Electrolytes/Nutrition: Routine in and outs with follow-up chemistries D/c CBG  CBG (last 3)  Recent Labs    08/16/22 1707 08/16/22 2120 08/17/22 0640  GLUCAP 91 116* 100*     8.  Seizure prophylaxis.  EEG negative.  Continue Keppra 500 mg daily 9.  AKI on CKD stage IV.  Follow-up chemistries.  Follow-up outpatient nephrology services             -pt  given lokelma prior to rehab admit             Nephro input appreciated 5/22  -recheck bmet again tomorrow  -will hold chlorthalidone for now  -check weight, I/O's fairly balanced    Latest Ref Rng & Units 08/16/2022    7:50 AM 08/15/2022    6:01 AM 08/14/2022    5:38 AM  BMP  Glucose 70 - 99 mg/dL 99  161  95   BUN 6 - 20 mg/dL 096  045  97   Creatinine 0.61 - 1.24 mg/dL 4.09  8.11  9.14   Sodium 135 - 145 mmol/L 136  137  136   Potassium 3.5 - 5.1 mmol/L 4.4  5.1  4.7   Chloride 98 - 111 mmol/L 103  102  101   CO2 22 - 32 mmol/L 22  23  20    Calcium 8.9 - 10.3 mg/dL 9.5  9.6  9.6      10.  Hyperlipidemia.  Crestor 20mg  QD 11.  Permissive hypertension.  Patient on amlodipine 10 mg daily, hydralazine 50 mg twice daily, labetalol 200 mg twice daily prior to admission.  Resume as needed  -5/20--controlled with Chlorthalidone 25mg  QD     -hold for now---see #9 Vitals:   08/13/22 0519 08/13/22 1453 08/13/22 2005 08/14/22 0501  BP: 101/74 111/87 134/81 121/73   08/14/22 1348 08/15/22 0614 08/15/22 1432 08/15/22 2049  BP: (!) 124/93 114/85 134/87 123/85   08/16/22 0601 08/16/22 1459 08/16/22 2116 08/17/22 0518  BP: 98/74 113/85 (!) 99/42 123/75    12.  Medical noncompliance.  Provide counseling 13. Constipation/incontinence of bowel: -08/05/22 no BM in 2 days per pt, documentation states LBM yesterday; will monitor for now, cont Colace 100mg  BID and miralax 17g QD, if no BM tomorrow then may need to adjust Cont BM 5/20  LOS: 16 days A FACE TO FACE EVALUATION WAS PERFORMED  Erick Colace 08/17/2022, 7:49 AM

## 2022-08-17 NOTE — Progress Notes (Signed)
Occupational Therapy Session Note  Patient Details  Name: Joseph Bright MRN: [id number] Date of Birth: [birth year]  Today's Date: 08/17/2022 OT Individual Time: 0803-0900 OT Individual Time Calculation (min): 57 min  OT Individual Time: 1110-1200 OT Individual Time Calculation (min):  50 min   Short Term Goals: Week 3:  OT Short Term Goal 1 (Week 3): Pt will utilize RUE during functional tasks 50% of time with supervision OT Short Term Goal 2 (Week 3): Pt wil complete squat pivot toilet transfers min A consistently using LRAD OT Short Term Goal 3 (Week 3): Pt will complete sit>stands CGA consistently  Skilled Therapeutic Interventions/Progress Updates:     AM Session: Pt received semi-reclined in bed presenting to be in good spirits receptive to skilled OT session reporting 0/10 pain- OT offering intermittent rest breaks, repositioning, and therapeutic support to optimize participation in therapy session. Pt reporting no need to use restroom this AM.   Transitioned to EOB with min A to lift trunk with HOB slightly elevated. Pt able to donn shirt sitting EOB with supervision min VB cues required for technique,. Weave feet into pants sitting EOB with supervision +time with min VB cues provided for technique. Stood with min A to bring pants to waist while holding bed rail with LUE and OT blocking RLE. Assistance required to fully bring pants over R hip. Squat pivot to L EOB>WC min A with min VB cues for hand placement.   Engaged Pt in grooming/hygiene tasks seated at sink. Pt noted to attempt to compensate for decreased RUE strength by not using RUE during BADL. Provided education on "retraining your brain" to learn to use the RUE again as Pt has improved functional return of RUE. Discussed providing self HOH during grooming tasks such as brushing teeth or washing face using RUE to grasp items and LUE as a support to improve controled movements and provide adequate pressure. Following education, Pt  able to utilize RUE ~50% of task with [facility] A provided for remainder of task.   Transported Pt total A to therapy gym in wc for time management and energy conservation. Squat pivot wc>EOM min A to L.   Engaged pt in seated UB exercises using 3# weighted dowel to increase BUE reciprocal arm movements, improved active muscle activation, increased body awareness through proprioceptive feedback, and increased overall strength for BADLs. Pt requiring increased time for rest breaks in-between sets and min tactile cues for muscle activation. Mod VB cues required to attend to RUE and maintain grasp on dowel. Pt completed 3x10 reps of the following exercises: -Biceps curls -Overhead press -Anterior shoulder flexion  Squat pivot EOM>wc to R min A with min VB cues for technique. Pt transported back to room total A in wc. Pt was left resting in wc with call bell in reach, seat belt alarm on, and all needs met.    PM Session: Pt received sitting up in wc presenting to be in good spirits receptive to skilled OT session reporting 0/10 pain, however fatigued following previous PT session- OT offering intermittent rest breaks, repositioning, and therapeutic support to optimize participation in therapy session.   Pt transported total A to therapy gym for time management and energy conservation.   Engaged Pt in seated FM/VM functional reaching task with targeted reach, maintained pinch, and wrist extension facilitated into task. Pt seated at table and instructed to donn magnetic checkers onto vertically slanted checker board. Pt demonstrating challenges with in-hand manipulation when positioning checkers onto board and difficulty with maintained  grasp with mod dropping noted. Pt requiring significantly increased amount of time to rest during activity d/t muscle fatigue. Pt able to lift shoulder 90* to don checkers at eye level, however fatiguing quickly. Pt able to complete targeted reach with 75% accuracy, no dysmetria  noted.   Engaged pt in bean bag toss activity with posterior reaching, sit<>stands, and dynamic standing facilitated in task. OT positioned on R sid of Pt to block R knee to prevent buckling and providing tactile cues for weight shifting and RLE quad engagement. Bean bags presented posteriorly to Pt with pt retrieving them with is LUE to simulate posterior cleaning and clothing management during toileting tasks. Pt able to maintain dynamic standing balance with min/mod A during activity standing ~45 seconds x4 trials with prolonged seated rest breaks provided between trials. Transitioned from sitting to standing x4 trials with min A and RLE blocked. During rest breaks, engaged pt in throwing activity using RUE to throw bean bags at target to facilitate improved automatic grasp/release, functional movement patterns, and work on grading force. Pt able to complete with mod VB cues to maintain grasps with premature grasp occurring ~75% of time with decreased force required to complete throw.   Applied Sabeo to Pt's R deltoid at end of session for improved R shoulder activation. Pt able to tolerate 4 clicks. Plans to return to see Pt in 1 hour to remove and provide skin check.   Squat pivot EOM>WC to L min A. Pt transported back to room total A in wc.Pt was left resting in wc with call bell in reach, seat belt alarm on, and all needs met.    Therapy Documentation Precautions:  Precautions Precautions: Fall Precaution Comments: right sided hemiparesis, expressive deficits Restrictions Weight Bearing Restrictions: No   Vital Signs: Therapy Vitals Temp: 98 F (36.7 C) Pulse Rate: 66 Resp: 17 BP: 123/75 Patient Position (if appropriate): Lying Oxygen Therapy SpO2: 99 % O2 Device: CPAP  Therapy/Group: Individual Therapy  Army Fossa 08/17/2022, 8:04 AM

## 2022-08-17 NOTE — Progress Notes (Signed)
Physical Therapy Session Note  Patient Details  Name: Joseph Bright MRN: [id number] Date of Birth: 02-25-1976  Today's Date: 08/17/2022 PT Individual Time: 1010-1105 and 1640-1733 PT Individual Time Calculation (min): 55 min and 53 min  Short Term Goals: Week 3:  PT Short Term Goal 1 (Week 3): Pt will perform supine<>sit with CGA consistently PT Short Term Goal 2 (Week 3): Pt will perform sit<>stands using LRAD with CGA PT Short Term Goal 3 (Week 3): Pt will perform bed<>chair transfers using LRAD with CGA PT Short Term Goal 4 (Week 3): Pt will ambulate at least 50ft in controlled environment using LRAD with min assist of 1 and +2 for safety as needed PT Short Term Goal 5 (Week 3): Pt will navigate 4 steps using HRs with min assist of 1 and +2 for safety as needed  Skilled Therapeutic Interventions/Progress Updates:    Session 1: Pt received sitting in w/c and eager to participate in therapy session.  Transported to/from gym in w/c for time management and energy conservation. R partial stand pivot w/c>EOM with therapist manually facilitating R LE positioning and min assist for pivoting hips. Pt already wearing R LE [place] Walk-on PLS AFO.  Pt is demonstrating bias into R hip external rotation while lying in bed and sitting in w/c - needs positioning assist to avoid prolonged placement in ER.   Sit<>stands to/from EOM with mirror feedback and L foot placed on 4" step to promote R weight shift to force increased R LE WBing for NMR to activate R hip/knee extensors - 2x10 reps with some mini-squats in between - therapist providing mod assist for balance without L UE support and therapist manually facilitating R knee extension and blocking buckle.   Retrieved +2 for L HHA and performed R LE NMR targeting stance control with mirror feedback including:  - pre-gait training of stepping L LE forward/backwards, continues to require mod assist for balance and blocking R knee buckle  - progressed to L  foot taps on/off 4" step, requires heavier mod assist for balance and heavier assist to block R knee buckle during stance - will intermittently have anterior LOB when R knee moves back into full extension quickly causing his pelvis to jut forward  Notice pt has minor activation in R glutes and limited in R quads - kicks in most when stepping L LE backwards but greatest difficulty is with achieving terminal knee extension  Gait training 33ft x3 using +2 L HHA  with min assist from +2 for balance and HH support then mod assist from therapist for R LE management and balance. Pt demonstrating the following gait deviations with therapist providing the described cuing and facilitation for improvement:  - donned R shoe toe cover for improved swing phase advancement - continues to only require min guarding of R knee and tactile cuing on R quads during stance to prevent buckle, even though pt keeps R knee partially flexed during stance (will sometimes snap back into full extension at mid to terminal stance causing quick forward movement of his body)  - able to advance R LE during swing at least 25% of the way consistently and then occasionally 50-75% of the way requiring consistent mod/max assist to reach positive step length for reciprocal step pattern with slight circumduction compensation due to lack of sufficient hip/knee flexion, decreased vaulting on L toes to compensate for poor R foot clearance noted today - continues to benefit from facilitation for weigh shifting onto stance limbs bilaterally  Transported back  to his room and pt agreeable to remain up in w/c - left with needs in reach and seat belt alarm on.  Session 2: Pt received sitting in w/c and agreeable to therapy session despite reporting fatigue. Per nursing staff pt's blood sugar was low but he ate a snack and had juice so was cleared to participate in light therapy until re-assessment. Transported pt to therapy gym. Performed L hemi-technique  wheelchair propulsion training with therapist educating on use of LE to control direction/stirring - pt performs frequent, small push-strokes using UE and LE resulting in pt fatiguing quickly after ~37ft requiring rest breaks. Pt requesting additional snack, after approval with nurse provided pt with yogurt. Encouraged R UE NMR via having pt hold yogurt cup with R hand while controlling spoon with L UE - therapist having to hover guard R hand to prevent pt from dropping/spilling. Nurse reassessed BS at 75, so pt cleared to participate in more rigorous therapy. Partial stand pivot w/c<>Nustep with min assist for pivoting hips after therapist manually facilitates R foot placement - pt continues to have significant compensatory transfer technique with primary WBing through L LE. Performed B LE reciprocal stepping pattern retraining on Nustep against level 1 resistance for R LE NMR for 3sets (69min20sec totaling 120steps + 57min18sec totaling 34steps + totaling 47steps (30sec rest breaks between sets and pt requiring a few short rest breaks during each set) - therapist manually facilitating improved R LE alignment as pt rests in excessive hip external rotation due to hip adductor and internal rotation weakness - pt requires frequent manual facilitation to assist with initiating R hip/knee extension to push pedal away. Pt performed L hemi-propulsion back to his room with supervision as described above. Transfer to bed min A. Sit>supine with pt able to actually lift R LE up onto bed without assistance when given increased time!! Therapist set-up pt's meal tray and had pt assist with opening containers via holding them in his R hand from NMR. Pt left supported upright in bed with needs in reach and bed alarm on.  Therapy Documentation Precautions:  Precautions Precautions: Fall Precaution Comments: right sided hemiparesis, expressive deficits Restrictions Weight Bearing Restrictions: No   Pain:  Session 1:  No reports of pain throughout session.  Session 2: No reports of pain throughout session.    Therapy/Group: Individual Therapy  Ginny Forth , PT, DPT, NCS, CSRS 08/17/2022, 7:55 AM

## 2022-08-18 LAB — BASIC METABOLIC PANEL
Anion gap: 13 (ref 5–15)
BUN: 114 mg/dL — ABNORMAL HIGH (ref 6–20)
CO2: 22 mmol/L (ref 22–32)
Calcium: 9.6 mg/dL (ref 8.9–10.3)
Chloride: 102 mmol/L (ref 98–111)
Creatinine, Ser: 4.32 mg/dL — ABNORMAL HIGH (ref 0.61–1.24)
GFR, Estimated: 16 mL/min — ABNORMAL LOW (ref >60)
Glucose, Bld: 106 mg/dL — ABNORMAL HIGH (ref 70–99)
Potassium: 4.8 mmol/L (ref 3.5–5.1)
Sodium: 137 mmol/L (ref 135–145)

## 2022-08-18 LAB — CBC
HCT: 41.1 % (ref 39.0–52.0)
Hemoglobin: 13.5 g/dL (ref 13.0–17.0)
MCH: 30.8 pg (ref 26.0–34.0)
MCHC: 32.8 g/dL (ref 30.0–36.0)
MCV: 93.8 fL (ref 80.0–100.0)
Platelets: 285 10*3/uL (ref 150–400)
RBC: 4.38 MIL/uL (ref 4.22–5.81)
RDW: 12.7 % (ref 11.5–15.5)
WBC: 6.5 10*3/uL (ref 4.0–10.5)
nRBC: 0 % (ref 0.0–0.2)

## 2022-08-18 LAB — IRON AND TIBC
Iron: 41 ug/dL — ABNORMAL LOW (ref 45–182)
Saturation Ratios: 15 % — ABNORMAL LOW (ref 17.9–39.5)
TIBC: 274 ug/dL (ref 250–450)
UIBC: 233 ug/dL

## 2022-08-18 LAB — GLUCOSE, CAPILLARY
Glucose-Capillary: 185 mg/dL — ABNORMAL HIGH (ref 70–99)
Glucose-Capillary: 100 mg/dL — ABNORMAL HIGH (ref 70–99)

## 2022-08-18 LAB — FERRITIN: Ferritin: 100 ng/mL (ref 24–336)

## 2022-08-18 NOTE — Progress Notes (Signed)
Physical Therapy Session Note  Patient Details  Name: Joseph Bright MRN: [id number] Date of Birth: [birth year]  Today's Date: 08/18/2022 PT Individual Time: 1300-1345 PT Individual Time Calculation (min): 45 min   Short Term Goals: Week 2:  PT Short Term Goal 1 (Week 2): Pt will perform supine<>sit with CGA PT Short Term Goal 1 - Progress (Week 2): Progressing toward goal PT Short Term Goal 2 (Week 2): Pt will perform sit<>stands using LRAD with min assist PT Short Term Goal 2 - Progress (Week 2): Met PT Short Term Goal 3 (Week 2): Pt will perform bed<>chair transfers using LRAD with min assist PT Short Term Goal 3 - Progress (Week 2): Met PT Short Term Goal 4 (Week 2): Pt will ambulate at least 34ft using LRAD with mod assist of 1 and +2 assist as needed PT Short Term Goal 4 - Progress (Week 2): Met PT Short Term Goal 5 (Week 2): Pt will navigate 4 stairs using B HRs with mod assist of 1 and +2 for safety as needed PT Short Term Goal 5 - Progress (Week 2): Met  Skilled Therapeutic Interventions/Progress Updates: Pt presents siting in w/c and agreeable to therapy.  Pt states wanting to walk.  Pt wheeled to main gym for time conservation.  Pt transfers multiple trials from w/c w/ min A and verbal cues for l hand placement.  Pt able to place R hand w/ min A in walker aide.  Pt amb x 32' w/ RW and max A for advancing RLE and placing.  Pt requires verbal cues for wt shift to L for RLE advancement.  Pt tends to piston on L in attempt to advance RLE but still requires max A for placement.  Pt sat EOM and performed multiple blocks of sit to stand transfers.  Pt amb back to w/c x 32' w/ max A for advancing RLE. Pt performed standing advancing B feet using back of chair on L to increase WB to RLE.  Pt R LE ER unless positioned for stepping.  Pt returned to room and remained sitting in w/c w/ all needs in reach.     Therapy Documentation Precautions:  Precautions Precautions: Fall Precaution  Comments: right sided hemiparesis, expressive deficits Restrictions Weight Bearing Restrictions: No General:   Vital Signs:   Pain:0/10 Pain Assessment Pain Scale: 0-10 Pain Score: 0-No pain    Therapy/Group: Individual Therapy  Lucio Edward 08/18/2022, 1:47 PM

## 2022-08-18 NOTE — Progress Notes (Addendum)
Nutrition Quick Note:   MD consult for snacks in-between meals. The pt reports that he has been eating well and has a great appetite. RD will add snacks. Pt states that he does not like the Nepro shakes. RD will discontinue supplements. No further nutrition interventions warranted.   Bethann Humble, RD, LDN, CNSC.

## 2022-08-18 NOTE — Progress Notes (Addendum)
PROGRESS NOTE   Subjective/Complaints:  Appreciate nephro notes , labs reviewed  Seen with OT, RUE strength and function much improved vs baseline   ROS: Patient denies CP, SOB N/V/D.      Objective:   No results found. Recent Labs    08/18/22 0528  WBC 6.5  HGB 13.5  HCT 41.1  PLT 285      Recent Labs    08/17/22 0945 08/18/22 0528  NA 138 137  K 5.0 4.8  CL 101 102  CO2 23 22  GLUCOSE 75 106*  BUN 107* 114*  CREATININE 4.32* 4.32*  CALCIUM 10.0 9.6      Intake/Output Summary (Last 24 hours) at 08/18/2022 0844 Last data filed at 08/18/2022 0604 Gross per 24 hour  Intake 798 ml  Output 800 ml  Net -2 ml         Physical Exam: Vital Signs Blood pressure 110/80, pulse 68, temperature 98.4 F (36.9 C), temperature source Oral, resp. rate 20, height 5\' 9"  (1.753 m), weight 99.3 kg, SpO2 100 %.    General: No acute distress Mood and affect are appropriate Heart: Regular rate and rhythm no rubs murmurs or extra sounds Lungs: Clear to auscultation, breathing unlabored, no rales or wheezes Abdomen: Positive bowel sounds, soft nontender to palpation, nondistended Extremities: No clubbing, cyanosis, or edema Skin: No evidence of breakdown, no evidence of rash  Skin: No evidence of breakdown, no evidence of rash, calluses on knees  Neurologic:Alert and oriented x 3. Normal insight and awareness. Intact Memory. Normal language and speech. Cranial nerve exam unremarkable. MMT: Left-sided strength is normal RUE 4- prox to distal. RLE 0/5 HF, KE, KF and 0/5 distally. Sensory exam normal sensation to light touch and proprioception in bilateral upper and lower extremities Cerebellar exam unable to do on right LE due to weakness , intact RUE FNF Musculoskeletal: Full range of motion in all 4 extremities. No joint swelling     Assessment/Plan: 1. Functional deficits which require 3+ hours per day of  interdisciplinary therapy in a comprehensive inpatient rehab setting. Physiatrist is providing close team supervision and 24 hour management of active medical problems listed below. Physiatrist and rehab team continue to assess barriers to discharge/monitor patient progress toward functional and medical goals  Care Tool:  Bathing    Body parts bathed by patient: Abdomen, Chest   Body parts bathed by helper: Right arm, Left arm, Front perineal area, Buttocks, Right upper leg, Left upper leg, Right lower leg, Left lower leg     Bathing assist Assist Level: Total Assistance - Patient < 25%     Upper Body Dressing/Undressing Upper body dressing   What is the patient wearing?: Pull over shirt    Upper body assist Assist Level: Maximal Assistance - Patient 25 - 49%    Lower Body Dressing/Undressing Lower body dressing      What is the patient wearing?: Pants, Incontinence brief     Lower body assist Assist for lower body dressing: Maximal Assistance - Patient 25 - 49%     Toileting Toileting    Toileting assist Assist for toileting: Moderate Assistance - Patient 50 - 74%  Transfers Chair/bed transfer  Transfers assist  Chair/bed transfer activity did not occur: N/A  Chair/bed transfer assist level: Minimal Assistance - Patient > 75%     Locomotion Ambulation   Ambulation assist      Assist level: 2 helpers (+2 mod A) Assistive device: Hand held assist (B HHA) Max distance: 35ft x3   Walk 10 feet activity   Assist  Walk 10 feet activity did not occur: Safety/medical concerns  Assist level: 2 helpers Assistive device: Hand held assist   Walk 50 feet activity   Assist Walk 50 feet with 2 turns activity did not occur: Safety/medical concerns  Assist level: 2 helpers Assistive device: Hand held assist    Walk 150 feet activity   Assist Walk 150 feet activity did not occur: Safety/medical concerns         Walk 10 feet on uneven surface   activity   Assist Walk 10 feet on uneven surfaces activity did not occur: Safety/medical concerns         Wheelchair     Assist Is the patient using a wheelchair?: Yes Type of Wheelchair: Manual    Wheelchair assist level: Supervision/Verbal cueing Max wheelchair distance: 170ft    Wheelchair 50 feet with 2 turns activity    Assist        Assist Level: Supervision/Verbal cueing   Wheelchair 150 feet activity     Assist      Assist Level: Supervision/Verbal cueing   Blood pressure 110/80, pulse 68, temperature 98.4 F (36.9 C), temperature source Oral, resp. rate 20, height 5\' 9"  (1.753 m), weight 99.3 kg, SpO2 100 %.  Medical Problem List and Plan: 1. 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  Antithrombotics: -DVT/anticoagulation:  Pharmaceutical: Heparin 5000U q8h- amb HHA 120' may d/c heparin              -antiplatelet therapy: Aspirin 81 mg daily and Brilinta 90 mg twice daily 3. Pain Management: Tylenol as needed 4. Mood/Behavior/Sleep: Provide emotional support             -antipsychotic agents: N/A 5. Neuropsych/cognition: This patient is not capable of making decisions on his own behalf. 6. Skin/Wound Care: Routine skin checks 7. Fluids/Electrolytes/Nutrition: Routine in and outs with follow-up chemistries D/c CBG  CBG (last 3)  Recent Labs    08/17/22 1629 08/17/22 1657 08/18/22 0723  GLUCAP 62* 75 185*     8.  Seizure prophylaxis.  EEG negative.  Continue Keppra 500 mg daily 9.  AKI on CKD stage IV.  Follow-up chemistries.  Follow-up outpatient nephrology services             -pt given lokelma prior to rehab admit             Nephro input appreciated  5/24  -recheck bmet again tomorrow  -will hold chlorthalidone for now  -check weight, I/O's fairly balanced    Latest Ref Rng & Units 08/18/2022    5:28 AM 08/17/2022    9:45 AM 08/16/2022    7:50 AM  BMP  Glucose 70 -  99 mg/dL 478  75  99   BUN 6 - 20 mg/dL 295  621  308   Creatinine 0.61 - 1.24 mg/dL 6.57  8.46  9.62   Sodium 135 - 145 mmol/L 137  138  136   Potassium 3.5 - 5.1 mmol/L 4.8  5.0  4.4   Chloride 98 - 111 mmol/L 102  101  103   CO2 22 - 32 mmol/L 22  23  22    Calcium 8.9 - 10.3 mg/dL 9.6  95.2  9.5      10.  Hyperlipidemia.  Crestor 20mg  QD 11.  Permissive hypertension.  Patient on amlodipine 10 mg daily, hydralazine 50 mg twice daily, labetalol 200 mg twice daily prior to admission.  Resume as needed  -5/24 Vitals:   08/13/22 2005 08/14/22 0501 08/14/22 1348 08/15/22 0614  BP: 134/81 121/73 (!) 124/93 114/85   08/15/22 1432 08/15/22 2049 08/16/22 0601 08/16/22 1459  BP: 134/87 123/85 98/74 113/85   08/16/22 2116 08/17/22 0518 08/17/22 1319 08/18/22 0555  BP: (!) 99/42 123/75 116/76 110/80    12.  Medical noncompliance.  Provide counseling 13. Constipation/incontinence of bowel:  Cont BM 5/22  LOS: 17 days A FACE TO FACE EVALUATION WAS PERFORMED  Erick Colace 08/18/2022, 8:44 AM

## 2022-08-18 NOTE — Progress Notes (Signed)
Speech Language Pathology Daily Session Note  Patient Details  Name: Joseph Bright MRN: [id number] Date of Birth: [birth year]  Today's Date: 08/18/2022 SLP Individual Time: 0900-1000 SLP Individual Time Calculation (min): 60 min  Short Term Goals: Week 3: SLP Short Term Goal 1 (Week 3): Pt will utilize compensatory word-finding strategies at the sentence level to repair communication breakdowns with 75% accuracy given  min cues. SLP Short Term Goal 2 (Week 3): Pt will verbally respond to open ended wh questions with 80% acc given min A SLP Short Term Goal 3 (Week 3): Pt will produce 5-6 word sentences with 75% acc given min verbal cues SLP Short Term Goal 4 (Week 3): Pt will use written communication at the word level to repair communication breakdowns with 75% acc given min A  Skilled Therapeutic Interventions:  Pt was seen in am to address expressive language. Pt was alert and seated upright in WC upon SLP arrival. Pt denied pain and was agreeable for session. SLP challenged pt in functional communication with goal of utilizing compensatory strategies to repair communication break downs. SLP reviewed strategies of multimodal communication and word finding strategies. Pt required mod A during communication break downs with inconsistent success. Increased difficulty noted this date with writing with pt reporting difficulty with spelling as opposed to previous session. In additional minutes of session CLP challenged pt in producing 5-6 word sentences given a picture stimulus. Pt completed task with 70% acc with min A. He responded to open ended wh questions given abstract thoughts with 75% acc with min A. In missed opportunities, SLP provided a model of appropriate response. Pt was left seated upright in WC with call button within reach. SLP to continue POC.   Pain Pain Assessment Pain Scale: 0-10 Pain Score: 0-No pain  Therapy/Group: Individual Therapy  Renaee Munda 08/18/2022, 4:00 PM

## 2022-08-18 NOTE — Progress Notes (Signed)
Occupational Therapy Session Note  Patient Details  Name: Joseph Bright MRN: [id number] Date of Birth: [birth year]  Today's Date: 08/18/2022 OT Individual Time: 0800-0900 OT Individual Time Calculation (min): 60 min  OT Individual Time: 1116-1200 OT Individual Time Calculation (min):  44 min   Short Term Goals: Week 3:  OT Short Term Goal 1 (Week 3): Pt will utilize RUE during functional tasks 50% of time with supervision OT Short Term Goal 2 (Week 3): Pt wil complete squat pivot toilet transfers min A consistently using LRAD OT Short Term Goal 3 (Week 3): Pt will complete sit>stands CGA consistently  Skilled Therapeutic Interventions/Progress Updates:     AM Session:  Pt received semi-reclined in bed presenting to be in good spirits receptive to skilled OT session reporting 0/10 pain- OT offering intermittent rest breaks, repositioning, and therapeutic support to optimize participation in therapy session.   Pt transitioned to EOB using taught modified technique of hooking LLE around RLE to bring legs to EOB with min VB cues provided for technique and motivation. Pt able to lift trunk using bed features close supervision. Sitting EOB Pt donned shirt with set-up assist with decreased time required this session to utilize hemi-dressing technique. Pt able to weave BLEs into pants using hemi-dressing technique- increased time and mod VB cues required for problem solving to use LLE to lift RLE off ground. Socks, shoes, and AFO donned total A for time management.   Stand pivot transfer EOB>WC min A for balance and RLE stabilization/positioning.   Discussed importance of using RUE throughout the day during functional task to optimize functional return and outcomes as pt continues to try to move his RUE using LUE or compensate for decreased strength/control by using LUE. Pt verbalizing understanding.   Pt completed oral hygiene and grooming tasks seated at sink using self HOH technique grasping  toiletry items with his RUE and providing HOH using LUE when needed to maintain grasp or provide adequate pressure. Mod VB cueing required for motivation and technique with Pt able to complete at overall supervision level.   Pt transported total A to therapy gym in wc for time management and energy conservation.   Engaged Pt in standing functional reaching activities to facilitate active RLE engagement/weight bearing and improved RUE functional use. Positioned LLE on 2" step to promote weight shifting to R side for improved activation of R knee extensors in standing position. Pt able to tolerate standing ~3 minutes x4 trials with OT providing RLE stabilization to facilitate extension and prevent knee from buckling and min A for balance with LUE positioned on table for support. Worked on active grasp/release and maintained grasp stacking textured cones and transporting from one side of the table to the other. Pt able to complete 2 trials without dropping with mod VB cueing required for technique, awareness, grasp maintenance, and to grasp entire hand around cone. Graded up standing activity to incorporate FM three-jaw-chuck grasp, VM coordination, and functional reaching with pt instructed to donn/doff yellow and red (low resistance) clothes pins onto vertical bar. Pt able to complete with min dropping and increased time for completing in hand manipulation to position clothes pins. Mod VB cues provided for technique and facilitate maintained pinch during task.   Pt transported back to room total A in wc. Pt was left resting in wc with call bell in reach and all needs met. Pt requesting to use restroom following session with nursing staff informed and entering room upon OT departure.  PM Session:  Pt  received sitting up in wc presenting to be in good spirits receptive to skilled OT session reporting 0/10 pain- OT offering intermittent rest breaks, repositioning, and therapeutic support to optimize  participation in therapy session. Pt transported total A to therapy gym in wc for time management and energy conservation.   Partial stand pivot wc>EOM with min A and OT facilitating RLE positioning and blocking R knee to prevent buckling. Engaged Pt in seated UB exercises using 4# weighted bar to increase overall strength, motor control, functional use for BADLs, improved reciprocal arm movements, and provide increased proprioceptive feedback to RUE.  Pt requiring increased time for rest breaks in-between sets and min tactile cues for muscle activation/technique. Mod VB cues required to attend to RUE and maintain grasp on dowel. Pt completed 3x10 reps of the following exercises: -Biceps curls -Overhead press -Anterior shoulder flexion -Anterior Chest Press  Partial stand pivot EOM>wc to L side with OT facilitating RLE positioning and blocking R knee to prevent blocking. Transported Pt back to room total A in wc. Pt was left resting in wc  with call bell in reach and all needs met.    Therapy Documentation Precautions:  Precautions Precautions: Fall Precaution Comments: right sided hemiparesis, expressive deficits Restrictions Weight Bearing Restrictions: No   Therapy/Group: Individual Therapy  Army Fossa 08/18/2022, 7:42 AM

## 2022-08-18 NOTE — Progress Notes (Signed)
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 Since medical stabilization on 5/8 patient has been undergoing rehabilitation in CIR here at [facility]. Patient did have some loose bowel movements on 5/12 and has been intermittently incontinent of bowels over the last week. Per chart review did have two large BM day before consultation. He has been eating and drinking normally, no issues with urination, leg swelling, cough, dyspnea.   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 Baseline appears to be closer to 3.0-3.2. There was a minor increase in creatinine 5/10 when anti-hypertensives switched to chlorthalidone. 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 He is urinating well, suspect this AKI is due to contrast nephropathy with multiple recent CT scans; contrast CT angio head 4/30 (75ml), lysis 4/30 with 80mL of contrast given, and then a stent on 5/13 80mL for a IC stent.   - No acute changes, continues to be asymptomatic, eating and drinking well with good urine output. No emergent needs for RRT/HD. 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 - Continue Renvela for hyperphosphatemia 2/2 chronic renal disease - Low vitamin D level, pending PTH.  - Would like to hold dialysis as long as possible to give him a chance to recover some renal function. High likelihood of being ESRD if we start dialysis given his advanced CKD4. He understands this and agrees.   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   Subjective:   Feels well, continuing to do well with therapy. No dyspnea, cough, abdominal pain, n/v/d, difficulty  with urination/decreased urine.    Objective:   BP 110/80 (BP Location: Left Arm)   Pulse 68   Temp 98.4 F (36.9 C) (Oral)   Resp 20   Ht 5\' 9"  (1.753 m)   Wt 99.3 kg   SpO2 100%   BMI 32.34 kg/m   Intake/Output Summary (Last 24 hours) at 08/18/2022 0814 Last data filed at 08/18/2022 0604 Gross per 24 hour  Intake 798 ml  Output 800 ml  Net -2 ml    Weight change:   Physical Exam: General: Resting in bed in no acute distress HEENT: NCAT, moist mucous membranes Neck: Supple, no JVD Heart: Regular rate, rhythm. No murmurs. Warm extremities.  Lungs: Clear to auscultation bilaterally, no rales.  Abdomen: Soft, non-tender, non-distended. Normoactive bowel sounds.  Extremities: No peripheral edema noted Skin: No rashes or lesions. Neuro: Awake, alert, conversing appropriately.   Imaging: No results found.  Labs: BMET Recent Labs  Lab 08/14/22 0538 08/15/22 0601 08/16/22 0750 08/17/22 0945 08/18/22 0528  NA 136 137 136 138 137  K 4.7 5.1 4.4 5.0 4.8  CL 101 102 103 101 102  CO2 20* 23 22 23 22   GLUCOSE 95 101* 99 75 106*  BUN 97* 104* 106* 107* 114*  CREATININE 4.10* 4.12* 4.33* 4.32* 4.32*  CALCIUM 9.6 9.6 9.5 10.0 9.6  PHOS  --   --  6.2* 5.5*  --     CBC Recent Labs  Lab 08/14/22 0538 08/18/22 0528  WBC 7.1 6.5  HGB 12.8* 13.5  HCT 39.0 41.1  MCV 94.0 93.8  PLT 295 285  Medications:     aspirin  81 mg Oral Daily   docusate sodium  100 mg Oral Daily   feeding supplement (NEPRO CARB STEADY)  237 mL Oral BID BM   levETIRAcetam  500 mg Oral Q2200   polyethylene glycol  17 g Oral Daily   rosuvastatin  20 mg Oral Daily   sevelamer carbonate  800 mg Oral TID WC   sodium bicarbonate  1,300 mg Oral BID   ticagrelor  90 mg Oral BID   Evlyn Kanner, MD 08/18/22,  8:14 AM

## 2022-08-19 LAB — GLUCOSE, CAPILLARY: Glucose-Capillary: 98 mg/dL (ref 70–99)

## 2022-08-19 LAB — PARATHYROID HORMONE, INTACT (NO CA): PTH: 72 pg/mL — ABNORMAL HIGH (ref 15–65)

## 2022-08-19 NOTE — Progress Notes (Signed)
Pt placed on cpap for the night. °

## 2022-08-19 NOTE — Progress Notes (Signed)
Physical Therapy Session Note  Patient Details  Name: Joseph Bright MRN: [id number] Date of Birth: 09/08/1975  Today's Date: 08/19/2022 PT Individual Time: 0807-0917 PT Individual Time Calculation (min): 70 min   Short Term Goals: Week 3:  PT Short Term Goal 1 (Week 3): Pt will perform supine<>sit with CGA consistently PT Short Term Goal 2 (Week 3): Pt will perform sit<>stands using LRAD with CGA PT Short Term Goal 3 (Week 3): Pt will perform bed<>chair transfers using LRAD with CGA PT Short Term Goal 4 (Week 3): Pt will ambulate at least 64ft in controlled environment using LRAD with min assist of 1 and +2 for safety as needed PT Short Term Goal 5 (Week 3): Pt will navigate 4 steps using HRs with min assist of 1 and +2 for safety as needed  Skilled Therapeutic Interventions/Progress Updates:    Pt received supine in bed awake and eager to participate in therapy session. Supine>sitting R EOB, HOB flat but relying heavily on bedrails via L UE support to bring trunk upright and pivot his pelvis - uses L LE to assist with R LE management off EOB.   Sitting EOB with close supervision and x2 min assist for trunk control due to pt's R LE sliding out from a supportive position causing R anterior LOB - while threading on shorts with increased time but no hands-on assist and then donning socks, shoes and R LE [place] walk-on PLS AFO total assist.   Sit>stand EOB>no UE support with min assist for balance while pulling pants up over hips with min assist for R side of pants. L stand pivot transfer to w/c with UE support on therapist and min assist for balance and manual facilitation for R LE stepping, but transitioning to this technique to promote increased R LE WBing rather than using compensatory partial stand pivot with only L LE WBing.  Sitting in w/c performed oral care with min cuing for increased incorporation of R UE without hands-on assist.  Transported to/from gym in w/c for time management and  energy conservation.  R stand pivot w/c>EOM with min assist for balance and for manually facilitating R LE lateral stepping to promote increased R LE WBing.  Sit<>stands to/from EOM with mirror feedback focusing on midline orientation and not biasing compensating with L LE WBing x5 reps with therapist providing manual facilitation for increased R hip/knee extension and R weight shift.  Sit>supine on mat with min assist for R LE management up onto mat.  Supine R LE NMR including:  - active assisted R LE hip/knee flexion to bring R knee to chest due to pt lacking adequate R hip flexion activation nor R hamstring activation  x5-8 reps - bridging with R LE bias, L LE partially extended, to bias R LE Wbing - requires min/mod manual facilitation to prevent excessive R hip external rotation with abduction 2x 8-10 reps  - bridging with adductor ball squeeze x5 reps, continues to have R pelvis rotation with R LE abducted/externally rotated  Gait training 129ft + 137ft +27ft using +2  L HHA with +2 min/mod assist for balance and for R LE advancement during swing. Pt demonstrating the following gait deviations with therapist providing the described cuing and facilitation for improvement:  - shoe cover used as R LE toe cap to improve pt's ability to advance during swing - mod/max A to advance R LE during swing  - pt attempting to vault on L LE to improve R foot clearance during swing, but with cuing  able to stop  - only min guarding of R LE during stance, R knee continues to stay partially flexed during stance as pt will otherwise snap fully back into R knee extension causing quick forward movement of his body and fast L step through   - pt with significant improvement in symmetry of stance phases and improved fluidity of gait during 3rd gait trial!! - continued cuing for weight shifting onto stance limb  Transported back to room and pt left seated in w/c with needs in reach and in care of NT.   Therapy  Documentation Precautions:  Precautions Precautions: Fall Precaution Comments: right sided hemiparesis, expressive deficits Restrictions Weight Bearing Restrictions: No   Pain:   Denies pain during session.    Therapy/Group: Individual Therapy  Ginny Forth , PT, DPT, NCS, CSRS 08/19/2022, 7:47 AM

## 2022-08-19 NOTE — Progress Notes (Signed)
Speech Language Pathology Daily Session Note  Patient Details  Name: Joseph Bright MRN: [id number] Date of Birth: 11/29/1975  Today's Date: 08/19/2022 SLP Individual Time: 0230-0330 SLP Individual Time Calculation (min): 60 min  Short Term Goals: Week 3: SLP Short Term Goal 1 (Week 3): Pt will utilize compensatory word-finding strategies at the sentence level to repair communication breakdowns with 75% accuracy given  min cues. SLP Short Term Goal 2 (Week 3): Pt will verbally respond to open ended wh questions with 80% acc given min A SLP Short Term Goal 3 (Week 3): Pt will produce 5-6 word sentences with 75% acc given min verbal cues SLP Short Term Goal 4 (Week 3): Pt will use written communication at the word level to repair communication breakdowns with 75% acc given min A  Skilled Therapeutic Interventions:  Pt was seen in PM to address expressive language. Pt was alert and seat upright in WC upon SLP arrival. Pt's family present and participating intermittently throughout session. SLP challenged pt in production of 5-6 word sentences utilizing the language activity resource kit [place]). Pt required min A including syllabic cues and semantic cues for 83% acc. Pt responded to functional and complex wh questions with 90% acc with sup A. Education completed to family on use of circumlocution, substitution, and written communication strategies. In additional minutes of session, SLP challenged pt in functional communication with focus on placing an order at a restaurant. Pt completed task with 80% acc with min A. Pt left seated upright in [facility] with call button within reach and family member present. SLP to continue POC.   Pain Pain Assessment Pain Scale: 0-10 Pain Score: 0-No pain  Therapy/Group: Individual Therapy  Renaee Munda 08/19/2022, 3:28 PM

## 2022-08-20 NOTE — Progress Notes (Signed)
Occupational Therapy Session Note  Patient Details  Name: Arthuro Noetzel MRN: [id number] Date of Birth: 08-02-1975  Today's Date: 08/20/2022 OT Individual Time: 1005-1100 OT Individual Time Calculation (min): 55 min    Short Term Goals: Week 1:  OT Short Term Goal 1 (Week 1): PT will transfer to the toilet/ BSC with mod A in both directions OT Short Term Goal 1 - Progress (Week 1): Met OT Short Term Goal 2 (Week 1): Pt will don shirt wtih mod A  unsupported. OT Short Term Goal 2 - Progress (Week 1): Met OT Short Term Goal 3 (Week 1): Pt will perform bed mobility to come to EOB in prep for ADLs- with mod A consistency OT Short Term Goal 3 - Progress (Week 1): Met OT Short Term Goal 4 (Week 1): Pt will perform sit to stands with min A consistency for clothing management. OT Short Term Goal 4 - Progress (Week 1): Met Week 2:  OT Short Term Goal 1 (Week 2): Pt will recall hemidressing technique with min questioning cus consistently OT Short Term Goal 1 - Progress (Week 2): Met OT Short Term Goal 2 (Week 2): Pt will complete squat pivot transfers in both directions with min A OT Short Term Goal 2 - Progress (Week 2): Met OT Short Term Goal 3 (Week 2): Pt will maintain standing balance with min A consistently for BADLs LRAD PRN OT Short Term Goal 3 - Progress (Week 2): Met OT Short Term Goal 4 (Week 2): Pt will don pants mod A consistently OT Short Term Goal 4 - Progress (Week 2): Met Week 3:  OT Short Term Goal 1 (Week 3): Pt will utilize RUE during functional tasks 50% of time with supervision OT Short Term Goal 2 (Week 3): Pt wil complete squat pivot toilet transfers min A consistently using LRAD OT Short Term Goal 3 (Week 3): Pt will complete sit>stands CGA consistently  Skilled Therapeutic Interventions/Progress Updates:    1:1 Self care retraining at shower level . From a flat bed without the bed rail pt able to come to the EOB with min A off the right side of the bed with more than  reasonable time. Pt performed squat pivots with min A from EOB<w/c<toilet<w/c <shower bench<w/c. Pt did require A to pull down clothing for toileting but was able to perform hygiene in seated position. Discussed bathroom setup for home with pt and pt's girlfriend. Pt's girfriend reports he will be going to the new apartment that is entry level on the first floor when he leaves here and will have grab bars around the toilet. Pt initiated using right Ue in the shower to bathe without being cued but did require cues to functionally use in tooth brushing. Pt performed sit to stands in dressing without UE support with min A with noted quad contraction and hip extension but difficulty with sustaining it. Pt able to retrieve clothing and need items on the right with right UE with environmental cues to prompt use. AFO, socks and shoes donned with total A.   Pt able to maneuver around room at w/c level with min cues for awareness around him and to use left UE and LE.  Left sitting up in w/c with call bell and girlfriend in the room.   Therapy Documentation Precautions:  Precautions Precautions: Fall Precaution Comments: right sided hemiparesis, expressive deficits Restrictions Weight Bearing Restrictions: No  Pain: No c/o pain in session    Therapy/Group: Individual Therapy  Roney Mans [facility] 08/20/2022,  12:31 PM

## 2022-08-20 NOTE — Progress Notes (Signed)
Physical Therapy Session Note  Patient Details  Name: Joseph Bright MRN: [id number] Date of Birth: 09-07-1975  Today's Date: 08/20/2022 PT Individual Time: 1245-1345 PT Individual Time Calculation (min): 60 min   Short Term Goals: Week 3:  PT Short Term Goal 1 (Week 3): Pt will perform supine<>sit with CGA consistently PT Short Term Goal 2 (Week 3): Pt will perform sit<>stands using LRAD with CGA PT Short Term Goal 3 (Week 3): Pt will perform bed<>chair transfers using LRAD with CGA PT Short Term Goal 4 (Week 3): Pt will ambulate at least 51ft in controlled environment using LRAD with min assist of 1 and +2 for safety as needed PT Short Term Goal 5 (Week 3): Pt will navigate 4 steps using HRs with min assist of 1 and +2 for safety as needed  Skilled Therapeutic Interventions/Progress Updates:    Chart reviewed and pt agreeable to therapy. Pt received seated in WC with no c/o pain. Session focused on functional transfers and NMR to promote recovery of RLE motor function needed for ambulatory mobility. Pt taken to therapy gym for time management. Pt initiated session with 5 mins marching on Kinetron at light resistance. Pt then transferred via SPT to NuStep using MinA + VC for safe foot alignment. Pt then completed series of BUE and then BLE practices on workload 1-3. For BUE, pt completed 30sec practices of BUE only at workload 3 with strong verbal and tactile cues for RUE gripping and push/pull facilitation.  Pt also completed series of 30sec practices of BLE only at workload 1 on NuStep with emphasis on medial/lateral R knee control. Pt then completed SPT to [facility] with same assist level and improved BLE positioning for safety, and then pt returned to room via WC to continue session. Pt then completed standing exercises in STEDY that included glute squeezes and quad squeezes to promote functional return of muscle activation. Pt noted to have improved activation with practice on individual R glute and  then R quad muscles. Pt then practiced activation of both R glue and R quad at same time with AA and pt holding position for 5-10 secs. Pt then progressed to practice light weight shifting for 5x2 reps while holding R glute and R quad squeeze in standing and AA to maintain position. At end of session, pt was left seated in [facility] with nurse call bell and all needs in reach. Pt reported that he no longer used lap belt alarm. PT confirmed RN approval for pt to not use lap belt alarm 2/2 good pt compliancy to fall prevention t/o admission and no evidence of impulsive behavior.      Therapy Documentation Precautions:  Precautions Precautions: Fall Precaution Comments: right sided hemiparesis, expressive deficits Restrictions Weight Bearing Restrictions: No General:      Therapy/Group: Individual Therapy  Dionne Milo, PT, DPT 08/20/2022, 2:04 PM

## 2022-08-20 NOTE — Progress Notes (Signed)
PROGRESS NOTE   Subjective/Complaints:  Had a birthday yesterday , no c/os this am , aware of therapy schedule and times   ROS: Patient denies CP, SOB N/V/D.      Objective:   No results found. Recent Labs    08/18/22 0528  WBC 6.5  HGB 13.5  HCT 41.1  PLT 285      Recent Labs    08/17/22 0945 08/18/22 0528  NA 138 137  K 5.0 4.8  CL 101 102  CO2 23 22  GLUCOSE 75 106*  BUN 107* 114*  CREATININE 4.32* 4.32*  CALCIUM 10.0 9.6      Intake/Output Summary (Last 24 hours) at 08/20/2022 0934 Last data filed at 08/20/2022 0735 Gross per 24 hour  Intake 960 ml  Output 175 ml  Net 785 ml         Physical Exam: Vital Signs Blood pressure 112/66, pulse 73, temperature 98.2 F (36.8 C), temperature source Oral, resp. rate 20, height 5\' 9"  (1.753 m), weight 99.3 kg, SpO2 98 %.    General: No acute distress Mood and affect are appropriate Heart: Regular rate and rhythm no rubs murmurs or extra sounds Lungs: Clear to auscultation, breathing unlabored, no rales or wheezes Abdomen: Positive bowel sounds, soft nontender to palpation, nondistended Extremities: No clubbing, cyanosis, or edema Skin: No evidence of breakdown, no evidence of rash  Skin: No evidence of breakdown, no evidence of rash, calluses on knees  Neurologic:Alert and oriented x 3. Normal insight and awareness. Intact Memory. Normal language and speech. Cranial nerve exam unremarkable. MMT: Left-sided strength is normal RUE 4- prox to distal. RLE 0/5 HF, KE, KF and 0/5 distally. Sensory exam normal sensation to light touch and proprioception in bilateral upper and lower extremities Cerebellar exam unable to do on right LE due to weakness , intact RUE FNF Musculoskeletal: Full range of motion in all 4 extremities. No joint swelling     Assessment/Plan: 1. Functional deficits which require 3+ hours per day of interdisciplinary therapy in a  comprehensive inpatient rehab setting. Physiatrist is providing close team supervision and 24 hour management of active medical problems listed below. Physiatrist and rehab team continue to assess barriers to discharge/monitor patient progress toward functional and medical goals  Care Tool:  Bathing    Body parts bathed by patient: Abdomen, Chest   Body parts bathed by helper: Right arm, Left arm, Front perineal area, Buttocks, Right upper leg, Left upper leg, Right lower leg, Left lower leg     Bathing assist Assist Level: Total Assistance - Patient < 25%     Upper Body Dressing/Undressing Upper body dressing   What is the patient wearing?: Pull over shirt    Upper body assist Assist Level: Maximal Assistance - Patient 25 - 49%    Lower Body Dressing/Undressing Lower body dressing      What is the patient wearing?: Pants, Incontinence brief     Lower body assist Assist for lower body dressing: Maximal Assistance - Patient 25 - 49%     Toileting Toileting    Toileting assist Assist for toileting: Moderate Assistance - Patient 50 - 74%     Transfers  Chair/bed transfer  Transfers assist  Chair/bed transfer activity did not occur: N/A  Chair/bed transfer assist level: Minimal Assistance - Patient > 75%     Locomotion Ambulation   Ambulation assist      Assist level: Maximal Assistance - Patient 25 - 49% Assistive device: Walker-rolling Max distance: 32   Walk 10 feet activity   Assist  Walk 10 feet activity did not occur: Safety/medical concerns  Assist level: Maximal Assistance - Patient 25 - 49% Assistive device: Walker-rolling   Walk 50 feet activity   Assist Walk 50 feet with 2 turns activity did not occur: Safety/medical concerns  Assist level: 2 helpers Assistive device: Hand held assist    Walk 150 feet activity   Assist Walk 150 feet activity did not occur: Safety/medical concerns         Walk 10 feet on uneven surface   activity   Assist Walk 10 feet on uneven surfaces activity did not occur: Safety/medical concerns         Wheelchair     Assist Is the patient using a wheelchair?: Yes Type of Wheelchair: Manual    Wheelchair assist level: Supervision/Verbal cueing Max wheelchair distance: 15ft    Wheelchair 50 feet with 2 turns activity    Assist        Assist Level: Supervision/Verbal cueing   Wheelchair 150 feet activity     Assist      Assist Level: Supervision/Verbal cueing   Blood pressure 112/66, pulse 73, temperature 98.2 F (36.8 C), temperature source Oral, resp. rate 20, height 5\' 9"  (1.753 m), weight 99.3 kg, SpO2 98 %.  Medical Problem List and Plan: 1. 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  Antithrombotics: -DVT/anticoagulation:  Pharmaceutical: Heparin 5000U q8h- amb HHA 120' may d/c heparin              -antiplatelet therapy: Aspirin 81 mg daily and Brilinta 90 mg twice daily 3. Pain Management: Tylenol as needed 4. Mood/Behavior/Sleep: Provide emotional support             -antipsychotic agents: N/A 5. Neuropsych/cognition: This patient is not capable of making decisions on his own behalf. 6. Skin/Wound Care: Routine skin checks 7. Fluids/Electrolytes/Nutrition: Routine in and outs with follow-up chemistries D/c CBG  CBG (last 3)  Recent Labs    08/18/22 0723 08/18/22 1709 08/19/22 1137  GLUCAP 185* 100* 98     8.  Seizure prophylaxis.  EEG negative.  Continue Keppra 500 mg daily 9.  AKI on CKD stage IV.  Follow-up chemistries.  Follow-up outpatient nephrology services             -pt given lokelma prior to rehab admit             Nephro input appreciated  5/24  -recheck bmet again tomorrow  -will hold chlorthalidone for now  -check weight, I/O's fairly balanced    Latest Ref Rng & Units 08/18/2022    5:28 AM 08/17/2022    9:45 AM 08/16/2022    7:50 AM  BMP  Glucose 70 -  99 mg/dL 161  75  99   BUN 6 - 20 mg/dL 096  045  409   Creatinine 0.61 - 1.24 mg/dL 8.11  9.14  7.82   Sodium 135 - 145 mmol/L 137  138  136   Potassium 3.5 - 5.1 mmol/L 4.8  5.0  4.4   Chloride 98 - 111 mmol/L 102  101  103   CO2 22 - 32 mmol/L 22  23  22    Calcium 8.9 - 10.3 mg/dL 9.6  95.6  9.5      10.  Hyperlipidemia.  Crestor 20mg  QD 11.  Permissive hypertension.  Patient on amlodipine 10 mg daily, hydralazine 50 mg twice daily, labetalol 200 mg twice daily prior to admission.  Resume as needed  -5/24 Vitals:   08/16/22 0601 08/16/22 1459 08/16/22 2116 08/17/22 0518  BP: 98/74 113/85 (!) 99/42 123/75   08/17/22 1319 08/18/22 0555 08/18/22 1423 08/18/22 1957  BP: 116/76 110/80 122/80 99/84   08/19/22 0514 08/19/22 1321 08/19/22 1949 08/20/22 0552  BP: 111/84 (!) 126/92 115/64 112/66    12.  Medical noncompliance.  Provide counseling 13. Constipation/incontinence of bowel:  Cont BM 5/22  LOS: 19 days A FACE TO FACE EVALUATION WAS PERFORMED  Erick Colace 08/20/2022, 9:34 AM

## 2022-08-21 DIAGNOSIS — K5900 Constipation, unspecified: Secondary | ICD-10-CM

## 2022-08-21 LAB — CBC
HCT: 38.4 % — ABNORMAL LOW (ref 39.0–52.0)
Hemoglobin: 12.6 g/dL — ABNORMAL LOW (ref 13.0–17.0)
MCH: 31.3 pg (ref 26.0–34.0)
MCHC: 32.8 g/dL (ref 30.0–36.0)
MCV: 95.3 fL (ref 80.0–100.0)
Platelets: 246 10*3/uL (ref 150–400)
RBC: 4.03 MIL/uL — ABNORMAL LOW (ref 4.22–5.81)
RDW: 12.4 % (ref 11.5–15.5)
WBC: 6 10*3/uL (ref 4.0–10.5)
nRBC: 0 % (ref 0.0–0.2)

## 2022-08-21 LAB — BASIC METABOLIC PANEL
Anion gap: 13 (ref 5–15)
BUN: 111 mg/dL — ABNORMAL HIGH (ref 6–20)
CO2: 20 mmol/L — ABNORMAL LOW (ref 22–32)
Calcium: 9.2 mg/dL (ref 8.9–10.3)
Chloride: 106 mmol/L (ref 98–111)
Creatinine, Ser: 4.23 mg/dL — ABNORMAL HIGH (ref 0.61–1.24)
GFR, Estimated: 16 mL/min — ABNORMAL LOW (ref >60)
Glucose, Bld: 101 mg/dL — ABNORMAL HIGH (ref 70–99)
Potassium: 4.7 mmol/L (ref 3.5–5.1)
Sodium: 139 mmol/L (ref 135–145)

## 2022-08-21 MED ORDER — POLYETHYLENE GLYCOL 3350 17 G PO PACK
17.0000 g | PACK | Freq: Two times a day (BID) | ORAL | Status: DC
  Administered 2022-08-23: 17 g via ORAL
  Filled 2022-08-21 (×16): qty 1

## 2022-08-21 NOTE — Progress Notes (Addendum)
Occupational Therapy Session Note  Patient Details  Name: Joseph Bright MRN: [id number] Date of Birth: 02-22-1976  Today's Date: 08/21/2022 OT Individual Time: 1478-2956 OT Individual Time Calculation (min): 43 min   Today's Date: 08/21/2022 OT Individual Time: 1334-1415 OT Individual Time Calculation (min): 41 min   Short Term Goals: Week 3:  OT Short Term Goal 1 (Week 3): Pt will utilize RUE during functional tasks 50% of time with supervision OT Short Term Goal 2 (Week 3): Pt wil complete squat pivot toilet transfers min A consistently using LRAD OT Short Term Goal 3 (Week 3): Pt will complete sit>stands CGA consistently  Skilled Therapeutic Interventions/Progress Updates:   Session 1: Pt received sitting in [facility] for skilled OT session with focus on functional mobility at [facility] level and IADL participation. Pt agreeable to interventions, demonstrating overall pleasant mood. Pt with no reports of pain.  OT offering intermediate rest breaks and positioning suggestions throughout session to address pain/fatigue and maximize participation/safety in session.   Pt dependent for donning of R-shoe/AFO. Self-propelling WC with hemi-technique, requiring Min cuing for attention to R-side of environment. Pt propels towards main therapy gym, reaching elevators, requiring transport remainder of way due to fatigue.   In main therapy gym, pt participates in [facility] mobility activity targeting manuevering around obstacles found in the home environment, including chairs and tables. Pt completes activity with more than reasonable time with some occurrences of bumping into objects on his R-side. Pt dependently transport to ADL apartment for energy conservation. In ADL apartment, pt simulates simple meal prepping of frozen meal. Pt and OT discuss energy conservation and organizational techniques for increased efficiency and independence with meal prepping. Pt and OT also review meal transportation with patient  encouraged to use close-lid containers and LUE as gross stabilizer. Pt receptive of all education.   Back in patient room, pt with urgency, requiring Mod A for LB clothing and urinal management in standing.   Pt remained sitting in [facility] with all immediate needs met at end of session. Pt continues to be appropriate for skilled OT intervention to promote further functional independence.  Session 2: Pt received sitting in [facility] for skilled OT session with focus on discharge planning and caregiver education. Pt agreeable to interventions, demonstrating overall pleasant mood. Pt with no reports of pain. OT offering intermediate rest breaks and positioning suggestions throughout session to address potential pain/fatigue and maximize participation/safety in session.  Pt's caregiver (ex wife?) present for caregiver education, insight into OT role and POC provided. Pt self-propels from room towards ADL apartment with hemi-technique, requiring transport for less than half the distance due to fatigue. In ADL apartment (tub room), TTB squat pivot transfer demonstrated with caregiver demonstrating safe technique x2 trials.   Pt's caregiver encouraged to return for further education and hands-on practice, once they more aware of new apartment layout and accessibility features.  Pt remained sitting in [facility] with all immediate needs met at end of session. Pt continues to be appropriate for skilled OT intervention to promote further functional independence.     Therapy Documentation Precautions:  Precautions Precautions: Fall Precaution Comments: right sided hemiparesis, expressive deficits Restrictions Weight Bearing Restrictions: No   Therapy/Group: Individual Therapy  Lou Cal, OTR/L, MSOT  08/21/2022, 6:25 AM

## 2022-08-21 NOTE — Progress Notes (Signed)
Speech Language Pathology Daily Session Note  Patient Details  Name: Joseph Bright MRN: [id number] Date of Birth: 1975-07-30  Today's Date: 08/21/2022 SLP Individual Time: 1300-1333 SLP Individual Time Calculation (min): 33 min  Short Term Goals: Week 3: SLP Short Term Goal 1 (Week 3): Pt will utilize compensatory word-finding strategies at the sentence level to repair communication breakdowns with 75% accuracy given  min cues. SLP Short Term Goal 2 (Week 3): Pt will verbally respond to open ended wh questions with 80% acc given min A SLP Short Term Goal 3 (Week 3): Pt will produce 5-6 word sentences with 75% acc given min verbal cues SLP Short Term Goal 4 (Week 3): Pt will use written communication at the word level to repair communication breakdowns with 75% acc given min A  Skilled Therapeutic Interventions:  Patient was seen in PM for family education session. Pt was alert and seated upright in WC. [name] present during session and receptive to family education. SLP instructed pt and caregiver in role of SLP, dx of aphasia, and current POC. Additional training provided in effective cueing strategies, hierarchy, and SLP recommendations. Examples of circumlocution, substitution strategies, and writing information down provided to pt's former wife. Information was supplemented with handouts. [name] asked thoughtful questions and provided feedback as necessary regarding pt's current level of need. All answers were addressed and pt and caregiver were encouraged to f/u if additional questions or concerns arise. PT was left seated upright in [facility] with call button within reach and family member present. SLP to continue POC.   Pain Pain Assessment Pain Scale: 0-10 Pain Score: 0-No pain  Therapy/Group: Individual Therapy  Renaee Munda 08/21/2022, 3:57 PM

## 2022-08-21 NOTE — Progress Notes (Signed)
Physical Therapy Session Note  Patient Details  Name: Joseph Bright MRN: [id number] Date of Birth: 07/11/1975  Today's Date: 08/21/2022 PT Individual Time: 1415-1530 PT Individual Time Calculation (min): 75 min   Short Term Goals: Week 2:  PT Short Term Goal 1 (Week 2): Pt will perform supine<>sit with CGA PT Short Term Goal 1 - Progress (Week 2): Progressing toward goal PT Short Term Goal 2 (Week 2): Pt will perform sit<>stands using LRAD with min assist PT Short Term Goal 2 - Progress (Week 2): Met PT Short Term Goal 3 (Week 2): Pt will perform bed<>chair transfers using LRAD with min assist PT Short Term Goal 3 - Progress (Week 2): Met PT Short Term Goal 4 (Week 2): Pt will ambulate at least 54ft using LRAD with mod assist of 1 and +2 assist as needed PT Short Term Goal 4 - Progress (Week 2): Met PT Short Term Goal 5 (Week 2): Pt will navigate 4 stairs using B HRs with mod assist of 1 and +2 for safety as needed PT Short Term Goal 5 - Progress (Week 2): Met Week 3:  PT Short Term Goal 1 (Week 3): Pt will perform supine<>sit with CGA consistently PT Short Term Goal 2 (Week 3): Pt will perform sit<>stands using LRAD with CGA PT Short Term Goal 3 (Week 3): Pt will perform bed<>chair transfers using LRAD with CGA PT Short Term Goal 4 (Week 3): Pt will ambulate at least 69ft in controlled environment using LRAD with min assist of 1 and +2 for safety as needed PT Short Term Goal 5 (Week 3): Pt will navigate 4 steps using HRs with min assist of 1 and +2 for safety as needed  Skilled Therapeutic Interventions/Progress Updates:  Patient seated upright in w/c on entrance to room. Patient alert and agreeable to PT session. [name], Joseph Bright, present and roommate also present.   Patient with no pain complaint at start of session.  Joseph Bright relates that she is helping to arrange move of pt to ground floor apartment with level entry.   Therapeutic Activity: Transfers: Performed one squat pivot  transfer w/c<>bed with CGA and demonstrating with verbal instruction to family re: body positioning and assist provided. Joseph Bright is able to perform with good positioning and pt demonstrating good sequencing of position to prepare with increased focus to foot flat position of RLE prior to all transfers. Pt demos improvement throughout session to adjusting RLE positioning without vc. Family aware as well. Next roommate is able to perform well. Both providing CGA in each direction with minimal vc during transfer.   Also demonstrated attachment/ removal of leg rests to w/c with family able to return demo.   Car transfer initiated with all able to perform with CGA to complete. Minimal cueing d/t retaining sequencing of transfer and safe positioning required from w/c<>bed transfer training. Overall CGA with cues to pt to attempt to bring RLE into car with minimal assist. Intermittent need for light MinA to clear door frame into footwell of car.   Gait Training:  All agreeable to stair training since move has not moved yet and to prepare for potential need to climb stairs to enter apartment as well as potential encounter in community. AFO donned over brace for improved R ankle positioning. Stair training initiated with therapist demonstrating pt's impairments and positions during stair navigation that require heavier physical assist for family ed. Demonstrated 2 steps with pt and need for guarding to R knee into TKE and then Joseph Bright taking over  assist to R side. Provided her with vc for sequencing and she is able to perform as well as to provide cueing for pt to initiate TKE on his own. Improved ability of pt to clear step with RLE d/t AFO. Demonstrated need for RLE advancement assist to turn in order to descend steps.   Allowed.Joseph Bright to reposition to be in front of pt to descend steps. Provided vc/ tc for pt to position to L side and then Joseph Bright to assist RLE to clear step and pt then initiating slow descent  to step. She is able to cue to for TKE prior to LLE descent. Uses BHR to descend. Cueing prn throughout for Joseph Bright, and +2 provided up to CGA/ MinA to maintain balance while training family. Performs well and demos learning.   On return to room, pt requests to toilet and requests Joseph Bright to assist with urinal. Discussion with roommate outside door during toileting, however, on return into room, Joseph Bright is pushing pt into bathroom in STEDY to use toilet for bowel movement. Relates pt instructed her on use. Assisted pt in controlled descent to toilet and informed RN re: pt's position on toilet with Joseph Bright providing supervision. Related to Joseph Bright that she will require assist from staff to rise from toilet and complete pericare. RN to provide assist as NT on break.   Patient seated on toilet at end of session with STEDY brakes locked, ex-wife providing supervision, and all needs within reach. RN taking over care.    Therapy Documentation Precautions:  Precautions Precautions: Fall Precaution Comments: right sided hemiparesis, expressive deficits Restrictions Weight Bearing Restrictions: No General:   Vital Signs:  Pain: Pain Assessment Pain Scale: 0-10 Pain Score: 0-No pain   Therapy/Group: Individual Therapy  Loel Dubonnet PT, DPT, CSRS 08/21/2022, 6:06 PM

## 2022-08-21 NOTE — Progress Notes (Signed)
Physical Therapy Session Note  Patient Details  Name: Joseph Bright MRN: [id number] Date of Birth: [birth year]  Today's Date: 08/21/2022 PT Individual Time: 1031-1117 PT Individual Time Calculation (min): 46 min   Short Term Goals: Week 2:  PT Short Term Goal 1 (Week 2): Pt will perform supine<>sit with CGA PT Short Term Goal 1 - Progress (Week 2): Progressing toward goal PT Short Term Goal 2 (Week 2): Pt will perform sit<>stands using LRAD with min assist PT Short Term Goal 2 - Progress (Week 2): Met PT Short Term Goal 3 (Week 2): Pt will perform bed<>chair transfers using LRAD with min assist PT Short Term Goal 3 - Progress (Week 2): Met PT Short Term Goal 4 (Week 2): Pt will ambulate at least 33ft using LRAD with mod assist of 1 and +2 assist as needed PT Short Term Goal 4 - Progress (Week 2): Met PT Short Term Goal 5 (Week 2): Pt will navigate 4 stairs using B HRs with mod assist of 1 and +2 for safety as needed PT Short Term Goal 5 - Progress (Week 2): Met Week 3:  PT Short Term Goal 1 (Week 3): Pt will perform supine<>sit with CGA consistently PT Short Term Goal 2 (Week 3): Pt will perform sit<>stands using LRAD with CGA PT Short Term Goal 3 (Week 3): Pt will perform bed<>chair transfers using LRAD with CGA PT Short Term Goal 4 (Week 3): Pt will ambulate at least 37ft in controlled environment using LRAD with min assist of 1 and +2 for safety as needed PT Short Term Goal 5 (Week 3): Pt will navigate 4 steps using HRs with min assist of 1 and +2 for safety as needed  Skilled Therapeutic Interventions/Progress Updates:  Patient seated upright in w/c on entrance to room. Patient alert and agreeable to PT session.   Patient with no pain complaint at start of session.  Therapeutic Activity: Bed Mobility: Pt performed supine <> sit with ***. VC/ tc required for ***. Transfers: Pt performed sit<>stand and stand pivot transfers throughout session with ***. Provided verbal cues  for***.  Gait Training:  Pt ambulated *** ft using *** with ***. Demonstrated ***. Provided vc/ tc for ***.  Wheelchair Mobility:  Pt propelled wheelchair *** feet with ***. Provided vc/ tc for ***.  Neuromuscular Re-ed: NMR facilitated during session with focus on***. Pt guided in ***. NMR performed for improvements in motor control and coordination, balance, sequencing, judgement, and self confidence/ efficacy in performing all aspects of mobility at highest level of independence.   Patient *** at end of session with brakes locked, *** alarm set, and all needs within reach.  - stair training, followed by transfer training to mat table <>w/c. - reviewed who is coming for family ed, what pt needs to work on, what primary therapist wants to work on.  - improved ability to perform seat to seat transfers using squat/ stand pivot - heavy CGA to L and light MinA to R.   Therapy Documentation Precautions:  Precautions Precautions: Fall Precaution Comments: right sided hemiparesis, expressive deficits Restrictions Weight Bearing Restrictions: No General:   Vital Signs:   Pain: Pain Assessment Pain Scale: 0-10 Pain Score: 0-No pain Mobility:   Locomotion :    Trunk/Postural Assessment :    Balance:   Exercises:   Other Treatments:      Therapy/Group: Individual Therapy  Loel Dubonnet PT, DPT, CSRS 08/21/2022, 1:04 PM

## 2022-08-21 NOTE — Progress Notes (Signed)
PROGRESS NOTE   Subjective/Complaints:  Sitting in WC. No new complaints or concerns this AM.   ROS: Patient denies CP, SOB, abdominal pain,  N/V/D.      Objective:   No results found. Recent Labs    08/21/22 0551  WBC 6.0  HGB 12.6*  HCT 38.4*  PLT 246      Recent Labs    08/21/22 0551  NA 139  K 4.7  CL 106  CO2 20*  GLUCOSE 101*  BUN 111*  CREATININE 4.23*  CALCIUM 9.2      Intake/Output Summary (Last 24 hours) at 08/21/2022 0826 Last data filed at 08/21/2022 0725 Gross per 24 hour  Intake 838 ml  Output 675 ml  Net 163 ml         Physical Exam: Vital Signs Blood pressure 119/80, pulse 76, temperature 98.6 F (37 C), temperature source Oral, resp. rate 18, height 5\' 9"  (1.753 m), weight 99.3 kg, SpO2 100 %.    General: No acute distress, sitting in WC, appears comfortable  Mood and affect are appropriate Heart: RRR Lungs: Clear to auscultation,no increased WOB Abdomen: Positive bowel sounds, soft nontender to palpation, nondistended Extremities: No clubbing, cyanosis, or edema Skin: No evidence of breakdown, no evidence of rash  Skin: No evidence of breakdown, no evidence of rash, calluses on knees  Neurologic:Alert and oriented x 3. Follows commands, Normal insight and awareness. Intact Memory. Normal language and speech. Cranial nerve exam unremarkable. MMT: Left-sided strength is normal RUE 4- prox to distal. RLE 0/5 HF, KE, KF and 0/5 distally. Sensory exam normal sensation to light touch and proprioception in bilateral upper and lower extremities Cerebellar exam unable to do on right LE due to weakness , intact RUE FNF Musculoskeletal: Full range of motion in all 4 extremities. No joint swelling     Assessment/Plan: 1. Functional deficits which require 3+ hours per day of interdisciplinary therapy in a comprehensive inpatient rehab setting. Physiatrist is providing close team  supervision and 24 hour management of active medical problems listed below. Physiatrist and rehab team continue to assess barriers to discharge/monitor patient progress toward functional and medical goals  Care Tool:  Bathing    Body parts bathed by patient: Right arm, Left arm, Chest, Abdomen, Front perineal area, Right upper leg, Left upper leg, Left lower leg, Face   Body parts bathed by helper: Buttocks, Right lower leg     Bathing assist Assist Level: Minimal Assistance - Patient > 75%     Upper Body Dressing/Undressing Upper body dressing   What is the patient wearing?: Pull over shirt    Upper body assist Assist Level: Set up assist    Lower Body Dressing/Undressing Lower body dressing      What is the patient wearing?: Incontinence brief, Pants     Lower body assist Assist for lower body dressing: Minimal Assistance - Patient > 75%     Toileting Toileting    Toileting assist Assist for toileting: Moderate Assistance - Patient 50 - 74%     Transfers Chair/bed transfer  Transfers assist  Chair/bed transfer activity did not occur: N/A  Chair/bed transfer assist level: Minimal Assistance - Patient >  75%     Locomotion Ambulation   Ambulation assist      Assist level: Maximal Assistance - Patient 25 - 49% Assistive device: Walker-rolling Max distance: 32   Walk 10 feet activity   Assist  Walk 10 feet activity did not occur: Safety/medical concerns  Assist level: Maximal Assistance - Patient 25 - 49% Assistive device: Walker-rolling   Walk 50 feet activity   Assist Walk 50 feet with 2 turns activity did not occur: Safety/medical concerns  Assist level: 2 helpers Assistive device: Hand held assist    Walk 150 feet activity   Assist Walk 150 feet activity did not occur: Safety/medical concerns         Walk 10 feet on uneven surface  activity   Assist Walk 10 feet on uneven surfaces activity did not occur: Safety/medical  concerns         Wheelchair     Assist Is the patient using a wheelchair?: Yes Type of Wheelchair: Manual    Wheelchair assist level: Supervision/Verbal cueing Max wheelchair distance: 18ft    Wheelchair 50 feet with 2 turns activity    Assist        Assist Level: Supervision/Verbal cueing   Wheelchair 150 feet activity     Assist      Assist Level: Supervision/Verbal cueing   Blood pressure 119/80, pulse 76, temperature 98.6 F (37 C), temperature source Oral, resp. rate 18, height 5\' 9"  (1.753 m), weight 99.3 kg, SpO2 100 %.  Medical Problem List and Plan: 1. 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  Antithrombotics: -DVT/anticoagulation:  Pharmaceutical: Heparin 5000U q8h- amb HHA 120' may d/c heparin              -antiplatelet therapy: Aspirin 81 mg daily and Brilinta 90 mg twice daily 3. Pain Management: Tylenol as needed 4. Mood/Behavior/Sleep: Provide emotional support             -antipsychotic agents: N/A 5. Neuropsych/cognition: This patient is not capable of making decisions on his own behalf. 6. Skin/Wound Care: Routine skin checks 7. Fluids/Electrolytes/Nutrition: Routine in and outs with follow-up chemistries D/c CBG  CBG (last 3)  Recent Labs    08/18/22 1709 08/19/22 1137  GLUCAP 100* 98     8.  Seizure prophylaxis.  EEG negative.  Continue Keppra 500 mg daily 9.  AKI on CKD stage IV.  Follow-up chemistries.  Follow-up outpatient nephrology services             -pt given lokelma prior to rehab admit             Nephro input appreciated 5/24  -recheck bmet again tomorrow  -will hold chlorthalidone for now  -check  weight-ordered daily, I/O's fairly balanced  Filed Weights   08/01/22 1644 08/12/22 0600 08/14/22 1500  Weight: 107.7 kg 107.7 kg 99.3 kg        Latest Ref Rng & Units 08/21/2022    5:51 AM 08/18/2022    5:28 AM 08/17/2022    9:45 AM  BMP  Glucose  70 - 99 mg/dL 161  096  75   BUN 6 - 20 mg/dL 045  409  811   Creatinine 0.61 - 1.24 mg/dL 9.14  7.82  9.56   Sodium 135 - 145 mmol/L 139  137  138   Potassium 3.5 - 5.1 mmol/L 4.7  4.8  5.0   Chloride 98 - 111 mmol/L 106  102  101   CO2 22 - 32 mmol/L 20  22  23    Calcium 8.9 - 10.3 mg/dL 9.2  9.6  21.3      10.  Hyperlipidemia.  Crestor 20mg  QD 11.  Permissive hypertension.  Patient on amlodipine 10 mg daily, hydralazine 50 mg twice daily, labetalol 200 mg twice daily prior to admission.  Resume as needed  -5/27 BP well controlled Vitals:   08/17/22 0518 08/17/22 1319 08/18/22 0555 08/18/22 1423  BP: 123/75 116/76 110/80 122/80   08/18/22 1957 08/19/22 0514 08/19/22 1321 08/19/22 1949  BP: 99/84 111/84 (!) 126/92 115/64   08/20/22 0552 08/20/22 1401 08/20/22 1955 08/21/22 0517  BP: 112/66 127/87 121/81 119/80    12.  Medical noncompliance.  Provide counseling 13. Constipation/incontinence of bowel: -5/27 increase miralax to BID  LOS: 20 days A FACE TO FACE EVALUATION WAS PERFORMED  Fanny Dance 08/21/2022, 8:26 AM

## 2022-08-22 NOTE — Progress Notes (Addendum)
Patient ID: Arvester Brandow, male   DOB: 04-Oct-1975, 47 y.o.   MRN: [id number]  Additional family education requested from spouse. SW will wait for follow up. No additional questions or concerns.   3:05 PM: Sw spoke with pt's spouse, [place].   Spouse expressed that she anticipates to move to the main level apartment. Spouse expressed that with the move pt's friend/roommate will be unable to stay with them due to downsizing. Spouse has expressed and would like to staff to know pt's friend/roommate will not be moving with them to assist in the home at d/c.  Spouse expressed that she feels family education went well and she feels confident. Sw will provide spouse with DME once listed. Spouse plans to arrange additional days for education and she will FU with Sw on preferred days. No additional questions or concerns .

## 2022-08-22 NOTE — Progress Notes (Signed)
Physical Therapy Session Note  Patient Details  Name: Joseph Bright MRN: [id number] Date of Birth: Sep 28, 1975  Today's Date: 08/22/2022 PT Individual Time: 0903-1001 PT Individual Time Calculation (min): 58 min   Short Term Goals: Week 3:  PT Short Term Goal 1 (Week 3): Pt will perform supine<>sit with CGA consistently PT Short Term Goal 2 (Week 3): Pt will perform sit<>stands using LRAD with CGA PT Short Term Goal 3 (Week 3): Pt will perform bed<>chair transfers using LRAD with CGA PT Short Term Goal 4 (Week 3): Pt will ambulate at least 64ft in controlled environment using LRAD with min assist of 1 and +2 for safety as needed PT Short Term Goal 5 (Week 3): Pt will navigate 4 steps using HRs with min assist of 1 and +2 for safety as needed  Skilled Therapeutic Interventions/Progress Updates:    Pt received awake, supine in bed and eager to participate in therapy session. Supine>sitting R EOB, HOB flat but relying heavily on bedrails with L UE support to bring trunk upright with min assist, pt uses L LE to assist with managing R LE off EOB. Pt now reporting he does not feel he needs a hospital bed at D/C - will continue to discuss with pt and family but therapist also feels if pt has bedrails he could be successful.  Sitting EOB threaded on pants, donned socks, shoes, and R LE [place] Walk-on PLS AFO total assist for time management. Sit>stand EOB>no UE support with pt continuing to demo L LE weight shift bias with min assist for balance while pulling pants up over hips without assist and pt using R UE to manage R side of clothing. L stand pivot to w/c with min assist for balance, blocking/guarding R knee, and total assist manual facilitation for stepping R LE back to complete transfer. Sitting in w/c at sink perform oral care with set-up assist - cuing for increased incorporation of R UE.Transported to/from gym in w/c for time management and energy conservation.  Gait training 121ft using +2 L  HHA (2nd person providing min A) with mod assist for balance and R LE management. Pt demonstrating the following gait deviations with therapist providing the described cuing and facilitation for improvement:  - toe cap donned for swing phase - wearing R LE [place] Walk-on PLS AFO -advances R LE >50% of the way consistently and initially advances it >75% until fatigued after initial ~30-38ft on 1st gait trial - continues to keep R LE slightly flexed during stance or moves quickly back into full extension causing quick forward movement of his body and quick L step forward - achieves reciprocal stepping pattern   Gait training 13ft x2 using RW with +2 min assist for balance safety and intermittent AD assist while therapist providing mod assist primarily for R LE management. Pt demonstrating the following gait deviations with therapist providing the described cuing and facilitation for improvement:  - due to RW, pt takes slightly shorter L LE step lengths making it more challenging to advance R LE  - pt able to advance R LE ~25% of the way during swing but otherwise requires max assist to advance for reciprocal step length (continue to feel some possible flexor tone and/or impaired motor planning/muscle activation with hamstrings kicking in during swing making it difficult for therapist to achieve heel strike on initial contact to kick on quads to start stance phase) - continues to have good R LE knee control during stance, but continues to keep knee slightly flexed -  RW veers towards L requiring assistance to direct it   Stair navigation training ascending/descending 8 steps (6" height) using L UE support on each HR with mod assist of 1 for balance and R LE management with +2 guarding for safety due to focus on R LE NMR via ascending with reciprocal stepping on ascent and step-to pattern leading with R LE on descend - therapist completely blocking R knee on ascent to prevent buckle due to challenging  pt by cuing for reciprocal patter to promote increased R LE hip/knee extension activation  - continues to require total assist to advance R LE onto each step in both directions    L hemi-technique w/c propulsion ~169ft back towards room with supervision and pt continuing to have short push-strokes with L UE to avoid consistent R veering, some difficulty coordinating L UE and L LE movements to navigate in smaller spaces such as the room.  At end of session, pt left seated in w/c with needs in reach (per team discussion pt no longer needing seat belt alarm).   Therapy Documentation Precautions:  Precautions Precautions: Fall Precaution Comments: right sided hemiparesis, expressive deficits Restrictions Weight Bearing Restrictions: No   Pain:  No reports of pain throughout session.    Therapy/Group: Individual Therapy  Ginny Forth , PT, DPT, NCS, CSRS 08/22/2022, 7:45 AM

## 2022-08-22 NOTE — Progress Notes (Signed)
Physical Therapy Session Note  Patient Details  Name: Joseph Bright MRN: [id number] Date of Birth: 09/20/1975  Today's Date: 08/22/2022 PT Individual Time: 1435-1516 PT Individual Time Calculation (min): 41 min   Short Term Goals: Week 2:  PT Short Term Goal 1 (Week 2): Pt will perform supine<>sit with CGA PT Short Term Goal 1 - Progress (Week 2): Progressing toward goal PT Short Term Goal 2 (Week 2): Pt will perform sit<>stands using LRAD with min assist PT Short Term Goal 2 - Progress (Week 2): Met PT Short Term Goal 3 (Week 2): Pt will perform bed<>chair transfers using LRAD with min assist PT Short Term Goal 3 - Progress (Week 2): Met PT Short Term Goal 4 (Week 2): Pt will ambulate at least 6ft using LRAD with mod assist of 1 and +2 assist as needed PT Short Term Goal 4 - Progress (Week 2): Met PT Short Term Goal 5 (Week 2): Pt will navigate 4 stairs using B HRs with mod assist of 1 and +2 for safety as needed PT Short Term Goal 5 - Progress (Week 2): Met Week 3:  PT Short Term Goal 1 (Week 3): Pt will perform supine<>sit with CGA consistently PT Short Term Goal 2 (Week 3): Pt will perform sit<>stands using LRAD with CGA PT Short Term Goal 3 (Week 3): Pt will perform bed<>chair transfers using LRAD with CGA PT Short Term Goal 4 (Week 3): Pt will ambulate at least 40ft in controlled environment using LRAD with min assist of 1 and +2 for safety as needed PT Short Term Goal 5 (Week 3): Pt will navigate 4 steps using HRs with min assist of 1 and +2 for safety as needed  Skilled Therapeutic Interventions/Progress Updates:  Patient seated upright in w/c on entrance to room. Patient alert and agreeable to PT session. Relates that primary PT donned AFO but no ankle brace earlier this day.   Patient with no pain complaint at start of session.  Therapeutic Activity: Transfers: Pt performed squat pivot transfers w/c<>mat table with close supervision to L and light CGA to R providing cues  for R foot flat placement and positioning prior to initiation.   Sit<>stand transfers continue with CGA to RW with bias to L side. Is able to bring R hand to saddle splint and secure with strap prior to rise to stand and releasing strap prior to descent to sit.   Gait Training/ NMR:  Pt ambulated 1' x1/ 5' x1/ 22' x1 using RW with overall ModA mainly for RLE advancement. First 2 bouts with slow progression and initiation of RLE advancement with L heel raise to assist with hip hike on R in order to attempt improved foot clearance. Pt cued in 3rd bout to shorten L step length for ease of R foot progression with focus on R hip/ knee flexion to clear. Throughout all bouts, pt demonstrates increased lean to R and minimal raise of RW on L side requiring MinA/ CGA to maintain R side contact with floor. No worry of LOB. Provided vc/ tc throughout for adjustment to technique in order to attempt to improve RLE quality of step advancement.  NMR performed for improvements in motor control and coordination, balance, sequencing, judgement, and self confidence/ efficacy in performing all aspects of mobility at highest level of independence.   Wheelchair Mobility:  Pt propelled wheelchair ~100 feet with supervision and improved coordination of UE with LE for increased speed and control. No cueing required.  Patient seated upright in w/c at  end of session with brakes locked, no alarm set, and all needs within reach. NT notified a to pt's disposition.    Therapy Documentation Precautions:  Precautions Precautions: Fall Precaution Comments: right sided hemiparesis, expressive deficits Restrictions Weight Bearing Restrictions: No General:   Vital Signs:   Pain:  No pain related this session.  Therapy/Group: Individual Therapy  Loel Dubonnet PT, DPT, CSRS 08/22/2022, 8:53 AM

## 2022-08-22 NOTE — Progress Notes (Signed)
PROGRESS NOTE   Subjective/Complaints:  Slept well, had family training yesterday with [name]   ROS: Patient denies CP, SOB, abdominal pain,  N/V/D.      Objective:   No results found. Recent Labs    08/21/22 0551  WBC 6.0  HGB 12.6*  HCT 38.4*  PLT 246      Recent Labs    08/21/22 0551  NA 139  K 4.7  CL 106  CO2 20*  GLUCOSE 101*  BUN 111*  CREATININE 4.23*  CALCIUM 9.2      Intake/Output Summary (Last 24 hours) at 08/22/2022 1610 Last data filed at 08/22/2022 9604 Gross per 24 hour  Intake 960 ml  Output 350 ml  Net 610 ml         Physical Exam: Vital Signs Blood pressure 119/84, pulse 76, temperature 97.6 F (36.4 C), resp. rate 17, height 5\' 9"  (1.753 m), weight 107.5 kg, SpO2 100 %.    General: No acute distress, sitting in WC, appears comfortable  Mood and affect are appropriate Heart: RRR no murmur  Lungs: Clear to auscultation,no increased WOB Abdomen: Positive bowel sounds, soft nontender to palpation, nondistended Extremities: No clubbing, cyanosis, or edema Skin: No evidence of breakdown, no evidence of rash  Skin: No evidence of breakdown, no evidence of rash, calluses on knees  Neurologic:Alert and oriented x 3. Follows commands, Normal insight and awareness. Intact Memory. Normal language and speech. Cranial nerve exam unremarkable. MMT: Left-sided strength is normal RUE 4- prox to distal. RLE 0/5 HF, KE, KF and 0/5 distally. Sensory exam normal sensation to light touch and proprioception in bilateral upper and lower extremities Cerebellar exam unable to do on right LE due to weakness , intact RUE FNF Musculoskeletal: Full range of motion in all 4 extremities. No joint swelling     Assessment/Plan: 1. Functional deficits which require 3+ hours per day of interdisciplinary therapy in a comprehensive inpatient rehab setting. Physiatrist is providing close team supervision  and 24 hour management of active medical problems listed below. Physiatrist and rehab team continue to assess barriers to discharge/monitor patient progress toward functional and medical goals  Care Tool:  Bathing    Body parts bathed by patient: Right arm, Left arm, Chest, Abdomen, Front perineal area, Right upper leg, Left upper leg, Left lower leg, Face   Body parts bathed by helper: Buttocks, Right lower leg     Bathing assist Assist Level: Minimal Assistance - Patient > 75%     Upper Body Dressing/Undressing Upper body dressing   What is the patient wearing?: Pull over shirt    Upper body assist Assist Level: Set up assist    Lower Body Dressing/Undressing Lower body dressing      What is the patient wearing?: Incontinence brief, Pants     Lower body assist Assist for lower body dressing: Minimal Assistance - Patient > 75%     Toileting Toileting    Toileting assist Assist for toileting: Moderate Assistance - Patient 50 - 74%     Transfers Chair/bed transfer  Transfers assist  Chair/bed transfer activity did not occur: N/A  Chair/bed transfer assist level: Minimal Assistance - Patient >  75%     Locomotion Ambulation   Ambulation assist      Assist level: Maximal Assistance - Patient 25 - 49% Assistive device: Walker-rolling Max distance: 32   Walk 10 feet activity   Assist  Walk 10 feet activity did not occur: Safety/medical concerns  Assist level: Maximal Assistance - Patient 25 - 49% Assistive device: Walker-rolling   Walk 50 feet activity   Assist Walk 50 feet with 2 turns activity did not occur: Safety/medical concerns  Assist level: 2 helpers Assistive device: Hand held assist    Walk 150 feet activity   Assist Walk 150 feet activity did not occur: Safety/medical concerns         Walk 10 feet on uneven surface  activity   Assist Walk 10 feet on uneven surfaces activity did not occur: Safety/medical concerns          Wheelchair     Assist Is the patient using a wheelchair?: Yes Type of Wheelchair: Manual    Wheelchair assist level: Supervision/Verbal cueing Max wheelchair distance: 13ft    Wheelchair 50 feet with 2 turns activity    Assist        Assist Level: Supervision/Verbal cueing   Wheelchair 150 feet activity     Assist      Assist Level: Supervision/Verbal cueing   Blood pressure 119/84, pulse 76, temperature 97.6 F (36.4 C), resp. rate 17, height 5\' 9"  (1.753 m), weight 107.5 kg, SpO2 100 %.  Medical Problem List and Plan: 1. 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  Antithrombotics: -DVT/anticoagulation:  Pharmaceutical: Heparin 5000U q8h- amb HHA 120' may d/c heparin              -antiplatelet therapy: Aspirin 81 mg daily and Brilinta 90 mg twice daily 3. Pain Management: Tylenol as needed 4. Mood/Behavior/Sleep: Provide emotional support             -antipsychotic agents: N/A 5. Neuropsych/cognition: This patient is not capable of making decisions on his own behalf. 6. Skin/Wound Care: Routine skin checks 7. Fluids/Electrolytes/Nutrition: Routine in and outs with follow-up chemistries D/c CBG  CBG (last 3)  Recent Labs    08/19/22 1137  GLUCAP 98     8.  Seizure prophylaxis.  EEG negative.  Continue Keppra 500 mg daily 9.  AKI on CKD stage IV.  Follow-up chemistries.  Follow-up outpatient nephrology services             -pt given lokelma prior to rehab admit             Nephro input appreciated 5/24  -recheck bmet again tomorrow  -will hold chlorthalidone for now  -check weight-ordered daily, I/O's fairly balanced  Filed Weights    08/12/22 0600 08/14/22 1500 08/22/22 0543  Weight: 107.7 kg 99.3 kg 107.5 kg        Latest Ref Rng & Units 08/21/2022    5:51 AM 08/18/2022    5:28 AM 08/17/2022    9:45 AM  BMP  Glucose 70 - 99 mg/dL 161  106  75   BUN 6 - 20 mg/dL 161  096  045   Creatinine 0.61 - 1.24 mg/dL 4.09  8.11  9.14   Sodium 135 - 145 mmol/L 139  137  138   Potassium 3.5 - 5.1 mmol/L 4.7  4.8  5.0   Chloride 98 - 111 mmol/L 106  102  101   CO2 22 - 32 mmol/L 20  22  23    Calcium 8.9 - 10.3 mg/dL 9.2  9.6  78.2      10.  Hyperlipidemia.  Crestor 20mg  QD 11.  Permissive hypertension.  Patient on amlodipine 10 mg daily, hydralazine 50 mg twice daily, labetalol 200 mg twice daily prior to admission.  Resume as needed  -5/27 BP well controlled Vitals:   08/18/22 1423 08/18/22 1957 08/19/22 0514 08/19/22 1321  BP: 122/80 99/84 111/84 (!) 126/92   08/19/22 1949 08/20/22 0552 08/20/22 1401 08/20/22 1955  BP: 115/64 112/66 127/87 121/81   08/21/22 0517 08/21/22 1305 08/21/22 2034 08/22/22 0417  BP: 119/80 (!) 128/95 126/86 119/84    12.  Medical noncompliance.  Provide counseling 13. Constipation/incontinence of bowel: -5/27 increase miralax to BID  LOS: 21 days A FACE TO FACE EVALUATION WAS PERFORMED  Erick Colace 08/22/2022, 8:22 AM

## 2022-08-22 NOTE — Progress Notes (Signed)
Speech Language Pathology Daily Session Note  Patient Details  Name: Joseph Bright MRN: [id number] Date of Birth: Feb 14, 1976  Today's Date: 08/22/2022 SLP Individual Time: 1330-1400 SLP Individual Time Calculation (min): 30 min  Short Term Goals: Week 3: SLP Short Term Goal 1 (Week 3): Pt will utilize compensatory word-finding strategies at the sentence level to repair communication breakdowns with 75% accuracy given  min cues. SLP Short Term Goal 2 (Week 3): Pt will verbally respond to open ended wh questions with 80% acc given min A SLP Short Term Goal 3 (Week 3): Pt will produce 5-6 word sentences with 75% acc given min verbal cues SLP Short Term Goal 4 (Week 3): Pt will use written communication at the word level to repair communication breakdowns with 75% acc given min A  Skilled Therapeutic Interventions:  Pt was seen in PM to address expressive language aphasia. Pt was alert and seated upright in WC. He denied pain and was agreeable for session. SLP challenged pt in functional communication through role play scenarios. Pt instructed to narrate his recent medical hx as if he was attending a medical appt. Pt completed task while accurately answering yes/ no questions with 90% acc, responding to open ended questions with 80% acc with min A and producing 5-6 word sentences with 67% acc with min to mod A. Continued difficulty observed with communicating abstract thoughts and complex information with subsequent communication breakdowns. Pt states, "How do I say this" or occasionally abandons thoughts c/b saying "I don't know." Pt subsequently benefits from rephrasing of information in order to continue to expand upon thoughts. Pt was left seated upright in WC with call button within reach and chair alarm active. SLP to continue POC.  Pain Pain Assessment Pain Scale: 0-10 Pain Score: 0-No pain  Therapy/Group: Individual Therapy  Renaee Munda 08/22/2022, 2:06 PM

## 2022-08-22 NOTE — Progress Notes (Signed)
Occupational Therapy Session Note  Patient Details  Name: Joseph Bright MRN: [id number] Date of Birth: 10/09/1975  Today's Date: 08/22/2022 OT Individual Time: 1000-1115 OT Individual Time Calculation (min): 75 min    Short Term Goals: Week 3:  OT Short Term Goal 1 (Week 3): Pt will utilize RUE during functional tasks 50% of time with supervision OT Short Term Goal 2 (Week 3): Pt wil complete squat pivot toilet transfers min A consistently using LRAD OT Short Term Goal 3 (Week 3): Pt will complete sit>stands CGA consistently  Skilled Therapeutic Interventions/Progress Updates:     Pt received sitting up in wc dressed and ready for the day with all BADL needs met. Pt presenting to be in good spirits receptive to skilled OT session reporting 0/10 pain- OT offering intermittent rest breaks, repositioning, and therapeutic support to optimize participation in therapy session.   Discussed d/c plans with Pt with pt reporting he will be d/c'ing home with his ex-wife who is planning to move to ground level apartment. The new apartment will be accessible via wc and not require Pt to complete stairs. Educated Pt on DME recommendations of DABSC and TTB with Pt receptive to using at d/c and reporting he is feeling confident in completing his partial stand pivot transfers <> TTB and BSC.   Engaged Pt in dynamic standing balance dual tasking activity with functional reaching incorporated into task to simulate BADL and IADL activities. Pt instructed to stand at elevated table with LUE supported on table while completing peg board activity using medium sized pegs to replicate picture pattern. Donned 0.75# wrist weight on Pt's Rue to provide increased proprioceptive feedback and light resistance during functional reaching. Pegs presented to Pt at eye level with Pt able to retrieve pegs with min dropping this session and place into peg board with improved FM control and increased speed compared to previous  session. Maintained standing position ~3 minutes x2 trials with placing and removing pegs with OT providing min A for balance and mod tactile and verbal cues for weight shifting to R and RLE engagement. Pt with improved quad activation this session- able to extend knee in standing when provided tactile cues, however requiring OT assistance facilitating RLE engagement to prevent buckling and maintain knee extension.   Engaged pt in seated FM/VM control activity to facilitate maintained grasp of RUE for increased strength, control, and overall improved function for BADLs. Pt utilized spring Field seismologist with silver spring at level 3 setting to transport medium sized pegs into cones maintaining grasp throughout transportation. Pt able to transports 15 pegs x2 trials with prolonged rest break provided between trials with min dropping initially and mod dropping near end of task d/t fatigue. Pt with improved motor control and decreased dysmetria during VM activity this session..   Pt completed UB therex using 4# weighted dumbbell to increase BUE strength for BADL tasks. Pt completed shoulder internal/external rotation and forearm supination/pronation with OT providing verbal and tactile cues for technique and muscle engagement. Pt able to complete 2x10 reps with rest breaks provided between sets.   Pt propelled wc back to room using hemi technique for endurance and functional mobility training. Pt was handed off to nurse tech at end of session to assist with toileting tasks with all immediate needs met.   Therapy Documentation Precautions:  Precautions Precautions: Fall Precaution Comments: right sided hemiparesis, expressive deficits Restrictions Weight Bearing Restrictions: No  Therapy/Group: Individual Therapy  Army Fossa 08/22/2022, 7:50 AM

## 2022-08-23 MED ORDER — SENNOSIDES-DOCUSATE SODIUM 8.6-50 MG PO TABS
2.0000 | ORAL_TABLET | Freq: Two times a day (BID) | ORAL | Status: DC
  Administered 2022-08-23 – 2022-08-30 (×10): 2 via ORAL
  Filled 2022-08-23 (×15): qty 2

## 2022-08-23 MED ORDER — SORBITOL 70 % SOLN: 30.0000 mL | Freq: Every day | Status: DC | PRN

## 2022-08-23 NOTE — Progress Notes (Signed)
Patient ID: Joseph Bright, male   DOB: Dec 15, 1975, 47 y.o.   MRN: [id number]  Family education tomorrow 1-4 PM.

## 2022-08-23 NOTE — Progress Notes (Signed)
Patient ID: Joseph Bright, male   DOB: 1975/08/21, 47 y.o.   MRN: [id number]  Team Conference Report to Patient/Family  Team Conference discussion was reviewed with the patient and caregiver, including goals, any changes in plan of care and target discharge date.  Patient and caregiver express understanding and are in agreement.  The patient has a target discharge date of 08/31/22.  SW met with patient and spoke with spouse via telephone. Spouse arranged first additional session of education tomorrow. Sw will also arranged Tuesday, 6/5 1-4 PM. Spouse has confirmed her and spouse will move to a level entry apartment. Wheelchair evaluation to be completed today.   Andria Rhein 08/23/2022, 2:23 PM

## 2022-08-23 NOTE — Patient Care Conference (Signed)
Inpatient RehabilitationTeam Conference and Plan of Care Update Date: 08/23/2022   Time: 10:44 AM    Patient Name: Joseph Bright      Medical Record Number: [id number]  Date of Birth: 19-Jan-1976 Sex: Male         Room/Bed: 4W07C/4W07C-01 Payor Info: Payor: [employer]IG[employer] / Plan: CIGNA MANAGED / Product Type: *No Product type* /    Admit Date/Time:  08/01/2022  4:34 PM  Primary Diagnosis:  Cerebrovascular accident (CVA) due to occlusion of left posterior communicating artery [facility])  Hospital Problems: Principal Problem:   Cerebrovascular accident (CVA) due to occlusion of left posterior communicating artery (HCC) Active Problems:   Adjustment disorder with mixed anxiety and depressed mood    Expected Discharge Date: Expected Discharge Date: 08/31/22  Team Members Present: Physician leading conference: Dr. Claudette Laws Social Worker Present: Lavera Guise, BSW Nurse Present: Chana Bode, RN PT Present: Casimiro Needle, PT OT Present: Bonnell Public, OT SLP Present: Feliberto Gottron, SLP PPS Coordinator present : Fae Pippin, SLP     Current Status/Progress Goal Weekly Team Focus  Bowel/Bladder   Continent of bowel, mixed continence of bladder. Bladder program (toilet /q3-4hr)   Regain full continence of B/B   Continue bladder program    Swallow/Nutrition/ Hydration               ADL's   UB dressing/bathing supervision using hemi technique, LB dressing min A progressing to CGA, LB bathing min A using long handled sponge, grooming/hygiene supervision sitting in wc, mon A progressing to min A toielting, squat pivot transfers to BSC/TTB CGA to light min A- improved return of RUE function, requires min-mod VB cues to utilize during fucntional tasks as Pt prefers to compensate. Barriers include decreased activity tolerance, balance deficits, and decreased RU/LE functional use   supervision to min A, progressing towards meeting goals   family ed completed 05/27, BADL retraining,  functional transfer training, RUE NMR, dyanmic sitting balance, AE education, activity tolerance, safety awareness, Pt education    Mobility   min A bed mobility only for R LE management using bedrails, CGA squat pivot transfers using w/c armrests, min A sit<>stands using L UE support, gait training up to 162ft using +2 L HHA or RW with mod assist from therapist for balance and management of R LE (advances R LE 25-75% during swing and stabilzes R LE during stance with only guarding), progressed to stair navigation training step-to pattern using L UE support on each HR with mod A of 1 and +2 guarding for safety   supervision bed mobility, min assist transfers, mod assist gait, mod-I w/c mobility  initiated family education/training on 5/27, family planning to transition pt to 1st floor level entry apartment to allow safe home entry, wheelchair consult scheduled for 5/29, not planning to get an AFO as do not anticipate pt will be a functional ambulator at time of D/C, need additional hands-on family education/training scheduled, continuing to address balance and safety with mobility    Communication   min to mod A for expressing complex information   min A   abstract and complex communication, carryover of comepsnatory strategies, caregiver training    Safety/Cognition/ Behavioral Observations               Pain   No c/o pain at this time   remain pain free   Assess Qshift and prn    Skin   Skin intact   Maintain skin integrity  assess qshift and prn  Discharge Planning:  First family education completed. Pt discharging home with spouse who [facility] assisting 24/7. 15 steps to enter the home. Potential barrier with HH due to insurance.   Team Discussion: Patient post CVA; note good carry overall with training and making functional improvements.  Patient on target to meet rehab goals: yes, currently needs supervision for upper body bathing and dressing and min assist for lower body  care with mod assist for toileting. Needs min assist for squat pivot transfers to a BSC/TTB. Compensates for right side weakness with heavy use on left side.  Able to ambulate up to 140' using a RW with +2 for balance and using the right UE without cues. Needs tactile cues for advancement of right leg and mod assist for managing steps.  Working on [employer] and auditory communication, initiation, and word finding.  Goals for discharge set for min assist - supervision overall.  *See Care Plan and progress notes for long and short-term goals.   Revisions to Treatment Plan:  Refusing PRAFO at HS; no AFO at discharge only  an ASO   Teaching Needs: Safety, medications, dietary modifications, transfers, toileting, etc.   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, on going constipation  Barriers to Discharge: Renal Insufficiency/Failure   Possible Resolutions to [employer] Focus: caregiver trainaing prior to d/c, simplify medication regimen, address constipation with laxatives   Continued Need for Acute Rehabilitation Level of Care: The patient requires daily medical management by a physician with specialized training in physical medicine and rehabilitation for the following reasons: Direction of a multidisciplinary physical rehabilitation program to maximize functional independence : Yes Medical management of patient stability for increased activity during participation in an intensive rehabilitation regime.: Yes Analysis of laboratory values and/or radiology reports with any subsequent need for medication adjustment and/or medical intervention. : Yes   I attest that I was present, lead the  team conference, and concur with the assessment and plan of the team.   Chana Bode B 08/23/2022, 3:47 PM

## 2022-08-23 NOTE — Progress Notes (Addendum)
PROGRESS NOTE   Subjective/Complaints:  Feels ok today , urinates a lot at noc denies foot swelling   ROS: Patient denies CP, SOB, abdominal pain,  N/V/D.      Objective:   No results found. Recent Labs    08/21/22 0551  WBC 6.0  HGB 12.6*  HCT 38.4*  PLT 246      Recent Labs    08/21/22 0551  NA 139  K 4.7  CL 106  CO2 20*  GLUCOSE 101*  BUN 111*  CREATININE 4.23*  CALCIUM 9.2      Intake/Output Summary (Last 24 hours) at 08/23/2022 0816 Last data filed at 08/23/2022 0725 Gross per 24 hour  Intake 708 ml  Output 775 ml  Net -67 ml         Physical Exam: Vital Signs Blood pressure (!) 117/92, pulse 75, temperature 98 F (36.7 C), resp. rate 18, height 5\' 9"  (1.753 m), weight 105.6 kg, SpO2 100 %.    General: No acute distress, sitting in WC, appears comfortable  Mood and affect are appropriate Heart: RRR no murmur  Lungs: Clear to auscultation,no increased WOB Abdomen: Positive bowel sounds, soft nontender to palpation, nondistended Extremities: No clubbing, cyanosis, or edema Skin: No evidence of breakdown, no evidence of rash  Skin: No evidence of breakdown, no evidence of rash, calluses on knees  Neurologic:Alert and oriented x 3. Follows commands, Normal insight and awareness. Intact Memory. Normal language and speech. Cranial nerve exam unremarkable. MMT: Left-sided strength is normal RUE 4 prox to distal. RLE 0/5 HF, KE, KF and 0/5 distally. Sensory exam normal sensation to light touch and proprioception in bilateral upper and lower extremities  Musculoskeletal: Full range of motion in all 4 extremities. No joint swelling     Assessment/Plan: 1. Functional deficits which require 3+ hours per day of interdisciplinary therapy in a comprehensive inpatient rehab setting. Physiatrist is providing close team supervision and 24 hour management of active medical problems listed  below. Physiatrist and rehab team continue to assess barriers to discharge/monitor patient progress toward functional and medical goals  Care Tool:  Bathing    Body parts bathed by patient: Right arm, Left arm, Chest, Abdomen, Front perineal area, Right upper leg, Left upper leg, Left lower leg, Face   Body parts bathed by helper: Buttocks, Right lower leg     Bathing assist Assist Level: Minimal Assistance - Patient > 75%     Upper Body Dressing/Undressing Upper body dressing   What is the patient wearing?: Pull over shirt    Upper body assist Assist Level: Set up assist    Lower Body Dressing/Undressing Lower body dressing      What is the patient wearing?: Incontinence brief, Pants     Lower body assist Assist for lower body dressing: Minimal Assistance - Patient > 75%     Toileting Toileting    Toileting assist Assist for toileting: Moderate Assistance - Patient 50 - 74%     Transfers Chair/bed transfer  Transfers assist  Chair/bed transfer activity did not occur: N/A  Chair/bed transfer assist level: Minimal Assistance - Patient > 75%     Locomotion Ambulation   Ambulation  assist      Assist level: 2 helpers (mod A of 1 and +2 min A for safety) Assistive device: Walker-rolling Max distance: 156ft   Walk 10 feet activity   Assist  Walk 10 feet activity did not occur: Safety/medical concerns  Assist level: 2 helpers (mod A of 1 and +2 min A for safety) Assistive device: Walker-rolling   Walk 50 feet activity   Assist Walk 50 feet with 2 turns activity did not occur: Safety/medical concerns  Assist level: 2 helpers (mod A of 1 and +2 min A for safety) Assistive device: Walker-rolling    Walk 150 feet activity   Assist Walk 150 feet activity did not occur: Safety/medical concerns         Walk 10 feet on uneven surface  activity   Assist Walk 10 feet on uneven surfaces activity did not occur: Safety/medical concerns          Wheelchair     Assist Is the patient using a wheelchair?: Yes Type of Wheelchair: Manual    Wheelchair assist level: Supervision/Verbal cueing Max wheelchair distance: 171ft    Wheelchair 50 feet with 2 turns activity    Assist        Assist Level: Supervision/Verbal cueing   Wheelchair 150 feet activity     Assist      Assist Level: Supervision/Verbal cueing   Blood pressure (!) 117/92, pulse 75, temperature 98 F (36.7 C), resp. rate 18, height 5\' 9"  (1.753 m), weight 105.6 kg, SpO2 100 %.  Medical Problem List and Plan: 1. Functional deficits secondary to left ACA infarct due to left A2 occlusion status post TNK with stenting             -patient may  shower             -ELOS/Goals: 6/5, min assist goals with PT, OT, SLP  -pt with excellent return RUE but little return in RLE as expected   -Continue CIR therapies including PT, OT, and SLP   Team conference today please see physician documentation under team conference tab, met with team  to discuss problems,progress, and goals. Formulized individual treatment plan based on medical history, underlying problem and comorbidities.   2.  Antithrombotics: -DVT/anticoagulation:  amb >100' daily off heparin              -antiplatelet therapy: Aspirin 81 mg daily and Brilinta 90 mg twice daily 3. Pain Management: Tylenol as needed 4. Mood/Behavior/Sleep: Provide emotional support             -antipsychotic agents: N/A 5. Neuropsych/cognition: This patient is not capable of making decisions on his own behalf. 6. Skin/Wound Care: Routine skin checks 7. Fluids/Electrolytes/Nutrition: Routine in and outs with follow-up chemistries D/c CBG  CBG (last 3)  No results for input(s): "GLUCAP" in the last 72 hours.   8.  Seizure prophylaxis.  EEG negative.  Continue Keppra 500 mg daily 9.  AKI on CKD stage IV.  Follow-up chemistries.  Follow-up outpatient nephrology services             -pt given lokelma prior to  rehab admit             Nephro input appreciated 5/24  -recheck bmet again tomorrow  -will hold chlorthalidone for now  -check weight-ordered daily, I/O's fairly balanced  Filed Weights   08/14/22 1500 08/22/22 0543 08/23/22 0538  Weight: 99.3 kg 107.5 kg 105.6 kg        Latest  Ref Rng & Units 08/21/2022    5:51 AM 08/18/2022    5:28 AM 08/17/2022    9:45 AM  BMP  Glucose 70 - 99 mg/dL 161  096  75   BUN 6 - 20 mg/dL 045  409  811   Creatinine 0.61 - 1.24 mg/dL 9.14  7.82  9.56   Sodium 135 - 145 mmol/L 139  137  138   Potassium 3.5 - 5.1 mmol/L 4.7  4.8  5.0   Chloride 98 - 111 mmol/L 106  102  101   CO2 22 - 32 mmol/L 20  22  23    Calcium 8.9 - 10.3 mg/dL 9.2  9.6  21.3      10.  Hyperlipidemia.  Crestor 20mg  QD 11.  Permissive hypertension.  Patient on amlodipine 10 mg daily, hydralazine 50 mg twice daily, labetalol 200 mg twice daily prior to admission.  Resume as needed  -5/29 BP well controlled Vitals:   08/19/22 1321 08/19/22 1949 08/20/22 0552 08/20/22 1401  BP: (!) 126/92 115/64 112/66 127/87   08/20/22 1955 08/21/22 0517 08/21/22 1305 08/21/22 2034  BP: 121/81 119/80 (!) 128/95 126/86   08/22/22 0417 08/22/22 1338 08/22/22 1930 08/23/22 0536  BP: 119/84 137/86 121/89 (!) 117/92    12.  Medical noncompliance.  Provide counseling 13. Constipation/incontinence of bowel: -5/27 increase miralax to BID, 5/29 small BM yesterday but none x several days   LOS: 22 days A FACE TO FACE EVALUATION WAS PERFORMED  Erick Colace 08/23/2022, 8:16 AM

## 2022-08-23 NOTE — Progress Notes (Signed)
Patient resting comfortably on cpap.

## 2022-08-23 NOTE — Progress Notes (Addendum)
Physical Therapy Session Note  Patient Details  Name: Joseph Bright MRN: [id number] Date of Birth: 16-Apr-1975  Today's Date: 08/23/2022 PT Individual Time: 1101-1159 PT Individual Time Calculation (min): 58 min   Short Term Goals: Week 3:  PT Short Term Goal 1 (Week 3): Pt will perform supine<>sit with CGA consistently PT Short Term Goal 2 (Week 3): Pt will perform sit<>stands using LRAD with CGA PT Short Term Goal 3 (Week 3): Pt will perform bed<>chair transfers using LRAD with CGA PT Short Term Goal 4 (Week 3): Pt will ambulate at least 50ft in controlled environment using LRAD with min assist of 1 and +2 for safety as needed PT Short Term Goal 5 (Week 3): Pt will navigate 4 steps using HRs with min assist of 1 and +2 for safety as needed  Skilled Therapeutic Interventions/Progress Updates:      Pt seated in WC upon entry. Pt agreeable to therapy. Pt denies any pain.   Pt self propelled wheelchair to room with L UE and LE with mod I. Pt performed stand pivot transfer x1 to R with mod A, x1 to L with min A, with RW, and therapist assisting with positioning R LE.   Pt performed 1x3 sit<>stand with RW and CGA with pt heavily favoring L LE. Pt perofrmed 1x10 sit <>stand from mat table with RW and airex placed under L LE, with mod A, with therapist providing approximation of R LE to facilitate R LE weight shift.   Pt ambulated with 3 musketeers 3x60 feet with R LE AFO and foot cover over forefoot to reduce friction with min A, verbal and tactile cues provided for weight shift and reciprocal gait, with therapist stabilizing R LE during stance.   Pt ascended/descended 2x4 6 inch steps with B UE support and step to gait ascending leading with L LE, descending leading with R LE and mod A with +2 for safety, with therapist assisting with advancement of R LE, and stabilizing R LE during stance.   Pt transported dependent in [facility] to room for time conservation. Pt seated in [facility] with all needs within  reach and chair alarm on at end of session.   Therapy Documentation Precautions:  Precautions Precautions: Fall Precaution Comments: right sided hemiparesis, expressive deficits Restrictions Weight Bearing Restrictions: No Therapy/Group: Individual Therapy  Ambrose Finland 08/23/2022, 7:55 AM

## 2022-08-23 NOTE — Progress Notes (Signed)
Occupational Therapy Weekly Progress Note  Patient Details  Name: Joseph Bright MRN: [id number] Date of Birth: July 16, 1975  Beginning of progress report period: Aug 16, 2022 End of progress report period: Aug 23, 2022  Today's Date: 08/23/2022 OT Individual Time: 4098-1191 OT Individual Time Calculation (min): 72 min  Today's Date: 08/23/2022 PT Co-Treatment Time: 4782-9562 (total co-tx time from 1307-1330) PT Co-Treatment Time Calculation (min): 10 min  Patient has met 2 of 3 short term goals.  Pt is demonstrating improved use of RUE and using during functional tasks ~50% of time with min VB cueing. Pt is able to perform sit>stands with min A with LUE support and is progressing towards CGA. Pt consistently using hemi-dressing/bathing techniques during BADLs with min-mod verbal cueing. Pt is consistently performing squat pivot transfers to toilet and shower seat with min A and is demonstrating increased independence in coaching caregivers during these transfers. Pt is supported by his family members and significant other who visit often, inquire about his progress, and have participated in in-depth family education sessions. Additional family education is recommended prior to Pt d/c to increase safety and support carryover. Pt will be d/c'ing home to 1st level apartment with family support. Pt is performing BADLs at levels listed below.   Patient continues to demonstrate the following deficits: muscle weakness and muscle paralysis, decreased cardiorespiratoy endurance, abnormal tone, unbalanced muscle activation, decreased coordination, and decreased motor planning, decreased attention to right, decreased problem solving and decreased safety awareness, central origin, and decreased sitting balance, decreased standing balance, decreased postural control, hemiplegia, and decreased balance strategies and therefore will continue to benefit from skilled OT intervention to enhance overall performance with  BADL, iADL, Vocation, and Reduce care partner burden.  Patient progressing toward long term goals..  Continue plan of care.  OT Short Term Goals Week 3:  OT Short Term Goal 1 (Week 3): Pt will utilize RUE during functional tasks 50% of time with supervision OT Short Term Goal 1 - Progress (Week 3): Met OT Short Term Goal 2 (Week 3): Pt wil complete squat pivot toilet transfers min A consistently using LRAD OT Short Term Goal 2 - Progress (Week 3): Met OT Short Term Goal 3 (Week 3): Pt will complete sit>stands CGA consistently OT Short Term Goal 3 - Progress (Week 3): Progressing toward goal Week 4:  OT Short Term Goal 1 (Week 4): STG=LTG d/t ELOS  Skilled Therapeutic Interventions/Progress Updates:     AM Session:  Pt received semi-reclined in bed presenting to be in good spirits receptive to skilled OT session reporting 0/10 pain- OT offering intermittent rest breaks, repositioning, and therapeutic support to optimize participation in therapy session. Pt requesting to take shower this AM with session focused on BADL retraining, re-education on hemi-dressing techniques, and activity tolerance. Pt transitioned to EOB with supervision and increased time using bed rails and modified technique. Partial stand pivot to L EOB>wc CGA with OT positioning RLE prior to transfer. Transported Pt to bathroom total A in wc. Partial stand pivot wc>toilet using grab bars light min A with OT stabilizing and facilitating RLE during transfer. Pt able to doff pants from waist in standing with OT providing min A for balance. Provided increased time on toilet d/t Pt reporting need for BM, however no success at this time (continent void documented in flowsheets). Pt able to maintain standing balance with light min A while performing posterior peri-care. Partial stand pivot using grab bars min A toilet>wc. Educated Pt on set-up for TTB transfer to  walk-in shower with Pt able to use hemi wc propulsion technique to position  self for transfer with min VB cueing required. Partial stand pivot to TTB usign grab bars light min A. Pt doffed shirt in sitting supervision and stood to doff pants with CGA while holding grab bars. Educated pt on need to use RUE during bathing tasks with increased time provided to support motor planning and problem solving during bathing for RUE. Pt able to utilize during >50% of task progressing to 75% with increased verbal cueing for motivation and technique. Pt able to complete UB bathing supervision and LB bathing min A for washing his bottom using long handled sponge for lower BLEs and feet. Partial stand pivot TTB>wc using grab bars CGA. Pt completed dressing tasks seated in wc donning shirt with supervision using hemi dressing technique with mod VB cues required. Pt able to weave feet into pants by crossing L LE behind the R LE to boost it off the floor and reaching forward to weave R foot first and then L foot. Stood with CGA using LUE positioned on bed rail and brought pants to waist with min A to fully bring over R hip. Pt able to position himself at sink and complete grooming/hygiene tasks with supervision with min verbal cues required to use RUE 50% during task. Pt was left resting in wc with call bell in reach and all needs met.    PM Session:  Pt received sitting up in wc with wc consultant, Joseph Bright, present in room. Pt presenting to be in good spirits receptive to skilled OT session reporting 0/10 pain- OT offering intermittent rest breaks, repositioning, and therapeutic support to optimize participation in therapy session. PT, Joseph Bright, arriving for co-treat during session upon OT arrival. Beginning of session focused on completing collaborative custom wc evaluation to provide insight for wc features to optimize Pt's safety and independence in completing BADLs, IADLs, and functional transfers. Following wc evaluation, Pt able to self-propel wc to therapy gym for endurance and functional mobility  training using hemi-technique with min verbal cues required for safety. In therapy gym, provided skilled +2 assistance during functional mobility training with PT with this therapist providing CGA and mod tactile cues for weight shifting/body awareness while Pt ambulated using RW and RUE hand splint. Pt was handed off to PT for remainder of individual treatment session with all needs met.    Therapy Documentation Precautions:  Precautions Precautions: Fall Precaution Comments: right sided hemiparesis, expressive deficits Restrictions Weight Bearing Restrictions: No ADL:  ADL Equipment Provided: Long-handled sponge Eating: Set up Where Assessed-Eating: Wheelchair Grooming: Supervision/safety Where Assessed-Grooming: Sitting at sink Upper Body Bathing: Supervision/safety Where Assessed-Upper Body Bathing: Shower Lower Body Bathing: Minimal assistance Where Assessed-Lower Body Bathing: Shower Upper Body Dressing: Supervision/safety Where Assessed-Upper Body Dressing: Wheelchair Lower Body Dressing: Contact guard Where Assessed-Lower Body Dressing: Wheelchair Toileting: Minimal assistance, Moderate assistance Where Assessed-Toileting: Teacher, adult education: Curator Method: Stand pivot, Ambulance person: Engineer, technical sales: International aid/development worker Method: Squat pivot, Stand pivot Tub/Shower Equipment: Acupuncturist: Insurance underwriter Method: Squat pivot, Stand pivot [employer]: Transfer tub bench   Therapy/Group: Individual Therapy  Army Fossa 08/23/2022, 10:29 AM

## 2022-08-23 NOTE — Progress Notes (Signed)
Physical Therapy Weekly Progress Note  Patient Details  Name: Joseph Bright MRN: [id number] Date of Birth: Jan 25, 1976  Beginning of progress report period: Aug 16, 2022 End of progress report period: Aug 23, 2022  Today's Date: 08/23/2022 PT Individual Time: 1330-1416 PT Individual Time Calculation (min): 46 min   and  Today's Date: 08/23/2022 PT Co-Treatment Time: 1318-1330 (total co-tx time from 1307-1330) PT Co-Treatment Time Calculation (min): 12 min  Patient has met 1 of 5 short term goals. Mr. [place]ville is continuing to make steady progress with functional mobility; however, continues to have significant paresis with gradual return in R LE impacting his balance and progress. He is performing supine<>sit with occasional min assist from flat bed using bedrails, sit<>stands with CGA/min assist, bed<>chair transfers with CGA/min A, and L hemi-technique wheelchair mobility with supervision. He is participating in gait training up to 154ft progressed to using a RW with +2 for safety and therapist providing mod assist for R LE management, primarily to assist with swing phase advancement. Family education/training was started, but will require additional training prior to D/C. Family arranging 1st floor, level entry apartment at D/C. Mr. Colton will benefit from continued CIR level skilled physical therapy prior to D/Cing home with 24hr assistance from family.  Patient continues to demonstrate the following deficits muscle weakness, muscle joint tightness, and muscle paralysis, decreased cardiorespiratoy endurance, impaired timing and sequencing, abnormal tone, and unbalanced muscle activation, and decreased sitting balance, decreased standing balance, decreased postural control, hemiplegia, and decreased balance strategies and therefore will continue to benefit from skilled PT intervention to increase functional independence with mobility.  Patient progressing toward long term goals..  Continue plan of  care.  PT Short Term Goals Week 3:  PT Short Term Goal 1 (Week 3): Pt will perform supine<>sit with CGA consistently PT Short Term Goal 1 - Progress (Week 3): Progressing toward goal PT Short Term Goal 2 (Week 3): Pt will perform sit<>stands using LRAD with CGA PT Short Term Goal 2 - Progress (Week 3): Progressing toward goal PT Short Term Goal 3 (Week 3): Pt will perform bed<>chair transfers using LRAD with CGA PT Short Term Goal 3 - Progress (Week 3): Partly met (CGA squat pivots) PT Short Term Goal 4 (Week 3): Pt will ambulate at least 60ft in controlled environment using LRAD with min assist of 1 and +2 for safety as needed PT Short Term Goal 4 - Progress (Week 3): Progressing toward goal PT Short Term Goal 5 (Week 3): Pt will navigate 4 steps using HRs with min assist of 1 and +2 for safety as needed PT Short Term Goal 5 - Progress (Week 3): Progressing toward goal Week 4:  PT Short Term Goal 1 (Week 4): = to LTGs based on ELOS    Skilled Therapeutic Interventions/Progress Updates:  Ambulation/gait training;Balance/vestibular training;Cognitive remediation/compensation;Community reintegration;Discharge planning;Disease management/prevention;DME/adaptive equipment instruction;Functional electrical stimulation;Functional mobility training;Neuromuscular re-education;Pain management;Patient/family education;Psychosocial support;Skin care/wound management;Splinting/orthotics;Stair training;Therapeutic Activities;Therapeutic Exercise;UE/LE Strength taining/ROM;UE/LE Coordination activities;Visual/perceptual remediation/compensation;Wheelchair propulsion/positioning    Pt received sitting in w/c with Fleet Contras, [employer] from [employer] present, for wheelchair consultation. Pt agreeable to therapy session. 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 Lawson Fiscal, OT present at beginning of session for co-treat to discuss wheelchair needs.  Discussed that pt will  need the following for his custom wheelchair:  - K5 ultra-lightweight w/c with adjustable axle to improve accessibility to rim for better biomechanics to protect his shoulders - gel-infused foam cushion  for pressure relief while providing a low-maintenance cushion - contoured hard-shell back support as pt uncomfortable with current sling back, limiting sitting tolerance - low floor-to-seat height to improve L foot contact on ground for hemi-propulsion - plastic coated rim for improved grip to increase propulsion ability - pt declines receiving lap tray for R UE support - extension handles on brakes to allow pt independence with R brake management   L hemi-propulsion ~165ft to main therapy gym with supervision - pt continuing to perform small push-strokes with L UE to coordinate with pull/direction of L LE to drive wheelchair in correct direction - discussed with ATP that pt would likely benefit from shorter floor to seat height to improve L LE contact with floor.  Sit<>stands w/c<>RW with CGA for steadying throughout session - pt placing R hand on/off RW orthosis with min cuing.  Gait training 148ft + 164ft using RW with +2 providing CGA progressed to close supervision for balance safety and therapist providing mod assist for R LE management, AD management, and balance. Pt demonstrating the following gait deviations with therapist providing the described cuing and facilitation for improvement:  - therapist sitting on stool to allow improved access to RLE  - donned shoe cover as toe cap on R side for improved swing phase advancement - wearing R LE [place] Walk-on PLS AFO - advances R LE >25% of the way consistently with occasionally advancement ~50%, but initially advances it >75% until fatigued after initial ~30-59ft on 1st gait trial - continues to keep R LE slightly flexed during stance or moves quickly back into full extension (thrusting backwards) causing quick forward movement of his body  and quick L step forward - achieves reciprocal stepping pattern >75% of the time -  requires assist for AD management primarily with turning to pull RW back on R side but does well managing it in straight pathways or gradual turns until fatigued   Progressed to gait training using RW 123ft x2 with only mod assist from therapist and no +2 assist as this therapist is now familiar with pt's movement patterns and how to skillfully assist him managing R LE and anticipating potential LOBs. Pt continues to require manual facilitation/assist as described above.  Stair navigation training ascending/descending 8 steps (6" height) using L UE support on each HR with mod assist of 1 for R LE management  ASCENT: - cuing for reciprocal stepping pattern for R LE NMR  - pt able to initiate advancing R LE onto next step but still requires total assist  - requires max/total assist to manually guard/block R knee during ascent but pt does demo ability to power up into hip/knee extension at end of the movement  DESCENT:  - cued for step-to pattern leading with R LE for safety and due to pt reporting R knee pain upon 1 attempt at reciprocal pattern so deferred further  - requires total assist to advance R LE onto next step - pt able to maintain R hip/knee extension during stance while bringing L LE down onto the step    Transported back to his room and pt reporting need to use bathroom.   Notified NT and had her present to discussed plan for pt progressively not using stedy lift since pt will not have this at home. Discussed having pt perform sit>stand from w/c with footboard on his L for balance stability while using urinal to void bladder (still recommended pt using stedy for BM at this time) - pt able to complete  this with light min assist for balance including managing LB clothing and urinal. NT reports feeling comfortable performing this with pt in future.  Discussed performing squat pivot transfers w/c<>EOB  with therapist educating NT on proper set-up of transfer, assisting pt with positioning R foot, and how to use gait belt to safely assist him with the transfer. Therapist demonstrated R squat pivot and then NT assisted with L squat pivot back to w/c. Both report feeling comfortable using this transfer technique with nursing staff.   At end of session, pt left seated in w/c with needs in reach and NT present.    Therapy Documentation Precautions:  Precautions Precautions: Fall Precaution Comments: right sided hemiparesis, expressive deficits Restrictions Weight Bearing Restrictions: No   Pain:  Denies pain during session, except for 1 attempt at reciprocal stepping pattern on descent during stair navigation so deferred further attempts.   Therapy/Group: Individual Therapy and Co-Treatment  Ginny Forth , PT, DPT, NCS, CSRS 08/23/2022, 9:16 AM

## 2022-08-24 NOTE — Progress Notes (Signed)
Patient ID: Joseph Bright, male   DOB: [birth year], 47 y.o.   MRN: [id number]  OP referral sent to Neuro rehab.

## 2022-08-24 NOTE — Progress Notes (Signed)
   08/24/22 0300  BiPAP/CPAP/SIPAP  IPAP  (auto16)  EPAP  (5-16)  FiO2 (%) 21 %  Patient Home Equipment No

## 2022-08-24 NOTE — Progress Notes (Signed)
Occupational Therapy Session Note  Patient Details  Name: Joseph Bright MRN: [id number] Date of Birth: [birth year]  Today's Date: 08/24/2022 OT Individual Time: 0800-0900 OT Individual Time Calculation (min): 60 min  OT Individual Time: 1445-1515 OT Individual Time Calculation (min): 30 min    Short Term Goals: Week 4:  OT Short Term Goal 1 (Week 4): STG=LTG d/t ELOS  Skilled Therapeutic Interventions/Progress Updates:     AM Session: Pt received reclined in bed presenting to be in good spirits receptive to skilled OT session reporting 0/10 pain- OT offering intermittent rest breaks, repositioning, and therapeutic support to optimize participation in therapy session. Focus this session BADL retraining and UB NMR.   Pt transitioned to EOB with light min A to lift trunk and min verbal cueing for modified technique. Completed dressing tasks seated EOB donning shirt with supervision using hemi-dressing technique. Re-educated Pt on LB hemi-dressing techniques and engaged Pt in conversation on importance of giving himself increased time to complete tasks and ensuring his RLE is in a safe position throughout to prevent injury with Pt receptive to education. Pt able to weave feet into pants using hemi-technique and stand with his RW min A to bring pants to waist with assistance required to bring pants up to hip in his R side. Partial stand pivot EOB>wc light min A with OT providing tactile cues and facilitation on RLE. RN in/out during session to provide pain medications.   Pt requesting to use restroom. Transported Pt total A to bathroom in wc for time management. Engaged Pt in simulated family training with OT role playing to be Pt's caregiver and Pt providing instructions of how "caregiver" can assist him during transfer and toileting. Pt able to verbalize for "caregiver" to keep his R knee in alignment, assist him with balance, and place RW in front of him for balance when performing clothing  management. Pt able to maintain balance with CGA during task. Partial stand pivot toilet>wc using grab bars light min A.   Pt positioned in front of sink for grooming/hygiene tasks. Mod VB cues required for Pt to to utilize RUE during tasks as Pt prefers to use LUE to compensate for deficits. With VB cueing, Pt able to utilize RUE to open/close toothpaste tube, mouth wash bottle, and hold tooth brush with increased time required for motor planning and problem solving.   Pt propelled his wc to therapy gym for functional mobility and endurance training. Pt able to complete with min VB cues for awareness to avoid obstacles. Pt able to set up wc for transfer and completed partial stand pivot with light min A.  Engaged Pt in UB NMR activities with functional over head and cross body PNF reaching incorporated into task to improve VM coordination, increase speed of RUE muscle activation, and improve overall functional endurance and strength for BADLs. Donned 2.5# wrist weights on BUEs to increase proprioceptive feedback and provide moderate resistance while Pt completed targeted punches. Pt able to tolerate completing punches ~1.5 minutes x3 trials with rest breaks provided between trials.  Squat pivot EOM>wc light min A. Pt able to propel wc back to room with supervision. Placed socks on Pt's L hand at end of session to facilitate forced use of RUE during functional tasks in between therapy session with education provided on purpose and Pt receptive. Pt was left resting in wc with call bell in reach and all needs met.    PM Session:  Pt received sitting up in wc presenting to be in  good spirits receptive to skilled OT session reporting 0/10 pain- OT offering intermittent rest breaks, repositioning, and therapeutic support to optimize participation in therapy session. Pt's caregiver, Tab, present in room upon OT arrival motivated to participate in family education focused session. Engaged Pt and caregiver in  conversation discussing DME recommendations, home-set up, and accessibility with caregiver confirming Pt will be in ground level apartment that is wc/RW accessible with grab bars installed around toilet and shower (walk-in and tub options). Pt and caregiver receptive to using TTB and BSC at d/c. Pt's caregiver planning to bring in photos of new apartment during next family education session. Remainder of session focused on massed practice of wc>bed tranfers to increase safety and conform with completing transfers at d/c. Education provided on technique, body mechanics, safety, and set-up. Provided demonstration and opportunity for Pt's caregiver to assist for hand's on training with skilled skilled feedback provided following. Pt and caregiver were able to safety complete partial stand pivot transfers wc<>EOB x4 trials with min VB cueing required for technique. Plan to continue family education during u[upcoming therapy sessions next week. Pt and caregiver receptive to all education reporting all questions were met. Pt was left resting in c with call bell in reach and all needs met.    Therapy Documentation Precautions:  Precautions Precautions: Fall Precaution Comments: right sided hemiparesis, expressive deficits Restrictions Weight Bearing Restrictions: No   Therapy/Group: Individual Therapy  Army Fossa 08/24/2022, 7:59 AM

## 2022-08-24 NOTE — Progress Notes (Signed)
Physical Therapy Session Note  Patient Details  Name: Joseph Bright MRN: [id number] Date of Birth: Oct 11, 1975  Today's Date: 08/24/2022 PT Individual Time: 1117-1158 PT Individual Time Calculation (min): 41 min   Short Term Goals: Week 4:  PT Short Term Goal 1 (Week 4): = to LTGs based on ELOS  Skilled Therapeutic Interventions/Progress Updates:    Pt presents in day room for attending dance group and agreeable to PT session. Session focused on therapeutic activity and NMR in group dance setting to promote BUE/BLE coordination and mirroring techniques to facilitate muscle fiber recruitment needed for functional transfers and gait. Therapist provides tactile cues for desired muscular activation as well as manual facilitation for utilizing RLE during dance moves while mirroring dance group leader. Movements included reciprocal BUE/BLE movements, controlling increasing and decreasing speeds with movements, trunk rotation as well as anterior/posterior weightshifting. Pt maintains activity throughout full session for a total of 6 songs. Pt self propels with L hemi technique from dayroom to room, verbal cues for positioning and managing obstacles in narrow spaces with supervision. Pt remains seated with all needs within reach, call light in place at end of session.  Therapy Documentation Precautions:  Precautions Precautions: Fall Precaution Comments: right sided hemiparesis, expressive deficits Restrictions Weight Bearing Restrictions: No  Therapy/Group: Individual Therapy  Edwin Cap PT, DPT 08/24/2022, 12:41 PM

## 2022-08-24 NOTE — Progress Notes (Signed)
PROGRESS NOTE   Subjective/Complaints:  No issues overnite , discussed forced use of RUE to encourage motor control recovery   ROS: Patient denies CP, SOB, abdominal pain,  N/V/D.      Objective:   No results found. No results for input(s): "WBC", "HGB", "HCT", "PLT" in the last 72 hours.    No results for input(s): "NA", "K", "CL", "CO2", "GLUCOSE", "BUN", "CREATININE", "CALCIUM" in the last 72 hours.    Intake/Output Summary (Last 24 hours) at 08/24/2022 0844 Last data filed at 08/24/2022 0800 Gross per 24 hour  Intake 712 ml  Output 1325 ml  Net -613 ml         Physical Exam: Vital Signs Blood pressure 111/86, pulse 78, temperature 98 F (36.7 C), resp. rate 17, height 5\' 9"  (1.753 m), weight 103.3 kg, SpO2 100 %.    General: No acute distress, sitting in WC, appears comfortable  Mood and affect are appropriate Heart: RRR no murmur  Lungs: Clear to auscultation,no increased WOB Abdomen: Positive bowel sounds, soft nontender to palpation, nondistended Extremities: No clubbing, cyanosis, or edema Skin: No evidence of breakdown, no evidence of rash  Skin: No evidence of breakdown, no evidence of rash, calluses on knees  Neurologic:Alert and oriented x 3. Follows commands, Normal insight and awareness. Intact Memory. Normal language and speech. Cranial nerve exam unremarkable. MMT: Left-sided strength is normal RUE 4 prox to distal. RLE 0/5 HF, KE, KF and 0/5 distally. Sensory exam normal sensation to light touch and proprioception in bilateral upper and lower extremities  Musculoskeletal: Full range of motion in all 4 extremities. No joint swelling     Assessment/Plan: 1. Functional deficits which require 3+ hours per day of interdisciplinary therapy in a comprehensive inpatient rehab setting. Physiatrist is providing close team supervision and 24 hour management of active medical problems listed  below. Physiatrist and rehab team continue to assess barriers to discharge/monitor patient progress toward functional and medical goals  Care Tool:  Bathing    Body parts bathed by patient: Right arm, Left arm, Chest, Abdomen, Front perineal area, Right upper leg, Left upper leg, Left lower leg, Face   Body parts bathed by helper: Buttocks, Right lower leg     Bathing assist Assist Level: Minimal Assistance - Patient > 75%     Upper Body Dressing/Undressing Upper body dressing   What is the patient wearing?: Pull over shirt    Upper body assist Assist Level: Set up assist    Lower Body Dressing/Undressing Lower body dressing      What is the patient wearing?: Incontinence brief, Pants     Lower body assist Assist for lower body dressing: Minimal Assistance - Patient > 75%     Toileting Toileting    Toileting assist Assist for toileting: Moderate Assistance - Patient 50 - 74%     Transfers Chair/bed transfer  Transfers assist  Chair/bed transfer activity did not occur: N/A  Chair/bed transfer assist level: Contact Guard/Touching assist (squat pivot)     Locomotion Ambulation   Ambulation assist      Assist level: Moderate Assistance - Patient 50 - 74% Assistive device: Walker-rolling Max distance: 121ft   Walk  10 feet activity   Assist  Walk 10 feet activity did not occur: Safety/medical concerns  Assist level: Moderate Assistance - Patient - 50 - 74% Assistive device: Walker-rolling, Orthosis   Walk 50 feet activity   Assist Walk 50 feet with 2 turns activity did not occur: Safety/medical concerns  Assist level: Moderate Assistance - Patient - 50 - 74% Assistive device: Walker-rolling, Orthosis    Walk 150 feet activity   Assist Walk 150 feet activity did not occur: Safety/medical concerns         Walk 10 feet on uneven surface  activity   Assist Walk 10 feet on uneven surfaces activity did not occur: Safety/medical  concerns         Wheelchair     Assist Is the patient using a wheelchair?: Yes Type of Wheelchair: Manual    Wheelchair assist level: Supervision/Verbal cueing Max wheelchair distance: 187ft    Wheelchair 50 feet with 2 turns activity    Assist        Assist Level: Supervision/Verbal cueing   Wheelchair 150 feet activity     Assist      Assist Level: Supervision/Verbal cueing   Blood pressure 111/86, pulse 78, temperature 98 F (36.7 C), resp. rate 17, height 5\' 9"  (1.753 m), weight 103.3 kg, SpO2 100 %.  Medical Problem List and Plan: 1. 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  Antithrombotics: -DVT/anticoagulation:  amb >100' daily off heparin              -antiplatelet therapy: Aspirin 81 mg daily and Brilinta 90 mg twice daily 3. Pain Management: Tylenol as needed 4. Mood/Behavior/Sleep: Provide emotional support             -antipsychotic agents: N/A 5. Neuropsych/cognition: This patient is not capable of making decisions on his own behalf. 6. Skin/Wound Care: Routine skin checks 7. Fluids/Electrolytes/Nutrition: Routine in and outs with follow-up chemistries D/c CBG  CBG (last 3)  No results for input(s): "GLUCAP" in the last 72 hours.   8.  Seizure prophylaxis.  EEG negative.  Continue Keppra 500 mg daily 9.  AKI on CKD stage IV.  Follow-up chemistries.  Follow-up outpatient nephrology services             -pt given lokelma prior to rehab admit             Nephro input appreciated 5/24  -recheck bmet again tomorrow  -will hold chlorthalidone for now  -check weight-ordered daily, I/O's fairly balanced  Filed  Weights   08/22/22 0543 08/23/22 0538 08/24/22 0644  Weight: 107.5 kg 105.6 kg 103.3 kg        Latest Ref Rng & Units 08/21/2022    5:51 AM 08/18/2022    5:28 AM 08/17/2022    9:45 AM  BMP  Glucose 70 - 99 mg/dL 132  440  75   BUN 6 - 20 mg/dL 102  725  366   Creatinine 0.61 - 1.24 mg/dL 4.40  3.47  4.25  Sodium 135 - 145 mmol/L 139  137  138   Potassium 3.5 - 5.1 mmol/L 4.7  4.8  5.0   Chloride 98 - 111 mmol/L 106  102  101   CO2 22 - 32 mmol/L 20  22  23    Calcium 8.9 - 10.3 mg/dL 9.2  9.6  16.1      10.  Hyperlipidemia.  Crestor 20mg  QD 11.  Permissive hypertension.  Patient on amlodipine 10 mg daily, hydralazine 50 mg twice daily, labetalol 200 mg twice daily prior to admission.  Resume as needed  -5/30 BP well controlled Vitals:   08/20/22 1401 08/20/22 1955 08/21/22 0517 08/21/22 1305  BP: 127/87 121/81 119/80 (!) 128/95   08/21/22 2034 08/22/22 0417 08/22/22 1338 08/22/22 1930  BP: 126/86 119/84 137/86 121/89   08/23/22 0536 08/23/22 1418 08/23/22 2003 08/24/22 0500  BP: (!) 117/92 (!) 126/95 122/81 111/86    12.  Medical noncompliance.  Provide counseling 13. Constipation/incontinence of bowel: -5/27 increase miralax to BID, 5/29 small BM yesterday but none x several days   LOS: 23 days A FACE TO FACE EVALUATION WAS PERFORMED  Erick Colace 08/24/2022, 8:44 AM

## 2022-08-24 NOTE — Progress Notes (Signed)
Physical Therapy Session Note  Patient Details  Name: Joseph Bright MRN: [id number] Date of Birth: 05-31-1975  Today's Date: 08/24/2022 PT Individual Time: 1307-1400 PT Individual Time Calculation (min): 53 min   Short Term Goals: Week 4:  PT Short Term Goal 1 (Week 4): = to LTGs based on ELOS  Skilled Therapeutic Interventions/Progress Updates:    Pt received sitting in w/c with plan for hands-on family education/training today. Pt agreeable to therapy session; however, family not present. Pt reports need to use bathroom. Sit>stand w/c>LUE support on footboard with CGA/light min assist - standing with CGA/light min assist for steadying while pt performed LB clothing management and managed urinal without assist - continent of bladder.  Pt called [name] and unfortunately she will not be arriving until after PT session - will notify SW and ask to reschedule family education/training as able.  Pt's loaner wheelchair from [place] had been delivered to his room. L stand pivot to new w/c with min assist and cuing for increased R LE WBing and weight shifting to truly step L LE rather than compensating with all weight through L LE, but pt still using strong compensatory strategies.  L hemi-technique w/c propulsion ~143ft to main therapy gym with distant supervision progressing to mod-I. Pt with improved propulsion technique with greater floor contact with L foot. Pt attempting to incorporate R hand into propulsion, but tends to just let it rub along wheel rim.   Therapist adjusted length of w/c leg rests to avoid pt sitting with excessive hip flexion. Noted that the brake extenders hit the w/c leg rests when taking them on/off making it more difficult to don/doff - notified [employer]; however, don't think anything can be done to modify this.   Sit<>stands w/c<>RW with pt placing R hand on/off orthosis with intermittent min verbal cuing, but no hands-on assist and only CGA for steadying/balance.  Pt  wearing R LE [place] Walk-on PLS AFO. Donned shoe toe-cap for improved swing phase advancement.  Gait training 182ft + 279ft using RW with light mod assist from therapist primarily for R LE swing phase advancement (therapist sitting on stool due to pt's height). Pt demonstrating the following gait deviations with therapist providing the described cuing and facilitation for improvement:  - pt demos improved balance to maintain upright with only manual cuing for increased L weight shift during L stance to improve ability to advance R swing  - intermittently pt will compensate by vaulting on L (heel raise) to improve R foot clearance during swing advancement; however, corrects when cued  - pt advances R LE ~50% of the way ~50% of the time, improving, but continuing to require consistent mod assist for advancement to achieve reciprocal stepping pattern - continues to have good R knee control during stance without assist, although keeps it partially flexed so intermittent tactile cuing to quads for increased activation - pt able to progress to performing more turns during 2nd gait trial (4 turns through doorways) with therapist educating on importance of ensuring adequate R LE swing phase advancement and proper foot placement when turning - pt also improving in ability to turn and step back to w/c at end of each gait cycle, giving therapist more time to facilitate R LE step  L hemi-technique w/c propulsion back to room as described above. Pt left seated in w/c in care of SLP.   Therapy Documentation Precautions:  Precautions Precautions: Fall Precaution Comments: right sided hemiparesis, expressive deficits Restrictions Weight Bearing Restrictions: No   Pain:  No  reports of pain throughout session.    Therapy/Group: Individual Therapy  Ginny Forth , PT, DPT, NCS, CSRS 08/24/2022, 1:03 PM

## 2022-08-24 NOTE — Progress Notes (Signed)
Speech Language Pathology Weekly Progress and Session Note  Patient Details  Name: Joseph Bright MRN: [id number] Date of Birth: 10-15-1975  Beginning of progress report period: Aug 17, 2022 End of progress report period: Aug 24, 2022  Today's Date: 08/24/2022 SLP Individual Time: 1400-1445 SLP Individual Time Calculation (min): 45 min  Short Term Goals: Week 3: SLP Short Term Goal 1 (Week 3): Pt will utilize compensatory word-finding strategies at the sentence level to repair communication breakdowns with 75% accuracy given  min cues. SLP Short Term Goal 1 - Progress (Week 3): Met SLP Short Term Goal 2 (Week 3): Pt will verbally respond to open ended wh questions with 80% acc given min A SLP Short Term Goal 2 - Progress (Week 3): Met SLP Short Term Goal 3 (Week 3): Pt will produce 5-6 word sentences with 75% acc given min verbal cues SLP Short Term Goal 3 - Progress (Week 3): Met SLP Short Term Goal 4 (Week 3): Pt will use written communication at the word level to repair communication breakdowns with 75% acc given min A SLP Short Term Goal 4 - Progress (Week 3): Met    New Short Term Goals: Week 4: SLP Short Term Goal 1 (Week 4): STGs= LTGs due to ELOS  Weekly Progress Updates: Patient has made steady gains and has met 4 of 4 STG's this reporting period due to improved ability to answer wh questions, expand length of utternaces, and use written comunication. Currently, pt continues to require min A for expressive language. Pt/ family education ongoing. Pt would benefit from continued skilled SLP intervention to maximize expressive language in order to maximize his functional independence prior to discharge.   Intensity: Minumum of 1-2 x/day, 30 to 90 minutes Frequency: 1 to 3 out of 7 days Duration/Length of Stay: June 6 Treatment/Interventions: Patient/family education;Therapeutic Activities;Cueing hierarchy;Functional tasks;Speech/Language facilitation;Multimodal communication  approach   Daily Session  Skilled Therapeutic Interventions: Pt was seen in PM to address expressive language. Pt was alert and seated upright in WC upon SLP arrival. SLP engaged pt in functional communication with focus on more complex questions. Pt verbalized speech felt better today because wasn't tired. Pt able to respond to complex questions including; answering questions about his children's hobbies, verbalizing geographical locations, and work descriptions. Occasional communication breakdowns with pt able to repair communication breakdown with sup to min A. Pt's wife arriving during session. SLP reviewed examples of activities to facilitate expressive language. SLP answered questions and provided insight as requested. Pt was left seated upright in WC with call button within reach and chair alarm active. SLP to continue POC.     Pain Pain Assessment Pain Scale: 0-10 Pain Score: 0-No pain  Therapy/Group: Individual Therapy  Renaee Munda 08/24/2022, 4:48 PM

## 2022-08-25 NOTE — Progress Notes (Signed)
PROGRESS NOTE   Subjective/Complaints:  No problems overnite   ROS: Patient denies CP, SOB, abdominal pain,  N/V/D.      Objective:   No results found. No results for input(s): "WBC", "HGB", "HCT", "PLT" in the last 72 hours.    No results for input(s): "NA", "K", "CL", "CO2", "GLUCOSE", "BUN", "CREATININE", "CALCIUM" in the last 72 hours.    Intake/Output Summary (Last 24 hours) at 08/25/2022 0725 Last data filed at 08/24/2022 2226 Gross per 24 hour  Intake 1080 ml  Output 1000 ml  Net 80 ml         Physical Exam: Vital Signs Blood pressure 110/75, pulse 67, temperature 98.1 F (36.7 C), resp. rate 16, height 5\' 9"  (1.753 m), weight 103.3 kg, SpO2 100 %.    General: No acute distress, sitting in WC, appears comfortable  Mood and affect are appropriate Heart: RRR no murmur  Lungs: Clear to auscultation,no increased WOB Abdomen: Positive bowel sounds, soft nontender to palpation, nondistended Extremities: No clubbing, cyanosis, or edema Skin: No evidence of breakdown, no evidence of rash  Skin: No evidence of breakdown, no evidence of rash, calluses on knees  Neurologic:Alert and oriented x 3. Follows commands, Normal insight and awareness. Intact Memory. Normal language and speech. Cranial nerve exam unremarkable. MMT: Left-sided strength is normal RUE 4 prox to distal. RLE 0/5 HF, KE, KF and 0/5 distally. Sensory exam normal sensation to light touch and proprioception in bilateral upper and lower extremities  Musculoskeletal: Full range of motion in all 4 extremities. No joint swelling     Assessment/Plan: 1. Functional deficits which require 3+ hours per day of interdisciplinary therapy in a comprehensive inpatient rehab setting. Physiatrist is providing close team supervision and 24 hour management of active medical problems listed below. Physiatrist and rehab team continue to assess barriers to  discharge/monitor patient progress toward functional and medical goals  Care Tool:  Bathing    Body parts bathed by patient: Right arm, Left arm, Chest, Abdomen, Front perineal area, Right upper leg, Left upper leg, Left lower leg, Face   Body parts bathed by helper: Buttocks, Right lower leg     Bathing assist Assist Level: Minimal Assistance - Patient > 75%     Upper Body Dressing/Undressing Upper body dressing   What is the patient wearing?: Pull over shirt    Upper body assist Assist Level: Set up assist    Lower Body Dressing/Undressing Lower body dressing      What is the patient wearing?: Incontinence brief, Pants     Lower body assist Assist for lower body dressing: Minimal Assistance - Patient > 75%     Toileting Toileting    Toileting assist Assist for toileting: Moderate Assistance - Patient 50 - 74%     Transfers Chair/bed transfer  Transfers assist  Chair/bed transfer activity did not occur: N/A  Chair/bed transfer assist level: Contact Guard/Touching assist (squat pivot)     Locomotion Ambulation   Ambulation assist      Assist level: Moderate Assistance - Patient 50 - 74% Assistive device: Walker-rolling Max distance: 163ft   Walk 10 feet activity   Assist  Walk 10 feet activity  did not occur: Safety/medical concerns  Assist level: Moderate Assistance - Patient - 50 - 74% Assistive device: Walker-rolling, Orthosis   Walk 50 feet activity   Assist Walk 50 feet with 2 turns activity did not occur: Safety/medical concerns  Assist level: Moderate Assistance - Patient - 50 - 74% Assistive device: Walker-rolling, Orthosis    Walk 150 feet activity   Assist Walk 150 feet activity did not occur: Safety/medical concerns         Walk 10 feet on uneven surface  activity   Assist Walk 10 feet on uneven surfaces activity did not occur: Safety/medical concerns         Wheelchair     Assist Is the patient using a  wheelchair?: Yes Type of Wheelchair: Manual    Wheelchair assist level: Supervision/Verbal cueing Max wheelchair distance: 127ft    Wheelchair 50 feet with 2 turns activity    Assist        Assist Level: Supervision/Verbal cueing   Wheelchair 150 feet activity     Assist      Assist Level: Supervision/Verbal cueing   Blood pressure 110/75, pulse 67, temperature 98.1 F (36.7 C), resp. rate 16, height 5\' 9"  (1.753 m), weight 103.3 kg, SpO2 100 %.  Medical Problem List and Plan: 1. 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  Antithrombotics: -DVT/anticoagulation:  amb >100' daily off heparin              -antiplatelet therapy: Aspirin 81 mg daily and Brilinta 90 mg twice daily 3. Pain Management: Tylenol as needed 4. Mood/Behavior/Sleep: Provide emotional support             -antipsychotic agents: N/A 5. Neuropsych/cognition: This patient is not capable of making decisions on his own behalf. 6. Skin/Wound Care: Routine skin checks 7. Fluids/Electrolytes/Nutrition: Routine in and outs with follow-up chemistries D/c CBG  CBG (last 3)  No results for input(s): "GLUCAP" in the last 72 hours.   8.  Seizure prophylaxis.  EEG negative.  Continue Keppra 500 mg daily 9.  AKI on CKD stage IV.  Follow-up chemistries.  Follow-up outpatient nephrology services             -pt given lokelma prior to rehab admit             Nephro signed off 5/24  -on Renvela and sodium bicarb  Filed Weights   08/22/22 0543 08/23/22 0538 08/24/22 0644  Weight: 107.5 kg 105.6 kg 103.3 kg        Latest Ref Rng & Units 08/21/2022    5:51 AM 08/18/2022    5:28  AM 08/17/2022    9:45 AM  BMP  Glucose 70 - 99 mg/dL 161  096  75   BUN 6 - 20 mg/dL 045  409  811   Creatinine 0.61 - 1.24 mg/dL 9.14  7.82  9.56   Sodium 135 - 145 mmol/L 139  137  138   Potassium 3.5 - 5.1 mmol/L 4.7  4.8  5.0   Chloride 98 - 111 mmol/L 106  102  101   CO2 22 - 32 mmol/L 20  22  23    Calcium 8.9 - 10.3 mg/dL 9.2  9.6  16.1      10.  Hyperlipidemia.  Crestor 20mg  QD 11.  Permissive hypertension.  Patient on amlodipine 10 mg daily, hydralazine 50 mg twice daily, labetalol 200 mg twice daily prior to admission.  Resume as needed  -5/31 BP well controlled Vitals:   08/21/22 1305 08/21/22 2034 08/22/22 0417 08/22/22 1338  BP: (!) 128/95 126/86 119/84 137/86   08/22/22 1930 08/23/22 0536 08/23/22 1418 08/23/22 2003  BP: 121/89 (!) 117/92 (!) 126/95 122/81   08/24/22 0500 08/24/22 1434 08/24/22 2005 08/25/22 0521  BP: 111/86 (!) 128/93 124/84 110/75    12.  Medical noncompliance.  Provide counseling 13. Constipation/incontinence of bowel: -5/27 increase miralax to BID, 5/29 small BM yesterday but none x several days   LOS: 24 days A FACE TO FACE EVALUATION WAS PERFORMED  Erick Colace 08/25/2022, 7:25 AM

## 2022-08-25 NOTE — Progress Notes (Signed)
Occupational Therapy Session Note  Patient Details  Name: Joseph Bright MRN: [id number] Date of Birth: Nov 02, 1975  Today's Date: 08/25/2022 OT Individual Time: 4098-1191 OT Individual Time Calculation (min): 70 min    Short Term Goals: Week 4:  OT Short Term Goal 1 (Week 4): STG=LTG d/t ELOS  Skilled Therapeutic Interventions/Progress Updates:    Pt resting in w/c upon arrival. Pt requested to use urinal while standing. Sit>stand with min A. Support at Rt knee provided and CGA for balance. Pt able to manipulate urinal and maintain grasp while using. Assistance for clothing mgmt when finished. Sit>supine with min A. Pt propelled w/c to gym and tranfserred to EOM. RUE GMC and FMC tasks: beads in theraputty (remove and replace using RUE); clothes pins placing on dowel and removing; Squigz removing from table, placing on table, and removing from table. Rest breaks during tasks. Pt completed tasks. Difficulty with control in open chain tasks. RUE fatigues quickly but is able to use functionally when reminded. Pt propelled w/c back to room and remained in w/c. All needs within reach.  Therapy Documentation Precautions:  Precautions Precautions: Fall Precaution Comments: right sided hemiparesis, expressive deficits Restrictions Weight Bearing Restrictions: No    Pain: Pt denies pain this morning   Therapy/Group: Individual Therapy  Rich Brave 08/25/2022, 11:56 AM

## 2022-08-25 NOTE — Progress Notes (Signed)
Physical Therapy Session Note  Patient Details  Name: Joseph Bright MRN: [id number] Date of Birth: February 28, 1976  Today's Date: 08/25/2022 PT Individual Time: 1500-1540 PT Individual Time Calculation (min): 40 min   Short Term Goals: Week 4:  PT Short Term Goal 1 (Week 4): = to LTGs based on ELOS  Skilled Therapeutic Interventions/Progress Updates: Pt presented in w/c agreeable to therapy. Pt denies pain during session. Session focused on ambulation, balance, and forced use of RLE. Pt propelled to day room with supervision via hemi technique. Pt set up with RW and ambulated 17ft and 13ft x 2 with RW and modA. Pt was able to stand with CGA however required facilitation at hips for weight shifting to L to allow increased offloading of RLE to facilitate advancement. PTA assisting in advancing RLE ~50% of time with all bouts. At times pt would use compensatory method of pushing onto L toes to help clear RLE vs weight shifting to L. PTA also assisted at times due to RLE catching on RW due to it being heavily externally rotated. Pt did require rest breaks between bouts. After final bout pt worked on standing balance playing x 1 round of horseshoes with LLE on 2in block for increased weight bearing through RLE. Pt also performed toe taps with LLE to 2in block x 10. Pt transferred back to w/c via stand pivot with RW and minA. Pt propelled back to room in same manner as prior and remained in w/c at end of session with call bell within reach and needs met.      Therapy Documentation Precautions:  Precautions Precautions: Fall Precaution Comments: right sided hemiparesis, expressive deficits Restrictions Weight Bearing Restrictions: No General:   Vital Signs: Therapy Vitals Temp: 98 F (36.7 C) Temp Source: Oral Pulse Rate: 87 Resp: 16 BP: (!) 128/93 Patient Position (if appropriate): Sitting Oxygen Therapy SpO2: 97 % O2 Device: Room Air   Therapy/Group: Individual Therapy  Asser Lucena 08/25/2022, 4:48 PM

## 2022-08-25 NOTE — Progress Notes (Signed)
Occupational Therapy Session Note  Patient Details  Name: Joseph Bright MRN: [id number] Date of Birth: 03/24/1976  Today's Date: 08/25/2022 OT Individual Time: 0900-1013 OT Individual Time Calculation (min): 73 min     Skilled Therapeutic Interventions/Progress Updates: Patient received sitting up in bed. Agreeable to OT treatment including getting into the shower. Patient able to get to the EOB from supine with CGA. Min assist for squat pivot to w/c. Therapist assisted, pushing w/c into bathroom and locking beside shower chair where patient used grab bars for a stand pivot to the bench. Patient able to bathe with only CGA/Min assist with thoroughness. Transfer out of shower with Min assist and therapist assist to position the RLE. Patient able to dress from w/c at the bed side with set up for UB and Min assist for LB including donning socks.  Continued treatment in therapy gym working on neuro reeducation activities focused on fine motor manipulation, supination/pronation during reach and in hand storage activities. Patient reports no deficits in sensation. Utilized cones for active reach with pronation for grasp to rotate cone and supinate into neutral to stand on the opposite end. Required therapist to stabilize cone for initial reach and grasp at end range pronation. Followed with thumb over index finger and index finger over thumb movements working on flipping playing cards and stacking them. Good focus and attention throughout task. Cued to take rest breaks when movements appeared fatigued. Conclude with in hand storage task working to isolate finger flexion to gather sheet of paper into palm from table drawing it into a ball then using finger extension to flatten it out again. Great motivation throughout treatment to regain independence with self care as well as functional recovery of the RUE. Continue with skilled OT POC to improve independence with self care skills and functional transfers.       Therapy Documentation Precautions:  Precautions Precautions: Fall Precaution Comments: right sided hemiparesis, expressive deficits Restrictions Weight Bearing Restrictions: No    Pain:No c/o   ADL: ADL Equipment Provided: Long-handled sponge Eating: Set up Where Assessed-Eating: Wheelchair Grooming: Supervision/safety Where Assessed-Grooming: Sitting at sink Upper Body Bathing: Supervision/safety Where Assessed-Upper Body Bathing: Shower Lower Body Bathing: Minimal assistance Where Assessed-Lower Body Bathing: Shower Upper Body Dressing: Supervision/safety Where Assessed-Upper Body Dressing: Wheelchair Lower Body Dressing: Contact guard Where Assessed-Lower Body Dressing: Wheelchair Toileting: Minimal assistance, Moderate assistance Where Assessed-Toileting: Teacher, adult education: Curator Method: Stand pivot, Ambulance person: Engineer, technical sales: International aid/development worker Method: Squat pivot, Stand pivot Tub/Shower Equipment: Acupuncturist: Insurance underwriter Method: Squat pivot, Stand pivot [employer]: Transfer tub bench   Therapy/Group: Individual Therapy  Warnell Forester 08/25/2022, 10:33 AM

## 2022-08-26 NOTE — Progress Notes (Signed)
RT came to place patient on CPAP HS. Patient already on CPAP HS. Patient tolerating well at this time.

## 2022-08-26 NOTE — Progress Notes (Signed)
RT came to place patient on CPAP HS. Patient already on CPAP tolerating well.

## 2022-08-26 NOTE — Progress Notes (Signed)
Physical Therapy Session Note  Patient Details  Name: Joseph Bright MRN: [id number] Date of Birth: [birth year]  Today's Date: 08/26/2022 PT Individual Time: 0807-0902 PT Individual Time Calculation (min): 55 min   Short Term Goals: Week 4:  PT Short Term Goal 1 (Week 4): = to LTGs based on ELOS  Skilled Therapeutic Interventions/Progress Updates:    Pt received supine in bed awake and agreeable to therapy session. Supine>sitting R EOB, HOB slightly elevated and using bedrail, with supervision and increased time/effort (pt using bedrail on opposite side of bed with L UE support to bring trunk upright and using L LE to assist with R LE management off bed). Sitting EOB with supervision, threaded on shorts, donned B shoes and R LE [place] Walk-on PLS AFO total assist for time management. Sit>stand with L UE support on w/c armrest with CGA - pulled pants up over hips with CGA for steadying balance. L stand pivot to w/c with CGA using L UE support on w/c armrests and pt compensating with WBing through L LE.   L hemi-technique w/c propulsion ~151ft to main therapy gym with distant supervision - continues to have improved propulsion speed and decreased number of push strokes with new loaner wheelchair from [place].   Sit<>stands to/from RW with CGA and pt able to place R hand on/off orthosis without assist or cuing.  Gait training 169ft using RW with primarily min assist of 1 for R LE management and only CGA for steadying balance with pt having good balance awareness and compensating with sustaining L stance and using RW support when needed. Pt demonstrating the following gait deviations with therapist providing the described cuing and facilitation for improvement:  - donned 4lb ankle weight on R LE for error augmentation with pt advancing R LE >50% of the way for the initial ~72ft; doffed ankle weight for final ~43ft with pt continuing to be able to advance limb ~50%  - wore ankle weight entire 2nd gait  trial with pt improving in ability to advance during swing; although continues to lack foot clearance, so toes drag causing R LE to pull into excessive external rotation - continues to have good stance control, but keeping knee in slight flexed positioning  Stair navigation training ascending/descending 11 steps x2 in stairwell (6" height) using L UE support on each HR with mod assist of 1 for R LE management and +2 for safety (pt sitting in chair at top and bottom of steps) - cuing for reciprocal pattern on ascent for R LE NMR and step-to pattern on descent for safety and to avoid R knee pain  - requires total assist to advance R LE up onto next step during ascent and max/total assist to guard R knee while stepping up due to pt being unable to activate R hip/knee extensors to power up through that LE in such a flexed position (but doesn't fully buckle); compensates by using L UE forearm support on handrail  - pt able to initiate advancing R LE down onto next step during descent requiring min assist, good R knee control during descent (continues to keep it in partial flexed position as with gait despite cuing to improve)  L hemi propulsion back to room. Pt left seated in w/c with needs in reach.  Therapy Documentation Precautions:  Precautions Precautions: Fall Precaution Comments: right sided hemiparesis, expressive deficits Restrictions Weight Bearing Restrictions: No   Pain:  No reports of pain throughout session.    Therapy/Group: Individual Therapy  Dorthy Hustead M Mackenzee Becvar ,  PT, DPT, NCS, CSRS 08/26/2022, 7:42 AM

## 2022-08-26 NOTE — Progress Notes (Signed)
PROGRESS NOTE   Subjective/Complaints:  No issues overnight, slept well, denies pain, LBM 2 days ago which is normal for him, urinating well, denies any other complaints or concerns.   ROS: Patient denies CP, SOB, abdominal pain,  N/V/D.      Objective:   No results found. No results for input(s): "WBC", "HGB", "HCT", "PLT" in the last 72 hours.    No results for input(s): "NA", "K", "CL", "CO2", "GLUCOSE", "BUN", "CREATININE", "CALCIUM" in the last 72 hours.    Intake/Output Summary (Last 24 hours) at 08/26/2022 1237 Last data filed at 08/26/2022 0915 Gross per 24 hour  Intake 476 ml  Output 630 ml  Net -154 ml        Physical Exam: Vital Signs Blood pressure 123/85, pulse 72, temperature 97.8 F (36.6 C), resp. rate 18, height 5\' 9"  (1.753 m), weight 104.7 kg, SpO2 100 %.    General: No acute distress, sitting in WC, appears comfortable  Mood and affect are appropriate Heart: RRR no murmur  Lungs: Clear to auscultation,no increased WOB Abdomen: Positive bowel sounds, soft nontender to palpation, nondistended Extremities: No clubbing, cyanosis, or edema Skin: No evidence of breakdown, no evidence of rash, calluses on knees   PRIOR EXAMS: Neurologic:Alert and oriented x 3. Follows commands, Normal insight and awareness. Intact Memory. Normal language and speech. Cranial nerve exam unremarkable. MMT: Left-sided strength is normal RUE 4 prox to distal. RLE 0/5 HF, KE, KF and 0/5 distally. Sensory exam normal sensation to light touch and proprioception in bilateral upper and lower extremities  Musculoskeletal: Full range of motion in all 4 extremities. No joint swelling     Assessment/Plan: 1. Functional deficits which require 3+ hours per day of interdisciplinary therapy in a comprehensive inpatient rehab setting. Physiatrist is providing close team supervision and 24 hour management of active medical problems  listed below. Physiatrist and rehab team continue to assess barriers to discharge/monitor patient progress toward functional and medical goals  Care Tool:  Bathing    Body parts bathed by patient: Face, Right arm, Left arm, Chest, Abdomen, Front perineal area, Buttocks, Right upper leg, Left upper leg, Left lower leg   Body parts bathed by helper: Right lower leg     Bathing assist Assist Level: Minimal Assistance - Patient > 75%     Upper Body Dressing/Undressing Upper body dressing   What is the patient wearing?: Pull over shirt    Upper body assist Assist Level: Set up assist    Lower Body Dressing/Undressing Lower body dressing      What is the patient wearing?: Pants, Incontinence brief     Lower body assist Assist for lower body dressing: Minimal Assistance - Patient > 75%     Toileting Toileting    Toileting assist Assist for toileting: Moderate Assistance - Patient 50 - 74%     Transfers Chair/bed transfer  Transfers assist  Chair/bed transfer activity did not occur: N/A  Chair/bed transfer assist level: Contact Guard/Touching assist (squat pivot)     Locomotion Ambulation   Ambulation assist      Assist level: Minimal Assistance - Patient > 75% Assistive device: Walker-rolling Max distance: 189ft  Walk 10 feet activity   Assist  Walk 10 feet activity did not occur: Safety/medical concerns  Assist level: Minimal Assistance - Patient > 75% Assistive device: Walker-rolling   Walk 50 feet activity   Assist Walk 50 feet with 2 turns activity did not occur: Safety/medical concerns  Assist level: Minimal Assistance - Patient > 75% Assistive device: Walker-rolling    Walk 150 feet activity   Assist Walk 150 feet activity did not occur: Safety/medical concerns         Walk 10 feet on uneven surface  activity   Assist Walk 10 feet on uneven surfaces activity did not occur: Safety/medical concerns          Wheelchair     Assist Is the patient using a wheelchair?: Yes Type of Wheelchair: Manual    Wheelchair assist level: Supervision/Verbal cueing Max wheelchair distance: 165ft    Wheelchair 50 feet with 2 turns activity    Assist        Assist Level: Supervision/Verbal cueing   Wheelchair 150 feet activity     Assist      Assist Level: Supervision/Verbal cueing   Blood pressure 123/85, pulse 72, temperature 97.8 F (36.6 C), resp. rate 18, height 5\' 9"  (1.753 m), weight 104.7 kg, SpO2 100 %.  Medical Problem List and Plan: 1. 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  Antithrombotics: -DVT/anticoagulation:  amb >100' daily off heparin              -antiplatelet therapy: Aspirin 81 mg daily and Brilinta 90 mg twice daily 3. Pain Management: Tylenol as needed 4. Mood/Behavior/Sleep: Provide emotional support             -antipsychotic agents: N/A 5. Neuropsych/cognition: This patient is not capable of making decisions on his own behalf. 6. Skin/Wound Care: Routine skin checks 7. Fluids/Electrolytes/Nutrition: Routine in and outs with follow-up chemistries D/c CBG   8.  Seizure prophylaxis.  EEG negative.  Continue Keppra 500 mg daily 9.  AKI on CKD stage IV.  Follow-up chemistries.  Follow-up outpatient nephrology services             -pt given lokelma prior to rehab admit             Nephro signed off 5/24  -on Renvela and sodium bicarb -08/26/22 wt stable, monitor routine labs  Filed Weights   08/23/22 0538 08/24/22 0644 08/26/22 0558  Weight: 105.6 kg 103.3 kg 104.7 kg        Latest Ref Rng & Units 08/21/2022    5:51  AM 08/18/2022    5:28 AM 08/17/2022    9:45 AM  BMP  Glucose 70 - 99 mg/dL 161  096  75   BUN 6 - 20 mg/dL 045  409  811   Creatinine 0.61 - 1.24 mg/dL 9.14  7.82  9.56   Sodium 135 - 145 mmol/L 139  137  138   Potassium 3.5 - 5.1 mmol/L 4.7  4.8  5.0   Chloride  98 - 111 mmol/L 106  102  101   CO2 22 - 32 mmol/L 20  22  23    Calcium 8.9 - 10.3 mg/dL 9.2  9.6  78.2      10.  Hyperlipidemia.  Crestor 20mg  QD 11.  Permissive hypertension.  Patient on amlodipine 10 mg daily, hydralazine 50 mg twice daily, labetalol 200 mg twice daily prior to admission.  Resume as needed  -08/26/22 BP well controlled off meds Vitals:   08/22/22 1338 08/22/22 1930 08/23/22 0536 08/23/22 1418  BP: 137/86 121/89 (!) 117/92 (!) 126/95   08/23/22 2003 08/24/22 0500 08/24/22 1434 08/24/22 2005  BP: 122/81 111/86 (!) 128/93 124/84   08/25/22 0521 08/25/22 1424 08/25/22 2021 08/26/22 0558  BP: 110/75 (!) 128/93 116/77 123/85    12.  Medical noncompliance.  Provide counseling 13. Constipation/incontinence of bowel: -5/27 increase miralax to BID, 5/29 small BM yesterday but none x several days  -08/26/22 no BM in 2 days but this is typical for him; continue Miralax BID, SenokotS 2 tabs BID, Sorbitol PRN for now, if no BM tomorrow may consider further increase in regimen.   LOS: 25 days A FACE TO FACE EVALUATION WAS PERFORMED  [address] 08/26/2022, 12:37 PM

## 2022-08-27 LAB — GLUCOSE, CAPILLARY
Glucose-Capillary: 89 mg/dL (ref 70–99)
Glucose-Capillary: 82 mg/dL (ref 70–99)
Glucose-Capillary: 68 mg/dL — ABNORMAL LOW (ref 70–99)

## 2022-08-27 MED ORDER — GLUCOSE 40 % PO GEL
ORAL | Status: AC
  Filled 2022-08-27: qty 1.21

## 2022-08-27 NOTE — Progress Notes (Signed)
RT came to place CPAP on pt but pt had already placed it on himself. Pt is resting comfortably

## 2022-08-27 NOTE — Discharge Summary (Signed)
Physician Discharge Summary  Patient ID: Joseph Bright MRN: [id number] DOB/AGE: Nov 07, 1975 47 y.o.  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  Labs:  Basic Metabolic Panel: Recent Labs  Lab 08/28/22 0728  NA 140  K 4.3  CL 110  CO2 22  GLUCOSE 108*  BUN 78*  CREATININE 3.66*  CALCIUM 9.3    CBC: Recent Labs  Lab 08/28/22 0728  WBC 5.4  HGB 12.7*  HCT 38.6*  MCV 93.2  PLT 182    CBG: Recent Labs  Lab 08/27/22 0620 08/27/22 1118 08/27/22 1658  GLUCAP 89 82 68*   Family history.  Positive for hypertension.  Denies any colon cancer esophageal cancer or rectal cancer  Brief HPI:   Joseph Bright is a 47 y.o. right-handed male with history of hypertension, CKD stage IV followed outpatient nephrology as well as medical noncompliance, quit smoking 22 years ago.  Per chart review lives with spouse and young daughter.  1 level home multiple steps to entry independent prior to admission.  Presented 07/25/2022 with acute onset of right-sided weakness as well as speech difficulty.  Cranial CT scan negative for acute changes.  There was chronic appearing right thalamic lacunar infarct.  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  Status post TNK with interventional radiology for stenting.  MRI follow-up showed confluent left ACA territory infarction.  Cytotoxic edema with no hemorrhagic transformation.  Multiple additional scattered mostly punctate ischemic foci in the left greater than right MCA,  PCA and occasionally cerebellar artery territories.  No significant mass effect.  Echocardiogram with ejection fraction of 70 to 75% grade 1 diastolic dysfunction.  Patient with reported seizure-like activity.  EEG negative for seizure.  Maintained on Keppra for seizure prophylaxis.  TEE unremarkable no PFO no cardioembolic source.  Neurology follow-up maintained on aspirin as well as Brilinta 90 mg twice daily for CVA prophylaxis.  Underwent loop recorder placement 07/28/2022.  Subcutaneous heparin added for DVT prophylaxis.  Initially on tube feeds for nutritional support diet advanced to regular.  Bouts of hyperkalemia patient did require Lokelma with latest potassium 5.8.  Therapy evaluations completed due to patient decreased functional mobility right side weakness was admitted for a comprehensive rehab program.   Hospital Course: Joseph Bright was admitted to rehab 08/01/2022 for inpatient therapies to consist of PT, ST and OT at least three hours five days a week. Past admission physiatrist, therapy team and rehab RN have worked together to provide customized collaborative inpatient rehab.  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  Patient remained on aspirin and Brilinta per neurology services.  Initially with subcutaneous heparin for DVT prophylaxis discontinued ambulating greater than 100 feet.  Seizure prophylaxis EEG negative Keppra as indicated no seizure activity.  AKI on CKD stage IV follow-up chemistries followed by nephrology services on Renvela as well as sodium bicarb and latest creatinine 4.23.  Permissive hypertension remained controlled patient had been on amlodipine hydralazine labetalol prior to admission resume as needed follow-up outpatient.  Patient had been receiving CPAP while in the hospital and would need outpatient  polysomnogram versus sleep study to qualify and plan ambulatory referral to pulmonary services.  Patient with noted  medical noncompliance receiving counts regards to maintaining medical regimen.   Blood pressures were monitored on TID basis and controlled     Rehab course: During patient's stay in rehab weekly team conferences were held to monitor patient's progress, set goals and discuss barriers to discharge. At admission, patient required total assist stand pivot transfers max assist squat pivot transfers  Physical exam.  Blood pressure 145/90 pulse 94 temperature 98.4 respirations 20 oxygen saturations 98% room air Constitutional.  No acute distress HEENT Head.  Normocephalic and atraumatic Eyes.  Pupils round and reactive to light no discharge without nystagmus Neck.  Supple nontender no JVD without thyromegaly Cardiac regular rate and rhythm without any extra sounds or murmur heard Abdomen.  Soft nontender positive bowel sounds without rebound Respiratory effort normal no respiratory distress without wheeze Skin.  Warm and dry Neurologic.  Patient alert.  Makes eye contact with examiner.  Speaks in single words with some word finding difficulties.  Right central 7 tongue deviation.  Right upper and right lower extremity 0/5.  Left upper left lower extremity 5/5.  Senses light touch in all 4 limbs.  He/She  has had improvement in activity tolerance, balance, postural control as well as ability to compensate for deficits. He/She has had improvement in functional use RUE/LUE  and RLE/LLE as well as improvement in awareness.  Supine to sit edge of bed with supervision.  Threaded on shorts donned bilateral shoes with supervision.  Wheelchair propulsion with distant supervision.  Sit to stand rolling walker contact-guard.  Ambulates 140 feet rolling walker.  Sit to stand with minimal assist.  Patient able to manipulate urinal and maintain grasp while using.  Assistance for clothing management when finished.  Sit to supine with minimal assist.  Patient with steady gains met 4 out of 4 short-term goals and  ability to answer questions expand length of utterances and use written communication.  Full family teaching completed plan discharge to home       Disposition: Discharge disposition: 01-Home or Self Care     Discharged to home   Diet: Regular  Special Instructions: No driving smoking or alcohol  Ambulatory referral obtained with pulmonary services to evaluate for CPAP  Medications at discharge. 1.  Tylenol as needed 2.  Aspirin 81 mg p.o. daily 3.  Keppra 500 mg p.o. daily 4.  Crestor 20 mg p.o. daily 5.  Renvela 800 mg p.o. 3 times daily with meals 6.  Sodium bicarbonate 1300 mg p.o. twice daily 7.  Brilinta 90 mg p.o. 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   Specialty: Physical Medicine and Rehabilitation Why: Office to call for appointment Contact information: [id number] [place]  [address] [phone]         Regan Lemming, MD Follow up.   Specialty: Cardiology Why: Call for appointment Contact information: [address] [place] [address] [place] [address] [phone]         Ethelene Hal, MD Follow up.   Specialty: Nephrology Why: Call for appointment Contact information: [address] [place] [address] [phone]                 Signed: Mcarthur Rossetti Chace Klippel 08/31/2022, 5:42 AM

## 2022-08-27 NOTE — Progress Notes (Signed)
PROGRESS NOTE   Subjective/Complaints:  No issues overnight, slept alright, denies pain, LBM yesterday, urinating well, denies any other complaints or concerns.   ROS: Patient denies CP, SOB, abdominal pain,  N/V/D/C.      Objective:   No results found. No results for input(s): "WBC", "HGB", "HCT", "PLT" in the last 72 hours.    No results for input(s): "NA", "K", "CL", "CO2", "GLUCOSE", "BUN", "CREATININE", "CALCIUM" in the last 72 hours.    Intake/Output Summary (Last 24 hours) at 08/27/2022 1106 Last data filed at 08/27/2022 1045 Gross per 24 hour  Intake 716 ml  Output 1000 ml  Net -284 ml        Physical Exam: Vital Signs Blood pressure 117/83, pulse 63, temperature 97.8 F (36.6 C), temperature source Oral, resp. rate 18, height 5\' 9"  (1.753 m), weight 105.3 kg, SpO2 100 %.    General: No acute distress, laying in bed, appears comfortable  Mood and affect are appropriate Heart: RRR no murmur  Lungs: Clear to auscultation,no increased WOB Abdomen: Positive bowel sounds, soft nontender to palpation, nondistended Extremities: No clubbing, cyanosis, or edema Skin: No evidence of breakdown, no evidence of rash, calluses on knees   PRIOR EXAMS: Neurologic:Alert and oriented x 3. Follows commands, Normal insight and awareness. Intact Memory. Normal language and speech. Cranial nerve exam unremarkable. MMT: Left-sided strength is normal RUE 4 prox to distal. RLE 0/5 HF, KE, KF and 0/5 distally. Sensory exam normal sensation to light touch and proprioception in bilateral upper and lower extremities  Musculoskeletal: Full range of motion in all 4 extremities. No joint swelling     Assessment/Plan: 1. Functional deficits which require 3+ hours per day of interdisciplinary therapy in a comprehensive inpatient rehab setting. Physiatrist is providing close team supervision and 24 hour management of active medical  problems listed below. Physiatrist and rehab team continue to assess barriers to discharge/monitor patient progress toward functional and medical goals  Care Tool:  Bathing    Body parts bathed by patient: Face, Right arm, Left arm, Chest, Abdomen, Front perineal area, Buttocks, Right upper leg, Left upper leg, Left lower leg   Body parts bathed by helper: Right lower leg     Bathing assist Assist Level: Minimal Assistance - Patient > 75%     Upper Body Dressing/Undressing Upper body dressing   What is the patient wearing?: Pull over shirt    Upper body assist Assist Level: Set up assist    Lower Body Dressing/Undressing Lower body dressing      What is the patient wearing?: Pants, Incontinence brief     Lower body assist Assist for lower body dressing: Minimal Assistance - Patient > 75%     Toileting Toileting    Toileting assist Assist for toileting: Moderate Assistance - Patient 50 - 74%     Transfers Chair/bed transfer  Transfers assist  Chair/bed transfer activity did not occur: N/A  Chair/bed transfer assist level: Contact Guard/Touching assist (squat pivot)     Locomotion Ambulation   Ambulation assist      Assist level: Minimal Assistance - Patient > 75% Assistive device: Walker-rolling Max distance: 171ft   Walk 10 feet activity  Assist  Walk 10 feet activity did not occur: Safety/medical concerns  Assist level: Minimal Assistance - Patient > 75% Assistive device: Walker-rolling   Walk 50 feet activity   Assist Walk 50 feet with 2 turns activity did not occur: Safety/medical concerns  Assist level: Minimal Assistance - Patient > 75% Assistive device: Walker-rolling    Walk 150 feet activity   Assist Walk 150 feet activity did not occur: Safety/medical concerns         Walk 10 feet on uneven surface  activity   Assist Walk 10 feet on uneven surfaces activity did not occur: Safety/medical concerns          Wheelchair     Assist Is the patient using a wheelchair?: Yes Type of Wheelchair: Manual    Wheelchair assist level: Supervision/Verbal cueing Max wheelchair distance: 150ft    Wheelchair 50 feet with 2 turns activity    Assist        Assist Level: Supervision/Verbal cueing   Wheelchair 150 feet activity     Assist      Assist Level: Supervision/Verbal cueing   Blood pressure 117/83, pulse 63, temperature 97.8 F (36.6 C), temperature source Oral, resp. rate 18, height 5\' 9"  (1.753 m), weight 105.3 kg, SpO2 100 %.  Medical Problem List and Plan: 1. 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  Antithrombotics: -DVT/anticoagulation:  amb >100' daily off heparin              -antiplatelet therapy: Aspirin 81 mg daily and Brilinta 90 mg twice daily 3. Pain Management: Tylenol as needed 4. Mood/Behavior/Sleep: Provide emotional support             -antipsychotic agents: N/A 5. Neuropsych/cognition: This patient is not capable of making decisions on his own behalf. 6. Skin/Wound Care: Routine skin checks 7. Fluids/Electrolytes/Nutrition: Routine in and outs with follow-up chemistries D/c CBG   8.  Seizure prophylaxis.  EEG negative.  Continue Keppra 500 mg daily 9.  AKI on CKD stage IV.  Follow-up chemistries.  Follow-up outpatient nephrology services             -pt given lokelma prior to rehab admit             Nephro signed off 5/24  -on Renvela and sodium bicarb -6/1-2/24 wt slightly uptrending but mostly stable, monitor routine labs  [facility] Weights   08/24/22 0644 08/26/22 0558 08/27/22 0414  Weight: 103.3 kg 104.7 kg 105.3 kg         Latest Ref Rng & Units 08/21/2022    5:51 AM 08/18/2022    5:28 AM 08/17/2022    9:45 AM  BMP  Glucose 70 - 99 mg/dL 578  469  75   BUN 6 - 20 mg/dL 629  528  413   Creatinine 0.61 - 1.24 mg/dL 2.44  0.10  2.72   Sodium 135 - 145 mmol/L 139  137  138   Potassium 3.5 - 5.1 mmol/L 4.7  4.8  5.0   Chloride 98 -  111 mmol/L 106  102  101   CO2 22 - 32 mmol/L 20  22  23    Calcium 8.9 - 10.3 mg/dL 9.2  9.6  16.1      10.  Hyperlipidemia.  Crestor 20mg  QD 11.  Permissive hypertension.  Patient on amlodipine 10 mg daily, hydralazine 50 mg twice daily, labetalol 200 mg twice daily prior to admission.  Resume as needed  -6/1-2/24 BP well controlled off meds Vitals:   08/23/22 1418 08/23/22 2003 08/24/22 0500 08/24/22 1434  BP: (!) 126/95 122/81 111/86 (!) 128/93   08/24/22 2005 08/25/22 0521 08/25/22 1424 08/25/22 2021  BP: 124/84 110/75 (!) 128/93 116/77   08/26/22 0558 08/26/22 1334 08/26/22 2006 08/27/22 0414  BP: 123/85 133/87 (!) 148/92 117/83    12.  Medical noncompliance.  Provide counseling 13. Constipation/incontinence of bowel: -5/27 increase miralax to BID, 5/29 small BM yesterday but none x several days  -08/26/22 no BM in 2 days but this is typical for him; continue Miralax BID, SenokotS 2 tabs BID, Sorbitol PRN for now, if no BM tomorrow may consider further increase in regimen.  -08/27/22 LBM yesterday, cont regimen as is  LOS: 26 days A FACE TO FACE EVALUATION WAS PERFORMED  [address] 08/27/2022, 11:06 AM

## 2022-08-27 NOTE — Progress Notes (Signed)
Occupational Therapy Session Note  Patient Details  Name: Joseph Bright MRN: [id number] Date of Birth: 1975-11-14  Today's Date: 08/27/2022 OT Individual Time: 1315-1415 OT Individual Time Calculation (min): 60 min    Short Term Goals: Week 4:  OT Short Term Goal 1 (Week 4): STG=LTG d/t ELOS  Skilled Therapeutic Interventions/Progress Updates:    Pt semi reclined in bed, requesting to shower and have BM.  Pt completed supine to sit with supervision.  Sit to stand at [facility] including self initiating use of walker splint right hand with close supervision.  Pt ambulated to toilet with mod assist for RLE positioning and support.  Toilet transfer completed with CGA.  Pt was continent of bowel and bladder (see flowsheet).  Clothing management with min assist to thread over RLE.  Pt ambulated a few steps and pivot sit using RW with mod assist for RLE positioning.  Pt bathed UB/LB seated with supervision using LHS.  Stand pivoted to w/c with min assist. Pt donned shirt with setup.  Pt donned boxers and shorts with mod assist at sit<>stand level.  Educated pt on hemi technique to donn socks however pt unable to achieve figure 4 position therefore required min assist to donn left and total assist to donn right as well as ankle brace and shoe.  Transported to dayroom at tabletop and facilitated Right hand fine motor coordination using small beads to complete in hand translation finger<>palm with min to mod cues for neuro re-ed of normal movement patterns.  Returned to room, call bell and needs in reach.   Therapy Documentation Precautions:  Precautions Precautions: Fall Precaution Comments: right sided hemiparesis, expressive deficits Restrictions Weight Bearing Restrictions: No   Therapy/Group: Individual Therapy  Amie Critchley 08/27/2022, 8:18 AM

## 2022-08-28 LAB — CBC
HCT: 38.6 % — ABNORMAL LOW (ref 39.0–52.0)
Hemoglobin: 12.7 g/dL — ABNORMAL LOW (ref 13.0–17.0)
MCH: 30.7 pg (ref 26.0–34.0)
MCHC: 32.9 g/dL (ref 30.0–36.0)
MCV: 93.2 fL (ref 80.0–100.0)
Platelets: 182 K/uL (ref 150–400)
RBC: 4.14 MIL/uL — ABNORMAL LOW (ref 4.22–5.81)
RDW: 11.9 % (ref 11.5–15.5)
WBC: 5.4 K/uL (ref 4.0–10.5)
nRBC: 0 % (ref 0.0–0.2)

## 2022-08-28 LAB — BASIC METABOLIC PANEL WITH GFR
Anion gap: 8 (ref 5–15)
BUN: 78 mg/dL — ABNORMAL HIGH (ref 6–20)
CO2: 22 mmol/L (ref 22–32)
Calcium: 9.3 mg/dL (ref 8.9–10.3)
Chloride: 110 mmol/L (ref 98–111)
Creatinine, Ser: 3.66 mg/dL — ABNORMAL HIGH (ref 0.61–1.24)
GFR, Estimated: 20 mL/min — ABNORMAL LOW
Glucose, Bld: 108 mg/dL — ABNORMAL HIGH (ref 70–99)
Potassium: 4.3 mmol/L (ref 3.5–5.1)
Sodium: 140 mmol/L (ref 135–145)

## 2022-08-28 NOTE — Progress Notes (Addendum)
Patient ID: Joseph Bright, male   DOB: Aug 24, 1975, 47 y.o.   MRN: [id number]  Patient requesting CPAP for discharge.   1:49 PM: Sw informed by Dr. Roda Shutters CPAP is not required for CVA discharge. Patient will require OP follow up. Dr Pearlean Brownie being consulted to see if pt will qualify for [facility] clinical trial. No additional questions or concerns.   2:02 PM: SW updated from Dr. Pearlean Brownie that patient will need either an outpatient polysomnogram or portable home sleep study to first confirm the diagnosis before CPAP can be prescribed.

## 2022-08-28 NOTE — Progress Notes (Signed)
Occupational Therapy Session Note  Patient Details  Name: Joseph Bright MRN: [id number] Date of Birth: 27-Mar-1976  Today's Date: 08/28/2022 OT Individual Time: 4098-1191 OT Individual Time Calculation (min): 30 min  OT Individual Time: 1420-1530 OT Individual Time Calculation (min): 70 min   Short Term Goals: Week 4:  OT Short Term Goal 1 (Week 4): STG=LTG d/t ELOS  Skilled Therapeutic Interventions/Progress Updates:     AM Session:  Pt received semi-reclined in bed presenting to be in good spirits receptive to skilled OT session reporting 0/10 pain- OT offering intermittent rest breaks, repositioning, and therapeutic support to optimize participation in therapy session. Focus this session BADL retraining and RUE NMR to increase functional use of RUE.   Pt transitioned to EOB using modified technique with supervision using bed railed with HOB slightly elevated. Pt utilized hemi-dressing technique to weave feet into pants with increased time provided. Completed sit>stand CGA with OT stabilizing RLE to bring pants to waist. Partial stand pivot EOB>wc CGA.   Pt propelled wc to therapy gym using hemi-technique with supervision. Engaged Pt in FM tabletop activities to increase coordination, strength, and in-hand manipulation for BADLs. Pt instructed to utilize RUE to sort and flip over playing cards with Pt able to complete with min verbal cueing to avoid using compensatory technique of bringing cards to edge of table vs applying adequate pressure to remove card from table. Pt then utilize RUE to recreate picture pattern using small sized pegs. Graded task by initially presenting pegs to Pt in correct position to place in board and as Pt mastered basic skill, graded up task to facilitate palm to finger tip translation and in-hand manipulation to position pegs. Pt then utilized tweezers to remove pegs with min dropping noted.   Pt propelled his wc back to his room using hemi-technique and OT  providing mod verbal cues to utilize RUE during task. Placed socks over Pt's LUE to facilitated forced use of RUE during functional tasks between therapy sessions with Pt receptive. Pt was handed off to SLP, Denyse Amass, at end of session with all needs met.    PM Session:  Pt received sitting up in wc with all BADL need met upon OT arrival presenting to be in good spirits receptive to skilled OT session reporting 0/10 pain- OT offering intermittent rest breaks, repositioning, and therapeutic support to optimize participation in therapy session.   Pt propelled his WC to therapy gym using hemi-technique for endurance and functional mobility training with Pt able to navigate around obstacles with min verbal cues provided for awareness to right side.   Engaged Pt in stable top functional reaching and VM coordination activity to support increase control motor movements and improved functional use of RUE. Pt instructed to place magnetic checkers on vertically slanted checkered board placing opposite colored checker on checker game tile to facilitate targeted reach. Pt able to complete with min dropping with decreased dysmetria noted compared to previous sessions.   Pt completed partial stand pivot WC>EOM with CGA OT stabilizing RLE. Pt completed BUE FM activity to simulate work related tasks using large bolts and screws. Pt instructed to stabilize bolt with LUE and complete twisting movements with RUE to tighten large bolts. Pt requiring significantly increased amount of time d/t motor planning challenged, however Pt able to complete task with supervision min cueing provided for technique and mod cueing required for motivation.   Engaged Pt in dynamic standing balance bean bag toss activity with posterior reaching, partial sit<>stands, and targeted throwing with  grasp and release facilitated into task to work on grading force for RUE movements and skills required for BADLs. OT facilitated RLE quad engagement through  tacite cueing facilitating increased weight shifting to R side. Pt able to actively engaged RLE with mod cueing. Completed sit<>stands throughout task with CGA OT stabilizing RLE. When reaching posteriorly to retrieve bean bags, Pt initially able to extend through hips and knees to rise towards standing, however with fatigue Pt occasionally unable to come full standing position and lowering to mat. Worked on engaging quads and glutes during sit<>stands through tactile cueing from OT and verbal cues for mind/body connection with noted improvement.  Pt reporting fatigue following. Squat pivot EOM>wc CGA. Pt propelled wc back to room using hemi-technique with RUE incorporated into task ~25% of the time when provided mod verbal cues.  Placed socks over Pt's LUE to facilitated forced use of RUE during functional tasks between therapy sessions with Pt receptive. Pt was left resting in wc with call bell in reach, seat belt alarm on, and all needs met.    Therapy Documentation Precautions:  Precautions Precautions: Fall Precaution Comments: right sided hemiparesis, expressive deficits Restrictions Weight Bearing Restrictions: No   Therapy/Group: Individual Therapy  Army Fossa 08/28/2022, 8:02 AM

## 2022-08-28 NOTE — Progress Notes (Signed)
Speech Language Pathology Daily Session Note  Patient Details  Name: Joseph Bright MRN: [id number] Date of Birth: [birth year]  Today's Date: 08/28/2022 SLP Individual Time: 3086-5784 SLP Individual Time Calculation (min): 42 min  Short Term Goals: Week 4: SLP Short Term Goal 1 (Week 4): STGs= LTGs due to ELOS  Skilled Therapeutic Interventions:  Pt was seen in am to address expressive language. Pt was alert and seated upright in WC upon SLP arrival. SLP challenging pt in expressive language this date. SLP challenged pt's language in mild to moderately complex language scheduling task with encouraged use of transition words. Pt completed task with sup A and ability to narrate scheduling of 5 functional task. In additional minutes of session, SLP challenged pt in functional communication. He answered questions about family, plan for discharge, and new living environment with sup to min A with few instances of communication breakdown and hesitation where he warranted min A. SLP finally challenged pt in role play of communication in a restaurant setting with pt placing an order and answering questions with mod I. Pt was left seated upright in WC with call button within reach and chair alarm active. SLP to continue POC.   Pain Pain Assessment Pain Scale: 0-10 Pain Score: 0-No pain  Therapy/Group: Individual Therapy  Renaee Munda 08/28/2022, 10:28 AM

## 2022-08-28 NOTE — Progress Notes (Signed)
   08/28/22 2303  BiPAP/CPAP/SIPAP  BiPAP/CPAP/SIPAP Pt Type Adult  BiPAP/CPAP/SIPAP Resmed  Mask Type Nasal mask  Mask Size Medium  Respiratory Rate 18 breaths/min  Pressure Support 0 cmH20  FiO2 (%) 21 %  Patient Home Equipment No  Auto Titrate Yes (min-5 max-16)

## 2022-08-28 NOTE — Progress Notes (Signed)
Physical Therapy Session Note  Patient Details  Name: Joseph Bright MRN: [id number] Date of Birth: Aug 08, 1975  Today's Date: 08/28/2022 PT Individual Time: 1250-1335 PT Individual Time Calculation (min): 45 min   Short Term Goals: Week 4:  PT Short Term Goal 1 (Week 4): = to LTGs based on ELOS  Skilled Therapeutic Interventions/Progress Updates:    Chart reviewed and pt agreeable to therapy. Pt received seated in WC with no c/o pain. Session focused on amb quality and NMR to promote safe home access. Pt initiated session with ModI WC propulsion to therapy gym. Pt then completed series of amb practices of 39ft using CGA + PFRW for balance and MaxA for RLE progression. Pt then completed +5 mins at NuStep with emphasis on RLE knee control for first round and RUE grip control for second round. Pt noted to have increased RUE grip compared to prior session. Pt transferred to[facility]from [facility] with CGA t/o session. At end of session, pt was left seated in [facility] with nurse call bell and all needs in reach, and verbal confirmation from RN that pt does not need alarm.     Therapy Documentation Precautions:  Precautions Precautions: Fall Precaution Comments: right sided hemiparesis, expressive deficits Restrictions Weight Bearing Restrictions: No General:       Therapy/Group: Individual Therapy  Dionne Milo, PT, DPT 08/28/2022, 1:44 PM

## 2022-08-28 NOTE — Progress Notes (Signed)
PROGRESS NOTE   Subjective/Complaints:  Pt feels ok, using a sock on left hand to enc RUE use  Discussed Right ankle brace  Reviewed labs  ROS: Patient denies CP, SOB, abdominal pain,  N/V/D/C.      Objective:   No results found. Recent Labs    08/28/22 0728  WBC 5.4  HGB 12.7*  HCT 38.6*  PLT 182      Recent Labs    08/28/22 0728  NA 140  K 4.3  CL 110  CO2 22  GLUCOSE 108*  BUN 78*  CREATININE 3.66*  CALCIUM 9.3      Intake/Output Summary (Last 24 hours) at 08/28/2022 1042 Last data filed at 08/28/2022 0700 Gross per 24 hour  Intake 480 ml  Output 650 ml  Net -170 ml         Physical Exam: Vital Signs Blood pressure (!) 130/91, pulse 60, temperature 97.8 F (36.6 C), temperature source Oral, resp. rate 18, height 5\' 9"  (1.753 m), weight 105.3 kg, SpO2 100 %.    General: No acute distress, laying in bed, appears comfortable  Mood and affect are appropriate Heart: RRR no murmur  Lungs: Clear to auscultation,no increased WOB Abdomen: Positive bowel sounds, soft nontender to palpation, nondistended Extremities: No clubbing, cyanosis, or edema Skin: No evidence of breakdown, no evidence of rash, calluses on knees   Neurologic:Alert and oriented x 3. Follows commands, Normal insight and awareness. Intact Memory. Normal language and speech. Cranial nerve exam unremarkable. MMT: Left-sided strength is normal RUE 4 prox to distal. RLE 0/5 HF, KE, KF and 0/5 distally. Sensory exam normal sensation to light touch and proprioception in bilateral upper and lower extremities  Musculoskeletal: Full range of motion in all 4 extremities. No joint swelling     Assessment/Plan: 1. Functional deficits which require 3+ hours per day of interdisciplinary therapy in a comprehensive inpatient rehab setting. Physiatrist is providing close team supervision and 24 hour management of active medical problems listed  below. Physiatrist and rehab team continue to assess barriers to discharge/monitor patient progress toward functional and medical goals  Care Tool:  Bathing    Body parts bathed by patient: Face, Right arm, Left arm, Chest, Abdomen, Front perineal area, Buttocks, Right upper leg, Left upper leg, Left lower leg   Body parts bathed by helper: Right lower leg     Bathing assist Assist Level: Minimal Assistance - Patient > 75%     Upper Body Dressing/Undressing Upper body dressing   What is the patient wearing?: Pull over shirt    Upper body assist Assist Level: Set up assist    Lower Body Dressing/Undressing Lower body dressing      What is the patient wearing?: Pants, Incontinence brief     Lower body assist Assist for lower body dressing: Minimal Assistance - Patient > 75%     Toileting Toileting    Toileting assist Assist for toileting: Moderate Assistance - Patient 50 - 74%     Transfers Chair/bed transfer  Transfers assist  Chair/bed transfer activity did not occur: N/A  Chair/bed transfer assist level: Contact Guard/Touching assist (squat pivot)     Locomotion Ambulation  Ambulation assist      Assist level: Minimal Assistance - Patient > 75% Assistive device: Walker-rolling Max distance: 124ft   Walk 10 feet activity   Assist  Walk 10 feet activity did not occur: Safety/medical concerns  Assist level: Minimal Assistance - Patient > 75% Assistive device: Walker-rolling   Walk 50 feet activity   Assist Walk 50 feet with 2 turns activity did not occur: Safety/medical concerns  Assist level: Minimal Assistance - Patient > 75% Assistive device: Walker-rolling    Walk 150 feet activity   Assist Walk 150 feet activity did not occur: Safety/medical concerns         Walk 10 feet on uneven surface  activity   Assist Walk 10 feet on uneven surfaces activity did not occur: Safety/medical concerns          Wheelchair     Assist Is the patient using a wheelchair?: Yes Type of Wheelchair: Manual    Wheelchair assist level: Supervision/Verbal cueing Max wheelchair distance: 148ft    Wheelchair 50 feet with 2 turns activity    Assist        Assist Level: Supervision/Verbal cueing   Wheelchair 150 feet activity     Assist      Assist Level: Supervision/Verbal cueing   Blood pressure (!) 130/91, pulse 60, temperature 97.8 F (36.6 C), temperature source Oral, resp. rate 18, height 5\' 9"  (1.753 m), weight 105.3 kg, SpO2 100 %.  Medical Problem List and Plan: 1. 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  Antithrombotics: -DVT/anticoagulation:  amb >100' daily off heparin              -antiplatelet therapy: Aspirin 81 mg daily and Brilinta 90 mg twice daily 3. Pain Management: Tylenol as needed 4. Mood/Behavior/Sleep: Provide emotional support             -antipsychotic agents: N/A 5. Neuropsych/cognition: This patient is not capable of making decisions on his own behalf. 6. Skin/Wound Care: Routine skin checks 7. Fluids/Electrolytes/Nutrition: Routine in and outs with follow-up chemistries D/c CBG   8.  Seizure prophylaxis.  EEG negative.  Continue Keppra 500 mg daily 9.  AKI on CKD stage IV.  Follow-up chemistries.  Follow-up outpatient nephrology services             -pt given lokelma prior to rehab admit             Nephro signed off 5/24  -on Renvela and sodium bicarb -6/1-2/24 wt slightly uptrending but mostly stable, monitor routine labs  [facility] Weights   08/24/22 0644 08/26/22 0558 08/27/22 0414  Weight: 103.3 kg 104.7 kg 105.3  kg        Latest Ref Rng & Units 08/28/2022    7:28 AM 08/21/2022    5:51 AM 08/18/2022    5:28 AM  BMP  Glucose 70 - 99 mg/dL 562  130  865   BUN 6 - 20 mg/dL 78  784  696   Creatinine 0.61 - 1.24 mg/dL 2.95  2.84  4.32   Sodium 135 - 145 mmol/L 140  139  137   Potassium 3.5 - 5.1 mmol/L 4.3  4.7  4.8   Chloride 98 - 111 mmol/L 110  106  102   CO2 22 - 32 mmol/L 22  20  22    Calcium 8.9 - 10.3 mg/dL 9.3  9.2  9.6     Improving  10.  Hyperlipidemia.  Crestor 20mg  QD 11.  Permissive hypertension.  Patient on amlodipine 10 mg daily, hydralazine 50 mg twice daily, labetalol 200 mg twice daily prior to admission.  Resume as needed  -6/1-2/24 BP well controlled off meds Vitals:   08/24/22 1434 08/24/22 2005 08/25/22 0521 08/25/22 1424  BP: (!) 128/93 124/84 110/75 (!) 128/93   08/25/22 2021 08/26/22 0558 08/26/22 1334 08/26/22 2006  BP: 116/77 123/85 133/87 (!) 148/92   08/27/22 0414 08/27/22 1257 08/27/22 1926 08/28/22 0338  BP: 117/83 131/84 138/84 (!) 130/91    12.  Medical noncompliance.  Provide counseling 13. Constipation/incontinence of bowel: -5/27 increase miralax to BID, 5/29 small BM yesterday but none x several days  -08/26/22 no BM in 2 days but this is typical for him; continue Miralax BID, SenokotS 2 tabs BID, Sorbitol PRN for now, if no BM tomorrow may consider further increase in regimen.  -LBM 6/2 cont current tx  LOS: 27 days A FACE TO FACE EVALUATION WAS PERFORMED  Erick Colace 08/28/2022, 10:42 AM

## 2022-08-29 NOTE — Progress Notes (Signed)
Pt is self sufficient with CPAP and resting comfortably without help from RT

## 2022-08-29 NOTE — Progress Notes (Signed)
Speech Language Pathology Daily Session Note  Patient Details  Name: Joseph Bright MRN: [id number] Date of Birth: 05/22/1975  Today's Date: 08/29/2022 SLP Individual Time: 4098-1191 SLP Individual Time Calculation (min): 28 min  Short Term Goals: Week 4: SLP Short Term Goal 1 (Week 4): STGs= LTGs due to ELOS  Skilled Therapeutic Interventions:  Pt was seen in PM to address expressive language. Pt was alert and seated upright in WC upon SLP arrival. He was agreeable for session. SLP challenging pt in complex communication through structured task. Given scenarios presented verbally, pt challenged to verbalize a response. Pt verbalized a 4-6 word sentence with 90% acc with sup A. Pt also challenged in verbalizing synonyms and antonyms which he completed with 90% acc and 80% acc respectively with sup to min A. Pt was left seated upright in WC with call button within reach. SLP to continue POC.   Pain Pain Assessment Pain Scale: 0-10 Pain Score: 0-No pain  Therapy/Group: Individual Therapy  Renaee Munda 08/29/2022, 4:23 PM

## 2022-08-29 NOTE — Progress Notes (Signed)
Patient ID: Joseph Bright, male   DOB: May 27, 1975, 47 y.o.   MRN: [id number]  Adapt order canceled for a RW, BSC and TTB.

## 2022-08-29 NOTE — Progress Notes (Signed)
Occupational Therapy Session Note  Patient Details  Name: Joseph Bright MRN: [id number] Date of Birth: 13-Apr-1975  Today's Date: 08/29/2022 OT Individual Time: 1300-1400 OT Individual Time Calculation (min): 60 min    Short Term Goals: Week 4:  OT Short Term Goal 1 (Week 4): STG=LTG d/t ELOS  Skilled Therapeutic Interventions/Progress Updates:     Pt received sitting up in wc presenting to be in good spirits receptive to skilled OT session reporting 0/10 pain- OT offering intermittent rest breaks, repositioning, and therapeutic support to optimize participation in therapy session. Focus this session BADL retraining, UB strengthening, and family education.  Pt requesting to use restroom at beginning of session. Pt able to propel wc into bathroom and set-up for wc with min verbal cueing for positioning. Pt able to "coach" OT through how to assist with setting up for transfer and advocate for assistance to simulate communicating needs to caregiver upon d/c. Pt utilized grab bars to complete stand pivot transfer with light CGA for positioning. Pt able to perform anterior peri-care and clothing management using grab bars CGA. Stand pivot toilet>wc using grab bars CGA. Pt propelled wc to sink and washed hands in sitting.   Pt propelled wc to therapy gym using hemi-technique with supervision for functional mobility and endurance training. Min verbal cues required during task to avoid hitting RUE on doorway.   Engaged Pt in seated UB there-ex using 8# weighted dowel to increased overall strength, endurance, and increase BUE reciprocal arm movements. Pt able to complete 3x10 reps of bicep curls, chest press, overhead press, and anterior deltoid raises with short rest breaks provided between sets.   Pt's caregiver present for remainder of therapy session and motivated to participate in hands-on family education. Pt propelled his wc to ADL apartment ~150 ft with supervision. Discussed home set-up and  accessibility with education provided on fall prevention, energy conservation, and simple modifications with Pt and caregiver able to verbalize understanding. Pt's caregiver reporting bathrooms and tub/shower are both accessible via wc and equip with grab bars to increase Pt's safety. Provided education on bathroom safety and TTB transfers with demonstration provided. Facilitated Pt's caregiver providing hands-on assistance during partial stand pivot transfers wc<>TTB to support learning and carryover upon d/c, Skilled feedback provided on technique, positioning, and body mechanics with additional opportunities for practice provided with noted improvement. Pt and caregiver reporting they feel confident in completing TTB transfers at d/c and that all questions were answered at end of session.   Pt propelled his wc to his room. Pt was left resting in wc with call bell in reach and all needs met.    Therapy Documentation Precautions:  Precautions Precautions: Fall Precaution Comments: right sided hemiparesis, expressive deficits Restrictions Weight Bearing Restrictions: No  Therapy/Group: Individual Therapy  Army Fossa 08/29/2022, 7:58 AM

## 2022-08-29 NOTE — Progress Notes (Signed)
PROGRESS NOTE   Subjective/Complaints:  Wet bed last noc was aware but did not notify staff until this am  ROS: Patient denies CP, SOB, abdominal pain,  N/V/D/C.      Objective:   No results found. Recent Labs    08/28/22 0728  WBC 5.4  HGB 12.7*  HCT 38.6*  PLT 182       Recent Labs    08/28/22 0728  NA 140  K 4.3  CL 110  CO2 22  GLUCOSE 108*  BUN 78*  CREATININE 3.66*  CALCIUM 9.3       Intake/Output Summary (Last 24 hours) at 08/29/2022 0825 Last data filed at 08/29/2022 0709 Gross per 24 hour  Intake 828 ml  Output 1050 ml  Net -222 ml         Physical Exam: Vital Signs Blood pressure 127/86, pulse 64, temperature 98 F (36.7 C), temperature source Oral, resp. rate 20, height 5\' 9"  (1.753 m), weight 104.8 kg, SpO2 100 %.    General: No acute distress, laying in bed, appears comfortable  Mood and affect are appropriate Heart: RRR no murmur  Lungs: Clear to auscultation,no increased WOB Abdomen: Positive bowel sounds, soft nontender to palpation, nondistended Extremities: No clubbing, cyanosis, or edema Skin: No evidence of breakdown, no evidence of rash, calluses on knees   Neurologic:Alert and oriented x 3. Follows commands, Normal insight and awareness. Intact Memory. Normal language and speech. Cranial nerve exam unremarkable. MMT: Left-sided strength is normal RUE 4 prox to distal. RLE trace hip add , 0/5 HF, KE, KF and 0/5 distally. Sensory exam normal sensation to light touch and proprioception in bilateral upper and lower extremities  Musculoskeletal: Full range of motion in all 4 extremities. No joint swelling     Assessment/Plan: 1. Functional deficits which require 3+ hours per day of interdisciplinary therapy in a comprehensive inpatient rehab setting. Physiatrist is providing close team supervision and 24 hour management of active medical problems listed below. Physiatrist  and rehab team continue to assess barriers to discharge/monitor patient progress toward functional and medical goals  Care Tool:  Bathing    Body parts bathed by patient: Face, Right arm, Left arm, Chest, Abdomen, Front perineal area, Buttocks, Right upper leg, Left upper leg, Left lower leg   Body parts bathed by helper: Right lower leg     Bathing assist Assist Level: Minimal Assistance - Patient > 75%     Upper Body Dressing/Undressing Upper body dressing   What is the patient wearing?: Pull over shirt    Upper body assist Assist Level: Set up assist    Lower Body Dressing/Undressing Lower body dressing      What is the patient wearing?: Pants, Incontinence brief     Lower body assist Assist for lower body dressing: Minimal Assistance - Patient > 75%     Toileting Toileting    Toileting assist Assist for toileting: Moderate Assistance - Patient 50 - 74%     Transfers Chair/bed transfer  Transfers assist  Chair/bed transfer activity did not occur: N/A  Chair/bed transfer assist level: Contact Guard/Touching assist (squat pivot)     Locomotion Ambulation   Ambulation assist  Assist level: Minimal Assistance - Patient > 75% Assistive device: Walker-rolling Max distance: 139ft   Walk 10 feet activity   Assist  Walk 10 feet activity did not occur: Safety/medical concerns  Assist level: Minimal Assistance - Patient > 75% Assistive device: Walker-rolling   Walk 50 feet activity   Assist Walk 50 feet with 2 turns activity did not occur: Safety/medical concerns  Assist level: Minimal Assistance - Patient > 75% Assistive device: Walker-rolling    Walk 150 feet activity   Assist Walk 150 feet activity did not occur: Safety/medical concerns         Walk 10 feet on uneven surface  activity   Assist Walk 10 feet on uneven surfaces activity did not occur: Safety/medical concerns         Wheelchair     Assist Is the patient  using a wheelchair?: Yes Type of Wheelchair: Manual    Wheelchair assist level: Supervision/Verbal cueing Max wheelchair distance: 139ft    Wheelchair 50 feet with 2 turns activity    Assist        Assist Level: Supervision/Verbal cueing   Wheelchair 150 feet activity     Assist      Assist Level: Supervision/Verbal cueing   Blood pressure 127/86, pulse 64, temperature 98 F (36.7 C), temperature source Oral, resp. rate 20, height 5\' 9"  (1.753 m), weight 104.8 kg, SpO2 100 %.  Medical Problem List and Plan: 1. 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  Antithrombotics: -DVT/anticoagulation:  amb >100' daily off heparin              -antiplatelet therapy: Aspirin 81 mg daily and Brilinta 90 mg twice daily 3. Pain Management: Tylenol as needed 4. Mood/Behavior/Sleep: Provide emotional support             -antipsychotic agents: N/A 5. Neuropsych/cognition: This patient is not capable of making decisions on his own behalf. 6. Skin/Wound Care: Routine skin checks 7. Fluids/Electrolytes/Nutrition: Routine in and outs with follow-up chemistries D/c CBG   8.  Seizure prophylaxis.  EEG negative.  Continue Keppra 500 mg daily 9.  AKI on CKD stage IV.  Follow-up chemistries.  Follow-up outpatient nephrology services             -pt given lokelma prior to rehab admit             Nephro signed off 5/24  -on Renvela and sodium bicarb -6/1-2/24 wt slightly uptrending but mostly stable, monitor routine labs  Filed Weights   08/26/22 0558 08/27/22 0414 08/29/22 0500  Weight: 104.7 kg 105.3 kg 104.8 kg        Latest Ref Rng & Units 08/28/2022     7:28 AM 08/21/2022    5:51 AM 08/18/2022    5:28 AM  BMP  Glucose 70 - 99 mg/dL 528  413  244   BUN 6 - 20 mg/dL 78  010  272   Creatinine 0.61 - 1.24 mg/dL 5.36  6.44  0.34   Sodium 135 - 145  mmol/L 140  139  137   Potassium 3.5 - 5.1 mmol/L 4.3  4.7  4.8   Chloride 98 - 111 mmol/L 110  106  102   CO2 22 - 32 mmol/L 22  20  22    Calcium 8.9 - 10.3 mg/dL 9.3  9.2  9.6     Improving  10.  Hyperlipidemia.  Crestor 20mg  QD 11.  Permissive hypertension.  Patient on amlodipine 10 mg daily, hydralazine 50 mg twice daily, labetalol 200 mg twice daily prior to admission.  Resume as needed  -6/1-2/24 BP well controlled off meds Vitals:   08/25/22 1424 08/25/22 2021 08/26/22 0558 08/26/22 1334  BP: (!) 128/93 116/77 123/85 133/87   08/26/22 2006 08/27/22 0414 08/27/22 1257 08/27/22 1926  BP: (!) 148/92 117/83 131/84 138/84   08/28/22 0338 08/28/22 1343 08/28/22 2036 08/29/22 0402  BP: (!) 130/91 (!) 131/97 (!) 141/90 127/86    12.  Medical noncompliance.  Provide counseling 13. Constipation/incontinence of bowel: -5/27 increase miralax to BID, 5/29 small BM yesterday but none x several days  -08/26/22 no BM in 2 days but this is typical for him; continue Miralax BID, SenokotS 2 tabs BID, Sorbitol PRN for now, if no BM tomorrow may consider further increase in regimen.  -LBM 6/2 cont current tx  LOS: 28 days A FACE TO FACE EVALUATION WAS PERFORMED  Erick Colace 08/29/2022, 8:25 AM

## 2022-08-29 NOTE — Progress Notes (Signed)
Physical Therapy Session Note  Patient Details  Name: Joseph Bright MRN: [id number] Date of Birth: 1975-06-30  Today's Date: 08/29/2022 PT Individual Time: 6962-9528 and 4132-4401 PT Individual Time Calculation (min): 61 min and 62 min  Short Term Goals: Week 4:  PT Short Term Goal 1 (Week 4): = to LTGs based on ELOS  Skilled Therapeutic Interventions/Progress Updates:    Session 1: Pt received sitting in w/c and agreeable to therapy session. Donned R LE [place] Walk-on PLS AFO.   L hemi-technique w/c propulsion ~160ft mod-I to main therapy gym. Worked on pt's management of R LE on/off w/c leg rests and using RUE to doff leg rests to increase pt's independence with mobility from w/c level.  Sit<>stands to RW with close supervision/guarding during session - pt managing R hand on-off RW orthosis without assist. Therapist provided pt with new RW orthosis to have at D/C.  Donned shoe cover to create toe cap on R foot to improve swing phase advancement during gait.  Gait training 26ft x2 + ~136ft using RW with min assist of 1 for balance and R LE management from a stool. Pt demonstrating the following gait deviations with therapist providing the described cuing and facilitation for improvement:  - pt able to advance R LE COMPLETELY INDEPENDENTLY, no assist, for initial 10ft of 1st gait training trial, did keep LE in excessive external rotation due to toe drag from lack of foot clearance, but achieves at least step-to and intermittent reciprocal stepping pattern - donned 4lb ankle weight on R LE for second gait trial to promote increased neural recruitment, but pt unable to advance R LE without assist, but is still able to initiate it - continues to have some vaulting on L toes during R swing to improve advancement, but cuing to avoid this compensation - continues to have great R stance control, but does keep R knee in partially flexed position  - therapist tactily feeling of R LE muscle  activation to cue for flexors to turn on during swing, rather than extensors (quads) due to pt having difficulty with motor planning and contracting proper muscle groups - continued cuing to ensure full L weight shift onto L stance limb  Based on pt's continued progress with gait training discussed with pt the option of advocating for an extension in his inpatient rehab length of stay; however, pt reports he would prefer to be able to D/C on planned date. Therapist educated him on the change in amount of therapy he will be receiving after D/C and how this may impact his continued progress; however, pt still requesting to move forward with current D/C plan. Discussed that if his family demonstrates safe ability to assist him with short distance, intentional gait during hands-on family education then it would be beneficial for him to be able to do this at D/C rather than only having an opportunity to walk during PT sessions.  Reached out to [place], [facility] to arrange pt getting proper AFO and leather toe cap for his shoe prior to D/C.  L hemi-technique w/c propulsion back to his room mod-I. Pt reports need to use bathroom. Sit<>stand w/c<>RW with close supervision/CGA and able to use urinal with set-up assist, continent void of bladder.  At end of session, pt left seated in w/c with needs in reach.   Session 2: Pt received sitting in his w/c with no family present for education/training. Pt reports that [name] had to leave to go pick-up their kid from school and  would be back. Therapist offered to adjust therapy schedule and return in to allow more time for her to participate in hands-on education/training and pt in agreement. Pt left seated in w/c with needs in reach. Therapist returned, but then had to rearrange schedule again and arrive 1hour later due to [name] being unavailable until 3:30pm.  Therapist arrived to pt's room and he is sitting in w/c with [name] present for hands-on  family education/training.   Had pt and family demonstrate how to safely perform bed<>wheelchair squat/partial stand pivot transfers without therapist cuing - pt/family demo proper management of w/c parts with [name] reporting she feels very comfortable managing wheelchairs - [name] recalls needs to use gait belt without cuing - she demos proper assistance without cuing.   Discussed bed set-up with pt reporting plan to enter/exit bed on L side - educated [name] on recommendation to place bedrail on L upper side of the bed to increase pt's independence with bed mobility. Pt performed supine<>sit using bedrail with therapist educating him on "hooking" R LE with LLE and using trunk momentum to assist with bringing BLEs up onto the bed - performed x2 reps and pt able to complete with supervision on 2nd trial.   Pt performed L hemi-propulsion to/from main gym mod-I. [name] reports pt's new 1st floor, level entry apartment is wheelchair accessible.  Educated her that if she demonstrates safe assistance technique, then pt can participate in short distance ambulation using RW in controlled environment at home with her and a +2 assist present. Educated that pt will be getting an AFO and that a leather toe cap will be placed on his shoe.  Pt performs sit<>stands w/c<>RW with CGA/close supervision and [name] demonstrating assistance to properly position R LE with only intermittent verbal cuing.  Pt ambulated ~70ft using RW with therapist assistance to demonstrate proper assistance technique using gait belt - pt able to advance R LE 100% of the distance without physical assistance and achieving reciprocal step >75% of the time. Therapist did demonstrate how to use AFO to assist pt with swing advancement, if needed, from a standing position. Therapist educated on safe home set-up of short, straight distance in hallway with walls on either side for extra safety and having a chair set-up on either end to perform R  turns to sit down, allowing increased safety when turning. Educated that if pt is fatigued or feeling unsafe then have +2 assist bring wheelchair up behind pt to sit. Pt also has demonstrated great safety awareness with gait training and can guide [name] on how to increase his safety.   Pt ambulated ~36ft + ~46ft x2 using RW with [name] providing hands-on assistance and therapist providing cuing/education throughout on how to improve her technique when assisting pt to advance R LE during swing to improve pt and her safety. Educated her on how to time her assistance and how far to advance his R foot to keep him safe, especially when turning. Educated them both to ensure they are timing their steps together.   [name] demonstrated safe technique to assist pt with ambulation at home and she reports understanding only to do this when +2 assist is present and if/when pt is feeling well rested so he can advance his R LE mostly without her assistance.   Pt and [name] report no specific questions/concerns at this time and she reports feeling confident with education/training provided thus far.  At end of session, pt left seated in w/c with [name] and NT present  to assume care of pt.    Therapy Documentation Precautions:  Precautions Precautions: Fall Precaution Comments: right sided hemiparesis, expressive deficits Restrictions Weight Bearing Restrictions: No   Pain: Session 1: Denies pain during session.  Session 2: Denies pain during session.    Therapy/Group: Individual Therapy  Ginny Forth , PT, DPT, NCS, CSRS 08/29/2022, 7:55 AM

## 2022-08-30 ENCOUNTER — Other Ambulatory Visit (HOSPITAL_COMMUNITY): Payer: Self-pay

## 2022-08-30 MED ORDER — LEVETIRACETAM ER 500 MG PO TB24
500.0000 mg | ORAL_TABLET | Freq: Every day | ORAL | 0 refills | Status: DC
  Filled 2022-08-30: qty 30, 30d supply, fill #0

## 2022-08-30 MED ORDER — TICAGRELOR 90 MG PO TABS
90.0000 mg | ORAL_TABLET | Freq: Two times a day (BID) | ORAL | 0 refills | Status: DC
  Filled 2022-08-30: qty 60, 30d supply, fill #0

## 2022-08-30 MED ORDER — AMLODIPINE BESYLATE 10 MG PO TABS
5.0000 mg | ORAL_TABLET | Freq: Every day | ORAL | 1 refills | Status: DC
  Filled 2022-08-30: qty 1, 2d supply, fill #0

## 2022-08-30 MED ORDER — ASPIRIN 81 MG PO CHEW: 81.0000 mg | CHEWABLE_TABLET | Freq: Every day | ORAL | Status: DC

## 2022-08-30 MED ORDER — SODIUM BICARBONATE 650 MG PO TABS
1300.0000 mg | ORAL_TABLET | Freq: Two times a day (BID) | ORAL | 0 refills | Status: DC
  Filled 2022-08-30: qty 120, 30d supply, fill #0

## 2022-08-30 MED ORDER — AMLODIPINE BESYLATE 5 MG PO TABS
5.0000 mg | ORAL_TABLET | Freq: Every day | ORAL | Status: DC
  Administered 2022-08-30 – 2022-08-31 (×2): 5 mg via ORAL
  Filled 2022-08-30 (×2): qty 1

## 2022-08-30 MED ORDER — ACETAMINOPHEN 325 MG PO TABS: 650.0000 mg | ORAL_TABLET | ORAL | Status: DC | PRN

## 2022-08-30 MED ORDER — ROSUVASTATIN CALCIUM 20 MG PO TABS
20.0000 mg | ORAL_TABLET | Freq: Every day | ORAL | 0 refills | Status: DC
  Filled 2022-08-30: qty 30, 30d supply, fill #0

## 2022-08-30 MED ORDER — SEVELAMER CARBONATE 800 MG PO TABS
800.0000 mg | ORAL_TABLET | Freq: Three times a day (TID) | ORAL | 0 refills | Status: DC
  Filled 2022-08-30: qty 90, 30d supply, fill #0

## 2022-08-30 NOTE — Discharge Summary (Signed)
Physical Therapy Discharge Summary  Patient Details  Name: Joseph Bright MRN: [id number] Date of Birth: Dec 27, 1975  Date of Discharge from PT service:August 30, 2022  Today's Date: 08/30/2022 PT Individual Time: 4098-1191 and 1753-1801 PT Individual Time Calculation (min): 57 min and 8 min   Patient has met 8 of 8 long term goals due to improved activity tolerance, improved balance, improved postural control, increased strength, ability to compensate for deficits, functional use of  right upper extremity and right lower extremity, improved attention, improved awareness, and improved coordination.  Patient to discharge at a wheelchair level mod-I; however, requiring  CGA  for squat pivot transfers.   Patient's care partner attended hands-on education/training and is independent to provide the necessary physical assistance at discharge.  All goals met.  Recommendation:  Patient will benefit from ongoing skilled PT services in outpatient setting to continue to advance safe functional mobility, address ongoing impairments in R hemibody NMR, dynamic standing balance, R LE strengthening, gait training, stair navigation training, and minimize fall risk.  Equipment: RW, custom manual wheelchair through [place], and bedrails  Reasons for discharge: treatment goals met and discharge from hospital  Patient/family agrees with progress made and goals achieved: Yes  Skilled Therapeutic Interventions/Progress Updates:  Session 1: Pt received sitting in w/c and agreeable to therapy session. L hemi-technique w/c propulsion mod-I to main therapy gym - pt demos some R inattention bumping or getting close to doorways or other items on that side during propulsion - educated pt to monitor to ensure he is not allowing his R hand to be vulnerable to getting trapped or hit on something.   Pt had received his personal R LE [place] Walk-on PLS AFO and his shoe with R leather toe cap.   Sit<>stands w/c>RW with  CGA/close supervision and pt managing R hand on/off orthosis without assist. Gait training ~9ft using RW with min assist for R LE management to assist with swing phase advancement - continues to vault on L toes during L stance to compensate for lack of strength to advance R LE during swing - R LE excessive hip external rotation due to toes dragging and/or in conjunction with excessive hip adduction due to pt using adductors to assist with advancement because of flexor weakness.   Stair navigation training ascending/descending 8 steps (6" height) using L UE support on each HR with very skilled min assist for R LE advancement and stability - reciprocal pattern on ascent for R LE NMR requiring total assist manual facilitation to advance R LE onto next step followed by max/total guarding of R knee while pushing up through it to place L LE onto next step (pt continues to compensate partially by leaning onto L forearm on handrail for support); step-to pattern on descent with mod assist to advance R LE fully and then pt able to stabilize during stance phase.  Pt continues to have difficulty focusing attention on movement in R LE while avoiding focusing attention on compensating with L hemibody.   Simulated car (sedan height) transfer training via stand pivot to/from w/c with pt requiring min assist for R LE stepping during transfer and for placing it in/out of vehicle.  L hemi-technique w/c propulsion up/down ramp x2 reps with pt requiring min assist initially for ascent and then CGA on 2nd trial once pt learning how to coordinate UE and LE push-strokes to keep him moving forward without veering R.   Olivia, CPO present to assess pt's gait mechanics and discuss any further bracing needs.  Gait training ~36ft as described above using RW with therapist discussing option of placing elevated sole in pt's L shoe to provide a functional leg length discrepancy to see if this would improve pt's R swing advancement with  decreased compensation via vaulting. CPO planning to bring supplies and provide later today.   Pt performed L hemi-technique propulsion back to room mod-I. Therapist ensured pt could access HEP provided earlier today. Pt left seated in w/c with needs in reach.   Session 2: Pt received sitting in w/c with Zollie Scale, CPO having brought the sole for his L shoe and inserted it to provide pt with a functional leg length discrepancy to see if this would help him improve R LE foot clearance during swing while decreasing his tendency to vault on L toes to compensate. Sit<>stand w/c<>RW with CGA for safety. Pt wearing his personal R LE [place] Walk-on PLS AFO. Gait training ~76ft using RW with min assist of 1 primarily for R LE swing phase advancement - pt continues to want to vault on L toes during stance, but cuing to avoid this compensation, min/mod assist needed to advance and/or reposition R LE during swing due to pt tending to have excessive hip external rotation due to toes dragging and/or in conjunction with excessive hip adduction as pt likely compensates with hip adductor activation to advance limb due to lack of flexor activation. Pt does potentially have slight improvement in R limb advancement with the L LE made functionally "longer" - therapist did educate pt that this is not a long term recommendation, but rather just a tool to be used in the short term to allow him to walk more safely and be more successful with gait. At end of session, pt left seated in w/c with needs in reach.  PT Discharge Precautions/Restrictions Precautions Precautions: Fall;Other (comment) Precaution Comments: dense R hemi (LE>UE), expressive aphasia Restrictions Weight Bearing Restrictions: No Pain Pain Assessment Pain Scale: 0-10 Pain Score: 0-No pain Pain Interference Pain Interference Pain Effect on Sleep: 0. Does not apply - I have not had any pain or hurting in the past 5 days Pain Interference with Therapy  Activities: 1. Rarely or not at all Pain Interference with Day-to-Day Activities: 1. Rarely or not at all Vision/Perception  Vision - History Ability to See in Adequate Light: 0 Adequate Perception Perception: Impaired Inattention/Neglect: Does not attend to right side of body;Does not attend to right visual field (however improved from initial eval) Praxis Praxis: Impaired Praxis Impairment Details: Motor planning (presents more with impaired activation of proper muscle groups; motor planning mostly intact for functional tasks)  Cognition Overall Cognitive Status: Within Functional Limits for tasks assessed Arousal/Alertness: Awake/alert Orientation Level: Oriented X4 Attention: Sustained;Selective Sustained Attention: Appears intact Selective Attention: Appears intact Memory: Appears intact Awareness: Appears intact Safety/Judgment: Appears intact Sensation Sensation Light Touch: Impaired Detail Light Touch Impaired Details: Impaired RLE;Impaired RUE Hot/Cold: Not tested Proprioception: Impaired Detail Proprioception Impaired Details: Impaired RUE;Impaired RLE Stereognosis: Not tested Coordination Gross Motor Movements are Fluid and Coordinated: No Coordination and Movement Description: continues with dense R hemiplegia (LE>UE) with more return in UE but improved since initial eval Motor  Motor Motor: Motor apraxia;Abnormal postural alignment and control;Abnormal tone;Hemiplegia Motor - Discharge Observations: R hemi (UE>LE), significantly improved since inital eval  Mobility Bed Mobility Bed Mobility: Supine to Sit;Sit to Supine (using a bedrail and exiting/entering from L EOB) Rolling Left: Supervision/Verbal cueing Supine to Sit: Supervision/Verbal cueing Sit to Supine: Supervision/Verbal cueing Transfers Transfers: Sit to Stand;Stand  to Sit;Stand Pivot Transfers;Squat Pivot Transfers Sit to Stand: Contact Guard/Touching assist Stand to Sit: Contact Guard/Touching  assist Stand Pivot Transfers: Minimal Assistance - Patient > 75% (for stepping R LE) Stand Pivot Transfer Details: Verbal cues for safe use of DME/AE;Verbal cues for precautions/safety;Verbal cues for sequencing;Manual facilitation for placement;Verbal cues for technique;Verbal cues for gait pattern Squat Pivot Transfers: Contact Guard/Touching assist Transfer (Assistive device): Rolling walker (for stand pivot) Locomotion  Gait Ambulation: Yes Gait Assistance: Minimal Assistance - Patient > 75% Gait Distance (Feet): 200 Feet (consistently reaches 33ft but can go up to 275ft) Assistive device: Rolling walker;Other (Comment) (R UE hand orthosis, R LE [place] Walk-on PLS AFO, R shoe leather toe cap) Gait Assistance Details: Verbal cues for precautions/safety;Verbal cues for safe use of DME/AE;Manual facilitation for placement;Tactile cues for sequencing;Visual cues for safe use of DME/AE;Visual cues/gestures for precautions/safety;Verbal cues for sequencing;Verbal cues for technique;Verbal cues for gait pattern (manual assist to advance R LE during swing) Gait Gait: Yes Gait Pattern: Impaired Gait Pattern: Poor foot clearance - right;Decreased stance time - right;Decreased step length - left;Decreased step length - right;Step-to pattern;Step-through pattern;Decreased hip/knee flexion - right;Decreased dorsiflexion - right;Narrow base of support (good R knee control during stance but stays partially flexed; lacks R foot clearance during swing causing R LE to pull into excessive hip external rotation or compensates by vaulting on L toes to kick R foot through using adductors causing narrow BOS) Stairs / Additional Locomotion Stairs: Yes Stairs Assistance: Minimal Assistance - Patient > 75% Stair Management Technique: Two rails;Step to pattern;Forwards Number of Stairs: 8 Height of Stairs: 6 Pick up small object from the floor assist level: Moderate Assistance - Patient 50 - 74% Pick up small  object from the floor assistive device: RW Wheelchair Mobility Wheelchair Mobility: Yes Wheelchair Assistance: Independent with assistive device Wheelchair Propulsion: Left upper extremity;Left lower extremity Wheelchair Parts Management: Independent (now able to get R LE on/off leg rest and doff R leg rest without assist) Distance: >128ft  Trunk/Postural Assessment  Cervical Assessment Cervical Assessment: Within Functional Limits Thoracic Assessment Thoracic Assessment: Exceptions to [facility] (rounded shoulders) Lumbar Assessment Lumbar Assessment: Exceptions to [facility] (posterior pelvic tilt) Postural Control Postural Control: Deficits on evaluation Trunk Control: requires RW for stability in standing with assist Protective Responses: delayed, improvement from evaluation  Balance Balance Balance Assessed: Yes Static Sitting Balance Static Sitting - Balance Support: No upper extremity supported;Feet supported Static Sitting - Level of Assistance: 7: Independent Dynamic Sitting Balance Dynamic Sitting - Balance Support: During functional activity;Feet supported Dynamic Sitting - Level of Assistance: 5: Stand by assistance Static Standing Balance Static Standing - Balance Support: During functional activity;Bilateral upper extremity supported Static Standing - Level of Assistance: 5: Stand by assistance;Other (comment) (CGA) Dynamic Standing Balance Dynamic Standing - Balance Support: During functional activity;Bilateral upper extremity supported Dynamic Standing - Level of Assistance: 4: Min assist Dynamic Standing - Comments: ADLs Extremity Assessment      RLE Assessment RLE Assessment: Exceptions to [facility] Passive Range of Motion (PROM) Comments: WFL; however, notice some increased tone and muscle tightness in hamstrings and plantarflexors; tends to rest in excessive hip external rotation (will need proper positioning to avoid muscle tightness) General Strength Comments: pt unable to  activate R LE muscles in close chain except for hamstrings, but during functional mobility such as gait training pt demos activation in quads and hip flexors RLE Strength Right Hip Flexion: 0/5 (possible slight activation noted during gait training to advance R LE during swing) Right  Knee Flexion: 2+/5 Right Knee Extension: 3/5 (no active movement in open chain; however, strong enough to prevent buckle during stance phase of gait training) Right Ankle Dorsiflexion: 0/5 (no activation at ankle) Right Ankle Plantar Flexion: 0/5 (no activation at ankle) RLE Tone RLE Tone Comments: + clonus; flexor tone with hamstrings (also possibly motor planning impairment with pt activating incorrect muscle at wrong time) LLE Assessment LLE Assessment: Within Functional Limits Active Range of Motion (AROM) Comments: WFL/WNL General Strength Comments: 5/5 throughout assessed in sitting and functionally   Ginny Forth , PT, DPT, NCS, CSRS 08/30/2022, 9:26 AM

## 2022-08-30 NOTE — Plan of Care (Signed)
  Problem: RH Balance Goal: LTG: Patient will maintain dynamic sitting balance (OT) Description: LTG:  Patient will maintain dynamic sitting balance with assistance during activities of daily living (OT) Outcome: Completed/Met Goal: LTG Patient will maintain dynamic standing with ADLs (OT) Description: LTG:  Patient will maintain dynamic standing balance with assist during activities of daily living (OT)  Outcome: Completed/Met   Problem: Sit to Stand Goal: LTG:  Patient will perform sit to stand in prep for activites of daily living with assistance level (OT) Description: LTG:  Patient will perform sit to stand in prep for activites of daily living with assistance level (OT) Outcome: Completed/Met   Problem: RH Bathing Goal: LTG Patient will bathe all body parts with assist levels (OT) Description: LTG: Patient will bathe all body parts with assist levels (OT) Outcome: Completed/Met   Problem: RH Dressing Goal: LTG Patient will perform upper body dressing (OT) Description: LTG Patient will perform upper body dressing with assist, with/without cues (OT). Outcome: Completed/Met Goal: LTG Patient will perform lower body dressing w/assist (OT) Description: LTG: Patient will perform lower body dressing with assist, with/without cues in positioning using equipment (OT) Outcome: Completed/Met   Problem: RH Toileting Goal: LTG Patient will perform toileting task (3/3 steps) with assistance level (OT) Description: LTG: Patient will perform toileting task (3/3 steps) with assistance level (OT)  Outcome: Completed/Met   Problem: RH Toilet Transfers Goal: LTG Patient will perform toilet transfers w/assist (OT) Description: LTG: Patient will perform toilet transfers with assist, with/without cues using equipment (OT) Outcome: Completed/Met   Problem: RH Tub/Shower Transfers Goal: LTG Patient will perform tub/shower transfers w/assist (OT) Description: LTG: Patient will perform tub/shower  transfers with assist, with/without cues using equipment (OT) Outcome: Completed/Met   Problem: RH Memory Goal: LTG Patient will demonstrate ability for day to day recall/carry over during activities of daily living with assistance level (OT) Description: LTG:  Patient will demonstrate ability for day to day recall/carry over during activities of daily living with assistance level (OT). Outcome: Completed/Met

## 2022-08-30 NOTE — Progress Notes (Addendum)
PROGRESS NOTE   Subjective/Complaints: Discussed BP creeping up but has not been on BP meds this hospitalization.  Was on 3 BP meds at home but with poor compliance, discussed diet as  well  ROS: Patient denies CP, SOB, abdominal pain,  N/V/D/C.      Objective:   No results found. Recent Labs    08/28/22 0728  WBC 5.4  HGB 12.7*  HCT 38.6*  PLT 182       Recent Labs    08/28/22 0728  NA 140  K 4.3  CL 110  CO2 22  GLUCOSE 108*  BUN 78*  CREATININE 3.66*  CALCIUM 9.3       Intake/Output Summary (Last 24 hours) at 08/30/2022 0810 Last data filed at 08/30/2022 1610 Gross per 24 hour  Intake 708 ml  Output 1150 ml  Net -442 ml         Physical Exam: Vital Signs Blood pressure (!) 133/92, pulse 63, temperature 97.8 F (36.6 C), temperature source Oral, resp. rate 17, height 5\' 9"  (1.753 m), weight 105.8 kg, SpO2 99 %.    General: No acute distress, laying in bed, appears comfortable  Mood and affect are appropriate Heart: RRR no murmur  Lungs: Clear to auscultation,no increased WOB Abdomen: Positive bowel sounds, soft nontender to palpation, nondistended Extremities: No clubbing, cyanosis, or edema Skin: No evidence of breakdown, no evidence of rash, calluses on knees   Neurologic:Alert and oriented x 3. Follows commands, Normal insight and awareness. Intact Memory. Normal language and speech. Cranial nerve exam unremarkable. MMT: Left-sided strength is normal RUE 4 prox to distal. RLE trace hip add , 0/5 HF, KE, KF and 0/5 distally. Sensory exam normal sensation to light touch and proprioception in bilateral upper and lower extremities  Musculoskeletal: Full range of motion in all 4 extremities. No joint swelling     Assessment/Plan: 1. Functional deficits which require 3+ hours per day of interdisciplinary therapy in a comprehensive inpatient rehab setting. Physiatrist is providing close team  supervision and 24 hour management of active medical problems listed below. Physiatrist and rehab team continue to assess barriers to discharge/monitor patient progress toward functional and medical goals  Care Tool:  Bathing    Body parts bathed by patient: Face, Right arm, Left arm, Chest, Abdomen, Front perineal area, Buttocks, Right upper leg, Left upper leg, Left lower leg   Body parts bathed by helper: Right lower leg     Bathing assist Assist Level: Minimal Assistance - Patient > 75%     Upper Body Dressing/Undressing Upper body dressing   What is the patient wearing?: Pull over shirt    Upper body assist Assist Level: Set up assist    Lower Body Dressing/Undressing Lower body dressing      What is the patient wearing?: Pants, Incontinence brief     Lower body assist Assist for lower body dressing: Minimal Assistance - Patient > 75%     Toileting Toileting    Toileting assist Assist for toileting: Moderate Assistance - Patient 50 - 74%     Transfers Chair/bed transfer  Transfers assist  Chair/bed transfer activity did not occur: N/A  Chair/bed transfer assist  level: Contact Guard/Touching assist (squat pivot)     Locomotion Ambulation   Ambulation assist      Assist level: Minimal Assistance - Patient > 75% Assistive device: Walker-rolling Max distance: 111ft   Walk 10 feet activity   Assist  Walk 10 feet activity did not occur: Safety/medical concerns  Assist level: Minimal Assistance - Patient > 75% Assistive device: Walker-rolling   Walk 50 feet activity   Assist Walk 50 feet with 2 turns activity did not occur: Safety/medical concerns  Assist level: Minimal Assistance - Patient > 75% Assistive device: Walker-rolling    Walk 150 feet activity   Assist Walk 150 feet activity did not occur: Safety/medical concerns         Walk 10 feet on uneven surface  activity   Assist Walk 10 feet on uneven surfaces activity did not  occur: Safety/medical concerns         Wheelchair     Assist Is the patient using a wheelchair?: Yes Type of Wheelchair: Manual    Wheelchair assist level: Supervision/Verbal cueing Max wheelchair distance: 197ft    Wheelchair 50 feet with 2 turns activity    Assist        Assist Level: Supervision/Verbal cueing   Wheelchair 150 feet activity     Assist      Assist Level: Supervision/Verbal cueing   Blood pressure (!) 133/92, pulse 63, temperature 97.8 F (36.6 C), temperature source Oral, resp. rate 17, height 5\' 9"  (1.753 m), weight 105.8 kg, SpO2 99 %.  Medical Problem List and Plan: 1. 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  Antithrombotics: -DVT/anticoagulation:  amb >100' daily off heparin              -antiplatelet therapy: Aspirin 81 mg daily and Brilinta 90 mg twice daily 3. Pain Management: Tylenol as needed 4. Mood/Behavior/Sleep: Provide emotional support             -antipsychotic agents: N/A 5. Neuropsych/cognition: This patient is not capable of making decisions on his own behalf. 6. Skin/Wound Care: Routine skin checks 7. Fluids/Electrolytes/Nutrition: Routine in and outs with follow-up chemistries D/c CBG   8.  Seizure prophylaxis.  EEG negative.  Continue Keppra 500 mg daily 9.  AKI on CKD stage IV.  Follow-up chemistries.  Follow-up outpatient nephrology services             -pt given lokelma prior to rehab admit             Nephro signed off 5/24  -on Renvela and sodium bicarb -6/1-2/24 wt slightly uptrending but mostly stable, monitor routine labs  Filed Weights   08/29/22 0500 08/29/22 1557 08/30/22  0500  Weight: 104.8 kg 108.1 kg 105.8 kg        Latest Ref Rng & Units 08/28/2022    7:28 AM 08/21/2022    5:51 AM 08/18/2022    5:28 AM  BMP  Glucose 70 - 99 mg/dL 629  528  413   BUN 6 - 20 mg/dL 78  111  114   Creatinine 0.61 - 1.24 mg/dL 1.61  0.96  0.45   Sodium 135 - 145 mmol/L 140  139  137   Potassium 3.5 - 5.1 mmol/L 4.3  4.7  4.8   Chloride 98 - 111 mmol/L 110  106  102   CO2 22 - 32 mmol/L 22  20  22    Calcium 8.9 - 10.3 mg/dL 9.3  9.2  9.6     Improving  10.  Hyperlipidemia.  Crestor 20mg  QD 11.  Permissive hypertension.  Patient on amlodipine 10 mg daily, hydralazine 50 mg twice daily, labetalol 200 mg twice daily prior to admission.  Resume as needed  -6/5 diastolic BP trending up , HR is in 60s , start amlodipine 5mg  qd, (home dose is 10mg  but may cut tab)  Vitals:   08/26/22 1334 08/26/22 2006 08/27/22 0414 08/27/22 1257  BP: 133/87 (!) 148/92 117/83 131/84   08/27/22 1926 08/28/22 0338 08/28/22 1343 08/28/22 2036  BP: 138/84 (!) 130/91 (!) 131/97 (!) 141/90   08/29/22 0402 08/29/22 1240 08/29/22 1940 08/30/22 0606  BP: 127/86 (!) 154/97 (!) 137/96 (!) 133/92    12.  Medical noncompliance.  Provide counseling 13. Constipation/incontinence of bowel: -5/27 increase miralax to BID, 5/29 small BM yesterday but none x several days  -08/26/22 no BM in 2 days but this is typical for him; continue Miralax BID, SenokotS 2 tabs BID, Sorbitol PRN for now, if no BM tomorrow may consider further increase in regimen.  -LBM 6/4 cont current tx  LOS: 29 days A FACE TO FACE EVALUATION WAS PERFORMED  Erick Colace 08/30/2022, 8:10 AM

## 2022-08-30 NOTE — Progress Notes (Signed)
Speech Language Pathology Discharge Summary  Patient Details  Name: Joseph Bright MRN: [id number] Date of Birth: 09-29-1975  Date of Discharge from SLP service:August 30, 2022  Today's Date: 08/30/2022 SLP Individual Time: 8295-6213 SLP Individual Time Calculation (min): 43 min   Skilled Therapeutic Interventions: Pt was seen in am to address expressive and receptive language. Pt was alert and seated upright in WC upon SLP arrival. SLP challenged pt in abstract conversational speaking task. SLP reviewed compensatory language strategies including circumlocution and substitution strategies. Pt engaged in conversational exchange with sup to min A for strategies during abstract conversations. Pt demonstrated ability to sustain a conversation and speak in complete sentences. In additional minutes of session, SLP discussed recommendations for outpatient SLP and HEP. Pt was left seated upright in WC with call button within reach. SLP to continue POC.    Patient has met 4 of 4 long term goals.  Patient to discharge at overall Supervision level.  Reasons goals not met:     Clinical Impression/Discharge Summary: Patient has made significant gains and has met 4 of 4 LTG's this admission due to improved expressive and receptive language. Pt is currently and overall Sup to Min A for language tasks with communication breakdowns observed with more complex abstract language. Pt/family education complete and pt will discharge home with 24 hour supervision from family. Pt would benefit from outpatient f/u SLP services to maximize expressive language in order to maximize his functional independence and reduce caregiver burden.  Care Partner:  Caregiver Able to Provide Assistance: Yes  Type of Caregiver Assistance: Cognitive;Physical  Recommendation:  Outpatient SLP;Home Health SLP;24 hour supervision/assistance  Rationale for SLP Follow Up: Maximize functional communication   Equipment: N/A   Reasons for  discharge: Discharged from hospital;Treatment goals met   Patient/Family Agrees with Progress Made and Goals Achieved: Yes    Renaee Munda 08/30/2022, 11:26 AM

## 2022-08-30 NOTE — Progress Notes (Signed)
Occupational Therapy Discharge Summary  Patient Details  Name: Joseph Bright MRN: [id number] Date of Birth: 1975-04-27  Date of Discharge from OT service:Jul 31, 2022  Today's Date: 08/30/2022 OT Individual Time: 8657-8469 OT Individual Time Calculation (min): 61 min    Patient has met 10 of 10 long term goals due to improved activity tolerance, improved balance, postural control, ability to compensate for deficits, functional use of  RIGHT upper and RIGHT lower extremity, improved attention, improved awareness, and improved coordination.  Patient to discharge at overall Supervision-CGA level.  Patient's care partner is independent to provide the necessary physical assistance at discharge.    Reasons goals not met: All goals met  Recommendation:  Patient will benefit from ongoing skilled OT services in outpatient setting to continue to advance functional skills in the area of BADL, iADL, Vocation, and Reduce care partner burden.  Equipment: DME provided through [facility]- recommended TTB and BSC  Reasons for discharge: treatment goals met and discharge from hospital  Patient/family agrees with progress made and goals achieved: Yes  OT Discharge Skilled Therapeutic Interventions/Progress Updates:  Pt received sitting up in wc presenting to be in good spirits receptive to skilled OT session reporting 0/10 pain- OT offering intermittent rest breaks, repositioning, and therapeutic support to optimize participation in therapy session. Pt requesting to use restroom and take shower this session. Pt transported to bathroom total A. Pt able to set-up wc for transfer with min verbal cues for safety. Stand pivot wc>toilet using grab bars CGA. Provided increased time on toilet d/t need for BM (continent void and BM documented in flowsheets). Pt able to stand and completed posterior peri care using grab bars for balance CGA. Stand pivot toilet>wc CGA. OT assisted with positioning Pt's wc for TTB transfer.  Stand pivot wc>TTB using grab bars CGA. Pt able to complete U/LB bathing seated in shower with supervision utilizing long-handled sponge to wash BLEs and back. Pt completed stand pivot TTB>wc CGA using grab bars with min verbal cues provided for safety and RLE awareness. Transported Pt back to room in wc. Pt completed grooming/hygiene tasks applying lotion and deodorant seated in wc supervision. Donned shirt set-up assist and pants CGA with increased time required for problem solving and utilizing hemi-dressing technique. Stood to bring pants to waist CGA with LUE supported on bed rail. Provided Pt handout with pictures and descriptions of there-putty exercises with Pt able to complete with min cueing for technique using yellow (medium resistance) putty. Also provided handout of simple activities to do to increase RUE functional use upon d/c using household items. Pt receptive to all education provided. Pt was left resting in wc with call bell in reach and all needs met.   Precautions/Restrictions  Precautions Precautions: Fall;Other (comment) Precaution Comments: dense R hemi (LE>UE), expressive aphasia Required Braces or Orthoses: Other Brace Other Brace: R AFO Restrictions Weight Bearing Restrictions: No General   Vital Signs Therapy Vitals Temp: 98.1 F (36.7 C) Pulse Rate: 65 Resp: 16 BP: (Abnormal) 156/98 Patient Position (if appropriate): Sitting Oxygen Therapy SpO2: 100 % O2 Device: Room Air Pain  0/10  ADL ADL Equipment Provided: Long-handled sponge Eating: Set up Where Assessed-Eating: Wheelchair Grooming: Supervision/safety Where Assessed-Grooming: Sitting at sink Upper Body Bathing: Supervision/safety Where Assessed-Upper Body Bathing: Shower Lower Body Bathing: Supervision/safety Where Assessed-Lower Body Bathing: Shower Upper Body Dressing: Supervision/safety Where Assessed-Upper Body Dressing: Wheelchair Lower Body Dressing: Contact guard Where Assessed-Lower  Body Dressing: Wheelchair Toileting: Contact guard Where Assessed-Toileting: Teacher, adult education: Furniture conservator/restorer  Method: Stand pivot, Squat pivot Toilet Transfer Equipment: Grab bars Tub/Shower Transfer: Scientific laboratory technician Method: Squat pivot, Stand pivot Tub/Shower Equipment: Acupuncturist: Administrator, arts Method: Squat pivot, Warden/ranger: Sales promotion account executive Baseline Vision/History: 1 Wears glasses (reading) Patient Visual Report: No change from baseline Vision Assessment?: No apparent visual deficits Eye Alignment: Within Functional Limits Ocular Range of Motion: Within Functional Limits Alignment/Gaze Preference: Within Defined Limits Tracking/Visual Pursuits: Able to track stimulus in all quads without difficulty Saccades: Within functional limits Convergence: Within functional limits Visual Fields: No apparent deficits Depth Perception: Overshoots (with RUE) Perception  Perception: Impaired Inattention/Neglect: Does not attend to right side of body;Does not attend to right visual field (improved from inital eval; requires verbal cueing) Praxis Praxis: Impaired Praxis Impairment Details: Motor planning (imparied activiation of correct muscle groups; motor planning intact for most tasks) Cognition Cognition Overall Cognitive Status: Within Functional Limits for tasks assessed Arousal/Alertness: Awake/alert Orientation Level: Person;Place;Situation Person: Oriented Place: Oriented Situation: Oriented Memory: Appears intact Attention: Sustained;Selective Sustained Attention: Appears intact Selective Attention: Appears intact Awareness: Appears intact Safety/Judgment: Appears intact Brief Interview for Mental Status (BIMS) Repetition of Three Words (First Attempt): 3 Temporal Orientation: Year: Correct Temporal Orientation: Month: Accurate within 5 days Temporal  Orientation: Day: Correct Recall: "Sock": Yes, no cue required Recall: "Blue": Yes, no cue required Recall: "Bed": Yes, no cue required BIMS Summary Score: 15 Sensation Sensation Light Touch: Impaired Detail Light Touch Impaired Details: Impaired RLE;Impaired RUE Hot/Cold: Appears Intact Proprioception: Impaired Detail Proprioception Impaired Details: Impaired RUE;Impaired RLE Stereognosis: Appears Intact Coordination Gross Motor Movements are Fluid and Coordinated: No Fine Motor Movements are Fluid and Coordinated: No Coordination and Movement Description: continues with dense R hemiplegia (LE>UE) with more return in UE but improved since initial eval Finger Nose Finger Test: slowed motor movements on RUE with mild undershooting Motor  Motor Motor: Motor apraxia;Abnormal postural alignment and control;Abnormal tone;Hemiplegia Motor - Discharge Observations: R hemi, significantly improved since inital eval Mobility  Bed Mobility Bed Mobility: Rolling Left;Sit to Supine;Supine to Sit Rolling Left: Supervision/Verbal cueing Supine to Sit: Contact Guard/Touching assist;Supervision/Verbal cueing Sit to Supine: Contact Guard/Touching assist;Supervision/Verbal cueing Transfers Sit to Stand: Contact Guard/Touching assist Stand to Sit: Contact Guard/Touching assist  Trunk/Postural Assessment  Cervical Assessment Cervical Assessment: Within Functional Limits Thoracic Assessment Thoracic Assessment: Within Functional Limits (rounded shoulders) Lumbar Assessment Lumbar Assessment: Within Functional Limits (posterior pelvic tilt) Postural Control Postural Control: Deficits on evaluation Trunk Control: requires RW for stability in standing Protective Responses: delayed, improvement from evaluation  Balance Balance Balance Assessed: Yes Static Sitting Balance Static Sitting - Balance Support: No upper extremity supported;Feet supported Static Sitting - Level of Assistance: 7:  Independent Dynamic Sitting Balance Dynamic Sitting - Balance Support: During functional activity Dynamic Sitting - Level of Assistance: 5: Stand by assistance (supervision) Static Standing Balance Static Standing - Level of Assistance: 5: Stand by assistance;4: Min assist (supervision-CGA) Dynamic Standing Balance Dynamic Standing - Level of Assistance: 5: Stand by assistance;4: Min assist (CGA) Dynamic Standing - Comments: ADLs Extremity/Trunk Assessment RUE Assessment RUE Assessment: Exceptions to [facility] Passive Range of Motion (PROM) Comments: WFL Active Range of Motion (AROM) Comments: mild limitation in supination and internal rotation Brunstrum level for arm: Stage V Relative Independence from Synergy Brunstrum level for hand: Stage VI Isolated joint movements LUE Assessment LUE Assessment: Within Functional Limits   Army Fossa 08/30/2022, 3:27 PM

## 2022-08-30 NOTE — Progress Notes (Signed)
Orthopedic Tech Progress Note Patient Details:  Joseph Bright 09/14/1975 [id number]  Called in order to [facility] for an AFO CONSULT   Patient ID: Joseph Bright, male   DOB: 06-Mar-1976, 47 y.o.   MRN: [id number]  Donald Pore 08/30/2022, 11:16 AM

## 2022-08-30 NOTE — Progress Notes (Addendum)
Patient ID: Joseph Bright, male   DOB: 1975-04-17, 47 y.o.   MRN: [id number]  Team Conference Report to Patient/Family  Team Conference discussion was reviewed with the patient and caregiver, including goals, any changes in plan of care and target discharge date.  Patient and caregiver express understanding and are in agreement.  The patient has a target discharge date of 08/31/22.  Sw met with patient and spoke with [place] via telephone to provide conference updates. Patient medically stable for d/c. Spouse expressed that she is assisting patient with ST disability through pt's job. Spouse will provide medical forms to SW or OP once received. All DME being provided through [facility].  No additional questions or concerns.  Andria Rhein 08/30/2022, 1:29 PM

## 2022-08-30 NOTE — Progress Notes (Signed)
Physical Therapy Session Note  Patient Details  Name: Joseph Bright MRN: [id number] Date of Birth: 03/30/1975  Today's Date: 08/30/2022 PT Individual Time: 0907-1002 PT Individual Time Calculation (min): 55 min   Short Term Goals: Week 4:  PT Short Term Goal 1 (Week 4): = to LTGs based on ELOS  Skilled Therapeutic Interventions/Progress Updates:    Pt received supine in bed awake and agreeable to therapy session. Block practice supine<>sit to/from L EOB using L bedrail to practice for home set-up with pt performing with supervision using L LE to assist with R LE management and L UE support on bedrail to assist with trunk control x2 reps. Sitting EOB with supervision for trunk control safety, donned shirt set-up assist, threaded on LB clothing with min assist for R LE, and donned shoes and R LE [place] Walk-on PLS AFO total assist. Sit>stand EOB>RW with CGA and pt able to pull pants up over hips without assist. Practiced R stand pivot transfer EOB>w/c using RW with min assist for R LE management to step it during transfer.   Pt able to manage R LE on/off w/c leg rests with increased time and able to manage doffing R w/c leg rest without assist.  Squat pivot w/c<>EOB with CGA.  Educated pt on recommendation for home exercise program to be at bed level to increase his safety and independence with working on R LE strength/NMR. Pt performed 1 set of each of the following exercises with therapist educating him on how to improve his motor control to activate correct muscle groups. Also, discussed idea of using the online videos for him to watch while doing the exercises to help with motor planning. Therapist having to provide manual facilitation assist to complete many of the exercises due to continued R LE plegia. Therapist provided written instructions on how [name] can assist him with the exercises at home.  Provided pt with HEP printout.  Access Code: [email or web address] URL:  [email or web address] Date: 08/30/2022 Prepared by: Casimiro Needle  Exercises - Hooklying Heel Slide  - 1 x daily - 7 x weekly - 2 sets - 10-15 reps - Supine Bridge  - 1 x daily - 7 x weekly - 2-3 sets - 15 reps - Supine Hamstring Stretch  - 1 x daily - 7 x weekly - 2 sets - 1 minute hold - Seated Hamstring Stretch  - 1 x daily - 7 x weekly - 2 sets - 1 minute hold - Clamshell  - 1 x daily - 7 x weekly - 2 sets - 10 reps - Sidelying Reverse Clamshell  - 1 x daily - 7 x weekly - 2 sets - 10 reps  At end of session, pt left seated in w/c with needs in reach.  Therapy Documentation Precautions:  Precautions Precautions: Fall Precaution Comments: right sided hemiparesis, expressive deficits Restrictions Weight Bearing Restrictions: No   Pain:  Denies pain during session.    Therapy/Group: Individual Therapy  Ginny Forth , PT, DPT, NCS, CSRS 08/30/2022, 7:56 AM

## 2022-08-30 NOTE — Progress Notes (Signed)
Inpatient Rehabilitation Discharge Medication Review by a Pharmacist  A complete drug regimen review was completed for this patient to identify any potential clinically significant medication issues.  High Risk Drug Classes Is patient taking? Indication by Medication  Antipsychotic No   Anticoagulant No   Antibiotic No   Opioid No   Antiplatelet Yes Ticagrelor, aspirin - s/p stent placement  Hypoglycemics/insulin No   Vasoactive Medication Yes Amlodipine- BP  Chemotherapy No   Other Yes Levetiracetam - seizure hx Rosuvastatin - HLD Sevelamer - CKD Sodium bicarb - CKD     Type of Medication Issue Identified Description of Issue Recommendation(s)  Drug Interaction(s) (clinically significant)     Duplicate Therapy     Allergy     No Medication Administration End Date     Incorrect Dose     Additional Drug Therapy Needed     Significant med changes from prior encounter (inform family/care partners about these prior to discharge).    Other       Clinically significant medication issues were identified that warrant physician communication and completion of prescribed/recommended actions by midnight of the next day:  No   Pharmacist comments: None  Time spent performing this drug regimen review (minutes):  20 minutes  Thank you Okey Regal, PharmD

## 2022-08-30 NOTE — Patient Care Conference (Signed)
Inpatient RehabilitationTeam Conference and Plan of Care Update Date: 08/30/2022   Time: 10:44 AM    Patient Name: Joseph Bright      Medical Record Number: [id number]  Date of Birth: [birth year] Sex: Male         Room/Bed: 4W07C/4W07C-01 Payor Info: Payor: CIGNA / Plan: CIGNA MANAGED / Product Type: *No Product type* /    Admit Date/Time:  08/01/2022  4:34 PM  Primary Diagnosis:  Cerebrovascular accident (CVA) due to occlusion of left posterior communicating artery [facility])  Hospital Problems: Principal Problem:   Cerebrovascular accident (CVA) due to occlusion of left posterior communicating artery (HCC) Active Problems:   Adjustment disorder with mixed anxiety and depressed mood    Expected Discharge Date: Expected Discharge Date: 08/31/22  Team Members Present: Physician leading conference: Dr. Claudette Laws Social Worker Present: Lavera Guise, BSW Nurse Present: Chana Bode, RN PT Present: Casimiro Needle, PT OT Present: Bonnell Public, OT SLP Present: Feliberto Gottron, SLP PPS Coordinator present : Edson Snowball, PT     Current Status/Progress Goal Weekly Team Focus  Bowel/Bladder   Pt is continent of bowel/bladder   Pt will remain continent of bowel/bladder   Will assess qshift and PRN    Swallow/Nutrition/ Hydration               ADL's   UB dressing using hemi-dressing technique supervision, LB CGA with increased time required, dyanmic standing balance during BADLs CGA, sit<>stands CGA, supervision U/LB bathing, TTB transfers CGA-min A, toielting CGA-min A, toilet transfers CGA using grab bars; continues to require verbal cues to utilize RUE >50% during functional tasks   supervision to min A, progressing towards meeting goals   Family present this week for part of education session-would benefit from additional education to support improved safety and careyover; have been working on [employer] fro self and coaching OT as if it were his caregiver during  sessions; improved functional use of RUE- utlized forced use through [employer]IM[employer] priciples; worked on BADLs with focus on standing balance and posterior reaching for clothing management; Pt now living in single level accessable apartment and will be able to utilize grab bars during shower/toielt trnasfers; TTB and BSC ordered through [facility]    Mobility   supervision bed mobility using bedrails, CGA squat pivot transfers using w/c armrests, CGA progressing to supervision sit<>stands using RW, gait training up to 167ft using RW with min assist of 1 for balance and management of R LE (advances R LE 50-100% during swing and stabilzes R LE during stance without assist), progressed to stair navigation training step-to pattern using L UE support on each HR with mod A of 1 and +2 guarding for safety, mod-I wheelchair mobility   supervision bed mobility, min assist transfers, mod assist gait, mod-I w/c mobility  continuing to have difficulty scheduling time for ex-wife to attend hands-on family education/training, pt was moved to 1st floor apartment no longer needing to do stair navigation for home entry, DME provided by [facility], pt has made significant progress with gait and is possibly able to perform ambulation with family's assistance if they demonstrate safe assistance technique therefore AFO consult scheduled for 6/5 with shoe toe-cap to be installed, continuing to address balance and gait training using RW    Communication   min to sup A   min A   abstract and complex communication, carryover of compensatory strategies    Safety/Cognition/ Behavioral Observations  Pain   Pt denies pain   Pt will continue to deny pain   Will assess qshift and PRN    Skin   Pt's skin is intact   Pt's skin will remain intact  Will assess qshift and PRN      Discharge Planning:  Family education today and tomorrow. Patient d/c home on Friday with spouse 24/7.   Team Discussion: Patient with poor  initiation, and limited use of right upper extremity post CVA.  HTN controlled and monitoring of CKD.  Patient on target to meet rehab goals: yes, currently needs supervision for upper body bathing and dressin gwith CGA for lower body care. Bathing at the shower level; completes tub transfers with CGA.  Completes sit - stand and squat pivots with CGA. Needs CGA for ambulation using a rolling walker up to 50' with a toe care/AFO in place.     *See Care Plan and progress notes for long and short-term goals.   Revisions to Treatment Plan:  Nephrology OP follow up   Focus on language; mild word finding deficit, verbal expression and following commands.  Teaching Needs: Safety, medication, dietary modification, hemi-dressing techniques, toileting and transfers, etc.  Current Barriers to Discharge: Decreased caregiver support  Possible Resolutions to Barriers: Family education OP follow up services DME: W/C, RW, TTB and BSC     Medical Summary Current Status: HTN with BPs creeping up , poor awareness of RUE  Barriers to Discharge: Uncontrolled Hypertension   Possible Resolutions to [employer] Focus: adjust BP meds prior to d/c, arrange for PCP f/u   Continued Need for Acute Rehabilitation Level of Care: The patient requires daily medical management by a physician with specialized training in physical medicine and rehabilitation for the following reasons: Direction of a multidisciplinary physical rehabilitation program to maximize functional independence : Yes Medical management of patient stability for increased activity during participation in an intensive rehabilitation regime.: Yes Analysis of laboratory values and/or radiology reports with any subsequent need for medication adjustment and/or medical intervention. : Yes   I attest that I was present, lead the team conference, and concur with the assessment and plan of the team.   Chana Bode B 08/30/2022, 2:35 PM

## 2022-08-30 NOTE — Progress Notes (Signed)
Inpatient Rehabilitation Care Coordinator Discharge Note   Patient Details  Name: Joseph Bright MRN: [id number] Date of Birth: 1975-12-29   Discharge location: Home with spouse  Length of Stay: 30 Days  Discharge activity level: Min A  Home/community participation: spouse  Patient response WU:[id number] Literacy - How often do you need to have someone help you when you read instructions, pamphlets, or other written material from your doctor or pharmacy?: Sometimes  Patient response YN:WGNFAO Isolation - How often do you feel lonely or isolated from those around you?: Never  Services provided included: SW, Neuropsych, Pharmacy, TR, CM, RN, SLP, OT, PT, RD, MD  Financial Services:  Financial Services Utilized: Private Insurance [employer]  Choices offered to/list presented to: patient and spouse  Follow-up services arranged:  Outpatient    Outpatient Servicies: Neuro Rehab PT OT SLP      Patient response to transportation need: Is the patient able to respond to transportation needs?: Yes In the past 12 months, has lack of transportation kept you from medical appointments or from getting medications?: No In the past 12 months, has lack of transportation kept you from meetings, work, or from getting things needed for daily living?: No   Patient/Family verbalized understanding of follow-up arrangements:  Yes  Individual responsible for coordination of the follow-up plan: [name] [phone]  Confirmed correct DME delivered: Andria Rhein 08/30/2022    Comments (or additional information):  Summary of Stay    Date/Time Discharge Planning CSW  08/29/22 1419 Family education today and tomorrow. Patient d/c home on Friday with spouse 24/7. CJB  08/22/22 9629 First family education completed. Pt discharging home with spouse who [facility] assisting 24/7. 15 steps to enter the home. Potential barrier with HH due to insurance. CJB  08/15/22 1349 Discharging home with   spouse hwo [facility].  Spouse attending 1st education session on 5/27 1-3 PM. Stalls for DME.  Barrier: Pt has 15 steps to enter the home & insurance for FU. CJB  08/08/22 1440 Discharging home with spouse who [facility].  Spouse anticpates providing 24/7. Barrier: Insurance for follow up and DME. Patient also has 15 steps to enter his home. CJB  08/08/22 1439 Discharging home with spouse who [facility].  Spouse anticpates providing 24/7. CJB       Andria Rhein

## 2022-08-30 NOTE — Plan of Care (Signed)
  Problem: RH Vision Goal: RH LTG Vision (Specify) Outcome: Completed/Met   Problem: RH Comprehension Communication Goal: LTG Patient will comprehend basic/complex auditory (SLP) Description: LTG: Patient will comprehend basic/complex auditory information with cues (SLP). Outcome: Completed/Met Flowsheets Taken 08/30/2022 1119 by Renaee Munda, CCC-SLP LTG: Patient will comprehend auditory information with cueing (SLP): Modified Independent Taken 08/03/2022 1135 by [name], CCC-SLP LTG: Patient will comprehend:  Basic auditory information  Complex auditory information   Problem: RH Expression Communication Goal: LTG Patient will express needs/wants via multi-modal(SLP) Description: LTG:  Patient will express needs/wants via multi-modal communication (gestures/written, etc) with cues (SLP) Outcome: Completed/Met Flowsheets (Taken 08/30/2022 1119) LTG: Patient will express needs/wants via multimodal communication (gestures/written, etc) with cueing (SLP):  Supervision  Minimal Assistance - Patient > 75% Goal: LTG Patient will verbally express basic/complex needs(SLP) Description: LTG:  Patient will verbally express basic/complex needs, wants or ideas with cues  (SLP) Outcome: Completed/Met Flowsheets (Taken 08/30/2022 1119) LTG: Patient will verbally express basic/complex needs, wants or ideas (SLP): Supervision Goal: LTG Patient will increase word finding of common (SLP) Description: LTG:  Patient will increase word finding of common objects/daily info/abstract thoughts with cues using compensatory strategies (SLP). Flowsheets Taken 08/30/2022 1119 by Renaee Munda, CCC-SLP LTG: Patient will increase word finding of common (SLP):  Supervision  Minimal Assistance - Patient > 75% Taken 08/03/2022 1135 by [name], CCC-SLP Patient will use compensatory strategies to increase word finding of:  Common objects  Daily info  Abstract thoughts

## 2022-08-31 NOTE — Progress Notes (Addendum)
PROGRESS NOTE   Subjective/Complaints:   Feels ready to go home, no new c/os  ROS: Patient denies CP, SOB, abdominal pain,  N/V/D/C.      Objective:   No results found. No results for input(s): "WBC", "HGB", "HCT", "PLT" in the last 72 hours.     No results for input(s): "NA", "K", "CL", "CO2", "GLUCOSE", "BUN", "CREATININE", "CALCIUM" in the last 72 hours.     Intake/Output Summary (Last 24 hours) at 08/31/2022 0814 Last data filed at 08/31/2022 0400 Gross per 24 hour  Intake 472 ml  Output 1000 ml  Net -528 ml         Physical Exam: Vital Signs Blood pressure (!) 133/91, pulse 68, temperature 98 F (36.7 C), resp. rate 18, height 5\' 9"  (1.753 m), weight 104.5 kg, SpO2 100 %.    General: No acute distress, laying in bed, appears comfortable  Mood and affect are appropriate Heart: RRR no murmur  Lungs: Clear to auscultation,no increased WOB Abdomen: Positive bowel sounds, soft nontender to palpation, nondistended Extremities: No clubbing, cyanosis, or edema Skin: No evidence of breakdown, no evidence of rash, calluses on knees   Neurologic:Alert and oriented x 3. Follows commands, Normal insight and awareness. Intact Memory. Normal language and speech. Cranial nerve exam unremarkable. MMT: Left-sided strength is normal RUE 4 prox to distal. RLE trace hip add , 0/5 HF, KE, KF and 0/5 distally. Sensory exam normal sensation to light touch and proprioception in bilateral upper and lower extremities  Musculoskeletal: Full range of motion in all 4 extremities. No joint swelling     Assessment/Plan: 1. 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 Stable for D/C today F/u PCP in 1-2weeks F/u nephro 1-2 wks F/u neuro 2-3 wk F/u PM&R 2-3 weeks See D/C summary See D/C instructions   Care Tool:  Bathing    Body parts bathed by patient: Face, Right arm, Left arm, Chest, Abdomen, Front perineal area,  Buttocks, Right upper leg, Left upper leg, Left lower leg, Right lower leg   Body parts bathed by helper: Right lower leg     Bathing assist Assist Level: Supervision/Verbal cueing     Upper Body Dressing/Undressing Upper body dressing   What is the patient wearing?: Pull over shirt    Upper body assist Assist Level: Set up assist    Lower Body Dressing/Undressing Lower body dressing      What is the patient wearing?: Pants, Incontinence brief     Lower body assist Assist for lower body dressing: Contact Guard/Touching assist     Toileting Toileting    Toileting assist Assist for toileting: Contact Guard/Touching assist     Transfers Chair/bed transfer  Transfers assist  Chair/bed transfer activity did not occur: N/A  Chair/bed transfer assist level: Contact Guard/Touching assist Chair/bed transfer assistive device: Bedrails, Armrests   Locomotion Ambulation   Ambulation assist      Assist level: Minimal Assistance - Patient > 75% Assistive device: Walker-rolling Max distance: 217ft   Walk 10 feet activity   Assist  Walk 10 feet activity did not occur: Safety/medical concerns  Assist level: Minimal Assistance - Patient > 75% Assistive device: Walker-rolling   Walk  50 feet activity   Assist Walk 50 feet with 2 turns activity did not occur: Safety/medical concerns  Assist level: Minimal Assistance - Patient > 75% Assistive device: Walker-rolling    Walk 150 feet activity   Assist Walk 150 feet activity did not occur: Safety/medical concerns  Assist level: Minimal Assistance - Patient > 75% Assistive device: Walker-rolling    Walk 10 feet on uneven surface  activity   Assist Walk 10 feet on uneven surfaces activity did not occur: Safety/medical concerns         Wheelchair     Assist Is the patient using a wheelchair?: Yes Type of Wheelchair: Manual    Wheelchair assist level: Independent Max wheelchair distance: >170ft     Wheelchair 50 feet with 2 turns activity    Assist        Assist Level: Independent   Wheelchair 150 feet activity     Assist      Assist Level: Independent   Blood pressure (!) 133/91, pulse 68, temperature 98 F (36.7 C), resp. rate 18, height 5\' 9"  (1.753 m), weight 104.5 kg, SpO2 100 %.  Medical Problem List and Plan: 1. 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  Antithrombotics: -DVT/anticoagulation:  amb >100' daily off heparin              -antiplatelet therapy: Aspirin 81 mg daily and Brilinta 90 mg twice daily 3. Pain Management: Tylenol as needed 4. Mood/Behavior/Sleep: Provide emotional support             -antipsychotic agents: N/A 5. Neuropsych/cognition: This patient is not capable of making decisions on his own behalf. 6. Skin/Wound Care: Routine skin checks 7. Fluids/Electrolytes/Nutrition: Routine in and outs with follow-up chemistries D/c CBG   8.  Seizure prophylaxis.  EEG negative.  Continue Keppra 500 mg daily 9.  AKI on CKD stage IV.  Follow-up chemistries.  Follow-up outpatient nephrology services             -pt given lokelma prior to rehab admit             Nephro signed off 5/24  -on Renvela and sodium bicarb -6/1-2/24 wt slightly uptrending but mostly stable, monitor routine labs  Filed Weights   08/29/22 1557 08/30/22 0500 08/31/22 0500  Weight: 108.1 kg 105.8 kg 104.5 kg        Latest Ref Rng & Units 08/28/2022    7:28 AM 08/21/2022    5:51 AM 08/18/2022    5:28 AM  BMP  Glucose 70 - 99 mg/dL 161  096  045   BUN 6 - 20 mg/dL 78  409  811   Creatinine 0.61 - 1.24 mg/dL 9.14  7.82  9.56   Sodium 135  - 145 mmol/L 140  139  137   Potassium 3.5 - 5.1 mmol/L 4.3  4.7  4.8   Chloride 98 - 111 mmol/L 110  106  102   CO2 22 - 32 mmol/L 22  20  22    Calcium 8.9 -  10.3 mg/dL 9.3  9.2  9.6     Improving  10.  Hyperlipidemia.  Crestor 20mg  QD 11.  Permissive hypertension.  Patient on amlodipine 10 mg daily, hydralazine 50 mg twice daily, labetalol 200 mg twice daily prior to admission.  Resume as needed  -6/5 diastolic BP trending up , HR is in 60s , start amlodipine 5mg  qd, (home dose is 10mg  but may cut tab)  Vitals:   08/27/22 1257 08/27/22 1926 08/28/22 0338 08/28/22 1343  BP: 131/84 138/84 (!) 130/91 (!) 131/97   08/28/22 2036 08/29/22 0402 08/29/22 1240 08/29/22 1940  BP: (!) 141/90 127/86 (!) 154/97 (!) 137/96   08/30/22 0606 08/30/22 1243 08/30/22 1924 08/31/22 0401  BP: (!) 133/92 (!) 156/98 (!) 141/92 (!) 133/91  May need further adjustment at PCP office   12.  Medical noncompliance.  Provide counseling 13. Constipation/incontinence of bowel: -5/27 increase miralax to BID, 5/29 small BM yesterday but none x several days  -08/26/22 no BM in 2 days but this is typical for him; continue Miralax BID, SenokotS 2 tabs BID, Sorbitol PRN for now, if no BM tomorrow may consider further increase in regimen.  -LBM 6/4 cont current tx  LOS: 30 days A FACE TO FACE EVALUATION WAS PERFORMED  Erick Colace 08/31/2022, 8:14 AM

## 2022-09-01 ENCOUNTER — Telehealth: Payer: Self-pay | Admitting: *Deleted

## 2022-09-01 ENCOUNTER — Encounter: Payer: Self-pay | Admitting: Physical Medicine & Rehabilitation

## 2022-09-01 NOTE — Telephone Encounter (Signed)
Transitional Care call [facility]    Are you/is patient experiencing any problems since coming home?NO             Are there any questions regarding any aspect of care? NO Are there any questions regarding medications administration/dosing? NO             Are meds being taken as prescribed?  Yes Patient should review meds with caller to confirm Have there been any falls? No Has Home Health been to the house and/or have they contacted you? Discharged home -self       If not, have you tried to contact them? N/a Can we help you contact them? N/a  Are bowels and bladder emptying properly? Yes       Are there any unexpected incontinence issues? No If applicable, is patient following bowel/bladder programs? N/a Any fevers, problems with breathing, unexpected pain? No Are there any skin problems or new areas of breakdown? No Has the patient/family member arranged specialty MD follow up (ie cardiology/neurology/renal/surgical/etc)?  Can we help arrange? Patient saw nephrology today. Pts ex wife will make follow-up appts contact information given [facility]. [facility] pulm Does the patient need any other services or support that we can help arrange? No Are caregivers following through as expected in assisting the patient? Yes ex-wife Has the patient quit smoking, drinking alcohol, or using drugs as recommended? Yes  Appointment time 1:15 arrive time and 1:00 who it is with here Dr. Wynn Banker [address]

## 2022-09-04 ENCOUNTER — Ambulatory Visit: Payer: Managed Care, Other (non HMO)

## 2022-09-04 DIAGNOSIS — I63522 Cerebral infarction due to unspecified occlusion or stenosis of left anterior cerebral artery: Secondary | ICD-10-CM

## 2022-09-04 LAB — CUP PACEART REMOTE DEVICE CHECK
Date Time Interrogation Session: 20240609230616
Implantable Pulse Generator Implant Date: 20240503

## 2022-09-07 ENCOUNTER — Encounter
Payer: Managed Care, Other (non HMO) | Attending: Physical Medicine & Rehabilitation | Admitting: Physical Medicine & Rehabilitation

## 2022-09-07 ENCOUNTER — Encounter: Payer: Self-pay | Admitting: Physical Medicine & Rehabilitation

## 2022-09-07 ENCOUNTER — Telehealth: Payer: Self-pay

## 2022-09-07 VITALS — BP 152/115 | HR 99 | Ht 69.0 in | Wt 225.0 lb

## 2022-09-07 DIAGNOSIS — I69398 Other sequelae of cerebral infarction: Secondary | ICD-10-CM | POA: Insufficient documentation

## 2022-09-07 DIAGNOSIS — I63522 Cerebral infarction due to unspecified occlusion or stenosis of left anterior cerebral artery: Secondary | ICD-10-CM | POA: Diagnosis present

## 2022-09-07 DIAGNOSIS — R269 Unspecified abnormalities of gait and mobility: Secondary | ICD-10-CM | POA: Insufficient documentation

## 2022-09-07 NOTE — Progress Notes (Signed)
Subjective:    Patient ID: Joseph Bright, male    DOB: 11-20-1975, 47 y.o.   MRN: [id number] admitted to rehab 08/01/2022 for inpatient therapies to consist of PT, ST and OT at least three hours five days a week. Past admission physiatrist, therapy team and rehab RN have worked together to provide customized collaborative inpatient rehab.  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  Patient remained on aspirin and Brilinta per neurology services.  Initially with subcutaneous heparin for DVT prophylaxis discontinued ambulating greater than 100 feet.  Seizure prophylaxis EEG negative Keppra as indicated no seizure activity.  AKI on CKD stage IV follow-up chemistries followed by nephrology services on Renvela as well as sodium bicarb and latest creatinine 4.23.  Permissive hypertension remained controlled patient had been on amlodipine hydralazine labetalol prior to admission resume as needed follow-up outpatient.  Patient had been receiving CPAP while in the hospital and would need outpatient polysomnogram versus sleep study to qualify and plan ambulatory referral to pulmonary services  47 y.o. right-handed male with history of hypertension, CKD stage IV followed outpatient nephrology as well as medical noncompliance, quit smoking 22 years ago.  Per chart review lives with spouse and young daughter.  1 level home multiple steps to entry independent prior to admission.  Presented 07/25/2022 with acute onset of right-sided weakness as well as speech difficulty.  Cranial CT scan negative for acute changes.  There was chronic appearing right thalamic lacunar infarct.  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  Status post TNK with interventional radiology for stenting.  MRI follow-up showed confluent left ACA territory infarction.  Cytotoxic edema with no hemorrhagic transformation.  Multiple additional scattered mostly  punctate ischemic foci in the left greater than right MCA, PCA and occasionally cerebellar artery territories.  No significant mass effect.  Echocardiogram with ejection fraction of 70 to 75% grade 1 diastolic dysfunction.  Patient with reported seizure-like activity.  EEG negative for seizure.  Maintained on Keppra for seizure prophylaxis.  TEE unremarkable no PFO no cardioembolic source.  Neurology follow-up maintained on aspirin as well as Brilinta 90 mg twice daily for CVA prophylaxis.  Underwent loop recorder placement 07/28/2022.   Admit date: 08/01/2022 Discharge date: 08/31/2022 HPI 47 year old male who is approximately 6 weeks post left ACA infarct.  He initially had right hemiplegia however he regained strength in his right upper extremity although he is tends to use his left side primarily.  He has been doing some very limited ambulation at home.  He is living with his wife.  They have moved to a first-floor apartment.  Patient was employed in the DME business.  His employer has assisted with DME.  They also will be transporting him to outpatient therapy. The patient has no falls at home.  His wife is assisting him with ambulation into the bathroom.  She works from home. Pain Inventory Average Pain 0 Pain Right Now 0 My pain is  none  LOCATION OF PAIN  none  BOWEL Number of stools per week: 4 Oral laxative use No  Type of laxative none Enema or suppository use No  History of colostomy No  Incontinent No   BLADDER Normal In and out cath, frequency n/a Able to self cath  n/a Bladder incontinence No  Frequent urination Yes  Leakage with coughing No  Difficulty starting stream No  Incomplete bladder emptying No    Mobility  walk with assistance ability to climb steps?  no do you drive?  no use a wheelchair needs help with transfers Do you have any goals in this area?  yes  Function disabled: date disabled    Neuro/Psych weakness tremor trouble  walking confusion  Prior Studies    Physicians involved in your care Transitional care   History reviewed. No pertinent family history. Social History   Socioeconomic History   Marital status: Married    Spouse name: Not on file   Number of children: Not on file   Years of education: Not on file   Highest education level: Not on file  Occupational History   Not on file  Tobacco Use   Smoking status: Former    Types: Cigarettes    Start date: 04/17/1995    Quit date: 04/16/2000    Years since quitting: 22.4   Smokeless tobacco: Never  Substance and Sexual Activity   Alcohol use: Yes    Comment: weekends/special events   Drug use: Never   Sexual activity: Not on file  Other Topics Concern   Not on file  Social History Narrative   Not on file   Social Determinants of Health   Financial Resource Strain: Not on file  Food Insecurity: Not on file  Transportation Needs: Not on file  Physical Activity: Not on file  Stress: Not on file  Social Connections: Not on file   Past Surgical History:  Procedure Laterality Date   IR CT HEAD LTD  08/07/2022   IR INTRA CRAN STENT  08/07/2022   IR PERCUTANEOUS ART THROMBECTOMY/INFUSION INTRACRANIAL INC DIAG ANGIO  07/25/2022   LOOP RECORDER INSERTION N/A 07/28/2022   Procedure: LOOP RECORDER INSERTION;  Surgeon: Regan Lemming, MD;  Location: [facility] INVASIVE CV LAB;  Service: Cardiovascular;  Laterality: N/A;   RADIOLOGY WITH ANESTHESIA N/A 07/25/2022   Procedure: RADIOLOGY WITH ANESTHESIA;  Surgeon: Radiologist, Medication, MD;  Location: [facility] OR;  Service: Radiology;  Laterality: N/A;   TEE WITHOUT CARDIOVERSION N/A 07/28/2022   Procedure: TRANSESOPHAGEAL ECHOCARDIOGRAM;  Surgeon: Parke Poisson, MD;  Location: [facility] INVASIVE CV LAB;  Service: Cardiovascular;  Laterality: N/A;   Past Medical History:  Diagnosis Date   Hypertension    Stroke (HCC)    BP (!) 152/115   Pulse 99   Ht 5\' 9"  (1.753 m)   Wt 225 lb (102.1 kg)   SpO2  96%   BMI 33.23 kg/m   Opioid Risk Score:   Fall Risk Score:  `1  Depression screen [facility] 2/9     09/07/2022    1:14 PM  Depression screen PHQ 2/9  Decreased Interest 0  Down, Depressed, Hopeless 0  PHQ - 2 Score 0  Altered sleeping 1  Tired, decreased energy 0  Change in appetite 1  Feeling bad or failure about yourself  0  Trouble concentrating 0  Moving slowly or fidgety/restless 1  Suicidal thoughts 0  PHQ-9 Score 3      Review of Systems  All other systems reviewed and are negative.      Objective:   Physical Exam  General no acute distress Mood and affect are appropriate Extremities without edema Strength is 4+ in the right deltoid bicep tricep grip 5/5 in the left deltoid bicep tricep grip 5/5 left hip flexor knee extensor ankle dorsal EXTR Right lower extremity 0/5 in the foot and ankle 0 at the hip flexor trace hip knee extensor synergy. Tone no evidence of spasticity in the  right upper limb or right lower limb Speech without evidence of dysarthria or aphasia he does have reduced verbal output poor initiation. Sit to stand with supervision Takes a few steps but needs some assistance advancing his right lower extremity.  He does wear a right AFO as well as a leather toe.      Assessment & Plan:  #1.  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  I went over the expected recovery process discussed that cognitive recovery tends to lag behind physical we expect a plateau in approximately 6 months from physical standpoint but 12 months from a cognitive standpoint. We discussed that at this point I do not recommend he do any driving.  In addition no return to work at this time.  Will monitor his progress.  He will be starting outpatient therapy next week.  I will see him back in 3 months. We discussed the importance of follow-up with primary care for risk factor management as well as with  neurology for secondary stroke prevention. Blood pressure elevated today will send message to his primary doctor have also instructed wife to make appointment with primary doctor regarding blood pressure management. Patient was discharged with prescriptions for Normodyne 200 mg twice a day, hydralazine 50 mg twice a day Had been on Imdur 60mg  qd prior to CVa. Called primary care office to notify them of elevated blood pressure awaiting callback. He is on aspirin and Brilinta for stroke prophylaxis status post stent placement Physical medicine and rehabilitation follow-up in 3 months.  He is unable to work he is unable to drive, guarded prognosis for each of these.

## 2022-09-07 NOTE — Telephone Encounter (Signed)
Delice Bison with [facility] returned your call. You can call the office back tomorrow on phone number [phone] and ask for Dr. Johny Blamer or his Assistant Thurston Pounds.

## 2022-09-07 NOTE — Patient Instructions (Signed)
Preventing Cerebrovascular Disease Cerebrovascular disease affects the arteries that supply the brain. Any condition that blocks or disrupts blood flow to the brain can cause cerebrovascular disease. When brain cells lose blood supply, they start to die within minutes, resulting in a stroke. Stroke is the main danger of cerebrovascular disease. Many conditions can cause cerebrovascular disease and stroke. You can make lifestyle changes to prevent these conditions and to lower your risk of cerebrovascular disease. By making these changes, you can also improve your overall health and quality of life. What can increase my risk of developing cerebrovascular disease? Certain factors make you more likely to develop cerebrovascular disease. Some of these factors are things that you cannot control, including: Being over 89 years of age. Being male. Strokes happen more often in women. Family history. Having a parent or sibling who had a stroke before age 45 increases your risk of a stroke. Race. African Americans have a higher risk of death from stroke. Having certain health conditions, such as: Diabetes. High blood pressure. Heart disease. Atherosclerosis. High cholesterol. Sickle cell disease. Atrial fibrillation (abnormal heart rhythm). Sleep apnea. Having a history of transient ischemic attack or stroke. Other risk factors are things that you can control, including: Being overweight. Using tobacco products. Not being physically active. Eating a high-fat diet. Alcohol or drug use. Talk with your health care provider about your risk for cerebrovascular disease. Work with your provider to manage any diseases that you have that may contribute to cerebrovascular disease. Your provider may prescribe medicines to help prevent major causes of cerebrovascular disease. What actions can I take to prevent cerebrovascular disease? Nutrition  Eat more fruits, vegetables, and whole grains. Reduce how  much saturated fat you eat. Eat less red meat and fewer full-fat dairy products. Eat healthy proteins which include: Fish. Eat fish containing heart-healthy omega-3 fatty acids such as salmon, albacore tuna, mackerel and herring. Chicken or [place]. Nuts and seeds. Low-fat or nonfat yogurt. Avoid precooked or cured meats, such as bacon, sausages, and meat loaves. Avoid foods that contain: A lot of sugar, such as sweets and drinks with added sugar. A lot of salt (sodium). Avoid adding extra salt to your food, as told by your provider. Trans fats (trans-fatty acids), such as margarine and fats in baked goods. Trans fats may be listed as "partially hydrogenated oils" on food labels. Check food labels to see how much sodium, sugar, and trans fats are in foods. Use vegetable oils that contain low amounts of saturated fat, such as olive or canola oil. Lifestyle Exercise for 30?60 minutes on most days, or as much as told by your provider. Do moderate-intensity exercise, such as brisk walking, bicycling, and water aerobics. Ask your provider which activities are safe for you. Do not use any products that contain nicotine or tobacco, such as cigarettes, e-cigarettes, and chewing tobacco. If you need help quitting, ask your provider. Do not drink alcohol if: Your provider tells you not to drink. You are pregnant, may be pregnant, or plan to become pregnant. If you drink alcohol: Limit how much you use to: 0-1 drink a day if you are male. 0-2 drinks a day if you are male. Be aware of how much alcohol is in your drink. In the [place] one drink is one 12 oz bottle of beer (355 mL), one 5 oz glass of wine (148 mL), or one 1 oz glass of hard liquor (44 mL). Other things to do If told, check your blood pressure regularly and report  the readings to your provider. If you are overweight, ask your provider to recommend a weight-loss plan for you. Losing 5-10 lb (2.2-4.5 kg) can reduce your risk of diabetes,  atherosclerosis, and high blood pressure. Where to find more information Learn more about preventing cerebrovascular disease from: National Heart, Lung, and Blood Institute: BuffaloDryCleaner.gl Centers for Disease Control and Prevention: TonerPromos.no American Stroke Association: stroke.org Contact a health care provider if: You develop any of the following symptoms: Headaches that keep coming back. Nausea. Vision problems. Increased sensitivity to noise or light. Depression, anxiety, irritability, or mood swings. Difficulty concentrating or paying attention or problems with memory. Sleep problems or feeling tired all the time. Get help right away if: You are light-headed or you faint. You are taking blood thinners and you fall or you get a minor injury to the head. You have a bleeding disorder and you fall or you get a minor injury to the head. You have any symptoms of a stroke. "BE FAST" is an easy way to remember the main warning signs of a stroke: B - Balance.Signs are dizziness, sudden trouble walking, or loss of balance. E - Eyes.Signs are trouble seeing or a sudden change in vision. F - Face. Signs are sudden weakness or numbness of the face, or the face or eyelid drooping on one side. A - Arms. Signs are weakness or numbness in an arm. This happens suddenly and usually on one side of the body. S - Speech. Signs are sudden trouble speaking, slurred speech, or trouble understanding what people say. T - Time.Time to call emergency services. Write down what time symptoms started. You have other signs of a stroke, such as: A sudden, severe headache with no known cause. Nausea or vomiting. Seizure. These symptoms may be an emergency. Get help right away. Call 911. Do not wait to see if the symptoms will go away. Do not drive yourself to the hospital. This information is not intended to replace advice given to you by your health care provider. Make sure you discuss any questions you have with  your health care provider. Document Revised: 11/04/2021 Document Reviewed: 11/04/2021 Elsevier Patient Education  2024 [employer].

## 2022-09-11 ENCOUNTER — Ambulatory Visit: Payer: Managed Care, Other (non HMO)

## 2022-09-11 ENCOUNTER — Ambulatory Visit: Payer: Managed Care, Other (non HMO) | Admitting: Occupational Therapy

## 2022-09-11 ENCOUNTER — Ambulatory Visit: Payer: Managed Care, Other (non HMO) | Admitting: Physical Therapy

## 2022-09-11 NOTE — Therapy (Deleted)
OUTPATIENT SPEECH LANGUAGE PATHOLOGY EVALUATION   Patient Name: Joseph Bright MRN: [id number] DOB:1975/09/09, 47 y.o., male Today's Date: 09/11/2022  PCP: Noberto Retort MD REFERRING PROVIDER: Charlton Amor, PA-C  END OF SESSION:   Past Medical History:  Diagnosis Date   Hypertension    Stroke [facility])    Past Surgical History:  Procedure Laterality Date   IR CT HEAD LTD  08/07/2022   IR INTRA CRAN STENT  08/07/2022   IR PERCUTANEOUS ART THROMBECTOMY/INFUSION INTRACRANIAL INC DIAG ANGIO  07/25/2022   LOOP RECORDER INSERTION N/A 07/28/2022   Procedure: LOOP RECORDER INSERTION;  Surgeon: Regan Lemming, MD;  Location: [facility] INVASIVE CV LAB;  Service: Cardiovascular;  Laterality: N/A;   RADIOLOGY WITH ANESTHESIA N/A 07/25/2022   Procedure: RADIOLOGY WITH ANESTHESIA;  Surgeon: Radiologist, Medication, MD;  Location: [facility] OR;  Service: Radiology;  Laterality: N/A;   TEE WITHOUT CARDIOVERSION N/A 07/28/2022   Procedure: TRANSESOPHAGEAL ECHOCARDIOGRAM;  Surgeon: Parke Poisson, MD;  Location: [facility] INVASIVE CV LAB;  Service: Cardiovascular;  Laterality: N/A;   Patient Active Problem List   Diagnosis Date Noted   Adjustment disorder with mixed anxiety and depressed mood 08/07/2022   Cerebrovascular accident (CVA) due to occlusion of left posterior communicating artery (HCC) 08/01/2022   Acute ischemic left ACA stroke (HCC) 07/25/2022   Arterial ischemic stroke, ACA (anterior cerebral artery), left, acute (HCC) 07/25/2022   DYSLIPIDEMIA 04/24/2007   RENAL FAILURE NOS 11/29/2006   HYPERTENSION, ESSENTIAL NOS 11/12/2006    ONSET DATE: 07/25/22 (referral=08/24/22)  REFERRING DIAG: I61.9 (ICD-10-CM) - CVA (cerebrovascular accident due to intracerebral hemorrhage)  THERAPY DIAG:  No diagnosis found.  Rationale for Evaluation and Treatment: {HABREHAB:27488}  SUBJECTIVE:   SUBJECTIVE STATEMENT: *** Pt accompanied by: {accompnied:27141}  PERTINENT HISTORY: "Joseph Bright is a  47 year old right-handed male with history of hypertension, CKD stage IV followed at outpatient nephrology, as well as medical noncompliance, quit smoking 22 years ago. Per chart review patient lives with spouse-year-old daughter. 1 level home multiple steps to entry. Independent prior to admission. Presented 07/25/2022 with acute onset of right-sided weakness as well as speech difficulty. Cranial CT scan negative for acute changes. There was chronic appearing right thalamic lacunar infarct. 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 Status post TNK with interventional radiology for stenting. MRI follow-up showed confluent left ACA territory infarction. Cytotoxic edema with no hemorrhagic transformation. Multiple additional scattered mostly punctate ischemic foci in the left greater than right MCA, PCA and occasionally cerebellar artery territories. No significant mass effect. Echocardiogram with ejection fraction of 70 to 75% grade 1 diastolic dysfunction. Patient with reported seizure-like activity. EEG negative for seizure. Patient was maintained on Keppra for seizure prophylaxis."  PAIN: Are you having pain? {OPRCPAIN:27236}  FALLS: Has patient fallen in last 6 months?  {WUJWJXBJ:47829}  LIVING ENVIRONMENT: Lives with: {OPRC lives with:25569::"lives with their family"} Lives in: {Lives in:25570}  PLOF:  Level of assistance: {FAOZHYQ:65784} Employment: {SLPemployment:25674}  PATIENT GOALS: ***  OBJECTIVE:   DIAGNOSTIC FINDINGS: ***  COGNITION: Overall cognitive status: {cognition:24006} Areas of impairment:  {cognitiveimpairmentslp:27409} Functional deficits: ***  AUDITORY COMPREHENSION: Overall auditory comprehension: {IMPAIRED:25374} YES/NO questions: {IMPAIRED:25374} Following directions: {IMPAIRED:25374} Conversation: {SLP conversation:25430} Interfering components: {SLP interfering components:25431} Effective technique: {SLP effective  technique:25432}  READING COMPREHENSION: {SLPreadingcomprehension:27140}  EXPRESSION: {SLP EXPRESSION:25433}  VERBAL EXPRESSION: Level of generative/spontaneous verbalization: {SLP level of generative/spontaneious verbalization:25435} Automatic speech: {SLP ATOMIC SPEECH:25434}  Repetition: {SLPrepetion:27212} Naming: {SLPnaming:27214} Pragmatics: {slppragmatics:27216} Comments: *** Interfering components: {SLP INTERFERING COMPONENTS:25436}  Effective technique: {SLP EFFECTIVE TECHNIQUE:25437} Non-verbal means of communication: {SLP non verbal means of communication:25438}  WRITTEN EXPRESSION: Dominant hand: {RIGHT/LEFT:20294} Written expression: {slpwrittenexp:27209}  MOTOR SPEECH: Overall motor speech: {slpimpaired:27210} Level of impairment: {SLP level of impairment:25441} Respiration: {respbreathing:27195} Phonation: {SLP phonation:25439} Resonance: {SLP resonance:25440} Articulation: {SLParticulation:27218} Intelligibility: {SLP Intelligible:25442} Motor planning: {slpmotorspeecherrors:27220} Motor speech errors: {SLP motor speech errors:25443} Interfering components: {SLP Interfering components (MS):25444} Effective technique: {SLP effective technique (MS):25445}  ORAL MOTOR EXAMINATION: Overall status: {OMESLP2:27645} Comments: ***  RECOMMENDATIONS FROM OBJECTIVE SWALLOW STUDY (MBSS/FEES):  *** Objective swallow impairments: *** Objective recommended compensations: ***  CLINICAL SWALLOW ASSESSMENT:   Current diet: {slpdiet:27196} Dentition: {dentition:27197} Patient directly observed with POs: {POobserved:27199} Feeding: {slp feeding:27200} Liquids provided by: {SLPliquids:27201} Oral phase signs and symptoms: {SLPoralphase:27202} Pharyngeal phase signs and symptoms: {SLPpharyngealphase:27203} Comments: ***  STANDARDIZED ASSESSMENTS: {SLPstandardizedassessment:27092}  PATIENT REPORTED OUTCOME MEASURES (PROM): {SLPPROM:27095}   TODAY'S TREATMENT:                                                                                                                                          DATE: ***   PATIENT EDUCATION: Education details: *** Person educated: {Person educated:25204} Education method: {Education Method:25205} Education comprehension: {Education Comprehension:25206}   GOALS: Goals reviewed with patient? {yes/no:20286}  SHORT TERM GOALS: Target date: ***  *** Baseline: Goal status: {GOALSTATUS:25110}  2.  *** Baseline:  Goal status: {GOALSTATUS:25110}  3.  *** Baseline:  Goal status: {GOALSTATUS:25110}  4.  *** Baseline:  Goal status: {GOALSTATUS:25110}  5.  *** Baseline:  Goal status: {GOALSTATUS:25110}  6.  *** Baseline:  Goal status: {GOALSTATUS:25110}  LONG TERM GOALS: Target date: ***  *** Baseline:  Goal status: {GOALSTATUS:25110}  2.  *** Baseline:  Goal status: {GOALSTATUS:25110}  3.  *** Baseline:  Goal status: {GOALSTATUS:25110}  4.  *** Baseline:  Goal status: {GOALSTATUS:25110}  5.  *** Baseline:  Goal status: {GOALSTATUS:25110}  6.  *** Baseline:  Goal status: {GOALSTATUS:25110}  ASSESSMENT:  CLINICAL IMPRESSION: Patient is a 47 y.o. M who was seen today for ***.   OBJECTIVE IMPAIRMENTS: include {SLPOBJIMP:27107}. These impairments are limiting patient from {SLPLIMIT:27108}. Factors affecting potential to achieve goals and functional outcome are {SLP factors:25450}. Patient will benefit from skilled SLP services to address above impairments and improve overall function.  REHAB POTENTIAL: {rehabpotential:25112}  PLAN:  SLP FREQUENCY: {rehab frequency:25116}  SLP DURATION: {rehab duration:25117}  PLANNED INTERVENTIONS: {SLP treatment/interventions:25449}    Gracy Racer, CCC-SLP 09/11/2022, 7:50 AM

## 2022-09-12 ENCOUNTER — Ambulatory Visit: Payer: Managed Care, Other (non HMO) | Attending: Physician Assistant | Admitting: Occupational Therapy

## 2022-09-12 ENCOUNTER — Encounter: Payer: Self-pay | Admitting: Occupational Therapy

## 2022-09-12 DIAGNOSIS — M6281 Muscle weakness (generalized): Secondary | ICD-10-CM | POA: Insufficient documentation

## 2022-09-12 DIAGNOSIS — R278 Other lack of coordination: Secondary | ICD-10-CM | POA: Diagnosis present

## 2022-09-12 DIAGNOSIS — R208 Other disturbances of skin sensation: Secondary | ICD-10-CM | POA: Insufficient documentation

## 2022-09-12 DIAGNOSIS — R29818 Other symptoms and signs involving the nervous system: Secondary | ICD-10-CM | POA: Insufficient documentation

## 2022-09-12 NOTE — Telephone Encounter (Signed)
Task completed

## 2022-09-12 NOTE — Patient Instructions (Addendum)
  Putty Exercises All exercises to be completed TWICE daily Comments **Putty has a tendency to spread out to fit its environment. Please keep putty contained when not in use, out of the heat, out of reach of children and animals, and away from clothing/fabric as it may stick.** SEARCH Locate and pinch each individual item out of the putty. These can be COINS, BUTTONS, MARBLES, ETC. By holding putty in opposite hand, use affected hand to search and remove items. You can also do this with putty on table (as pictured). Repeat until all items have been removed from the putty.   USE:  3 items for 1 hand  OR 1-2 items for each hand    GRIP Hold the putty in the palm of your hand. Now grip the putty bending your fingers into the putty as far as you can until they quit moving. REPEAT:  10 times HOLD: until fingers quit moving or about 5 seconds    PINCH Roll up some putty to create a small tubular section. Next, pinch the putty and repeat down the section.   i.e. thumb to index finger, thumb to middle finger, thumb to ring finger, and thumb to pinky finger.        REPEAT:  5 times each finger HOLD:  at least 3 seconds   

## 2022-09-12 NOTE — Telephone Encounter (Signed)
Message left with Lesly Rubenstein with [facility] for Dr. Tiburcio Pea clinical person: BP was  152/115, pulse 99 on 09/07/2022. Patient has a history of stroke.

## 2022-09-12 NOTE — Therapy (Signed)
OUTPATIENT OCCUPATIONAL THERAPY NEURO EVALUATION  Patient Name: Joseph Bright MRN: [id number] DOB:[birth year], 47 y.o., male Today's Date: 09/12/2022  PCP: Noberto Retort, MD  REFERRING PROVIDER: Charlton Amor, PA-C   END OF SESSION:  OT End of Session - 09/12/22 1103     Visit Number 1    Number of Visits 17    Date for OT Re-Evaluation 11/20/22   Date adjusted as pt was not wanting to schedue visits the week of 09/18/2022   Authorization Type [employer] (60 VL - OT, PT, ST)    OT Start Time 1101    OT Stop Time 1145    OT Time Calculation (min) 44 min    Activity Tolerance Patient tolerated treatment well    Behavior During Therapy [facility] for tasks assessed/performed             Past Medical History:  Diagnosis Date   Hypertension    Stroke [facility])    Past Surgical History:  Procedure Laterality Date   IR CT HEAD LTD  08/07/2022   IR INTRA CRAN STENT  08/07/2022   IR PERCUTANEOUS ART THROMBECTOMY/INFUSION INTRACRANIAL INC DIAG ANGIO  07/25/2022   LOOP RECORDER INSERTION N/A 07/28/2022   Procedure: LOOP RECORDER INSERTION;  Surgeon: Regan Lemming, MD;  Location: [facility] INVASIVE CV LAB;  Service: Cardiovascular;  Laterality: N/A;   RADIOLOGY WITH ANESTHESIA N/A 07/25/2022   Procedure: RADIOLOGY WITH ANESTHESIA;  Surgeon: Radiologist, Medication, MD;  Location: [facility] OR;  Service: Radiology;  Laterality: N/A;   TEE WITHOUT CARDIOVERSION N/A 07/28/2022   Procedure: TRANSESOPHAGEAL ECHOCARDIOGRAM;  Surgeon: Parke Poisson, MD;  Location: [facility] INVASIVE CV LAB;  Service: Cardiovascular;  Laterality: N/A;   Patient Active Problem List   Diagnosis Date Noted   Adjustment disorder with mixed anxiety and depressed mood 08/07/2022   Cerebrovascular accident (CVA) due to occlusion of left posterior communicating artery (HCC) 08/01/2022   Acute ischemic left ACA stroke (HCC) 07/25/2022   Arterial ischemic stroke, ACA (anterior cerebral artery), left, acute (HCC) 07/25/2022    DYSLIPIDEMIA 04/24/2007   RENAL FAILURE NOS 11/29/2006   HYPERTENSION, ESSENTIAL NOS 11/12/2006    ONSET DATE: 07/25/2022  REFERRING DIAG: I61.9 (ICD-10-CM) - CVA (cerebrovascular accident due to intracerebral hemorrhage)   THERAPY DIAG:  Other lack of coordination  Muscle weakness (generalized)  Other disturbances of skin sensation  Other symptoms and signs involving the nervous system  Rationale for Evaluation and Treatment: Rehabilitation  SUBJECTIVE:   SUBJECTIVE STATEMENT: Pt is very motivated to get back to PLOF. 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 This required both heavy lifting and computer work.   Pt accompanied by: significant other - [name]  PERTINENT HISTORY: Pt presented to ED 07/25/2022. "Cranial CT scan negative for acute changes. There was chronic appearing right thalamic lacunar infarct. 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 Status post TNK with interventional radiology for stenting. MRI follow-up showed confluent left ACA territory infarction. Cytotoxic edema with no hemorrhagic transformation. Multiple additional scattered mostly punctate ischemic foci in the left greater than right MCA, PCA and occasionally cerebellar artery territories."  Pt in IRF from 08/01/2022 -08/31/2022 then discharged to OP OT, PT, and ST.   PRECAUTIONS: Fall  WEIGHT BEARING RESTRICTIONS: No  PAIN:  Are you having pain? No  FALLS: Has patient fallen in last 6 months? No  LIVING ENVIRONMENT: Lives with: lives with their family Lives in: House/apartment Stairs: No Has  following equipment at home: Dan Humphreys - 2 wheeled, Environmental consultant - 4 wheeled, Wheelchair (manual), bed side commode, and Grab bars  PLOF: Independent; full-time working for [employer]; driving; sports (football and basketball)  PATIENT GOALS: return to [employer]  OBJECTIVE:   HAND DOMINANCE: Right  ADLs: Overall ADLs: min  A  IADLs: Shopping: dependent Light housekeeping: dependent Community mobility: dependent  Handwriting: Not legible  MOBILITY STATUS: Needs Assist: manual wheelchair  ACTIVITY TOLERANCE: Activity tolerance: fair  FUNCTIONAL OUTCOME MEASURES: FOTO: Slight: 59   UPPER EXTREMITY ROM:     AROM Right (eval) Left (eval)  Shoulder flexion WNL WNL  Shoulder abduction WNL WNL  Elbow flexion WNL WNL  Elbow extension WNL WNL  Wrist flexion WNL WNL  Wrist extension WNL WNL  Wrist pronation WNL WNL  Wrist supination WNL WNL   Digit Composite Flexion WNL WNL  Digit Composite Extension WNL WNL  Digit Opposition WNL WNL  (Blank rows = not tested)  RUE: IR to hip; overall bradykinesia with tremor  UPPER EXTREMITY MMT:     MMT Right (eval) Left (eval)  Shoulder flexion 4/5 WNL  Shoulder abduction 4/5 WNL  Elbow flexion 4/5 WNL  Elbow extension 4/5 WNL  (Blank rows = not tested)  HAND FUNCTION: Grip strength: Right: 38.8 lbs; Left: 82.8 lbs  COORDINATION: 9 Hole Peg test: Right: 139 sec; Left: 32 sec  SENSATION: Paresthesias reported in R fingertips  EDEMA: none reported or observed  MUSCLE TONE: WFL  COGNITION: Overall cognitive status: Within functional limits for tasks assessed  VISION: Subjective report: no changes Baseline vision: Wears glasses for reading only Visual history:  none  VISION ASSESSMENT: WFL  PERCEPTION: WFL  PRAXIS: WFL  OBSERVATIONS: Pt appears well-kept. He is able to propel w/c with increased time. Has RW with him.   TODAY'S TREATMENT:                                                                                                                               OT initiated RUE coordination and green putty HEPs as noted in pt instructions for improved coordination and strength.   PATIENT EDUCATION: Education details: OT role and POC Person educated: Patient and Spouse Education method: Explanation Education comprehension:  verbalized understanding  HOME EXERCISE PROGRAM: 09/12/2022: Chilton Si putty and coordination HEPs  GOALS:  SHORT TERM GOALS: Target date: 10/10/2022    Patient will demonstrate independence with initial RUE HEP. Baseline: Goal status: INITIAL  2.  Patient will demonstrate at least 45 lbs R grip strength as needed to open jars and other containers. Baseline: 38.8 lbs Goal status: INITIAL  3.  Pt will report no difficulty holding a dish while washing it.  Baseline: with some difficulty Goal status: INITIAL   LONG TERM GOALS: Target date: 11/17/2022    Patient will demonstrate updated RUE HEP with 25% verbal cues or less for proper execution. Baseline:  Goal status: INITIAL  2.  Patient will demonstrate  at least 50 lbs R grip strength as needed to open jars and other containers. Baseline: 38.8 lbs Goal status: INITIAL  3.  Patient will complete nine-hole peg with use of R in 60 seconds or less. Baseline: 139 seconds Goal status: INITIAL  4.  Pt will complete d/c FOTO.  Baseline:  Goal status: INITIAL   ASSESSMENT:  CLINICAL IMPRESSION: Patient is a 47 y.o. male who was seen today for occupational therapy evaluation for CVA. Hx includes HTN, adjustment disorder with anxiety and depression, and renal failure. Patient currently presents below baseline level of functioning demonstrating functional deficits and impairments as noted below. Pt would benefit from skilled OT services in the outpatient setting to work on impairments as noted below to help pt return to PLOF as able.    PERFORMANCE DEFICITS: in functional skills including ADLs, IADLs, coordination, dexterity, strength, Fine motor control, Gross motor control, mobility, endurance, and UE functional use  IMPAIRMENTS: are limiting patient from ADLs, IADLs, work, and leisure.   CO-MORBIDITIES: may have co-morbidities  that affects occupational performance. Patient will benefit from skilled OT to address above impairments  and improve overall function.  MODIFICATION OR ASSISTANCE TO COMPLETE EVALUATION: No modification of tasks or assist necessary to complete an evaluation.  OT OCCUPATIONAL PROFILE AND HISTORY: Problem focused assessment: Including review of records relating to presenting problem.  CLINICAL DECISION MAKING: LOW - limited treatment options, no task modification necessary  REHAB POTENTIAL: Good  EVALUATION COMPLEXITY: Low    PLAN:  OT FREQUENCY: 2x/week  OT DURATION: 8 weeks  PLANNED INTERVENTIONS: self care/ADL training, therapeutic exercise, therapeutic activity, neuromuscular re-education, manual therapy, functional mobility training, splinting, ultrasound, fluidotherapy, patient/family education, DME and/or AE instructions, and Re-evaluation  RECOMMENDED OTHER SERVICES: Receiving PT and ST  CONSULTED AND AGREED WITH PLAN OF CARE: Patient and family member/caregiver  PLAN FOR NEXT SESSION: Review RUE HEP    Delana Meyer, OT 09/12/2022, 1:27 PM

## 2022-09-20 ENCOUNTER — Ambulatory Visit: Payer: Managed Care, Other (non HMO) | Admitting: Cardiology

## 2022-09-25 ENCOUNTER — Encounter: Payer: Self-pay | Admitting: Speech Pathology

## 2022-09-25 ENCOUNTER — Ambulatory Visit: Payer: Managed Care, Other (non HMO) | Attending: Physician Assistant | Admitting: Physical Therapy

## 2022-09-25 ENCOUNTER — Ambulatory Visit: Payer: Managed Care, Other (non HMO) | Admitting: Speech Pathology

## 2022-09-25 DIAGNOSIS — R29818 Other symptoms and signs involving the nervous system: Secondary | ICD-10-CM | POA: Insufficient documentation

## 2022-09-25 DIAGNOSIS — I63522 Cerebral infarction due to unspecified occlusion or stenosis of left anterior cerebral artery: Secondary | ICD-10-CM | POA: Insufficient documentation

## 2022-09-25 DIAGNOSIS — M6281 Muscle weakness (generalized): Secondary | ICD-10-CM | POA: Insufficient documentation

## 2022-09-25 DIAGNOSIS — R4701 Aphasia: Secondary | ICD-10-CM

## 2022-09-25 DIAGNOSIS — R2681 Unsteadiness on feet: Secondary | ICD-10-CM | POA: Insufficient documentation

## 2022-09-25 DIAGNOSIS — R208 Other disturbances of skin sensation: Secondary | ICD-10-CM | POA: Insufficient documentation

## 2022-09-25 DIAGNOSIS — R278 Other lack of coordination: Secondary | ICD-10-CM | POA: Diagnosis present

## 2022-09-25 DIAGNOSIS — R2689 Other abnormalities of gait and mobility: Secondary | ICD-10-CM | POA: Insufficient documentation

## 2022-09-25 NOTE — Therapy (Signed)
OUTPATIENT SPEECH LANGUAGE PATHOLOGY APHASIA EVALUATION   Patient Name: Joseph Bright MRN: [id number] DOB:04-13-1975, 47 y.o., male Today's Date: 09/25/2022  PCP: Noberto Retort, MD REFERRING PROVIDER: Charlton Amor, PA-C  END OF SESSION:  End of Session - 09/25/22 1152     Visit Number 1    Number of Visits 25    Date for SLP Re-Evaluation 12/18/22    Authorization Type cigna medicaid - 60 combined - each discipline counts as 1 visit - I''ll request 12 visits as physical is his primary concern    SLP Start Time 1100    SLP Stop Time  1145    SLP Time Calculation (min) 45 min    Activity Tolerance Patient tolerated treatment well             Past Medical History:  Diagnosis Date   Hypertension    Stroke [facility])    Past Surgical History:  Procedure Laterality Date   IR CT HEAD LTD  08/07/2022   IR INTRA CRAN STENT  08/07/2022   IR PERCUTANEOUS ART THROMBECTOMY/INFUSION INTRACRANIAL INC DIAG ANGIO  07/25/2022   LOOP RECORDER INSERTION N/A 07/28/2022   Procedure: LOOP RECORDER INSERTION;  Surgeon: Regan Lemming, MD;  Location: [facility] INVASIVE CV LAB;  Service: Cardiovascular;  Laterality: N/A;   RADIOLOGY WITH ANESTHESIA N/A 07/25/2022   Procedure: RADIOLOGY WITH ANESTHESIA;  Surgeon: Radiologist, Medication, MD;  Location: [facility] OR;  Service: Radiology;  Laterality: N/A;   TEE WITHOUT CARDIOVERSION N/A 07/28/2022   Procedure: TRANSESOPHAGEAL ECHOCARDIOGRAM;  Surgeon: Parke Poisson, MD;  Location: [facility] INVASIVE CV LAB;  Service: Cardiovascular;  Laterality: N/A;   Patient Active Problem List   Diagnosis Date Noted   Adjustment disorder with mixed anxiety and depressed mood 08/07/2022   Cerebrovascular accident (CVA) due to occlusion of left posterior communicating artery (HCC) 08/01/2022   Acute ischemic left ACA stroke (HCC) 07/25/2022   Arterial ischemic stroke, ACA (anterior cerebral artery), left, acute (HCC) 07/25/2022   DYSLIPIDEMIA 04/24/2007   RENAL FAILURE  NOS 11/29/2006   HYPERTENSION, ESSENTIAL NOS 11/12/2006    ONSET DATE: 07/25/2022   REFERRING DIAG: I61.9 (ICD-10-CM) - CVA (cerebrovascular accident due to intracerebral hemorrhage) (HCC)  THERAPY DIAG:  Aphasia  Rationale for Evaluation and Treatment: Rehabilitation  SUBJECTIVE:   SUBJECTIVE STATEMENT: "I know I have aphasia" Pt accompanied by: self  PERTINENT HISTORY: Patient is a 47 y.o. male with PMH: HTN, noncompliance with medications who was brought to ED with sudden onset of aphasia and right sided weakness. MRI brain showeed left ACA territory infarct, cytotoxic edema with no hemorrhagic transformation, multiple additional scattered mostly punctate ischemic foci in the left greater than right MCA, PCA, and occasionally cerebellar artery territories; No significant intracranial mass effect. He received TNK and underwent mechanical thrombectomy on 07/25/22. Received ST on CIR for aphasia  PAIN:  Are you having pain? No  FALLS: Has patient fallen in last 6 months?  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, part time chef  PATIENT GOALS: "To get back to normal and walk"  OBJECTIVE:   COGNITION: Overall cognitive status: Within functional limits for tasks assessed Areas of impairment:  Processing speed Functional deficits:   AUDITORY COMPREHENSION: Overall auditory comprehension: Impaired: moderately complex YES/NO questions: Impaired: moderately complex Following directions: Impaired: moderately complex Conversation: Moderately  Complex Interfering components: processing speed Effective technique: extra processing time, pausing, repetition/stressing words, slowed speech, and visual/gestural cues  READING COMPREHENSION: Impaired:  sentence  EXPRESSION: verbal  VERBAL EXPRESSION: Level of generative/spontaneous verbalization: conversation Automatic speech: name: intact  Repetition: Impaired: sentence Naming: Confrontation: 76-100% and Divergent: 26-50% Pragmatics: Appears intact Comments:  Interfering components: N/A Effective technique: semantic cues and sentence completion Non-verbal means of communication: N/A  WRITTEN EXPRESSION: Dominant hand: right Written expression: Impaired: sentence  MOTOR SPEECH: Overall motor speech: Appears intact Level of impairment:  N/A Respiration: thoracic breathing Phonation: normal Resonance: WFL Articulation: Appears intact Intelligibility: Intelligible Motor planning: Appears intact Motor speech errors:  N/A Interfering components:  N/A   ORAL MOTOR EXAMINATION: Overall status: WFL Comments: denies weakness or numbness  STANDARDIZED ASSESSMENTS: QAB: Moderate - 6.40  PATIENT REPORTED OUTCOME MEASURES (PROM): Communication Participation Item Bank: 48/30 - [name] rated a 2, or "a little" difficulty talking with people he knows as well as strangers, communicating in a small groups, having a long and detailed conversation,, getting a turn in a fast conversation and trying to persuade others to see his point of view   TODAY'S TREATMENT:                                                                                                                                         DATE:  09/25/22 (eval day): Initiated training in compensations for auditory comprehension and processing. Provided aphasia ID cards, aphasia education - see patient instructions   PATIENT EDUCATION: Education details: See Today's Treatment; aphasia ed, compensatory strategies, see patient instructions Person educated: Patient Education method: Explanation, Demonstration, Verbal cues, and Handouts Education comprehension: verbal cues required and needs further education   GOALS: Goals  reviewed with patient? Yes  SHORT TERM GOALS: Target date: 10/23/22  Pt will name 15 items in personally relevant category with rare min A Baseline: 9 items Goal status: INITIAL  2.  Pt will use verbal compensations 4/5 word finding episodes with rare min A Baseline: 1/5 Goal status: INITIAL  3.  Pt will generate complex sentences in structured tasks 4/5x with occasional min A Baseline: no complex sentences Goal status: INITIAL  4.  Pt will carry over 3 strategies to compensate for auditory comprehension Baseline: No strategies Goal status: INITIAL  LONG TERM GOALS: Target date: 12/18/22  Pt will use verbal compensations for aphasia 5/5x in conversation with rare min A Baseline:  Goal status: INITIAL  2.  Pt will generate complex sentences in simple conversation 4/5 turns with occasional min A Baseline: simple sentences only Goal status: INITIAL  3.  Pt will use compensatory strategies to gain a turn in group or fast moving conversation 3x outside of therapy Baseline: no strategy for this Goal status: INITIAL  4.  Pt will reduce fillers of "um" and "ah" to 5 or less over 5 sentences Baseline: 15+ fillers over 3 sentence  narrative Goal status: INITIAL  ASSESSMENT:  CLINICAL IMPRESSION: Patient is a 47 y.o. male who was seen today for moderate aphasia characterized by halting, fillers due to anomia. Auditory comprehension impaired for moderately complex information and questions which impacts his comprehension of medical, insurance, legal information. Keyan is not consistently using compensations for aphasia. He is using remote, iphone, short texts and microwave successfully. He has returned to bill paying with min A from family. I recommend skilled ST to maximize communication for safety, independence, and return to PLOF.   OBJECTIVE IMPAIRMENTS: include aphasia. These impairments are limiting patient from return to work, ADLs/IADLs, and effectively communicating at home  and in community. Factors affecting potential to achieve goals and functional outcome are financial resources. Patient will benefit from skilled SLP services to address above impairments and improve overall function.  REHAB POTENTIAL: Good  PLAN:  SLP FREQUENCY: 2x/week  SLP DURATION: 12 weeks  PLANNED INTERVENTIONS: Language facilitation, Environmental controls, Cueing hierachy, Cognitive reorganization, Internal/external aids, Functional tasks, Multimodal communication approach, and SLP instruction and feedback    Hy Swiatek, Radene Journey, CCC-SLP 09/25/2022, 1:12 PM

## 2022-09-25 NOTE — Patient Instructions (Signed)
   3 ring binder to bring to all therapies  Get the persons attention before you speak  Use eye contact and face the person you are speaking to  Be in close proximity to the person you are speaking to  Turn down any noise in the environment such as the TV, walk away from loud appliances, air conditioners, fans, dish washers etc  In large gatherings, sit or stay on the side not the center of the room  Try to sit with a wall behind you or in a corner so noise isn't coming at you from all directions when dining out or attending gatherings  Repeat back what you heard "Let me make sure I got this, what I heard you say is...Marland KitchenMarland Kitchen"  Ask for information in writing   [employer] job giving clues when you can't think of a word

## 2022-09-25 NOTE — Therapy (Signed)
OUTPATIENT PHYSICAL THERAPY NEURO EVALUATION   Patient Name: Joseph Bright MRN: [id number] DOB:04-04-1975, 47 y.o., male Today's Date: 09/25/2022   PCP: Noberto Retort, MD REFERRING PROVIDER: Charlton Amor, PA-C  END OF SESSION:  PT End of Session - 09/25/22 1148     Visit Number 1    Number of Visits 17   with eval   Date for PT Re-Evaluation 12/18/22    Authorization Type [employer]    PT Start Time 1147   from SLP eval   PT Stop Time 1223   eval   PT Time Calculation (min) 36 min    Equipment Utilized During Treatment Gait belt    Activity Tolerance Patient tolerated treatment well    Behavior During Therapy WFL for tasks assessed/performed             Past Medical History:  Diagnosis Date   Hypertension    Stroke [facility])    Past Surgical History:  Procedure Laterality Date   IR CT HEAD LTD  08/07/2022   IR INTRA CRAN STENT  08/07/2022   IR PERCUTANEOUS ART THROMBECTOMY/INFUSION INTRACRANIAL INC DIAG ANGIO  07/25/2022   LOOP RECORDER INSERTION N/A 07/28/2022   Procedure: LOOP RECORDER INSERTION;  Surgeon: Regan Lemming, MD;  Location: [facility] INVASIVE CV LAB;  Service: Cardiovascular;  Laterality: N/A;   RADIOLOGY WITH ANESTHESIA N/A 07/25/2022   Procedure: RADIOLOGY WITH ANESTHESIA;  Surgeon: Radiologist, Medication, MD;  Location: [facility] OR;  Service: Radiology;  Laterality: N/A;   TEE WITHOUT CARDIOVERSION N/A 07/28/2022   Procedure: TRANSESOPHAGEAL ECHOCARDIOGRAM;  Surgeon: Parke Poisson, MD;  Location: [facility] INVASIVE CV LAB;  Service: Cardiovascular;  Laterality: N/A;   Patient Active Problem List   Diagnosis Date Noted   Adjustment disorder with mixed anxiety and depressed mood 08/07/2022   Cerebrovascular accident (CVA) due to occlusion of left posterior communicating artery (HCC) 08/01/2022   Acute ischemic left ACA stroke (HCC) 07/25/2022   Arterial ischemic stroke, ACA (anterior cerebral artery), left, acute (HCC) 07/25/2022   DYSLIPIDEMIA 04/24/2007    RENAL FAILURE NOS 11/29/2006   HYPERTENSION, ESSENTIAL NOS 11/12/2006    ONSET DATE: 08/24/2022 (referral date)  REFERRING DIAG: I63.9 (ICD-10-CM) - CVA (cerebral vascular accident) (HCC)  THERAPY DIAG:  Muscle weakness (generalized)  Other abnormalities of gait and mobility  Unsteadiness on feet  Rationale for Evaluation and Treatment: Rehabilitation  SUBJECTIVE:                                                                                                                                                                                             SUBJECTIVE STATEMENT: Pt reports  that he had a stroke and was then referred to OPPT after he d/c home. Pt reports ongoing aphasia and R-sided weakness. Pt reports that he feels like his walking and his balance need the most work. Prior to his stroke pt was working for [employer].  Pt accompanied by: self  PERTINENT HISTORY:  hypertension, CKD stage IV followed by outpatient nephrology  PAIN:  Are you having pain? No  PRECAUTIONS: Fall and ICD/Pacemaker  WEIGHT BEARING RESTRICTIONS: No  FALLS: Has patient fallen in last 6 months? Yes. Number of falls 1, fell last weekend at home trying to do something by himself, he bumped his head, his ex-wife helped him to get back up after the fall  LIVING ENVIRONMENT: Lives with:  lives with ex-wife Lives in: House/apartment Stairs: No Has following equipment at home: Environmental consultant - 2 wheeled, Wheelchair (manual), shower chair, and bed side commode  PLOF: Needs assistance with ADLs, Needs assistance with homemaking, Needs assistance with gait, and Needs assistance with transfers  PATIENT GOALS: "to walk"  OBJECTIVE:   DIAGNOSTIC FINDINGS:  Brain MRI 07/26/22 IMPRESSION: 1. Confluent Left ACA territory infarct. Cytotoxic edema with no hemorrhagic transformation.   2. Multiple additional scattered mostly punctate ischemic foci in the left greater than right MCA, PCA, and occasionally  cerebellar artery territories. No significant intracranial mass effect.   3. 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   4. See intracranial MRA is reported separately today.    COGNITION: Overall cognitive status: Within functional limits for tasks assessed   SENSATION: N/T in R hemibody, impaired sensation though light touch appears intact  EDEMA:  Swelling in RLE/R foot  POSTURE: rounded shoulders, forward head, and posterior pelvic tilt   LOWER EXTREMITY MMT:    MMT Right Eval Left Eval  Hip flexion 1 5  Hip extension    Hip abduction    Hip adduction    Hip internal rotation    Hip external rotation    Knee flexion 1 5  Knee extension 1 5  Ankle dorsiflexion 0 5  Ankle plantarflexion    Ankle inversion    Ankle eversion    (Blank rows = not tested)  BED MOBILITY:  Mod I with bedrail  TRANSFERS: Assistive device utilized: Environmental consultant - 2 wheeled  Sit to stand: SBA Stand to sit: SBA Chair to chair: SBA Floor:  not assessed at eval  Pt transfers bed to w/c with SBA at home   GAIT: Gait pattern:  vaulting on LLE, R hip ER, decreased hip/knee flexion- Right, decreased ankle dorsiflexion- Right, narrow BOS, and poor foot clearance- Right Distance walked: various clinic distances Assistive device utilized: Environmental consultant - 2 wheeled Level of assistance: CGA Comments: frequent catching of RLE along the floor even with use of AFO  FUNCTIONAL TESTS:   [facility] PT Assessment - 09/25/22 1213       Ambulation/Gait   Gait velocity 32.8 ft over 48.9 sec = 0.67 ft/sec      Standardized Balance Assessment   Standardized Balance Assessment Timed Up and Go Test;Five Times Sit to Stand    Five times sit to stand comments  35.69 sec   with LUE support and RW     Timed Up and Go Test   TUG Normal TUG    Normal TUG (seconds) 67   with RW             TODAY'S TREATMENT:  PT Evaluation    PATIENT EDUCATION: Education details: Eval findings, PT POC Person educated: Patient Education method: Medical illustrator Education comprehension: verbalized understanding, returned demonstration, and needs further education  HOME EXERCISE PROGRAM: To be initiated (has HEP from CIR as well)  GOALS: Goals reviewed with patient? Yes  SHORT TERM GOALS: Target date: 10/23/2022  Pt will be independent with initial outpatient HEP for improved strength, balance, transfers and gait. Baseline: Goal status: INITIAL  2.  Pt will improve 5 x STS to less than or equal to 30 seconds to demonstrate improved functional strength and transfer efficiency.  Baseline: 35.69 sec (7/1) Goal status: INITIAL  3.  Pt will improve normal TUG to less than or equal to 55 seconds for improved functional mobility and decreased fall risk. Baseline: 67 sec with RW (7/1) Goal status: INITIAL  4.  Pt will improve gait velocity to at least .90 ft/sec for improved gait efficiency and performance at CGA level  Baseline: 0.67 ft/sec with RW and CGA (7/1) Goal status: INITIAL  5.  Pt will ambulate x 150 ft with LRAD and CGA to demonstrate improved endurance and increased independence with functional mobility. Baseline: 32.8 ft with RW and CGA Goal status: INITIAL  6.  Pt will initiate stair training as safe and able. Baseline:  Goal status: INITIAL  LONG TERM GOALS: Target date: 11/23/2022   Pt will be independent with final HEP for improved strength, balance, transfers and gait. Baseline:  Goal status: INITIAL  2.  Pt will improve 5 x STS to less than or equal to 25 seconds to demonstrate improved functional strength and transfer efficiency.  Baseline: 35.69 sec (7/1) Goal status: INITIAL  3.  Pt will improve normal TUG to less than or equal to 45 seconds for improved functional mobility and decreased fall  risk. Baseline: 67 sec with RW (7/1) Goal status: INITIAL  4.  Pt will improve gait velocity to at least 1.25 ft/sec for improved gait efficiency and performance at SBA level  Baseline: 0.67 ft/sec with RW and CGA (7/1) Goal status: INITIAL  5.  Pt will ambulate x 300 ft with LRAD and SBA to demonstrate improved endurance and increased independence with functional mobility. Baseline: 32.8 ft with RW and CGA Goal status: INITIAL  6.  Pt will ascend/descend 12 stairs with LRAD and min A to demonstrate increased independence with functional mobility. Baseline: not assessed at eval Goal status: INITIAL  ASSESSMENT:  CLINICAL IMPRESSION: Patient is a 47 year old male referred to Neuro OPPT for acute CVA.   Pt's PMH is significant for: hypertension, CKD stage IV followed outpatient nephrology. The following deficits were present during the exam: decreased LE strength, impaired sensation, impaired balance, decreased endurance, gait impairments, and decreased safety and independence with functional mobility. Based on his history of falls along with his 5xSTS score, gait speed, and TUG score, pt is an increased risk for falls. Pt would benefit from skilled PT to address these impairments and functional limitations to maximize functional mobility independence.   OBJECTIVE IMPAIRMENTS: Abnormal gait, cardiopulmonary status limiting activity, decreased activity tolerance, decreased balance, decreased coordination, decreased endurance, decreased mobility, difficulty walking, decreased strength, increased edema, impaired sensation, and impaired UE functional use.   ACTIVITY LIMITATIONS: carrying, lifting, bending, standing, squatting, stairs, transfers, bed mobility, bathing, toileting, dressing, reach over head, and hygiene/grooming  PARTICIPATION LIMITATIONS: meal prep, cleaning, laundry, interpersonal relationship, driving, shopping, community activity, and occupation  PERSONAL FACTORS: 1-2  comorbidities:    hypertension, CKD  stage IV followed outpatient nephrology are also affecting patient's functional outcome.   REHAB POTENTIAL: Good  CLINICAL DECISION MAKING: Stable/uncomplicated  EVALUATION COMPLEXITY: Moderate  PLAN:  PT FREQUENCY: 2x/week  PT DURATION: 8 weeks  PLANNED INTERVENTIONS: Therapeutic exercises, Therapeutic activity, Neuromuscular re-education, Balance training, Gait training, Patient/Family education, Self Care, Joint mobilization, Stair training, Vestibular training, Canalith repositioning, Visual/preceptual remediation/compensation, Orthotic/Fit training, DME instructions, Aquatic Therapy, Dry Needling, Wheelchair mobility training, Cryotherapy, Moist heat, Taping, Manual therapy, and Re-evaluation  PLAN FOR NEXT SESSION: check BP, review CIR HEP and then progress HEP, work on RLE [employer] and strengthening, gait with RW; has debrillator so no e-stim   Peter Congo, PT Peter Congo, PT, DPT, CSRS  09/25/2022, 12:56 PM

## 2022-09-26 NOTE — Progress Notes (Signed)
[facility] Summary Report / Loop Recorder 

## 2022-09-27 ENCOUNTER — Encounter: Payer: Self-pay | Admitting: Occupational Therapy

## 2022-09-27 ENCOUNTER — Ambulatory Visit: Payer: Managed Care, Other (non HMO) | Admitting: Speech Pathology

## 2022-09-27 ENCOUNTER — Encounter: Payer: Self-pay | Admitting: Speech Pathology

## 2022-09-27 ENCOUNTER — Ambulatory Visit: Payer: Managed Care, Other (non HMO) | Admitting: Physical Therapy

## 2022-09-27 ENCOUNTER — Ambulatory Visit: Payer: Managed Care, Other (non HMO) | Admitting: Occupational Therapy

## 2022-09-27 DIAGNOSIS — R2681 Unsteadiness on feet: Secondary | ICD-10-CM

## 2022-09-27 DIAGNOSIS — M6281 Muscle weakness (generalized): Secondary | ICD-10-CM

## 2022-09-27 DIAGNOSIS — R4701 Aphasia: Secondary | ICD-10-CM

## 2022-09-27 DIAGNOSIS — R2689 Other abnormalities of gait and mobility: Secondary | ICD-10-CM

## 2022-09-27 DIAGNOSIS — R278 Other lack of coordination: Secondary | ICD-10-CM

## 2022-09-27 NOTE — Therapy (Signed)
OUTPATIENT OCCUPATIONAL THERAPY NEURO TREATMENT  Patient Name: Joseph Bright MRN: [id number] DOB:03/03/1976, 47 y.o., male Today's Date: 09/27/2022  PCP: Noberto Retort, MD  REFERRING PROVIDER: Charlton Amor, PA-C   END OF SESSION:  OT End of Session - 09/27/22 0932     Visit Number 2    Number of Visits 17    Date for OT Re-Evaluation 11/20/22   Date adjusted as pt was not wanting to schedue visits the week of 09/18/2022   Authorization Type [employer] (60 VL - OT, PT, ST)    OT Start Time 0930    OT Stop Time 1015    OT Time Calculation (min) 45 min    Activity Tolerance Patient tolerated treatment well    Behavior During Therapy [facility] for tasks assessed/performed             Past Medical History:  Diagnosis Date   Hypertension    Stroke [facility])    Past Surgical History:  Procedure Laterality Date   IR CT HEAD LTD  08/07/2022   IR INTRA CRAN STENT  08/07/2022   IR PERCUTANEOUS ART THROMBECTOMY/INFUSION INTRACRANIAL INC DIAG ANGIO  07/25/2022   LOOP RECORDER INSERTION N/A 07/28/2022   Procedure: LOOP RECORDER INSERTION;  Surgeon: Regan Lemming, MD;  Location: [facility] INVASIVE CV LAB;  Service: Cardiovascular;  Laterality: N/A;   RADIOLOGY WITH ANESTHESIA N/A 07/25/2022   Procedure: RADIOLOGY WITH ANESTHESIA;  Surgeon: Radiologist, Medication, MD;  Location: [facility] OR;  Service: Radiology;  Laterality: N/A;   TEE WITHOUT CARDIOVERSION N/A 07/28/2022   Procedure: TRANSESOPHAGEAL ECHOCARDIOGRAM;  Surgeon: Parke Poisson, MD;  Location: [facility] INVASIVE CV LAB;  Service: Cardiovascular;  Laterality: N/A;   Patient Active Problem List   Diagnosis Date Noted   Adjustment disorder with mixed anxiety and depressed mood 08/07/2022   Cerebrovascular accident (CVA) due to occlusion of left posterior communicating artery (HCC) 08/01/2022   Acute ischemic left ACA stroke (HCC) 07/25/2022   Arterial ischemic stroke, ACA (anterior cerebral artery), left, acute (HCC) 07/25/2022   DYSLIPIDEMIA  04/24/2007   RENAL FAILURE NOS 11/29/2006   HYPERTENSION, ESSENTIAL NOS 11/12/2006    ONSET DATE: 07/25/2022  REFERRING DIAG: I61.9 (ICD-10-CM) - CVA (cerebrovascular accident due to intracerebral hemorrhage)   THERAPY DIAG:  Other lack of coordination  Muscle weakness (generalized)  Unsteadiness on feet  Rationale for Evaluation and Treatment: Rehabilitation  SUBJECTIVE:   SUBJECTIVE STATEMENT: I had a fall a couple weeks ago when my family was in town. No injuries.   Pt accompanied by: significant other - Joseph Bright  PERTINENT HISTORY: Pt presented to ED 07/25/2022. "Cranial CT scan negative for acute changes. There was chronic appearing right thalamic lacunar infarct. 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 Status post TNK with interventional radiology for stenting. MRI follow-up showed confluent left ACA territory infarction. Cytotoxic edema with no hemorrhagic transformation. Multiple additional scattered mostly punctate ischemic foci in the left greater than right MCA, PCA and occasionally cerebellar artery territories."  Pt in IRF from 08/01/2022 -08/31/2022 then discharged to OP OT, PT, and ST.   PRECAUTIONS: Fall  WEIGHT BEARING RESTRICTIONS: No  PAIN:  Are you having pain? Yes - Rt shoulder 0-3/10 w/ higher level reaching only  FALLS: Has patient fallen in last 6 months? No  LIVING ENVIRONMENT: Lives with: lives with their family Lives in: House/apartment Stairs: No Has following equipment at home: Dan Humphreys - 2 wheeled, Environmental consultant - 4 wheeled, Wheelchair (manual), bed side  commode, and Grab bars  PLOF: Independent; full-time working for [employer]; driving; sports (football and basketball)  PATIENT GOALS: return to PLOF  OBJECTIVE:   HAND DOMINANCE: Right  ADLs: Overall ADLs: min A  IADLs: Shopping: dependent Light housekeeping: dependent Community mobility: dependent  Handwriting: Not legible  MOBILITY STATUS: Needs  Assist: manual wheelchair  ACTIVITY TOLERANCE: Activity tolerance: fair  FUNCTIONAL OUTCOME MEASURES: FOTO: Slight: 59   UPPER EXTREMITY ROM:     AROM Right (eval) Left (eval)  Shoulder flexion WNL WNL  Shoulder abduction WNL WNL  Elbow flexion WNL WNL  Elbow extension WNL WNL  Wrist flexion WNL WNL  Wrist extension WNL WNL  Wrist pronation WNL WNL  Wrist supination WNL WNL   Digit Composite Flexion WNL WNL  Digit Composite Extension WNL WNL  Digit Opposition WNL WNL  (Blank rows = not tested)  RUE: IR to hip; overall bradykinesia with tremor  UPPER EXTREMITY MMT:     MMT Right (eval) Left (eval)  Shoulder flexion 4/5 WNL  Shoulder abduction 4/5 WNL  Elbow flexion 4/5 WNL  Elbow extension 4/5 WNL  (Blank rows = not tested)  HAND FUNCTION: Grip strength: Right: 38.8 lbs; Left: 82.8 lbs  COORDINATION: 9 Hole Peg test: Right: 139 sec; Left: 32 sec  SENSATION: Paresthesias reported in R fingertips  EDEMA: none reported or observed  MUSCLE TONE: WFL  COGNITION: Overall cognitive status: Within functional limits for tasks assessed  VISION: Subjective report: no changes Baseline vision: Wears glasses for reading only Visual history:  none  VISION ASSESSMENT: WFL  PERCEPTION: WFL  PRAXIS: WFL  OBSERVATIONS: Pt appears well-kept. He is able to propel w/c with increased time. Has RW with him.   TODAY'S TREATMENT:                                                                                                                               Reviewed coordination and putty HEP previously issued. Issued additional coordination HEP w/ modifications prn - see pt instructions.   Pt also practiced writing w/ various writing aids - pt did best with regular pen and adapted pen. Issued adapted pen. Pt with decreased control w/ writing. Also recommended angling body to table and to support forearm fully on table. Pt also encouraged to eat with Rt dominant hand as  much as possible, as pt was eating with Lt non dominant hand.   PATIENT EDUCATION: Education details: additional coordination HEP  Person educated: Patient and Spouse Education method: Explanation Education comprehension: verbalized understanding  HOME EXERCISE PROGRAM: 09/12/2022: Chilton Si putty and coordination HEPs 09/27/22: Additional coordination HEP   GOALS:  SHORT TERM GOALS: Target date: 10/10/2022    Patient will demonstrate independence with initial RUE HEP. Baseline: Goal status: INITIAL  2.  Patient will demonstrate at least 45 lbs R grip strength as needed to open jars and other containers. Baseline: 38.8 lbs Goal status: INITIAL  3.  Pt will report  no difficulty holding a dish while washing it.  Baseline: with some difficulty Goal status: INITIAL   LONG TERM GOALS: Target date: 11/17/2022    Patient will demonstrate updated RUE HEP with 25% verbal cues or less for proper execution. Baseline:  Goal status: INITIAL  2.  Patient will demonstrate at least 50 lbs R grip strength as needed to open jars and other containers. Baseline: 38.8 lbs Goal status: INITIAL  3.  Patient will complete nine-hole peg with use of R in 60 seconds or less. Baseline: 139 seconds Goal status: INITIAL  4.  Pt will complete d/c FOTO.  Baseline:  Goal status: INITIAL   ASSESSMENT:  CLINICAL IMPRESSION: Patient tolerating HEP well - pt with difficulty using Rt hand and performing in hand manipulation   PERFORMANCE DEFICITS: in functional skills including ADLs, IADLs, coordination, dexterity, strength, Fine motor control, Gross motor control, mobility, endurance, and UE functional use  IMPAIRMENTS: are limiting patient from ADLs, IADLs, work, and leisure.   CO-MORBIDITIES: may have co-morbidities  that affects occupational performance. Patient will benefit from skilled OT to address above impairments and improve overall function.  MODIFICATION OR ASSISTANCE TO COMPLETE  EVALUATION: No modification of tasks or assist necessary to complete an evaluation.  OT OCCUPATIONAL PROFILE AND HISTORY: Problem focused assessment: Including review of records relating to presenting problem.  CLINICAL DECISION MAKING: LOW - limited treatment options, no task modification necessary  REHAB POTENTIAL: Good  EVALUATION COMPLEXITY: Low    PLAN:  OT FREQUENCY: 2x/week  OT DURATION: 8 weeks  PLANNED INTERVENTIONS: self care/ADL training, therapeutic exercise, therapeutic activity, neuromuscular re-education, manual therapy, functional mobility training, splinting, ultrasound, fluidotherapy, patient/family education, DME and/or AE instructions, and Re-evaluation  RECOMMENDED OTHER SERVICES: Receiving PT and ST  CONSULTED AND AGREED WITH PLAN OF CARE: Patient and family member/caregiver  PLAN FOR NEXT SESSION: Review additional coordination ex's prn, assess shoulder and consider scapula strengthening and high level sh flexion    Sheran Lawless, OT 09/27/2022, 9:32 AM

## 2022-09-27 NOTE — Patient Instructions (Addendum)
  Coordination Activities  Perform the following activities for 15 minutes 2 times per day with right hand(s).  Rotate ball in fingertips (clockwise and counter-clockwise). Place 1/2 deck on table, flip cards over 1 at a time. Deal cards with your thumb (Hold deck in hand and push card off top with thumb). Rotate one card in hand (clockwise and counter-clockwise). Pick up coins and stack (3 stacks of 5). Pick up coins or checkers one at a time until you get 3-5 in your hand, then move coins from palm to fingertips to stack one at a time. Practice writing  Screw together nuts and bolts, then unfasten.

## 2022-09-27 NOTE — Therapy (Signed)
OUTPATIENT SPEECH LANGUAGE PATHOLOGY TREATMENT NOTE   Patient Name: Joseph Bright MRN: [id number] DOB:12/17/1975, 47 y.o., male Today's Date: 09/27/2022  PCP: Noberto Retort, MD REFERRING PROVIDER: Charlton Amor, PA-C  END OF SESSION:   End of Session - 09/27/22 0845     Visit Number 2    Number of Visits 25    Date for SLP Re-Evaluation 12/18/22    Authorization Type c[employer] medicaid - 60 combined - each discipline counts as 1 visit -  physical is his primary concern    SLP Start Time 0845    SLP Stop Time  0930    SLP Time Calculation (min) 45 min    Activity Tolerance Patient tolerated treatment well             Past Medical History:  Diagnosis Date   Hypertension    Stroke [facility])    Past Surgical History:  Procedure Laterality Date   IR CT HEAD LTD  08/07/2022   IR INTRA CRAN STENT  08/07/2022   IR PERCUTANEOUS ART THROMBECTOMY/INFUSION INTRACRANIAL INC DIAG ANGIO  07/25/2022   LOOP RECORDER INSERTION N/A 07/28/2022   Procedure: LOOP RECORDER INSERTION;  Surgeon: Regan Lemming, MD;  Location: [facility] INVASIVE CV LAB;  Service: Cardiovascular;  Laterality: N/A;   RADIOLOGY WITH ANESTHESIA N/A 07/25/2022   Procedure: RADIOLOGY WITH ANESTHESIA;  Surgeon: Radiologist, Medication, MD;  Location: [facility] OR;  Service: Radiology;  Laterality: N/A;   TEE WITHOUT CARDIOVERSION N/A 07/28/2022   Procedure: TRANSESOPHAGEAL ECHOCARDIOGRAM;  Surgeon: Parke Poisson, MD;  Location: [facility] INVASIVE CV LAB;  Service: Cardiovascular;  Laterality: N/A;   Patient Active Problem List   Diagnosis Date Noted   Adjustment disorder with mixed anxiety and depressed mood 08/07/2022   Cerebrovascular accident (CVA) due to occlusion of left posterior communicating artery (HCC) 08/01/2022   Acute ischemic left ACA stroke (HCC) 07/25/2022   Arterial ischemic stroke, ACA (anterior cerebral artery), left, acute (HCC) 07/25/2022   DYSLIPIDEMIA 04/24/2007   RENAL FAILURE NOS 11/29/2006    HYPERTENSION, ESSENTIAL NOS 11/12/2006    ONSET DATE: 07/25/22  REFERRING DIAG: I61.9 (ICD-10-CM) - CVA (cerebrovascular accident due to intracerebral hemorrhage) (HCC)  THERAPY DIAG:  Aphasia  Rationale for Evaluation and Treatment: Rehabilitation  SUBJECTIVE:   SUBJECTIVE STATEMENT:  PAIN:  Are you having pain? No, ex-spouse, Joseph Bright present  OBJECTIVE:   TODAY'S TREATMENT:                                                                                                                                         DATE:    09/27/22: Caregiver, ex-spouse present. Educated her re: impaired auditory comprehension and need for caregiver to provide A with medical, legal and insurance interactions. Trained pt and caregiver on compensations for auditory comprehension. See patient instructions from eval.  To target complex sentence generation and word finding,  Scientist, product/process development ([employer])  was utilized. The pt generated 3 subjects and objects for 4 verbs, (throw, wash, measure, decorate) for a total of 12 subject objects. Pt required occasional to usual semantic cues. Pt generated 4 complex sentences by answering "wh" questions. Pt required occasional min A cues to generate complex sentences. Targeted sentence generation generating sentences for multiple meaning words with usual cues to used varied subjects and to generate sentence rather than definition. Joseph Bright generated 10 multiple meaning sentences with frequent extended time for word finding, however with initial cues to limit fillers, he did not use fillers 80% of time. Trained in verbal compensations for word finding generating 3 sentence descriptions of 10 basic objects with usual extended time and occasional questioning cues. Caregiver aware to encourage/cue Joseph Bright to use verbal compensations at home.    09/25/22 (eval day): Initiated training in compensations for auditory comprehension and processing. Provided aphasia ID  cards, aphasia education - see patient instructions     PATIENT EDUCATION: Education details: See Today's Treatment; aphasia ed, compensatory strategies, see patient instructions Person educated: Patient Education method: Explanation, Demonstration, Verbal cues, and Handouts Education comprehension: verbal cues required and needs further education     GOALS: Goals reviewed with patient? Yes   SHORT TERM GOALS: Target date: 10/23/22   Pt will name 15 items in personally relevant category with rare min A Baseline: 9 items Goal status: INITIAL   2.  Pt will use verbal compensations 4/5 word finding episodes with rare min A Baseline: 1/5 Goal status: INITIAL   3.  Pt will generate complex sentences in structured tasks 4/5x with occasional min A Baseline: no complex sentences Goal status: INITIAL   4.  Pt will carry over 3 strategies to compensate for auditory comprehension Baseline: No strategies Goal status: INITIAL   LONG TERM GOALS: Target date: 12/18/22   Pt will use verbal compensations for aphasia 5/5x in conversation with rare min A Baseline:  Goal status: INITIAL   2.  Pt will generate complex sentences in simple conversation 4/5 turns with occasional min A Baseline: simple sentences only Goal status: INITIAL   3.  Pt will use compensatory strategies to gain a turn in group or fast moving conversation 3x outside of therapy Baseline: no strategy for this Goal status: INITIAL   4.  Pt will reduce fillers of "um" and "ah" to 5 or less over 5 sentences Baseline: 15+ fillers over 3 sentence narrative Goal status: INITIAL   ASSESSMENT:   CLINICAL IMPRESSION: Patient is a 47 y.o. male who was seen today for moderate aphasia characterized by halting, fillers due to anomia. Auditory comprehension impaired for moderately complex information and questions which impacts his comprehension of medical, insurance, legal information. Joseph Bright is not consistently using  compensations for aphasia. He is using remote, iphone, short texts and microwave successfully. He has returned to bill paying with min A from family. I recommend skilled ST to maximize communication for safety, independence, and return to PLOF.    OBJECTIVE IMPAIRMENTS: include aphasia. These impairments are limiting patient from return to work, ADLs/IADLs, and effectively communicating at home and in community. Factors affecting potential to achieve goals and functional outcome are financial resources. Patient will benefit from skilled SLP services to address above impairments and improve overall function.   REHAB POTENTIAL: Good   PLAN:   SLP FREQUENCY: 2x/week   SLP DURATION: 12 weeks   PLANNED INTERVENTIONS: Language facilitation, Environmental controls, Cueing hierachy, Cognitive reorganization, Internal/external aids, Functional tasks, Multimodal communication approach, and SLP instruction  and feedback       Joseph Bright, Radene Journey, CCC-SLP 09/27/2022, 12:05 PM

## 2022-09-27 NOTE — Therapy (Signed)
OUTPATIENT PHYSICAL THERAPY NEURO TREATMENT   Patient Name: Joseph Bright MRN: [id number] DOB:1976/01/29, 47 y.o., male Today's Date: 09/27/2022   PCP: Noberto Retort, MD REFERRING PROVIDER: Charlton Amor, PA-C  END OF SESSION:  PT End of Session - 09/27/22 1022     Visit Number 2    Number of Visits 17   with eval   Date for PT Re-Evaluation 12/18/22    Authorization Type [employer]    PT Start Time 1020   pt in restroom   PT Stop Time 1058    PT Time Calculation (min) 38 min    Equipment Utilized During Treatment Gait belt    Activity Tolerance Patient tolerated treatment well    Behavior During Therapy WFL for tasks assessed/performed              Past Medical History:  Diagnosis Date   Hypertension    Stroke [facility])    Past Surgical History:  Procedure Laterality Date   IR CT HEAD LTD  08/07/2022   IR INTRA CRAN STENT  08/07/2022   IR PERCUTANEOUS ART THROMBECTOMY/INFUSION INTRACRANIAL INC DIAG ANGIO  07/25/2022   LOOP RECORDER INSERTION N/A 07/28/2022   Procedure: LOOP RECORDER INSERTION;  Surgeon: Regan Lemming, MD;  Location: [facility] INVASIVE CV LAB;  Service: Cardiovascular;  Laterality: N/A;   RADIOLOGY WITH ANESTHESIA N/A 07/25/2022   Procedure: RADIOLOGY WITH ANESTHESIA;  Surgeon: Radiologist, Medication, MD;  Location: [facility] OR;  Service: Radiology;  Laterality: N/A;   TEE WITHOUT CARDIOVERSION N/A 07/28/2022   Procedure: TRANSESOPHAGEAL ECHOCARDIOGRAM;  Surgeon: Parke Poisson, MD;  Location: [facility] INVASIVE CV LAB;  Service: Cardiovascular;  Laterality: N/A;   Patient Active Problem List   Diagnosis Date Noted   Adjustment disorder with mixed anxiety and depressed mood 08/07/2022   Cerebrovascular accident (CVA) due to occlusion of left posterior communicating artery (HCC) 08/01/2022   Acute ischemic left ACA stroke (HCC) 07/25/2022   Arterial ischemic stroke, ACA (anterior cerebral artery), left, acute (HCC) 07/25/2022   DYSLIPIDEMIA 04/24/2007   RENAL  FAILURE NOS 11/29/2006   HYPERTENSION, ESSENTIAL NOS 11/12/2006    ONSET DATE: 08/24/2022 (referral date)  REFERRING DIAG: I63.9 (ICD-10-CM) - CVA (cerebral vascular accident) (HCC)  THERAPY DIAG:  Muscle weakness (generalized)  Unsteadiness on feet  Other abnormalities of gait and mobility  Rationale for Evaluation and Treatment: Rehabilitation  SUBJECTIVE:                                                                                                                                                                                             SUBJECTIVE STATEMENT: Pt reports no  falls or acute changes since last visit.  Pt accompanied by: self ex wife and caregiver [name]  PERTINENT HISTORY:  hypertension, CKD stage IV followed by outpatient nephrology  PAIN:  Are you having pain? No  PRECAUTIONS: Fall and ICD/Pacemaker  WEIGHT BEARING RESTRICTIONS: No  FALLS: Has patient fallen in last 6 months? Yes. Number of falls 1, fell last weekend at home trying to do something by himself, he bumped his head, his ex-wife helped him to get back up after the fall  LIVING ENVIRONMENT: Lives with:  lives with ex-wife Lives in: House/apartment Stairs: No Has following equipment at home: Environmental consultant - 2 wheeled, Wheelchair (manual), shower chair, and bed side commode  PLOF: Needs assistance with ADLs, Needs assistance with homemaking, Needs assistance with gait, and Needs assistance with transfers  PATIENT GOALS: "to walk"  OBJECTIVE:   DIAGNOSTIC FINDINGS:  Brain MRI 07/26/22 IMPRESSION: 1. Confluent Left ACA territory infarct. Cytotoxic edema with no hemorrhagic transformation.   2. Multiple additional scattered mostly punctate ischemic foci in the left greater than right MCA, PCA, and occasionally cerebellar artery territories. No significant intracranial mass effect.   3. 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   4. See intracranial  MRA is reported separately today.    COGNITION: Overall cognitive status: Within functional limits for tasks assessed   SENSATION: N/T in R hemibody, impaired sensation though light touch appears intact  EDEMA:  Swelling in RLE/R foot  POSTURE: rounded shoulders, forward head, and posterior pelvic tilt   LOWER EXTREMITY MMT:    MMT Right Eval Left Eval  Hip flexion 1 5  Hip extension    Hip abduction    Hip adduction    Hip internal rotation    Hip external rotation    Knee flexion 1 5  Knee extension 1 5  Ankle dorsiflexion 0 5  Ankle plantarflexion    Ankle inversion    Ankle eversion    (Blank rows = not tested)   TODAY'S TREATMENT:                                                                                                                              TherEx Reviewed HEP that pt received in CIR: Supine HS curls with max A needed due to significant RLE extensor tone Supine HS stretch (difficulty performing in this position due to tone) Supine bridges x 10 reps with assist needed to stabilize RLE Pt reports he is able to perform sidelying clamshells, did not review this session  Upgraded/revised previous HEP: Long-sitting HS stretch with some assist needed for full knee extension Staggered sit to stands with focus on increased WB through RLE during transfer  Encouraged pt to reach out to Dr. Kirtland Bouchard to schedule a follow-up sooner than September for tone management of R hemibody.   Gait Gait pattern:  vaulting with LLE, decreased hip/knee flexion- Right, decreased ankle dorsiflexion- Right, and narrow BOS Distance  walked: 90 ft Assistive device utilized: Walker - 2 wheeled Level of assistance: Mod A Comments: encouraged pt to keep L heel on the ground when stepping with RLE, pt with difficulty advancing limb due to weakness; provided physical assist to advance RLE with focus on keeping L heel on the ground during stance    PATIENT EDUCATION: Education  details: reviewed/upgraded HEP and provided new handout Person educated: Patient and Caregiver exwife [name] Education method: Explanation, Demonstration, and Handouts Education comprehension: verbalized understanding, returned demonstration, and needs further education  HOME EXERCISE PROGRAM: Access Code: [email or web address] URL: [email or web address] Date: 09/27/2022 Prepared by: Peter Congo  Exercises - Hooklying Heel Slide  - 1 x daily - 7 x weekly - 2 sets - 10-15 reps - Supine Bridge  - 1 x daily - 7 x weekly - 2-3 sets - 15 reps - Clamshell  - 1 x daily - 7 x weekly - 2 sets - 10 reps - Sidelying Reverse Clamshell  - 1 x daily - 7 x weekly - 2 sets - 10 reps - Long Sitting Hamstring Stretch  - 1 x daily - 7 x weekly - 1 sets - 3-5 reps - 30 sec hold - Staggered Sit-to-Stand  - 1 x daily - 7 x weekly - 3 sets - 5 reps   GOALS: Goals reviewed with patient? Yes  SHORT TERM GOALS: Target date: 10/23/2022  Pt will be independent with initial outpatient HEP for improved strength, balance, transfers and gait. Baseline: Goal status: INITIAL  2.  Pt will improve 5 x STS to less than or equal to 30 seconds to demonstrate improved functional strength and transfer efficiency.  Baseline: 35.69 sec (7/1) Goal status: INITIAL  3.  Pt will improve normal TUG to less than or equal to 55 seconds for improved functional mobility and decreased fall risk. Baseline: 67 sec with RW (7/1) Goal status: INITIAL  4.  Pt will improve gait velocity to at least .90 ft/sec for improved gait efficiency and performance at CGA level  Baseline: 0.67 ft/sec with RW and CGA (7/1) Goal status: INITIAL  5.  Pt will ambulate x 150 ft with LRAD and CGA to demonstrate improved endurance and increased independence with functional mobility. Baseline: 32.8 ft with RW and CGA Goal status: INITIAL  6.  Pt will initiate stair training as safe and able. Baseline:  Goal status: INITIAL  LONG TERM GOALS:  Target date: 11/23/2022   Pt will be independent with final HEP for improved strength, balance, transfers and gait. Baseline:  Goal status: INITIAL  2.  Pt will improve 5 x STS to less than or equal to 25 seconds to demonstrate improved functional strength and transfer efficiency.  Baseline: 35.69 sec (7/1) Goal status: INITIAL  3.  Pt will improve normal TUG to less than or equal to 45 seconds for improved functional mobility and decreased fall risk. Baseline: 67 sec with RW (7/1) Goal status: INITIAL  4.  Pt will improve gait velocity to at least 1.25 ft/sec for improved gait efficiency and performance at SBA level  Baseline: 0.67 ft/sec with RW and CGA (7/1) Goal status: INITIAL  5.  Pt will ambulate x 300 ft with LRAD and SBA to demonstrate improved endurance and increased independence with functional mobility. Baseline: 32.8 ft with RW and CGA Goal status: INITIAL  6.  Pt will ascend/descend 12 stairs with LRAD and min A to demonstrate increased independence with functional mobility. Baseline: not assessed at eval Goal status: INITIAL  ASSESSMENT:  CLINICAL IMPRESSION: Emphasis of skilled PT session on reviewing HEP from CIR and revising/upgrading HEP. Pt is limited in his ability to perform hamstring strengthening and stretching due to extensor tone in his RLE. Encouraged pt to reach out to Dr. Kirtland Bouchard to schedule an appointment sooner than September to discuss spasticity management. Additionally, pt's caregiver has difficulty assisting him with stretching his HS in supine due to this tone. Pt and caregiver able to perform this stretch more easily in long-sitting position so revised current HEP. Pt also exhibits decreased WB through his RLE with transfers so worked on staggered sit to stands and added to [employer]. Pt continues to exhibit gait deviations as noted above and benefits from continued skilled therapy services to work towards increasing his safety and independence with functional  mobility. Continue POC.    OBJECTIVE IMPAIRMENTS: Abnormal gait, cardiopulmonary status limiting activity, decreased activity tolerance, decreased balance, decreased coordination, decreased endurance, decreased mobility, difficulty walking, decreased strength, increased edema, impaired sensation, and impaired UE functional use.   ACTIVITY LIMITATIONS: carrying, lifting, bending, standing, squatting, stairs, transfers, bed mobility, bathing, toileting, dressing, reach over head, and hygiene/grooming  PARTICIPATION LIMITATIONS: meal prep, cleaning, laundry, interpersonal relationship, driving, shopping, community activity, and occupation  PERSONAL FACTORS: 1-2 comorbidities:    hypertension, CKD stage IV followed outpatient nephrology are also affecting patient's functional outcome.   REHAB POTENTIAL: Good  CLINICAL DECISION MAKING: Stable/uncomplicated  EVALUATION COMPLEXITY: Moderate  PLAN:  PT FREQUENCY: 2x/week  PT DURATION: 8 weeks  PLANNED INTERVENTIONS: Therapeutic exercises, Therapeutic activity, Neuromuscular re-education, Balance training, Gait training, Patient/Family education, Self Care, Joint mobilization, Stair training, Vestibular training, Canalith repositioning, Visual/preceptual remediation/compensation, Orthotic/Fit training, DME instructions, Aquatic Therapy, Dry Needling, Wheelchair mobility training, Cryotherapy, Moist heat, Taping, Manual therapy, and Re-evaluation  PLAN FOR NEXT SESSION: check BP, how is revised HEP?, work on RLE NMR and strengthening, gait with RW--needs to work on decreasing vaulting with his LLE when stepping with RLE (could do forward/backward steps at mat or in // bars); has debrillator so no e-stim   Peter Congo, PT Peter Congo, PT, DPT, CSRS  09/27/2022, 10:59 AM

## 2022-09-29 ENCOUNTER — Other Ambulatory Visit (HOSPITAL_COMMUNITY): Payer: Self-pay

## 2022-10-02 ENCOUNTER — Other Ambulatory Visit (HOSPITAL_COMMUNITY): Payer: Self-pay

## 2022-10-02 ENCOUNTER — Encounter: Payer: Self-pay | Admitting: Speech Pathology

## 2022-10-02 ENCOUNTER — Ambulatory Visit: Payer: Managed Care, Other (non HMO) | Admitting: Physical Therapy

## 2022-10-02 ENCOUNTER — Ambulatory Visit: Payer: Managed Care, Other (non HMO) | Admitting: Speech Pathology

## 2022-10-02 VITALS — BP 103/73 | HR 72

## 2022-10-02 DIAGNOSIS — R2689 Other abnormalities of gait and mobility: Secondary | ICD-10-CM

## 2022-10-02 DIAGNOSIS — R4701 Aphasia: Secondary | ICD-10-CM

## 2022-10-02 DIAGNOSIS — R2681 Unsteadiness on feet: Secondary | ICD-10-CM

## 2022-10-02 DIAGNOSIS — M6281 Muscle weakness (generalized): Secondary | ICD-10-CM | POA: Diagnosis not present

## 2022-10-02 NOTE — Therapy (Signed)
OUTPATIENT PHYSICAL THERAPY NEURO TREATMENT   Patient Name: Joseph Bright MRN: [id number] DOB:December 28, 1975, 47 y.o., male Today's Date: 10/02/2022   PCP: Noberto Retort, MD REFERRING PROVIDER: Charlton Amor, PA-C  END OF SESSION:  PT End of Session - 10/02/22 0802     Visit Number 3    Number of Visits 17   with eval   Date for PT Re-Evaluation 12/18/22    Authorization Type [employer]    PT Start Time 0800    PT Stop Time 0840    PT Time Calculation (min) 40 min    Equipment Utilized During Treatment Gait belt    Activity Tolerance Treatment limited secondary to medical complications (Comment)   orthostatic   Behavior During Therapy [facility] for tasks assessed/performed               Past Medical History:  Diagnosis Date   Hypertension    Stroke [facility])    Past Surgical History:  Procedure Laterality Date   IR CT HEAD LTD  08/07/2022   IR INTRA CRAN STENT  08/07/2022   IR PERCUTANEOUS ART THROMBECTOMY/INFUSION INTRACRANIAL INC DIAG ANGIO  07/25/2022   LOOP RECORDER INSERTION N/A 07/28/2022   Procedure: LOOP RECORDER INSERTION;  Surgeon: Regan Lemming, MD;  Location: [facility] INVASIVE CV LAB;  Service: Cardiovascular;  Laterality: N/A;   RADIOLOGY WITH ANESTHESIA N/A 07/25/2022   Procedure: RADIOLOGY WITH ANESTHESIA;  Surgeon: Radiologist, Medication, MD;  Location: [facility] OR;  Service: Radiology;  Laterality: N/A;   TEE WITHOUT CARDIOVERSION N/A 07/28/2022   Procedure: TRANSESOPHAGEAL ECHOCARDIOGRAM;  Surgeon: Parke Poisson, MD;  Location: [facility] INVASIVE CV LAB;  Service: Cardiovascular;  Laterality: N/A;   Patient Active Problem List   Diagnosis Date Noted   Adjustment disorder with mixed anxiety and depressed mood 08/07/2022   Cerebrovascular accident (CVA) due to occlusion of left posterior communicating artery (HCC) 08/01/2022   Acute ischemic left ACA stroke (HCC) 07/25/2022   Arterial ischemic stroke, ACA (anterior cerebral artery), left, acute (HCC) 07/25/2022    DYSLIPIDEMIA 04/24/2007   RENAL FAILURE NOS 11/29/2006   HYPERTENSION, ESSENTIAL NOS 11/12/2006    ONSET DATE: 08/24/2022 (referral date)  REFERRING DIAG: I63.9 (ICD-10-CM) - CVA (cerebral vascular accident) (HCC)  THERAPY DIAG:  Muscle weakness (generalized)  Unsteadiness on feet  Other abnormalities of gait and mobility  Rationale for Evaluation and Treatment: Rehabilitation  SUBJECTIVE:                                                                                                                                                                                             SUBJECTIVE STATEMENT: Pt  reports no acute changes since last visit, no complaints of pain today. [name] reports he is doing better with RLE stretches after adjustment last week, he is better able to stretch his hamstrings in long-sitting position.  Pt accompanied by: self ex wife and caregiver [name]  PERTINENT HISTORY:  hypertension, CKD stage IV followed by outpatient nephrology  PAIN:  Are you having pain? No  PRECAUTIONS: Fall and ICD/Pacemaker  WEIGHT BEARING RESTRICTIONS: No  FALLS: Has patient fallen in last 6 months? Yes. Number of falls 1, fell last weekend at home trying to do something by himself, he bumped his head, his ex-wife helped him to get back up after the fall  LIVING ENVIRONMENT: Lives with:  lives with ex-wife Lives in: House/apartment Stairs: No Has following equipment at home: Environmental consultant - 2 wheeled, Wheelchair (manual), shower chair, and bed side commode  PLOF: Needs assistance with ADLs, Needs assistance with homemaking, Needs assistance with gait, and Needs assistance with transfers  PATIENT GOALS: "to walk"  OBJECTIVE:   DIAGNOSTIC FINDINGS:  Brain MRI 07/26/22 IMPRESSION: 1. Confluent Left ACA territory infarct. Cytotoxic edema with no hemorrhagic transformation.   2. Multiple additional scattered mostly punctate ischemic foci in the left greater than right MCA, PCA,  and occasionally cerebellar artery territories. No significant intracranial mass effect.   3. 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   4. See intracranial MRA is reported separately today.    COGNITION: Overall cognitive status: Within functional limits for tasks assessed   SENSATION: N/T in R hemibody, impaired sensation though light touch appears intact  EDEMA:  Swelling in RLE/R foot  POSTURE: rounded shoulders, forward head, and posterior pelvic tilt   LOWER EXTREMITY MMT:    MMT Right Eval Left Eval  Hip flexion 1 5  Hip extension    Hip abduction    Hip adduction    Hip internal rotation    Hip external rotation    Knee flexion 1 5  Knee extension 1 5  Ankle dorsiflexion 0 5  Ankle plantarflexion    Ankle inversion    Ankle eversion    (Blank rows = not tested)   TODAY'S TREATMENT:                  TherAct Vitals:   10/02/22 0805 10/02/22 0939  BP: (!) 154/99 103/73  Pulse: 72 72   Seated BP assessed at beginning of session (as noted above), pt with hypertension but reports he did take his BP meds prior to therapy and has a follow-up with his PCP tomorrow to adjust medications. Pt does have onset of dizziness following standing activity, assisted into supine position. Following BP readings taken:  Supine 141/96, HR 54 Sitting 119/80, HR 66 Standing 103/73, HR 72  Pt found to be orthostatic this session, has not experienced this in previous PT sessions. Pt given BP readings from this session and encouraged him to discuss BP with his PCP during his appointment tomorrow. Discussed methods of BP management including medication adjustment by PCP, use of compression stockings, use of BLE ACE wrap vs abdominal binder. Will continue to assess BP readings during PT sessions and monitor response to standing activity.   NMR Standing at [facility] with use of RW and min A to focus on decreased LLE vaulting during  gait: Forward/backward stepping with RLE to target, focus on keeping L heel planted and not vaulting to advance RLE Pt requires min A for advancement of RLE with onset  of fatigue, 2 x 10 reps RLE hip abd x 5 reps, difficulty clearing limb from the floor Added in 2" step under LLE, pt able to perform 2 x 10 reps with min A for RLE management  Pt with sudden onset of dizziness, assisted into supine position. Provided cool wash cloth and water, vitals assessed as noted above. Pt feels better after supine rest break, returned to sitting EOM with min A for RLE management. Provided pt with water. Seated and standing BP as noted above. Pt found to be orthostatic this session, deferred further standing activity. Stand pivot transfer back to w/c with CGA.                                                                                                PATIENT EDUCATION: Education details: continue HEP, BP readings and importance of BP management and monitoring Person educated: Patient and Caregiver exwife [name] Education method: Explanation Education comprehension: verbalized understanding and needs further education  HOME EXERCISE PROGRAM: Access Code: [email or web address] URL: [email or web address] Date: 09/27/2022 Prepared by: Peter Congo  Exercises - Hooklying Heel Slide  - 1 x daily - 7 x weekly - 2 sets - 10-15 reps - Supine Bridge  - 1 x daily - 7 x weekly - 2-3 sets - 15 reps - Clamshell  - 1 x daily - 7 x weekly - 2 sets - 10 reps - Sidelying Reverse Clamshell  - 1 x daily - 7 x weekly - 2 sets - 10 reps - Long Sitting Hamstring Stretch  - 1 x daily - 7 x weekly - 1 sets - 3-5 reps - 30 sec hold - Staggered Sit-to-Stand  - 1 x daily - 7 x weekly - 3 sets - 5 reps   GOALS: Goals reviewed with patient? Yes  SHORT TERM GOALS: Target date: 10/23/2022  Pt will be independent with initial outpatient HEP for improved strength, balance, transfers and gait. Baseline: Goal status:  INITIAL  2.  Pt will improve 5 x STS to less than or equal to 30 seconds to demonstrate improved functional strength and transfer efficiency.  Baseline: 35.69 sec (7/1) Goal status: INITIAL  3.  Pt will improve normal TUG to less than or equal to 55 seconds for improved functional mobility and decreased fall risk. Baseline: 67 sec with RW (7/1) Goal status: INITIAL  4.  Pt will improve gait velocity to at least .90 ft/sec for improved gait efficiency and performance at CGA level  Baseline: 0.67 ft/sec with RW and CGA (7/1) Goal status: INITIAL  5.  Pt will ambulate x 150 ft with LRAD and CGA to demonstrate improved endurance and increased independence with functional mobility. Baseline: 32.8 ft with RW and CGA Goal status: INITIAL  6.  Pt will initiate stair training as safe and able. Baseline:  Goal status: INITIAL  LONG TERM GOALS: Target date: 11/23/2022   Pt will be independent with final HEP for improved strength, balance, transfers and gait. Baseline:  Goal status: INITIAL  2.  Pt will improve 5 x STS to less than or equal to 25 seconds to demonstrate  improved functional strength and transfer efficiency.  Baseline: 35.69 sec (7/1) Goal status: INITIAL  3.  Pt will improve normal TUG to less than or equal to 45 seconds for improved functional mobility and decreased fall risk. Baseline: 67 sec with RW (7/1) Goal status: INITIAL  4.  Pt will improve gait velocity to at least 1.25 ft/sec for improved gait efficiency and performance at SBA level  Baseline: 0.67 ft/sec with RW and CGA (7/1) Goal status: INITIAL  5.  Pt will ambulate x 300 ft with LRAD and SBA to demonstrate improved endurance and increased independence with functional mobility. Baseline: 32.8 ft with RW and CGA Goal status: INITIAL  6.  Pt will ascend/descend 12 stairs with LRAD and min A to demonstrate increased independence with functional mobility. Baseline: not assessed at eval Goal status:  INITIAL  ASSESSMENT:  CLINICAL IMPRESSION: Emphasis of skilled PT session on working on breaking down gait mechanics to decrease LLE compensations during gait, working on RLE NMR and strengthening, and monitoring vitals and discussing orthostatic hypotension. Pt exhibits improved ability to step with RLE with decreased LLE vaulting with task breakdown, unable to assess carryover to gait this session due to pt becoming orthostatic. Pt continues to benefit from skilled therapy services to work towards improving RLE strength and control in order to decrease his fall risk and increase his safety and independence with functional mobility. Pt also continues to benefit from monitoring of his vitals due to onset of [place] this session. Continue POC.   OBJECTIVE IMPAIRMENTS: Abnormal gait, cardiopulmonary status limiting activity, decreased activity tolerance, decreased balance, decreased coordination, decreased endurance, decreased mobility, difficulty walking, decreased strength, increased edema, impaired sensation, and impaired UE functional use.   ACTIVITY LIMITATIONS: carrying, lifting, bending, standing, squatting, stairs, transfers, bed mobility, bathing, toileting, dressing, reach over head, and hygiene/grooming  PARTICIPATION LIMITATIONS: meal prep, cleaning, laundry, interpersonal relationship, driving, shopping, community activity, and occupation  PERSONAL FACTORS: 1-2 comorbidities:    hypertension, CKD stage IV followed outpatient nephrology are also affecting patient's functional outcome.   REHAB POTENTIAL: Good  CLINICAL DECISION MAKING: Stable/uncomplicated  EVALUATION COMPLEXITY: Moderate  PLAN:  PT FREQUENCY: 2x/week  PT DURATION: 8 weeks  PLANNED INTERVENTIONS: Therapeutic exercises, Therapeutic activity, Neuromuscular re-education, Balance training, Gait training, Patient/Family education, Self Care, Joint mobilization, Stair training, Vestibular training, Canalith repositioning,  Visual/preceptual remediation/compensation, Orthotic/Fit training, DME instructions, Aquatic Therapy, Dry Needling, Wheelchair mobility training, Cryotherapy, Moist heat, Taping, Manual therapy, and Re-evaluation  PLAN FOR NEXT SESSION: check BP/monitor for orthostatic hypotension, how was PCP visit on 7/9? work on RLE NMR and strengthening, gait with RW--needs to work on decreasing vaulting with his LLE when stepping with RLE (could do forward/backward steps at mat or in // bars, sit to stands with step under LLE); has defibrillator so no e-stim   Peter Congo, PT Peter Congo, PT, DPT, CSRS  10/02/2022, 9:40 AM

## 2022-10-02 NOTE — Therapy (Signed)
OUTPATIENT SPEECH LANGUAGE PATHOLOGY TREATMENT NOTE   Patient Name: Joseph Bright MRN: [id number] DOB:[birth year], 47 y.o., male Today's Date: 10/02/2022  PCP: Joseph Retort, MD REFERRING PROVIDER: Charlton Amor, PA-C  END OF SESSION:   End of Session - 10/02/22 0844     Visit Number 3    Number of Visits 25    Date for SLP Re-Evaluation 12/18/22    Authorization Type cign[employer] medicaid - 60 combined - each discipline counts as 1 visit -  physical is his primary concern    SLP Start Time 0845    SLP Stop Time  0930    SLP Time Calculation (min) 45 min    Activity Tolerance Treatment limited secondary to medical complications (Comment)             Past Medical History:  Diagnosis Date   Hypertension    Stroke [facility])    Past Surgical History:  Procedure Laterality Date   IR CT HEAD LTD  08/07/2022   IR INTRA CRAN STENT  08/07/2022   IR PERCUTANEOUS ART THROMBECTOMY/INFUSION INTRACRANIAL INC DIAG ANGIO  07/25/2022   LOOP RECORDER INSERTION N/A 07/28/2022   Procedure: LOOP RECORDER INSERTION;  Surgeon: Joseph Lemming, MD;  Location: [facility] INVASIVE CV LAB;  Service: Cardiovascular;  Laterality: N/A;   RADIOLOGY WITH ANESTHESIA N/A 07/25/2022   Procedure: RADIOLOGY WITH ANESTHESIA;  Surgeon: Radiologist, Medication, MD;  Location: [facility] OR;  Service: Radiology;  Laterality: N/A;   TEE WITHOUT CARDIOVERSION N/A 07/28/2022   Procedure: TRANSESOPHAGEAL ECHOCARDIOGRAM;  Surgeon: Joseph Poisson, MD;  Location: [facility] INVASIVE CV LAB;  Service: Cardiovascular;  Laterality: N/A;   Patient Active Problem List   Diagnosis Date Noted   Adjustment disorder with mixed anxiety and depressed mood 08/07/2022   Cerebrovascular accident (CVA) due to occlusion of left posterior communicating artery (HCC) 08/01/2022   Acute ischemic left ACA stroke (HCC) 07/25/2022   Arterial ischemic stroke, ACA (anterior cerebral artery), left, acute (HCC) 07/25/2022   DYSLIPIDEMIA 04/24/2007   RENAL FAILURE  NOS 11/29/2006   HYPERTENSION, ESSENTIAL NOS 11/12/2006    ONSET DATE: 07/25/22  REFERRING DIAG: I61.9 (ICD-10-CM) - CVA (cerebrovascular accident due to intracerebral hemorrhage) (HCC)  THERAPY DIAG:  Aphasia  Rationale for Evaluation and Treatment: Rehabilitation  SUBJECTIVE:   SUBJECTIVE STATEMENT:  PAIN:  Are you having pain? No, ex-spouse, Joseph Bright present  OBJECTIVE:   TODAY'S TREATMENT:                                                                                                                                         DATE:   10/02/22: To target word finding, complex sentence generation  Scientist, product/process development (VNeST) was utilized. The pt generated 3 subjects and objects for 4 verbs (build, chase, chase, climb curl), for a total of 12 subject objects. Pt required usual min semantic cues for 3rd subject/object for each verb  cues. Pt generated 4 complex sentences by answering "wh" questions. Pt required rare min cues to generate complex sentences. Frequent extended time due to word finding. During VNeST, [name] required verbal cues to use compensations for word finding - verbal cues to use gestures as well. Tab trained to accept gesture cues as well as verbal cues. Semantic Feature Analysis (SFA) completed to target word finding and verbal compensations for aphasia - with usual min to mod questioning and semantic cues, [name] complete SFA chart for volcano, dominoes, dice (to generate salient descriptions). In moderately complex conversation re: legalized betting, [name] required frequent verbal cues to use compensations, semantic cues for word finding. Frequent halting/fillers over 8 minute conversation.    09/27/22: Caregiver, ex-spouse present. Educated her re: impaired auditory comprehension and need for caregiver to provide A with medical, legal and insurance interactions. Trained pt and caregiver on compensations for auditory comprehension. See patient  instructions from eval.  To target complex sentence generation and word finding,  Scientist, product/process development (VNeST) was utilized. The pt generated 3 subjects and objects for 4 verbs, (throw, wash, measure, decorate) for a total of 12 subject objects. Pt required occasional to usual semantic cues. Pt generated 4 complex sentences by answering "wh" questions. Pt required occasional min A cues to generate complex sentences. Targeted sentence generation generating sentences for multiple meaning words with usual cues to used varied subjects and to generate sentence rather than definition. [name] generated 10 multiple meaning sentences with frequent extended time for word finding, however with initial cues to limit fillers, he did not use fillers 80% of time. Trained in verbal compensations for word finding generating 3 sentence descriptions of 10 basic objects with usual extended time and occasional questioning cues. Caregiver aware to encourage/cue [name] to use verbal compensations at home.    09/25/22 (eval day): Initiated training in compensations for auditory comprehension and processing. Provided aphasia ID cards, aphasia education - see patient instructions     PATIENT EDUCATION: Education details: See Today's Treatment; aphasia ed, compensatory strategies, see patient instructions Person educated: Patient Education method: Explanation, Demonstration, Verbal cues, and Handouts Education comprehension: verbal cues required and needs further education     GOALS: Goals reviewed with patient? Yes   SHORT TERM GOALS: Target date: 10/23/22   Pt will name 15 items in personally relevant category with rare min A Baseline: 9 items Goal status: INITIAL   2.  Pt will use verbal compensations 4/5 word finding episodes with rare min A Baseline: 1/5 Goal status: INITIAL   3.  Pt will generate complex sentences in structured tasks 4/5x with occasional min A Baseline: no complex  sentences Goal status: INITIAL   4.  Pt will carry over 3 strategies to compensate for auditory comprehension Baseline: No strategies Goal status: INITIAL   LONG TERM GOALS: Target date: 12/18/22   Pt will use verbal compensations for aphasia 5/5x in conversation with rare min A Baseline:  Goal status: INITIAL   2.  Pt will generate complex sentences in simple conversation 4/5 turns with occasional min A Baseline: simple sentences only Goal status: INITIAL   3.  Pt will use compensatory strategies to gain a turn in group or fast moving conversation 3x outside of therapy Baseline: no strategy for this Goal status: INITIAL   4.  Pt will reduce fillers of "um" and "ah" to 5 or less over 5 sentences Baseline: 15+ fillers over 3 sentence narrative Goal status: INITIAL   ASSESSMENT:   CLINICAL IMPRESSION:  Patient is a 47 y.o. male who was seen today for moderate aphasia characterized by halting, fillers due to anomia. Auditory comprehension impaired for moderately complex information and questions which impacts his comprehension of medical, insurance, legal information. Mattias is not consistently using compensations for aphasia. He is using remote, iphone, short texts and microwave successfully. He has returned to bill paying with min A from family. I recommend skilled ST to maximize communication for safety, independence, and return to PLOF.    OBJECTIVE IMPAIRMENTS: include aphasia. These impairments are limiting patient from return to work, ADLs/IADLs, and effectively communicating at home and in community. Factors affecting potential to achieve goals and functional outcome are financial resources. Patient will benefit from skilled SLP services to address above impairments and improve overall function.   REHAB POTENTIAL: Good   PLAN:   SLP FREQUENCY: 2x/week   SLP DURATION: 12 weeks   PLANNED INTERVENTIONS: Language facilitation, Environmental controls, Cueing hierachy,  Cognitive reorganization, Internal/external aids, Functional tasks, Multimodal communication approach, and SLP instruction and feedback       Thy Gullikson, Radene Journey, CCC-SLP 10/02/2022, 9:38 AM

## 2022-10-03 ENCOUNTER — Ambulatory Visit (HOSPITAL_COMMUNITY)
Admission: RE | Admit: 2022-10-03 | Discharge: 2022-10-03 | Disposition: A | Discharge status: Home / Self Care | Payer: Managed Care, Other (non HMO) | Source: Ambulatory Visit | Attending: Student | Admitting: Student

## 2022-10-04 ENCOUNTER — Encounter: Payer: Self-pay | Admitting: Occupational Therapy

## 2022-10-04 ENCOUNTER — Ambulatory Visit: Payer: Managed Care, Other (non HMO) | Admitting: Speech Pathology

## 2022-10-04 ENCOUNTER — Ambulatory Visit: Payer: Managed Care, Other (non HMO) | Admitting: Occupational Therapy

## 2022-10-04 DIAGNOSIS — R2681 Unsteadiness on feet: Secondary | ICD-10-CM

## 2022-10-04 DIAGNOSIS — M6281 Muscle weakness (generalized): Secondary | ICD-10-CM

## 2022-10-04 DIAGNOSIS — R278 Other lack of coordination: Secondary | ICD-10-CM

## 2022-10-04 DIAGNOSIS — R4701 Aphasia: Secondary | ICD-10-CM

## 2022-10-04 NOTE — Therapy (Signed)
OUTPATIENT OCCUPATIONAL THERAPY NEURO TREATMENT  Patient Name: Joseph Bright MRN: [id number] DOB:Aug 14, 1975, 47 y.o., male Today's Date: 10/04/2022  PCP: Noberto Retort, MD  REFERRING PROVIDER: Charlton Amor, PA-C   END OF SESSION:  OT End of Session - 10/04/22 4098     Visit Number 3    Number of Visits 17    Date for OT Re-Evaluation 11/20/22   Date adjusted as pt was not wanting to schedue visits the week of 09/18/2022   Authorization Type [employer] (60 VL - OT, PT, ST)    OT Start Time 0925    OT Stop Time 1010    OT Time Calculation (min) 45 min    Activity Tolerance Patient tolerated treatment well    Behavior During Therapy [facility] for tasks assessed/performed             Past Medical History:  Diagnosis Date   Hypertension    Stroke [facility])    Past Surgical History:  Procedure Laterality Date   IR CT HEAD LTD  08/07/2022   IR INTRA CRAN STENT  08/07/2022   IR PERCUTANEOUS ART THROMBECTOMY/INFUSION INTRACRANIAL INC DIAG ANGIO  07/25/2022   LOOP RECORDER INSERTION N/A 07/28/2022   Procedure: LOOP RECORDER INSERTION;  Surgeon: Regan Lemming, MD;  Location: [facility] INVASIVE CV LAB;  Service: Cardiovascular;  Laterality: N/A;   RADIOLOGY WITH ANESTHESIA N/A 07/25/2022   Procedure: RADIOLOGY WITH ANESTHESIA;  Surgeon: Radiologist, Medication, MD;  Location: [facility] OR;  Service: Radiology;  Laterality: N/A;   TEE WITHOUT CARDIOVERSION N/A 07/28/2022   Procedure: TRANSESOPHAGEAL ECHOCARDIOGRAM;  Surgeon: Parke Poisson, MD;  Location: [facility] INVASIVE CV LAB;  Service: Cardiovascular;  Laterality: N/A;   Patient Active Problem List   Diagnosis Date Noted   Adjustment disorder with mixed anxiety and depressed mood 08/07/2022   Cerebrovascular accident (CVA) due to occlusion of left posterior communicating artery (HCC) 08/01/2022   Acute ischemic left ACA stroke (HCC) 07/25/2022   Arterial ischemic stroke, ACA (anterior cerebral artery), left, acute (HCC) 07/25/2022    DYSLIPIDEMIA 04/24/2007   RENAL FAILURE NOS 11/29/2006   HYPERTENSION, ESSENTIAL NOS 11/12/2006    ONSET DATE: 07/25/2022  REFERRING DIAG: I61.9 (ICD-10-CM) - CVA (cerebrovascular accident due to intracerebral hemorrhage)   THERAPY DIAG:  Muscle weakness (generalized)  Other lack of coordination  Unsteadiness on feet  Rationale for Evaluation and Treatment: Rehabilitation  SUBJECTIVE:   SUBJECTIVE STATEMENT: Mild pain with high level sh flexion, but goes away with more correct movement  Pt accompanied by: significant other - [name]  PERTINENT HISTORY: Pt presented to ED 07/25/2022. "Cranial CT scan negative for acute changes. There was chronic appearing right thalamic lacunar infarct. 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 Status post TNK with interventional radiology for stenting. MRI follow-up showed confluent left ACA territory infarction. Cytotoxic edema with no hemorrhagic transformation. Multiple additional scattered mostly punctate ischemic foci in the left greater than right MCA, PCA and occasionally cerebellar artery territories."  Pt in IRF from 08/01/2022 -08/31/2022 then discharged to OP OT, PT, and ST.   PRECAUTIONS: Fall  WEIGHT BEARING RESTRICTIONS: No  PAIN:  Are you having pain? Yes - Rt shoulder 0-3/10 w/ higher level reaching only  FALLS: Has patient fallen in last 6 months? No  LIVING ENVIRONMENT: Lives with: lives with their family Lives in: House/apartment Stairs: No Has following equipment at home: Dan Humphreys - 2 wheeled, Environmental consultant - 4 wheeled, Wheelchair (manual), bed side commode, and [employer]  bars  PLOF: Independent; full-time working for [employer]; driving; sports (football and basketball)  PATIENT GOALS: return to PLOF  OBJECTIVE:   HAND DOMINANCE: Right  ADLs: Overall ADLs: min A  IADLs: Shopping: dependent Light housekeeping: dependent Community mobility: dependent  Handwriting: Not  legible  MOBILITY STATUS: Needs Assist: manual wheelchair  ACTIVITY TOLERANCE: Activity tolerance: fair  FUNCTIONAL OUTCOME MEASURES: FOTO: Slight: 59   UPPER EXTREMITY ROM:     AROM Right (eval) Left (eval)  Shoulder flexion WNL WNL  Shoulder abduction WNL WNL  Elbow flexion WNL WNL  Elbow extension WNL WNL  Wrist flexion WNL WNL  Wrist extension WNL WNL  Wrist pronation WNL WNL  Wrist supination WNL WNL   Digit Composite Flexion WNL WNL  Digit Composite Extension WNL WNL  Digit Opposition WNL WNL  (Blank rows = not tested)  RUE: IR to hip; overall bradykinesia with tremor  UPPER EXTREMITY MMT:     MMT Right (eval) Left (eval)  Shoulder flexion 4/5 WNL  Shoulder abduction 4/5 WNL  Elbow flexion 4/5 WNL  Elbow extension 4/5 WNL  (Blank rows = not tested)  HAND FUNCTION: Grip strength: Right: 38.8 lbs; Left: 82.8 lbs  COORDINATION: 9 Hole Peg test: Right: 139 sec; Left: 32 sec  SENSATION: Paresthesias reported in R fingertips  EDEMA: none reported or observed  MUSCLE TONE: WFL  COGNITION: Overall cognitive status: Within functional limits for tasks assessed  VISION: Subjective report: no changes Baseline vision: Wears glasses for reading only Visual history:  none  VISION ASSESSMENT: WFL  PERCEPTION: WFL  PRAXIS: WFL  OBSERVATIONS: Pt appears well-kept. He is able to propel w/c with increased time. Has RW with him.   TODAY'S TREATMENT:                                                                                                                               Pt had no further questions re: additional coordination HEP.   Pt issued HEP for high level flexion (supine), posterior sh girdle and scapula strengthening - see pt instructions for details. Pt required mod cueing for correct positioning on sh ext prone and seated  PATIENT EDUCATION: Education details: shoulder girdle/scapula strengthening HEP  Person educated: Patient and  Spouse Education method: Explanation, Demonstration, Tactile cues, Verbal cues, and Handouts Education comprehension: verbalized understanding, returned demonstration, verbal cues required, and needs further education  HOME EXERCISE PROGRAM: 09/12/2022: Chilton Si putty and coordination HEPs 09/27/22: Additional coordination HEP  10/04/22: shoulder girdle/scapula strengthening HEP    GOALS:  SHORT TERM GOALS: Target date: 10/10/2022    Patient will demonstrate independence with initial RUE HEP. Baseline: Goal status: IN PROGRESS  2.  Patient will demonstrate at least 45 lbs R grip strength as needed to open jars and other containers. Baseline: 38.8 lbs Goal status: INITIAL  3.  Pt will report no difficulty holding a dish while washing it.  Baseline: with some difficulty Goal status: INITIAL  LONG TERM GOALS: Target date: 11/17/2022    Patient will demonstrate updated RUE HEP with 25% verbal cues or less for proper execution. Baseline:  Goal status: INITIAL  2.  Patient will demonstrate at least 50 lbs R grip strength as needed to open jars and other containers. Baseline: 38.8 lbs Goal status: INITIAL  3.  Patient will complete nine-hole peg with use of R in 60 seconds or less. Baseline: 139 seconds Goal status: INITIAL  4.  Pt will complete d/c FOTO.  Baseline:  Goal status: INITIAL   ASSESSMENT:  CLINICAL IMPRESSION: Patient tolerating HEP well - pt with difficulty as posterior sh deltoid weak. Responds well to cueing and very motivated.  PERFORMANCE DEFICITS: in functional skills including ADLs, IADLs, coordination, dexterity, strength, Fine motor control, Gross motor control, mobility, endurance, and UE functional use  IMPAIRMENTS: are limiting patient from ADLs, IADLs, work, and leisure.   CO-MORBIDITIES: may have co-morbidities  that affects occupational performance. Patient will benefit from skilled OT to address above impairments and improve overall  function.  MODIFICATION OR ASSISTANCE TO COMPLETE EVALUATION: No modification of tasks or assist necessary to complete an evaluation.  OT OCCUPATIONAL PROFILE AND HISTORY: Problem focused assessment: Including review of records relating to presenting problem.  CLINICAL DECISION MAKING: LOW - limited treatment options, no task modification necessary  REHAB POTENTIAL: Good  EVALUATION COMPLEXITY: Low    PLAN:  OT FREQUENCY: 2x/week  OT DURATION: 8 weeks  PLANNED INTERVENTIONS: self care/ADL training, therapeutic exercise, therapeutic activity, neuromuscular re-education, manual therapy, functional mobility training, splinting, ultrasound, fluidotherapy, patient/family education, DME and/or AE instructions, and Re-evaluation  RECOMMENDED OTHER SERVICES: Receiving PT and ST  CONSULTED AND AGREED WITH PLAN OF CARE: Patient and family member/caregiver  PLAN FOR NEXT SESSION: Review shoulder/scapula HEP, continue coordination Rt hand   Sheran Lawless, OT 10/04/2022, 9:39 AM

## 2022-10-04 NOTE — Patient Instructions (Signed)
Cranial Flexion: Overhead Arm Extension - Supine ([facility])    Lie with knees bent, arms beyond head, holding _2-3___ lb weight. Pull ball up to above face. Keep pinky finger side down, elbows straight Repeat __10__ times per set. Do __2__ sets per day.   Scapular Retraction (Prone)    Lie with arms at sides. Pinch shoulder blades together and down towards buttocks. Hold 3-5 sec. Repeat __10__ times per set. Do __2__ sessions per day.   On Elbows (Prone)    Rise up on elbows as high as possible by lifting chest, keeping hips on floor. Hold _5___ seconds. Repeat __10__ times per set.  Do __2__ sessions per day.   Extension - Prone (Dumbbell)    Lie with right arm hanging off side of bed. Lift hand back and up. KEEP ARM CLOSE TO BODY AND DO NOT LET ARM TURN IN TOWARDS BODY Repeat __10__ times per set.  Do __2__ sessions per day. NO weight   (Clinic) Retraction: Row - Bilateral (Pulley)    Facing pulley, arms reaching forward, pull hands toward stomach, pinching shoulder blades together. Repeat _10___ times per set. Do _2___ sets per day.    Strengthening: Resisted Extension   SEATED: Hold tubing in __BOTH___ hand(s), arm forward. Pull arm back, elbow straight. Repeat _10___ times per set. Do _2___ sessions per day.

## 2022-10-04 NOTE — Therapy (Signed)
OUTPATIENT SPEECH LANGUAGE PATHOLOGY TREATMENT NOTE   Patient Name: Joseph Bright MRN: [id number] DOB:[birth year], 47 y.o., male Today's Date: 10/04/2022  PCP: Joseph Retort, Bright REFERRING PROVIDER: Charlton Amor, PA-C  END OF SESSION:   End of Session - 10/04/22 1014     Visit Number 4    Number of Visits 25    Date for SLP Re-Evaluation 12/18/22    Authorization Type c[employer] medicaid - 60 combined - each discipline counts as 1 visit -  physical is his primary concern    SLP Start Time 1015    SLP Stop Time  1100    SLP Time Calculation (min) 45 min    Activity Tolerance Patient tolerated treatment well              Past Medical History:  Diagnosis Date   Hypertension    Stroke [facility])    Past Surgical History:  Procedure Laterality Date   IR CT HEAD LTD  08/07/2022   IR INTRA CRAN STENT  08/07/2022   IR PERCUTANEOUS ART THROMBECTOMY/INFUSION INTRACRANIAL INC DIAG ANGIO  07/25/2022   LOOP RECORDER INSERTION N/A 07/28/2022   Procedure: LOOP RECORDER INSERTION;  Surgeon: Joseph Lemming, Bright;  Location: [facility] INVASIVE CV LAB;  Service: Cardiovascular;  Laterality: N/A;   RADIOLOGY WITH ANESTHESIA N/A 07/25/2022   Procedure: RADIOLOGY WITH ANESTHESIA;  Surgeon: Joseph Bright;  Location: [facility] OR;  Service: Radiology;  Laterality: N/A;   TEE WITHOUT CARDIOVERSION N/A 07/28/2022   Procedure: TRANSESOPHAGEAL ECHOCARDIOGRAM;  Surgeon: Joseph Poisson, Bright;  Location: [facility] INVASIVE CV LAB;  Service: Cardiovascular;  Laterality: N/A;   Patient Active Problem List   Diagnosis Date Noted   Adjustment disorder with mixed anxiety and depressed mood 08/07/2022   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 No, ex-spouse, Joseph Bright present  OBJECTIVE:   TODAY'S TREATMENT:                                                                                                                                         DATE:   10/04/22: Led through VNeST with occasional min-A to elaborate and challenge patient to generate more complex vocabulary words. Patient required prompting questions to elaborate on verbs generated while aligning with personal relations. ST transitioned to semantic feature analysis to target word finding. Patient required occasional min-A  to find words and was prompted to describe the words. Twice patient benefited from phonemic cues. Ongoing, consistant verbal cues to use anomia strategies required to A in their use when anomia in conversation or structured task occurred.   10/02/22: To target word finding, complex sentence generation  Scientist, product/process development (VNeST) was utilized. The pt generated 3 subjects and objects for 4 verbs (build, chase, chase, climb curl), for a total of 12 subject objects. Pt required usual min semantic cues for 3rd subject/object for each verb cues. Pt generated 4 complex sentences by answering "wh" questions. Pt required rare min cues to generate complex sentences. Frequent extended time due to word finding. During VNeST, Joseph Bright required verbal cues to use compensations for word finding - verbal cues to use gestures as well. Tab trained to accept gesture cues as well as verbal cues. Semantic Feature Analysis (SFA) completed to target word finding and verbal compensations for aphasia - with usual  min to mod questioning and semantic cues, Joseph Bright complete SFA chart for volcano, dominoes, dice (to generate salient descriptions). In moderately complex conversation re: legalized betting, Joseph Bright required frequent verbal cues to use compensations, semantic cues for word finding. Frequent halting/fillers over 8 minute conversation.    09/27/22: Caregiver, ex-spouse present. Educated her re: impaired auditory comprehension and need for caregiver to provide A with medical, legal and insurance interactions. Trained pt and caregiver on compensations for auditory comprehension. See patient instructions from eval.  To target complex sentence generation and word finding,  Scientist, product/process development (VNeST) was utilized. The pt generated 3 subjects and objects for 4 verbs, (throw, wash, measure, decorate) for a total of 12 subject objects. Pt required occasional to usual semantic cues. Pt generated 4 complex sentences by answering "wh" questions. Pt required occasional min A cues to generate complex sentences. Targeted sentence generation generating sentences for multiple meaning words with usual cues to used varied subjects and to generate sentence rather than definition. Joseph Bright generated 10 multiple meaning sentences with frequent extended time for word finding, however with initial cues to limit fillers, he did not use fillers 80% of time. Trained in verbal compensations for word finding generating 3 sentence descriptions of 10 basic objects with usual extended time and occasional questioning cues. Caregiver aware to encourage/cue Joseph Bright to use verbal compensations at home.  09/25/22 (eval day): Initiated training in compensations for auditory comprehension and processing. Provided aphasia ID cards, aphasia education - see patient instructions     PATIENT EDUCATION: Education details: See Today's Treatment; aphasia ed, compensatory strategies, see patient instructions Person educated:  Patient Education method: Explanation, Demonstration, Verbal cues, and Handouts Education comprehension: verbal cues required and needs further education     GOALS: Goals reviewed with patient? Yes   SHORT TERM GOALS: Target date: 10/23/22   Pt will name 15 items in personally relevant category with rare min A Baseline: 9 items Goal status: INITIAL   2.  Pt will use verbal compensations 4/5 word finding episodes with rare min A Baseline: 1/5 Goal status: INITIAL   3.  Pt will generate complex sentences in structured tasks 4/5x with occasional min A Baseline: no complex sentences Goal status: INITIAL   4.  Pt will carry over 3 strategies to compensate for auditory comprehension Baseline: No strategies Goal status: INITIAL   LONG TERM GOALS: Target date: 12/18/22   Pt will use verbal compensations for aphasia 5/5x in conversation with rare min A Baseline:  Goal status: INITIAL   2.  Pt will generate complex sentences in simple conversation 4/5 turns with occasional min A Baseline: simple sentences only Goal status: INITIAL   3.  Pt will use compensatory strategies to gain a turn in group or fast moving conversation 3x outside of therapy Baseline: no strategy for this Goal status: INITIAL   4.  Pt will reduce fillers of "um" and "ah" to 5 or less over 5 sentences Baseline: 15+ fillers over 3 sentence narrative Goal status: INITIAL   ASSESSMENT:   CLINICAL IMPRESSION: Patient is a 47 y.o. male who was seen today for moderate aphasia characterized by halting, fillers due to anomia. Auditory comprehension impaired for moderately complex information and questions which impacts his comprehension of medical, insurance, legal information. Jehu is not consistently using compensations for aphasia. He is using remote, iphone, short texts and microwave successfully. He has returned to bill paying with min A from family. I recommend skilled ST to maximize communication for safety,  independence, and return to PLOF.    OBJECTIVE IMPAIRMENTS: include aphasia. These impairments are limiting patient from return to work, ADLs/IADLs, and effectively communicating at home and in community. Factors affecting potential to achieve goals and functional outcome are financial resources. Patient will benefit from skilled SLP services to address above impairments and improve overall function.   REHAB POTENTIAL: Good   PLAN:   SLP FREQUENCY: 2x/week   SLP DURATION: 12 weeks   PLANNED INTERVENTIONS: Language facilitation, Environmental controls, Cueing hierachy, Cognitive reorganization, Internal/external aids, Functional tasks, Multimodal communication approach, and SLP instruction and feedback       [place], Student-SLP 10/04/2022, 10:18 AM

## 2022-10-09 ENCOUNTER — Ambulatory Visit: Payer: Managed Care, Other (non HMO) | Admitting: Occupational Therapy

## 2022-10-09 ENCOUNTER — Encounter: Payer: Self-pay | Admitting: Occupational Therapy

## 2022-10-09 ENCOUNTER — Ambulatory Visit (INDEPENDENT_AMBULATORY_CARE_PROVIDER_SITE_OTHER): Payer: Managed Care, Other (non HMO)

## 2022-10-09 ENCOUNTER — Ambulatory Visit: Payer: Managed Care, Other (non HMO) | Admitting: Physical Therapy

## 2022-10-09 VITALS — BP 147/92 | HR 94

## 2022-10-09 DIAGNOSIS — M6281 Muscle weakness (generalized): Secondary | ICD-10-CM

## 2022-10-09 DIAGNOSIS — R2681 Unsteadiness on feet: Secondary | ICD-10-CM

## 2022-10-09 DIAGNOSIS — R278 Other lack of coordination: Secondary | ICD-10-CM

## 2022-10-09 DIAGNOSIS — I63522 Cerebral infarction due to unspecified occlusion or stenosis of left anterior cerebral artery: Secondary | ICD-10-CM | POA: Diagnosis not present

## 2022-10-09 DIAGNOSIS — R2689 Other abnormalities of gait and mobility: Secondary | ICD-10-CM

## 2022-10-09 DIAGNOSIS — R208 Other disturbances of skin sensation: Secondary | ICD-10-CM

## 2022-10-09 NOTE — Therapy (Signed)
OUTPATIENT OCCUPATIONAL THERAPY NEURO TREATMENT  Patient Name: Joseph Bright MRN: [id number] DOB:09-04-1975, 47 y.o., male Today's Date: 10/09/2022  PCP: Noberto Retort, MD  REFERRING PROVIDER: Charlton Amor, PA-C   END OF SESSION:  OT End of Session - 10/09/22 0803     Visit Number 4    Number of Visits 17    Date for OT Re-Evaluation 11/20/22   Date adjusted as pt was not wanting to schedue visits the week of 09/18/2022   Authorization Type [employer] (60 VL - OT, PT, ST)    OT Start Time 0802    OT Stop Time 0845    OT Time Calculation (min) 43 min    Activity Tolerance Patient tolerated treatment well    Behavior During Therapy [facility] for tasks assessed/performed             Past Medical History:  Diagnosis Date   Hypertension    Stroke [facility])    Past Surgical History:  Procedure Laterality Date   IR CT HEAD LTD  08/07/2022   IR INTRA CRAN STENT  08/07/2022   IR PERCUTANEOUS ART THROMBECTOMY/INFUSION INTRACRANIAL INC DIAG ANGIO  07/25/2022   LOOP RECORDER INSERTION N/A 07/28/2022   Procedure: LOOP RECORDER INSERTION;  Surgeon: Regan Lemming, MD;  Location: [facility] INVASIVE CV LAB;  Service: Cardiovascular;  Laterality: N/A;   RADIOLOGY WITH ANESTHESIA N/A 07/25/2022   Procedure: RADIOLOGY WITH ANESTHESIA;  Surgeon: Radiologist, Medication, MD;  Location: [facility] OR;  Service: Radiology;  Laterality: N/A;   TEE WITHOUT CARDIOVERSION N/A 07/28/2022   Procedure: TRANSESOPHAGEAL ECHOCARDIOGRAM;  Surgeon: Parke Poisson, MD;  Location: [facility] INVASIVE CV LAB;  Service: Cardiovascular;  Laterality: N/A;   Patient Active Problem List   Diagnosis Date Noted   Adjustment disorder with mixed anxiety and depressed mood 08/07/2022   Cerebrovascular accident (CVA) due to occlusion of left posterior communicating artery (HCC) 08/01/2022   Acute ischemic left ACA stroke (HCC) 07/25/2022   Arterial ischemic stroke, ACA (anterior cerebral artery), left, acute (HCC) 07/25/2022    DYSLIPIDEMIA 04/24/2007   RENAL FAILURE NOS 11/29/2006   HYPERTENSION, ESSENTIAL NOS 11/12/2006    ONSET DATE: 07/25/2022  REFERRING DIAG: I61.9 (ICD-10-CM) - CVA (cerebrovascular accident due to intracerebral hemorrhage)   THERAPY DIAG:  Other lack of coordination  Muscle weakness (generalized)  Unsteadiness on feet  Other disturbances of skin sensation  Rationale for Evaluation and Treatment: Rehabilitation  SUBJECTIVE:   SUBJECTIVE STATEMENT: No pain and no new falls reported  Pt accompanied by: significant other - [name]  PERTINENT HISTORY: Pt presented to ED 07/25/2022. "Cranial CT scan negative for acute changes. There was chronic appearing right thalamic lacunar infarct. 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 Status post TNK with interventional radiology for stenting. MRI follow-up showed confluent left ACA territory infarction. Cytotoxic edema with no hemorrhagic transformation. Multiple additional scattered mostly punctate ischemic foci in the left greater than right MCA, PCA and occasionally cerebellar artery territories."  Pt in IRF from 08/01/2022 -08/31/2022 then discharged to OP OT, PT, and ST.   PRECAUTIONS: Fall  WEIGHT BEARING RESTRICTIONS: No  PAIN:  Are you having pain? Yes - Rt shoulder 0-3/10 w/ higher level reaching only  FALLS: Has patient fallen in last 6 months? No  LIVING ENVIRONMENT: Lives with: lives with their family Lives in: House/apartment Stairs: No Has following equipment at home: Dan Humphreys - 2 wheeled, Environmental consultant - 4 wheeled, Wheelchair (manual), bed side commode, and Grab bars  PLOF: Independent; full-time working for [employer]; driving; sports (football and basketball)  PATIENT GOALS: return to PLOF  OBJECTIVE:   HAND DOMINANCE: Right  ADLs: Overall ADLs: min A  IADLs: Shopping: dependent Light housekeeping: dependent Community mobility: dependent  Handwriting: Not legible  MOBILITY  STATUS: Needs Assist: manual wheelchair  ACTIVITY TOLERANCE: Activity tolerance: fair  FUNCTIONAL OUTCOME MEASURES: FOTO: Slight: 59   UPPER EXTREMITY ROM:     AROM Right (eval) Left (eval)  Shoulder flexion WNL WNL  Shoulder abduction WNL WNL  Elbow flexion WNL WNL  Elbow extension WNL WNL  Wrist flexion WNL WNL  Wrist extension WNL WNL  Wrist pronation WNL WNL  Wrist supination WNL WNL   Digit Composite Flexion WNL WNL  Digit Composite Extension WNL WNL  Digit Opposition WNL WNL  (Blank rows = not tested)  RUE: IR to hip; overall bradykinesia with tremor  UPPER EXTREMITY MMT:     MMT Right (eval) Left (eval)  Shoulder flexion 4/5 WNL  Shoulder abduction 4/5 WNL  Elbow flexion 4/5 WNL  Elbow extension 4/5 WNL  (Blank rows = not tested)  HAND FUNCTION: Grip strength: Right: 38.8 lbs; Left: 82.8 lbs  COORDINATION: 9 Hole Peg test: Right: 139 sec; Left: 32 sec  SENSATION: Paresthesias reported in R fingertips  EDEMA: none reported or observed  MUSCLE TONE: WFL  COGNITION: Overall cognitive status: Within functional limits for tasks assessed  VISION: Subjective report: no changes Baseline vision: Wears glasses for reading only Visual history:  none  VISION ASSESSMENT: WFL  PERCEPTION: WFL  PRAXIS: WFL  OBSERVATIONS: Pt appears well-kept. He is able to propel w/c with increased time. Has RW with him.   TODAY'S TREATMENT:                                                                                                                               Pt copying peg design with medium sized pegs Rt hand for coordination and visual perceptual skills. Copied design with 100% accuracy, mod difficulty using Rt hand w/ occasional assist from Lt hand to turn peg around for placement  Added pen flipping exercise to coordination HEP   Reviewed sh girdle/scapula strengthening HEP - pt performed each with greater ease but still required cues to perform  correctly (especially sh extension w/ theraband - seated)     PATIENT EDUCATION: Education details: shoulder girdle/scapula strengthening HEP  Person educated: Patient and Spouse Education method: Explanation, Demonstration, Tactile cues, Verbal cues, and Handouts Education comprehension: verbalized understanding, returned demonstration, verbal cues required, and needs further education  HOME EXERCISE PROGRAM: 09/12/2022: [facility] [place] putty and coordination HEPs 09/27/22: Additional coordination HEP  10/04/22: shoulder girdle/scapula strengthening HEP    GOALS:  SHORT TERM GOALS: Target date: 10/10/2022    Patient will demonstrate independence with initial RUE HEP. Baseline: Goal status: IN PROGRESS  2.  Patient will demonstrate at least 45 lbs R grip strength as needed to open jars and  other containers. Baseline: 38.8 lbs Goal status: INITIAL  3.  Pt will report no difficulty holding a dish while washing it.  Baseline: with some difficulty Goal status: INITIAL   LONG TERM GOALS: Target date: 11/17/2022    Patient will demonstrate updated RUE HEP with 25% verbal cues or less for proper execution. Baseline:  Goal status: INITIAL  2.  Patient will demonstrate at least 50 lbs R grip strength as needed to open jars and other containers. Baseline: 38.8 lbs Goal status: INITIAL  3.  Patient will complete nine-hole peg with use of R in 60 seconds or less. Baseline: 139 seconds Goal status: INITIAL  4.  Pt will complete d/c FOTO.  Baseline:  Goal status: INITIAL   ASSESSMENT:  CLINICAL IMPRESSION: Patient with improved strength posterior sh girdle and reports shoulder feeling better.  PERFORMANCE DEFICITS: in functional skills including ADLs, IADLs, coordination, dexterity, strength, Fine motor control, Gross motor control, mobility, endurance, and UE functional use  IMPAIRMENTS: are limiting patient from ADLs, IADLs, work, and leisure.   CO-MORBIDITIES: may have  co-morbidities  that affects occupational performance. Patient will benefit from skilled OT to address above impairments and improve overall function.  MODIFICATION OR ASSISTANCE TO COMPLETE EVALUATION: No modification of tasks or assist necessary to complete an evaluation.  OT OCCUPATIONAL PROFILE AND HISTORY: Problem focused assessment: Including review of records relating to presenting problem.  CLINICAL DECISION MAKING: LOW - limited treatment options, no task modification necessary  REHAB POTENTIAL: Good  EVALUATION COMPLEXITY: Low    PLAN:  OT FREQUENCY: 2x/week  OT DURATION: 8 weeks  PLANNED INTERVENTIONS: self care/ADL training, therapeutic exercise, therapeutic activity, neuromuscular re-education, manual therapy, functional mobility training, splinting, ultrasound, fluidotherapy, patient/family education, DME and/or AE instructions, and Re-evaluation  RECOMMENDED OTHER SERVICES: Receiving PT and ST  CONSULTED AND AGREED WITH PLAN OF CARE: Patient and family member/caregiver  PLAN FOR NEXT SESSION: continue RUE functional use, coordination, UBE   Sheran Lawless, OT 10/09/2022, 8:04 AM

## 2022-10-09 NOTE — Therapy (Signed)
OUTPATIENT PHYSICAL THERAPY NEURO TREATMENT   Patient Name: Joseph Bright MRN: [id number] DOB:[birth year], 47 y.o., male Today's Date: 10/09/2022   PCP: Noberto Retort, MD REFERRING PROVIDER: Charlton Amor, PA-C  END OF SESSION:  PT End of Session - 10/09/22 0847     Visit Number 4    Number of Visits 17   with eval   Date for PT Re-Evaluation 12/18/22    Authorization Type [employer]    PT Start Time 0846    PT Stop Time 0928    PT Time Calculation (min) 42 min    Equipment Utilized During Treatment Gait belt;Other (comment)   R toe cap   Activity Tolerance Patient tolerated treatment well;Patient limited by fatigue    Behavior During Therapy [facility] for tasks assessed/performed                Past Medical History:  Diagnosis Date   Hypertension    Stroke [facility])    Past Surgical History:  Procedure Laterality Date   IR CT HEAD LTD  08/07/2022   IR INTRA CRAN STENT  08/07/2022   IR PERCUTANEOUS ART THROMBECTOMY/INFUSION INTRACRANIAL INC DIAG ANGIO  07/25/2022   LOOP RECORDER INSERTION N/A 07/28/2022   Procedure: LOOP RECORDER INSERTION;  Surgeon: Regan Lemming, MD;  Location: [facility] INVASIVE CV LAB;  Service: Cardiovascular;  Laterality: N/A;   RADIOLOGY WITH ANESTHESIA N/A 07/25/2022   Procedure: RADIOLOGY WITH ANESTHESIA;  Surgeon: Radiologist, Medication, MD;  Location: [facility] OR;  Service: Radiology;  Laterality: N/A;   TEE WITHOUT CARDIOVERSION N/A 07/28/2022   Procedure: TRANSESOPHAGEAL ECHOCARDIOGRAM;  Surgeon: Parke Poisson, MD;  Location: [facility] INVASIVE CV LAB;  Service: Cardiovascular;  Laterality: N/A;   Patient Active Problem List   Diagnosis Date Noted   Adjustment disorder with mixed anxiety and depressed mood 08/07/2022   Cerebrovascular accident (CVA) due to occlusion of left posterior communicating artery (HCC) 08/01/2022   Acute ischemic left ACA stroke (HCC) 07/25/2022   Arterial ischemic stroke, ACA (anterior cerebral artery), left, acute (HCC)  07/25/2022   DYSLIPIDEMIA 04/24/2007   RENAL FAILURE NOS 11/29/2006   HYPERTENSION, ESSENTIAL NOS 11/12/2006    ONSET DATE: 08/24/2022 (referral date)  REFERRING DIAG: I63.9 (ICD-10-CM) - CVA (cerebral vascular accident) (HCC)  THERAPY DIAG:  Muscle weakness (generalized)  Unsteadiness on feet  Other abnormalities of gait and mobility  Rationale for Evaluation and Treatment: Rehabilitation  SUBJECTIVE:  SUBJECTIVE STATEMENT: Pt reports no acute changes since last visit. Has been watching his BP at home, denies any orthostatic episodes. Tab reports he got into the car by himself yesterday and washed dishes.   Pt accompanied by: self ex wife and caregiver [name] ("Tab")  PERTINENT HISTORY:  hypertension, CKD stage IV followed by outpatient nephrology  PAIN:  Are you having pain? No  PRECAUTIONS: Fall and ICD/Pacemaker  WEIGHT BEARING RESTRICTIONS: No  FALLS: Has patient fallen in last 6 months? Yes. Number of falls 1, fell last weekend at home trying to do something by himself, he bumped his head, his ex-wife helped him to get back up after the fall  LIVING ENVIRONMENT: Lives with:  lives with ex-wife Lives in: House/apartment Stairs: No Has following equipment at home: Environmental consultant - 2 wheeled, Wheelchair (manual), shower chair, and bed side commode  PLOF: Needs assistance with ADLs, Needs assistance with homemaking, Needs assistance with gait, and Needs assistance with transfers  PATIENT GOALS: "to walk"  OBJECTIVE:   DIAGNOSTIC FINDINGS:  Brain MRI 07/26/22 IMPRESSION: 1. Confluent Left ACA territory infarct. Cytotoxic edema with no hemorrhagic transformation.   2. Multiple additional scattered mostly punctate ischemic foci in the left greater than right MCA, PCA, and occasionally  cerebellar artery territories. No significant intracranial mass effect.   3. 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   4. See intracranial MRA is reported separately today.    COGNITION: Overall cognitive status: Within functional limits for tasks assessed   SENSATION: N/T in R hemibody, impaired sensation though light touch appears intact  EDEMA:  Swelling in RLE/R foot  POSTURE: rounded shoulders, forward head, and posterior pelvic tilt   LOWER EXTREMITY MMT:    MMT Right Eval Left Eval  Hip flexion 1 5  Hip extension    Hip abduction    Hip adduction    Hip internal rotation    Hip external rotation    Knee flexion 1 5  Knee extension 1 5  Ankle dorsiflexion 0 5  Ankle plantarflexion    Ankle inversion    Ankle eversion    (Blank rows = not tested)  VITALS  Vitals:   10/09/22 0854 10/09/22 0926  BP: (!) 142/98 (!) 147/92  Pulse: 74 94     TODAY'S TREATMENT:                  TherAct Assessed vitals at beginning, middle and end of session. Diastolic BP initially elevated but was narrowly within limits for therapy. Pt's BP lowered by end of session, no orthostatics today.    NMR In // bars for RLE NMR, improved R hip abduction strength, RLE weightbearing tolerance and gait kinematics:  Fwd and retro gait w/shoe cover on RLE while using mirror for visual biofeedback on body position, x4 reps down and back and CGA. Mod verbal cues to promote abducted foot placement of RLE to reduce vaulting compensation on LLE. Pt demonstrated improved R foot placement w/use of visual cues but relies heavily on L truncal lean and LUE support to progress RLE.   RLE taps to red dot w/BUE support, x6 reps, for improved R foot placement and hip flexion strength. Pt unable to perform without vaulting and quickly fatigued, requiring seated rest break. Assessed BP while seated in LUE: 140/87 mmHg, HR 86 bpm Alt toe tap to red dot w/LLE and BUE  support to work on weightbearing tolerance on RLE, x12 reps. Min cues to reduce L  lateral trunk lean to perform  Alt forward step w/mini lunge to 4" step w/LLE. Pt required mod A for proper positioning of RLE as he began to ER and slide backwards due to shoe cover. Side stepping down length of // bars w/BUE support, x2 each direction, for improved hip abduction strength and R foot placement. Increased difficulty stepping to R side. RPE of 8/10 following activity.   Gait pattern:  L vaulting, step to pattern, step through pattern, decreased step length- Right, decreased stance time- Right, decreased stride length, decreased hip/knee flexion- Right, decreased ankle dorsiflexion- Right, scissoring, lateral hip instability, lateral lean- Left, narrow BOS, and poor foot clearance- Right Distance walked: 12' and 65'  Assistive device utilized: Environmental consultant - 2 wheeled and R AFO Level of assistance: CGA Comments: When walking to // bars, noted significant scissoring of RLE. Added shoe cover when ambulating back to mat w/cues for wider foot placement and pt able to maintain hip-width BOS 90% of time but fatigued very quickly, requiring standing rest break every 3-4 steps. RPE of 9/10 following session.                                                                                                 PATIENT EDUCATION: Education details: continue HEP, monitor BP in afternoons and write down values, working on improved foot placement w/RLE at home.  Person educated: Patient and Caregiver exwife [name] Education method: Explanation Education comprehension: verbalized understanding and needs further education  HOME EXERCISE PROGRAM: Access Code: [email or web address] URL: [email or web address] Date: 09/27/2022 Prepared by: Peter Congo  Exercises - Hooklying Heel Slide  - 1 x daily - 7 x weekly - 2 sets - 10-15 reps - Supine Bridge  - 1 x daily - 7 x weekly - 2-3 sets - 15 reps - Clamshell  - 1 x daily -  7 x weekly - 2 sets - 10 reps - Sidelying Reverse Clamshell  - 1 x daily - 7 x weekly - 2 sets - 10 reps - Long Sitting Hamstring Stretch  - 1 x daily - 7 x weekly - 1 sets - 3-5 reps - 30 sec hold - Staggered Sit-to-Stand  - 1 x daily - 7 x weekly - 3 sets - 5 reps   GOALS: Goals reviewed with patient? Yes  SHORT TERM GOALS: Target date: 10/23/2022  Pt will be independent with initial outpatient HEP for improved strength, balance, transfers and gait. Baseline: Goal status: INITIAL  2.  Pt will improve 5 x STS to less than or equal to 30 seconds to demonstrate improved functional strength and transfer efficiency.  Baseline: 35.69 sec (7/1) Goal status: INITIAL  3.  Pt will improve normal TUG to less than or equal to 55 seconds for improved functional mobility and decreased fall risk. Baseline: 67 sec with RW (7/1) Goal status: INITIAL  4.  Pt will improve gait velocity to at least .90 ft/sec for improved gait efficiency and performance at CGA level  Baseline: 0.67 ft/sec with RW and CGA (7/1) Goal status: INITIAL  5.  Pt will ambulate x 150 ft with LRAD  and CGA to demonstrate improved endurance and increased independence with functional mobility. Baseline: 32.8 ft with RW and CGA Goal status: INITIAL  6.  Pt will initiate stair training as safe and able. Baseline:  Goal status: INITIAL  LONG TERM GOALS: Target date: 11/23/2022   Pt will be independent with final HEP for improved strength, balance, transfers and gait. Baseline:  Goal status: INITIAL  2.  Pt will improve 5 x STS to less than or equal to 25 seconds to demonstrate improved functional strength and transfer efficiency.  Baseline: 35.69 sec (7/1) Goal status: INITIAL  3.  Pt will improve normal TUG to less than or equal to 45 seconds for improved functional mobility and decreased fall risk. Baseline: 67 sec with RW (7/1) Goal status: INITIAL  4.  Pt will improve gait velocity to at least 1.25 ft/sec for  improved gait efficiency and performance at SBA level  Baseline: 0.67 ft/sec with RW and CGA (7/1) Goal status: INITIAL  5.  Pt will ambulate x 300 ft with LRAD and SBA to demonstrate improved endurance and increased independence with functional mobility. Baseline: 32.8 ft with RW and CGA Goal status: INITIAL  6.  Pt will ascend/descend 12 stairs with LRAD and min A to demonstrate increased independence with functional mobility. Baseline: not assessed at eval Goal status: INITIAL  ASSESSMENT:  CLINICAL IMPRESSION: Emphasis of skilled PT session on R foot placement, endurance and hip abduction strength. Pt demonstrates significant vaulting on L to progress RLE and has scissored R foot placement. Spent session addressing R foot placement and pt had good response to use of mirror for visual feedbak on body position. Pt fatigues very quickly and required frequent seated rest breaks throughout session but recovered well and had normal BP response as session progressed. Encouraged pt to work on abducted foot placement of RLE when ambulating at home and provided him with shoe cover. Continue POC.    OBJECTIVE IMPAIRMENTS: Abnormal gait, cardiopulmonary status limiting activity, decreased activity tolerance, decreased balance, decreased coordination, decreased endurance, decreased mobility, difficulty walking, decreased strength, increased edema, impaired sensation, and impaired UE functional use.   ACTIVITY LIMITATIONS: carrying, lifting, bending, standing, squatting, stairs, transfers, bed mobility, bathing, toileting, dressing, reach over head, and hygiene/grooming  PARTICIPATION LIMITATIONS: meal prep, cleaning, laundry, interpersonal relationship, driving, shopping, community activity, and occupation  PERSONAL FACTORS: 1-2 comorbidities:    hypertension, CKD stage IV followed outpatient nephrology are also affecting patient's functional outcome.   REHAB POTENTIAL: Good  CLINICAL DECISION  MAKING: Stable/uncomplicated  EVALUATION COMPLEXITY: Moderate  PLAN:  PT FREQUENCY: 2x/week  PT DURATION: 8 weeks  PLANNED INTERVENTIONS: Therapeutic exercises, Therapeutic activity, Neuromuscular re-education, Balance training, Gait training, Patient/Family education, Self Care, Joint mobilization, Stair training, Vestibular training, Canalith repositioning, Visual/preceptual remediation/compensation, Orthotic/Fit training, DME instructions, Aquatic Therapy, Dry Needling, Wheelchair mobility training, Cryotherapy, Moist heat, Taping, Manual therapy, and Re-evaluation  PLAN FOR NEXT SESSION: check BP/monitor for orthostatic hypotension, how was PCP visit on 7/9? work on RLE NMR and strengthening, gait with RW--needs to work on decreasing vaulting with his LLE when stepping with RLE (could do forward/backward steps at mat or in // bars, sit to stands with step under LLE); has defibrillator so no e-stim   Chaniya Genter E Derrian Rodak, PT, DPT  10/09/2022, 9:31 AM

## 2022-10-10 LAB — CUP PACEART REMOTE DEVICE CHECK
Date Time Interrogation Session: 20240714231213
Implantable Pulse Generator Implant Date: 20240503

## 2022-10-12 ENCOUNTER — Encounter: Payer: Managed Care, Other (non HMO) | Admitting: Occupational Therapy

## 2022-10-12 ENCOUNTER — Ambulatory Visit: Payer: Managed Care, Other (non HMO) | Admitting: Physical Therapy

## 2022-10-16 ENCOUNTER — Ambulatory Visit: Payer: Managed Care, Other (non HMO) | Admitting: Occupational Therapy

## 2022-10-16 ENCOUNTER — Ambulatory Visit: Payer: Managed Care, Other (non HMO) | Admitting: Physical Therapy

## 2022-10-16 ENCOUNTER — Ambulatory Visit: Payer: Managed Care, Other (non HMO) | Admitting: Speech Pathology

## 2022-10-16 ENCOUNTER — Encounter: Payer: Self-pay | Admitting: Occupational Therapy

## 2022-10-16 ENCOUNTER — Encounter: Payer: Self-pay | Admitting: Speech Pathology

## 2022-10-16 VITALS — BP 151/94 | HR 74

## 2022-10-16 DIAGNOSIS — R208 Other disturbances of skin sensation: Secondary | ICD-10-CM

## 2022-10-16 DIAGNOSIS — R278 Other lack of coordination: Secondary | ICD-10-CM

## 2022-10-16 DIAGNOSIS — M6281 Muscle weakness (generalized): Secondary | ICD-10-CM

## 2022-10-16 DIAGNOSIS — R2681 Unsteadiness on feet: Secondary | ICD-10-CM

## 2022-10-16 DIAGNOSIS — R2689 Other abnormalities of gait and mobility: Secondary | ICD-10-CM

## 2022-10-16 DIAGNOSIS — R4701 Aphasia: Secondary | ICD-10-CM

## 2022-10-16 NOTE — Therapy (Signed)
OUTPATIENT PHYSICAL THERAPY NEURO TREATMENT   Patient Name: Joseph Bright MRN: [id number] DOB:31-May-1975, 47 y.o., male Today's Date: 10/16/2022   PCP: Noberto Retort, MD REFERRING PROVIDER: Charlton Amor, PA-C  END OF SESSION:  PT End of Session - 10/16/22 1019     Visit Number 5    Number of Visits 17   with eval   Date for PT Re-Evaluation 12/18/22    Authorization Type [employer]    PT Start Time 1020    PT Stop Time 1100    PT Time Calculation (min) 40 min    Equipment Utilized During Treatment Gait belt;Other (comment)   R toe cap   Activity Tolerance Patient tolerated treatment well;Patient limited by fatigue    Behavior During Therapy [facility] for tasks assessed/performed                 Past Medical History:  Diagnosis Date   Hypertension    Stroke [facility])    Past Surgical History:  Procedure Laterality Date   IR CT HEAD LTD  08/07/2022   IR INTRA CRAN STENT  08/07/2022   IR PERCUTANEOUS ART THROMBECTOMY/INFUSION INTRACRANIAL INC DIAG ANGIO  07/25/2022   LOOP RECORDER INSERTION N/A 07/28/2022   Procedure: LOOP RECORDER INSERTION;  Surgeon: Regan Lemming, MD;  Location: [facility] INVASIVE CV LAB;  Service: Cardiovascular;  Laterality: N/A;   RADIOLOGY WITH ANESTHESIA N/A 07/25/2022   Procedure: RADIOLOGY WITH ANESTHESIA;  Surgeon: Radiologist, Medication, MD;  Location: [facility] OR;  Service: Radiology;  Laterality: N/A;   TEE WITHOUT CARDIOVERSION N/A 07/28/2022   Procedure: TRANSESOPHAGEAL ECHOCARDIOGRAM;  Surgeon: Parke Poisson, MD;  [facility] INVASIVE CV LAB;  Service: Cardiovascular;  Laterality: N/A;   Patient Active Problem List   Diagnosis Date Noted   Adjustment disorder with mixed anxiety and depressed mood 08/07/2022   Cerebrovascular accident (CVA) due to occlusion of left posterior communicating artery (HCC) 08/01/2022   Acute ischemic left ACA stroke (HCC) 07/25/2022   Arterial ischemic stroke, ACA (anterior cerebral artery), left, acute (HCC)  07/25/2022   DYSLIPIDEMIA 04/24/2007   RENAL FAILURE NOS 11/29/2006   HYPERTENSION, ESSENTIAL NOS 11/12/2006    ONSET DATE: 08/24/2022 (referral date)  REFERRING DIAG: I63.9 (ICD-10-CM) - CVA (cerebral vascular accident) (HCC)  THERAPY DIAG:  Other lack of coordination  Muscle weakness (generalized)  Unsteadiness on feet  Other abnormalities of gait and mobility  Rationale for Evaluation and Treatment: Rehabilitation  SUBJECTIVE:  SUBJECTIVE STATEMENT: Pt reports he is doing well today, no acute changes since last visit. No complaints of pain today. Pt reports he worked on side-steps at rail on his patio outside, did it by himself but made sure he had both hands on the rail.  Tab reports that pt was with his family over the weekend so diet may be contributing to elevated BP, Tab and patient to reach out to his PCP after therapy to try and schedule a follow-up appointment to discuss BP.  Pt accompanied by: self ex wife and caregiver [name] ("Tab")  PERTINENT HISTORY:  hypertension, CKD stage IV followed by outpatient nephrology  PAIN:  Are you having pain? No  PRECAUTIONS: Fall and ICD/Pacemaker  WEIGHT BEARING RESTRICTIONS: No  FALLS: Has patient fallen in last 6 months? Yes. Number of falls 1, fell last weekend at home trying to do something by himself, he bumped his head, his ex-wife helped him to get back up after the fall  LIVING ENVIRONMENT: Lives with:  lives with ex-wife Lives in: House/apartment Stairs: No Has following equipment at home: Environmental consultant - 2 wheeled, Wheelchair (manual), shower chair, and bed side commode  PLOF: Needs assistance with ADLs, Needs assistance with homemaking, Needs assistance with gait, and Needs assistance with transfers  PATIENT GOALS: "to  walk"  OBJECTIVE:   DIAGNOSTIC FINDINGS:  Brain MRI 07/26/22 IMPRESSION: 1. Confluent Left ACA territory infarct. Cytotoxic edema with no hemorrhagic transformation.   2. Multiple additional scattered mostly punctate ischemic foci in the left greater than right MCA, PCA, and occasionally cerebellar artery territories. No significant intracranial mass effect.   3. 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   4. See intracranial MRA is reported separately today.    COGNITION: Overall cognitive status: Within functional limits for tasks assessed   SENSATION: N/T in R hemibody, impaired sensation though light touch appears intact  EDEMA:  Swelling in RLE/R foot  POSTURE: rounded shoulders, forward head, and posterior pelvic tilt   LOWER EXTREMITY MMT:    MMT Right Eval Left Eval  Hip flexion 1 5  Hip extension    Hip abduction    Hip adduction    Hip internal rotation    Hip external rotation    Knee flexion 1 5  Knee extension 1 5  Ankle dorsiflexion 0 5  Ankle plantarflexion    Ankle inversion    Ankle eversion    (Blank rows = not tested)  VITALS  Vitals:   10/16/22 1025  BP: (!) 151/94  Pulse: 74      TODAY'S TREATMENT:                  TherAct Assessed vitals at beginning of session, BP remains elevated with within safe range for participation in therapy session this date. Pt and caregiver to reach out to PCP to discuss ongoing hypertension.   NMR In // bars for RLE NMR and improved gait kinematics with BUE support: RLE forward/backward stepping to target, 2 x 10 reps Pt continues to vault with LLE, need some assistance to advance RLE Added in 2" step under LLE with improved performance of RLE clearance but some ongoing vaulting of LLE LLE 4" step taps with focus on R knee control 2 x 10 reps Pt exhibits decreased knee control with onset of fatigue, fatigues quickly   Gait Gait pattern:  vaulting LLE,  decreased hip/knee flexion- Right, decreased ankle dorsiflexion- Right, and poor foot clearance- Right Distance walked:  100 ft, 50 ft, 50 ft Assistive device utilized: Environmental consultant - 2 wheeled; // bars Level of assistance: Min A Comments: decreased vaulting of LLE initially, difficulty advancing RLE with onset of fatigue                                                                                               PATIENT EDUCATION: Education details: continue HEP, continue to monitor BP in afternoons and write down values--reach out to PCP, working on improved foot placement w/RLE at home.  Person educated: Patient and Caregiver ex-wife [name] Education method: Explanation Education comprehension: verbalized understanding and needs further education  HOME EXERCISE PROGRAM: Access Code: [email or web address] URL: [email or web address] Date: 09/27/2022 Prepared by: Peter Congo  Exercises - Hooklying Heel Slide  - 1 x daily - 7 x weekly - 2 sets - 10-15 reps - Supine Bridge  - 1 x daily - 7 x weekly - 2-3 sets - 15 reps - Clamshell  - 1 x daily - 7 x weekly - 2 sets - 10 reps - Sidelying Reverse Clamshell  - 1 x daily - 7 x weekly - 2 sets - 10 reps - Long Sitting Hamstring Stretch  - 1 x daily - 7 x weekly - 1 sets - 3-5 reps - 30 sec hold - Staggered Sit-to-Stand  - 1 x daily - 7 x weekly - 3 sets - 5 reps   GOALS: Goals reviewed with patient? Yes  SHORT TERM GOALS: Target date: 10/23/2022  Pt will be independent with initial outpatient HEP for improved strength, balance, transfers and gait. Baseline: Goal status: INITIAL  2.  Pt will improve 5 x STS to less than or equal to 30 seconds to demonstrate improved functional strength and transfer efficiency.  Baseline: 35.69 sec (7/1) Goal status: INITIAL  3.  Pt will improve normal TUG to less than or equal to 55 seconds for improved functional mobility and decreased fall risk. Baseline: 67 sec with RW (7/1) Goal status:  INITIAL  4.  Pt will improve gait velocity to at least .90 ft/sec for improved gait efficiency and performance at CGA level  Baseline: 0.67 ft/sec with RW and CGA (7/1) Goal status: INITIAL  5.  Pt will ambulate x 150 ft with LRAD and CGA to demonstrate improved endurance and increased independence with functional mobility. Baseline: 32.8 ft with RW and CGA Goal status: INITIAL  6.  Pt will initiate stair training as safe and able. Baseline:  Goal status: INITIAL  LONG TERM GOALS: Target date: 11/23/2022   Pt will be independent with final HEP for improved strength, balance, transfers and gait. Baseline:  Goal status: INITIAL  2.  Pt will improve 5 x STS to less than or equal to 25 seconds to demonstrate improved functional strength and transfer efficiency.  Baseline: 35.69 sec (7/1) Goal status: INITIAL  3.  Pt will improve normal TUG to less than or equal to 45 seconds for improved functional mobility and decreased fall risk. Baseline: 67 sec with RW (7/1) Goal status: INITIAL  4.  Pt will improve gait velocity to at least 1.25 ft/sec for improved  gait efficiency and performance at SBA level  Baseline: 0.67 ft/sec with RW and CGA (7/1) Goal status: INITIAL  5.  Pt will ambulate x 300 ft with LRAD and SBA to demonstrate improved endurance and increased independence with functional mobility. Baseline: 32.8 ft with RW and CGA Goal status: INITIAL  6.  Pt will ascend/descend 12 stairs with LRAD and min A to demonstrate increased independence with functional mobility. Baseline: not assessed at eval Goal status: INITIAL  ASSESSMENT:  CLINICAL IMPRESSION: Emphasis of skilled PT session on continuing to work on improving gait mechanics and RLE NMR. Pt continues to exhibit vaulting with his LLE during gait though it is improved from previous sessions until he becomes fatigued. Additionally, pt exhibits improved ability to advance his RLE during gait until he becomes fatigued. Pt  continues to benefit from skilled therapy services to work towards increasing his safety and independence with functional mobility and decreasing his fall risk. Continue POC.    OBJECTIVE IMPAIRMENTS: Abnormal gait, cardiopulmonary status limiting activity, decreased activity tolerance, decreased balance, decreased coordination, decreased endurance, decreased mobility, difficulty walking, decreased strength, increased edema, impaired sensation, and impaired UE functional use.   ACTIVITY LIMITATIONS: carrying, lifting, bending, standing, squatting, stairs, transfers, bed mobility, bathing, toileting, dressing, reach over head, and hygiene/grooming  PARTICIPATION LIMITATIONS: meal prep, cleaning, laundry, interpersonal relationship, driving, shopping, community activity, and occupation  PERSONAL FACTORS: 1-2 comorbidities:    hypertension, CKD stage IV followed outpatient nephrology are also affecting patient's functional outcome.   REHAB POTENTIAL: Good  CLINICAL DECISION MAKING: Stable/uncomplicated  EVALUATION COMPLEXITY: Moderate  PLAN:  PT FREQUENCY: 2x/week  PT DURATION: 8 weeks  PLANNED INTERVENTIONS: Therapeutic exercises, Therapeutic activity, Neuromuscular re-education, Balance training, Gait training, Patient/Family education, Self Care, Joint mobilization, Stair training, Vestibular training, Canalith repositioning, Visual/preceptual remediation/compensation, Orthotic/Fit training, DME instructions, Aquatic Therapy, Dry Needling, Wheelchair mobility training, Cryotherapy, Moist heat, Taping, Manual therapy, and Re-evaluation  PLAN FOR NEXT SESSION: check BP, did he schedule with PCP about his BP? work on RLE NMR and strengthening, gait with RW--needs to work on decreasing vaulting with his LLE when stepping with RLE (could do forward/backward steps at mat or in // bars, sit to stands with step under LLE); has defibrillator so no e-stim    Peter Congo, PT, DPT,  CSRS   10/16/2022, 11:01 AM

## 2022-10-16 NOTE — Therapy (Signed)
OUTPATIENT SPEECH LANGUAGE PATHOLOGY TREATMENT NOTE   Patient Name: Joseph Bright MRN: [id number] DOB:[birth year], 47 y.o., male Today's Date: 10/16/2022  PCP: Noberto Retort, MD REFERRING PROVIDER: Charlton Amor, PA-C  END OF SESSION:   End of Session - 10/16/22 1106     Visit Number 5    Number of Visits 25    Date for SLP Re-Evaluation 12/18/22    Authorization Type cigna medicaid - 60 combined - each discipline counts as 1 visit -  physical is his primary concern    SLP Start Time 1100    SLP Stop Time  1145    SLP Time Calculation (min) 45 min    Activity Tolerance Patient tolerated treatment well              Past Medical History:  Diagnosis Date   Hypertension    Stroke [facility])    Past Surgical History:  Procedure Laterality Date   IR CT HEAD LTD  08/07/2022   IR INTRA CRAN STENT  08/07/2022   IR PERCUTANEOUS ART THROMBECTOMY/INFUSION INTRACRANIAL INC DIAG ANGIO  07/25/2022   LOOP RECORDER INSERTION N/A 07/28/2022   Procedure: LOOP RECORDER INSERTION;  Surgeon: Regan Lemming, MD;  Location: [facility] INVASIVE CV LAB;  Service: Cardiovascular;  Laterality: N/A;   RADIOLOGY WITH ANESTHESIA N/A 07/25/2022   Procedure: RADIOLOGY WITH ANESTHESIA;  Surgeon: Radiologist, Medication, MD;  Location: [facility] OR;  Service: Radiology;  Laterality: N/A;   TEE WITHOUT CARDIOVERSION N/A 07/28/2022   Procedure: TRANSESOPHAGEAL ECHOCARDIOGRAM;  Surgeon: Parke Poisson, MD;  Location: [facility] INVASIVE CV LAB;  Service: Cardiovascular;  Laterality: N/A;   Patient Active Problem List   Diagnosis Date Noted   Adjustment disorder with mixed anxiety and depressed mood 08/07/2022   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 No, ex-spouse, [name] present  OBJECTIVE:   TODAY'S TREATMENT:                                                                                                                                         DATE:   10/16/22: Targeted complex naming task with given 1st letter - Tyrese named 12 items with supervision cues and min extended time. He named 16 items in personally relevant category with supervision cues. In complex conversation generating pros and cons to paying student athletes, means testing for  social security Ward used compensations for word finding with rare min questioning cues. He reports success using verbal compensations with friends and family and is using these consistently. [name] endorsed he used compensations this morning to successfully communicate his weekend activities. In complex conversation, Bashar generated complex sentences 6/15 utterances, with extended time.   10/04/22: Led through VNeST with occasional min-A to elaborate and challenge patient to generate more complex vocabulary words. Patient required prompting questions to elaborate on verbs generated while aligning with personal relations. ST transitioned to semantic feature analysis to target word finding. Patient required occasional min-A to find words and was prompted to describe the words. Twice patient benefited from phonemic cues. Ongoing, consistant verbal cues to use anomia strategies required to A in their use when anomia in conversation or structured task occurred.   10/02/22: To target word finding, complex sentence generation  Research scientist (physical sciences) (VNeST) was utilized. The pt generated 3 subjects and objects for 4 verbs (build, chase, chase, climb curl), for a total of 12 subject objects. Pt required usual min semantic cues for 3rd subject/object for each verb cues. Pt generated 4 complex sentences by answering "wh" questions. Pt required rare min cues to generate complex sentences. Frequent extended time due to word finding. During VNeST, Braelen required verbal cues to use compensations for word finding - verbal cues to use gestures as well. Tab trained to accept gesture cues as well as verbal cues. Semantic Feature Analysis (SFA) completed to target word finding and verbal compensations for aphasia - with usual min to mod questioning and semantic cues, Waddell complete SFA chart for volcano, dominoes, dice (to generate salient descriptions). In moderately complex conversation re: legalized betting, Mancel required frequent verbal cues to use compensations, semantic cues for word finding. Frequent halting/fillers over 8 minute conversation.    09/27/22: Caregiver, ex-spouse present. Educated her re: impaired auditory comprehension and need for caregiver to provide A with medical, legal and insurance interactions. Trained pt and caregiver on compensations for auditory comprehension. See patient instructions from eval.  To target complex sentence generation and word finding,  Scientist, product/process development (VNeST) was utilized. The pt generated 3 subjects and objects for 4 verbs, (throw, wash, measure, decorate) for a total of 12 subject objects. Pt required occasional to usual semantic cues. Pt generated 4 complex sentences by answering "wh" questions. Pt required occasional min A cues to generate complex sentences. Targeted sentence generation generating sentences for multiple meaning words with usual cues to used varied subjects and to generate sentence rather than definition. Tabius generated 10 multiple meaning  sentences with frequent extended time for word finding, however with initial cues to limit fillers, he did not use fillers 80% of time. Trained in verbal compensations for word finding generating 3 sentence descriptions of 10 basic objects with usual extended time and occasional questioning cues. Caregiver aware to encourage/cue Emrah to use verbal compensations at home.    PATIENT EDUCATION: Education details: See Today's Treatment; aphasia ed, compensatory strategies, see patient instructions Person educated: Patient Education method: Explanation, Demonstration, Verbal cues, and Handouts Education comprehension: verbal cues required and needs further education     GOALS: Goals reviewed with patient? Yes   SHORT TERM GOALS: Target date: 10/23/22   Pt will name 15 items in personally relevant category with rare min A Baseline: 9 items Goal status: MET   2.  Pt will use verbal compensations 4/5 word finding episodes with rare min A Baseline: 1/5 Goal status: MET  3.  Pt will generate complex sentences in structured tasks 4/5x with occasional min A Baseline: no complex sentences Goal status: ONGOING   4.  Pt will carry over 3 strategies to compensate for auditory comprehension Baseline: No strategies Goal status: ONGOING   LONG TERM GOALS: Target date: 12/18/22   Pt will use verbal compensations for aphasia 5/5x in conversation with rare min A Baseline:  Goal status: INITIAL   2.  Pt will generate complex sentences in simple conversation 4/5 turns with occasional min A Baseline: simple sentences only Goal status: INITIAL   3.  Pt will use compensatory strategies to gain a turn in group or fast moving conversation 3x outside of therapy Baseline: no strategy for this Goal status: INITIAL   4.  Pt will reduce fillers of "um" and "ah" to 5 or less over 5 sentences Baseline: 15+ fillers over 3 sentence narrative Goal status: INITIAL   ASSESSMENT:   CLINICAL  IMPRESSION: Patient is a 47 y.o. male who was seen today for moderate aphasia characterized by halting, fillers due to anomia. Emma continues to improve with word finding in conversation as well as structured naming tasks. He is using verbal compensations for aphasia successfully and reports these strategies are also improving his word finding. Aphasia improved to mild to moderate. Auditory comprehension continues to improve for moderately complex conversation, however he continues to benefit from family being present to manage medical, legal and financial information/conversations. I recommend skilled ST to maximize communication for safety, independence, and return to PLOF.    OBJECTIVE IMPAIRMENTS: include aphasia. These impairments are limiting patient from return to work, ADLs/IADLs, and effectively communicating at home and in community. Factors affecting potential to achieve goals and functional outcome are financial resources. Patient will benefit from skilled SLP services to address above impairments and improve overall function.   REHAB POTENTIAL: Good   PLAN:   SLP FREQUENCY: 2x/week   SLP DURATION: 12 weeks   PLANNED INTERVENTIONS: Language facilitation, Environmental controls, Cueing hierachy, Cognitive reorganization, Internal/external aids, Functional tasks, Multimodal communication approach, and SLP instruction and feedback       Concepcion Gillott, Radene Journey, CCC-SLP 10/16/2022, 12:02 PM

## 2022-10-16 NOTE — Therapy (Signed)
OUTPATIENT OCCUPATIONAL THERAPY NEURO TREATMENT  Patient Name: Joseph Bright MRN: [id number] DOB:1975-05-10, 47 y.o., male Today's Date: 10/16/2022  PCP: Noberto Retort, MD  REFERRING PROVIDER: Charlton Amor, PA-C   END OF SESSION:  OT End of Session - 10/16/22 0926     Visit Number 5    Number of Visits 17    Date for OT Re-Evaluation 11/20/22   Date adjusted as pt was not wanting to schedue visits the week of 09/18/2022   Authorization Type [employer] (60 VL - OT, PT, ST)    OT Start Time 0925    OT Stop Time 1010    OT Time Calculation (min) 45 min    Activity Tolerance Patient tolerated treatment well    Behavior During Therapy [facility] for tasks assessed/performed             Past Medical History:  Diagnosis Date   Hypertension    Stroke [facility])    Past Surgical History:  Procedure Laterality Date   IR CT HEAD LTD  08/07/2022   IR INTRA CRAN STENT  08/07/2022   IR PERCUTANEOUS ART THROMBECTOMY/INFUSION INTRACRANIAL INC DIAG ANGIO  07/25/2022   LOOP RECORDER INSERTION N/A 07/28/2022   Procedure: LOOP RECORDER INSERTION;  Surgeon: Regan Lemming, MD;  Location: [facility] INVASIVE CV LAB;  Service: Cardiovascular;  Laterality: N/A;   RADIOLOGY WITH ANESTHESIA N/A 07/25/2022   Procedure: RADIOLOGY WITH ANESTHESIA;  Surgeon: Radiologist, Medication, MD;  Location: [facility] OR;  Service: Radiology;  Laterality: N/A;   TEE WITHOUT CARDIOVERSION N/A 07/28/2022   Procedure: TRANSESOPHAGEAL ECHOCARDIOGRAM;  Surgeon: Parke Poisson, MD;  Location: [facility] INVASIVE CV LAB;  Service: Cardiovascular;  Laterality: N/A;   Patient Active Problem List   Diagnosis Date Noted   Adjustment disorder with mixed anxiety and depressed mood 08/07/2022   Cerebrovascular accident (CVA) due to occlusion of left posterior communicating artery (HCC) 08/01/2022   Acute ischemic left ACA stroke (HCC) 07/25/2022   Arterial ischemic stroke, ACA (anterior cerebral artery), left, acute (HCC) 07/25/2022    DYSLIPIDEMIA 04/24/2007   RENAL FAILURE NOS 11/29/2006   HYPERTENSION, ESSENTIAL NOS 11/12/2006    ONSET DATE: 07/25/2022  REFERRING DIAG: I61.9 (ICD-10-CM) - CVA (cerebrovascular accident due to intracerebral hemorrhage)   THERAPY DIAG:  Other lack of coordination  Muscle weakness (generalized)  Unsteadiness on feet  Other disturbances of skin sensation  Rationale for Evaluation and Treatment: Rehabilitation  SUBJECTIVE:   SUBJECTIVE STATEMENT: No pain and no new falls reported  Pt accompanied by: significant other - [name]  PERTINENT HISTORY: Pt presented to ED 07/25/2022. "Cranial CT scan negative for acute changes. There was chronic appearing right thalamic lacunar infarct. 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 Status post TNK with interventional radiology for stenting. MRI follow-up showed confluent left ACA territory infarction. Cytotoxic edema with no hemorrhagic transformation. Multiple additional scattered mostly punctate ischemic foci in the left greater than right MCA, PCA and occasionally cerebellar artery territories."  Pt in IRF from 08/01/2022 -08/31/2022 then discharged to OP OT, PT, and ST.   PRECAUTIONS: Fall  WEIGHT BEARING RESTRICTIONS: No  PAIN:  Are you having pain? Yes - Rt shoulder 0-3/10 w/ higher level reaching only  FALLS: Has patient fallen in last 6 months? No  LIVING ENVIRONMENT: Lives with: lives with their family Lives in: House/apartment Stairs: No Has following equipment at home: Dan Humphreys - 2 wheeled, Environmental consultant - 4 wheeled, Wheelchair (manual), bed side commode, and Grab bars  PLOF: Independent; full-time working for [employer]; driving; sports (football and basketball)  PATIENT GOALS: return to PLOF  OBJECTIVE:   HAND DOMINANCE: Right  ADLs: Overall ADLs: min A  IADLs: Shopping: dependent Light housekeeping: dependent Community mobility: dependent  Handwriting: Not legible  MOBILITY  STATUS: Needs Assist: manual wheelchair  ACTIVITY TOLERANCE: Activity tolerance: fair  FUNCTIONAL OUTCOME MEASURES: FOTO: Slight: 59   UPPER EXTREMITY ROM:     AROM Right (eval) Left (eval)  Shoulder flexion WNL WNL  Shoulder abduction WNL WNL  Elbow flexion WNL WNL  Elbow extension WNL WNL  Wrist flexion WNL WNL  Wrist extension WNL WNL  Wrist pronation WNL WNL  Wrist supination WNL WNL   Digit Composite Flexion WNL WNL  Digit Composite Extension WNL WNL  Digit Opposition WNL WNL  (Blank rows = not tested)  RUE: IR to hip; overall bradykinesia with tremor  UPPER EXTREMITY MMT:     MMT Right (eval) Left (eval)  Shoulder flexion 4/5 WNL  Shoulder abduction 4/5 WNL  Elbow flexion 4/5 WNL  Elbow extension 4/5 WNL  (Blank rows = not tested)  HAND FUNCTION: Grip strength: Right: 38.8 lbs; Left: 82.8 lbs  COORDINATION: 9 Hole Peg test: Right: 139 sec; Left: 32 sec  SENSATION: Paresthesias reported in R fingertips  EDEMA: none reported or observed  MUSCLE TONE: WFL  COGNITION: Overall cognitive status: Within functional limits for tasks assessed  VISION: Subjective report: no changes Baseline vision: Wears glasses for reading only Visual history:  none  VISION ASSESSMENT: WFL  PERCEPTION: WFL  PRAXIS: WFL  OBSERVATIONS: Pt appears well-kept. He is able to propel w/c with increased time. Has RW with him.   TODAY'S TREATMENT:                                                                                                                               Standing at table to place resistive clothespins (green and blue resistance) on antenna RUE with cues to shift weight over Rt LE. Pt then removing clothespins RUE  Pt copying peg design with medium sized pegs Rt hand for coordination and visual perceptual skills. Pt manipulating up to 3 pegs in hand for fingertip to/from palm translation with max difficulty and drops. Modified activity to picking up just  one peg on opposite end and turning to correct end for placement  UBE x 7 min, level 1 for normal reciprocal movement pattern and UB conditioning. Pt with compensations at wrist and shoulder going forward, but pt able to do much better going backwards. Pt was instructed to do remaining of time backwards     PATIENT EDUCATION: Education details: shoulder girdle/scapula strengthening HEP  Person educated: Patient and Spouse Education method: Explanation, Demonstration, Tactile cues, Verbal cues, and Handouts Education comprehension: verbalized understanding, returned demonstration, verbal cues required, and needs further education  HOME EXERCISE PROGRAM: 09/12/2022: Chilton Si putty and coordination HEPs 09/27/22: Additional coordination HEP  10/04/22: shoulder girdle/scapula  strengthening HEP    GOALS:  SHORT TERM GOALS: Target date: 10/10/2022    Patient will demonstrate independence with initial RUE HEP. Baseline: Goal status: MET  2.  Patient will demonstrate at least 45 lbs R grip strength as needed to open jars and other containers. Baseline: 38.8 lbs Goal status: IN PROGRESS  3.  Pt will report no difficulty holding a dish while washing it.  Baseline: with some difficulty Goal status: INITIAL   LONG TERM GOALS: Target date: 11/17/2022    Patient will demonstrate updated RUE HEP with 25% verbal cues or less for proper execution. Baseline:  Goal status: IN PROGRESS  2.  Patient will demonstrate at least 50 lbs R grip strength as needed to open jars and other containers. Baseline: 38.8 lbs Goal status: IN PROGRESS  3.  Patient will complete nine-hole peg with use of R in 60 seconds or less. Baseline: 139 seconds Goal status: IN PROGRESS  4.  Pt will complete d/c FOTO.  Baseline:  Goal status: INITIAL   ASSESSMENT:  CLINICAL IMPRESSION: Patient with improved strength posterior sh girdle and reports shoulder feeling better. Pt still struggles with in hand  manipulation. ? Proprioceptive deficits as well.   PERFORMANCE DEFICITS: in functional skills including ADLs, IADLs, coordination, dexterity, strength, Fine motor control, Gross motor control, mobility, endurance, and UE functional use  IMPAIRMENTS: are limiting patient from ADLs, IADLs, work, and leisure.   CO-MORBIDITIES: may have co-morbidities  that affects occupational performance. Patient will benefit from skilled OT to address above impairments and improve overall function.  MODIFICATION OR ASSISTANCE TO COMPLETE EVALUATION: No modification of tasks or assist necessary to complete an evaluation.  OT OCCUPATIONAL PROFILE AND HISTORY: Problem focused assessment: Including review of records relating to presenting problem.  CLINICAL DECISION MAKING: LOW - limited treatment options, no task modification necessary  REHAB POTENTIAL: Good  EVALUATION COMPLEXITY: Low    PLAN:  OT FREQUENCY: 2x/week  OT DURATION: 8 weeks  PLANNED INTERVENTIONS: self care/ADL training, therapeutic exercise, therapeutic activity, neuromuscular re-education, manual therapy, functional mobility training, splinting, ultrasound, fluidotherapy, patient/family education, DME and/or AE instructions, and Re-evaluation  RECOMMENDED OTHER SERVICES: Receiving PT and ST  CONSULTED AND AGREED WITH PLAN OF CARE: Patient and family member/caregiver  PLAN FOR NEXT SESSION: continue in hand manipulation, standing activity, UBE backwards   Sheran Lawless, OT 10/16/2022, 9:27 AM

## 2022-10-18 ENCOUNTER — Ambulatory Visit (INDEPENDENT_AMBULATORY_CARE_PROVIDER_SITE_OTHER): Payer: Managed Care, Other (non HMO) | Admitting: Primary Care

## 2022-10-18 ENCOUNTER — Encounter: Payer: Self-pay | Admitting: Primary Care

## 2022-10-18 VITALS — BP 116/82 | HR 76 | Temp 98.9°F | Ht 68.0 in | Wt 211.0 lb

## 2022-10-18 DIAGNOSIS — I63522 Cerebral infarction due to unspecified occlusion or stenosis of left anterior cerebral artery: Secondary | ICD-10-CM

## 2022-10-18 DIAGNOSIS — I6329 Cerebral infarction due to unspecified occlusion or stenosis of other precerebral arteries: Secondary | ICD-10-CM

## 2022-10-18 DIAGNOSIS — R0681 Apnea, not elsewhere classified: Secondary | ICD-10-CM | POA: Diagnosis not present

## 2022-10-18 DIAGNOSIS — R569 Unspecified convulsions: Secondary | ICD-10-CM

## 2022-10-18 DIAGNOSIS — R0683 Snoring: Secondary | ICD-10-CM | POA: Diagnosis not present

## 2022-10-18 DIAGNOSIS — I10 Essential (primary) hypertension: Secondary | ICD-10-CM

## 2022-10-18 NOTE — Progress Notes (Signed)
@Patient  ID: Joseph Bright, male    DOB: 08/24/1975, 47 y.o.   MRN: [id number]  Chief Complaint  Patient presents with   Consult    Referring provider: Charlton Amor, PA-C  HPI: 47 year old male, former smoker. PMH significant for stroke, hypertension, renal failure, dyslipidemia, hyperlipidemia.   10/18/2022 Patient presents today for sleep consult.   Patient was admitted from 07/25/2022 through 08/02/2022 for left anterior cerebral artery infarction.  Patient received TNK and then interventional radiologist put a stent that reoccluded and required repeat stenting.  He had a seizure-like episode and was placed on Keppra for 1 day due to chronic kidney disease stage V.  EEG did not show seizure activity.  Echo showed no embolic site, EF 70 to 75%.  Patient was placed on aspirin and Brilinta 90 mg twice daily.  TEE was negative for PFO.  He subsequently went to rehab from 08/02/2022 through 08/31/2022.  Patient presents today for sleep consult.  Companied by his wife.  He has lost 20 to 30 pounds since having a stroke in April.  Prior to this he had symptoms of loud snoring and witnessed apnea.  This has improved with weight loss.  Typical bedtime is between 8 and 9 PM.  He does not take sleep aid.  It can take him 1 to 2 hours to fall asleep.  He starts his day between 7 and 8 AM.  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  No prior sleep studies.  He is not on CPAP or oxygen.  Epworth score 6.  No concern for narcolepsy, cataplexy or sleepwalking.  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  Past Medical History:  Diagnosis Date   Hypertension    Stroke (HCC)     Tobacco History: Social History   Tobacco Use  Smoking Status Former   Current packs/day: 0.00   Types: Cigarettes   Start date: 04/17/1995   Quit date: 04/16/2000   Years since quitting: 22.5  Smokeless Tobacco Never   Counseling given: Not Answered   Outpatient Medications Prior to Visit  Medication Sig Dispense Refill   acetaminophen (TYLENOL) 325 MG tablet      amLODipine (NORVASC) 10 MG tablet 0.5 tablet Orally Once a day for 90 days     aspirin 81 MG chewable tablet 1 tablet Orally Once a day     hydrALAZINE (APRESOLINE) 50 MG tablet TAKE 1 TABLET BY MOUTH TWICE DAILY for 90 days     isosorbide mononitrate (IMDUR) 60 MG 24 hr tablet      labetalol (NORMODYNE) 200 MG tablet TAKE 1 TABLET BY MOUTH TWICE DAILY for 90 days     levETIRAcetam (KEPPRA XR) 500 MG 24 hr tablet 1 tablet at 10 pm Orally Once a day     rosuvastatin (CRESTOR) 20 MG tablet      sevelamer carbonate (RENVELA) 800 MG tablet Take 800 mg by mouth 3 (three) times daily.     simvastatin (ZOCOR)  20 MG tablet TAKE 1 TABLET BY MOUTH ONCE DAILY IN THE EVENING for 90 days     sodium bicarbonate 650 MG tablet 2 tablets Orally twice a day     ticagrelor (BRILINTA) 90 MG TABS tablet Take 90 mg by mouth 2 (two) times daily.     sevelamer (RENAGEL) 800 MG tablet 1 tablet with meals Orally Three times a day     ticagrelor (BRILINTA) 90 MG TABS tablet 1 tablet Orally Twice a day     No facility-administered medications prior to visit.   Review of Systems  Review of Systems  Constitutional: Negative.   HENT: Negative.    Respiratory: Negative.    Cardiovascular: Negative.   Neurological:  Positive for weakness.  Psychiatric/Behavioral:  Positive for sleep disturbance.    Physical Exam  BP 116/82 (BP Location: Right Arm, Patient  Position: Sitting, Cuff Size: Large)   Pulse 76   Temp 98.9 F (37.2 C) (Oral)   Ht 5\' 8"  (1.727 m)   Wt 211 lb (95.7 kg)   SpO2 99%   BMI 32.08 kg/m  Physical Exam Constitutional:      Appearance: Normal appearance.  HENT:     Head: Normocephalic and atraumatic.     Mouth/Throat:     Mouth: Mucous membranes are moist.     Pharynx: Oropharynx is clear.  Cardiovascular:     Rate and Rhythm: Normal rate and regular rhythm.  Pulmonary:     Effort: Pulmonary effort is normal.     Breath sounds: Normal breath sounds. No wheezing, rhonchi or rales.  Musculoskeletal:     Comments: In WC, right side weakness   Skin:    General: Skin is warm and dry.  Neurological:     General: No focal deficit present.     Mental Status: He is alert and oriented to person, place, and time. Mental status is at baseline.  Psychiatric:        Mood and Affect: Mood normal.        Behavior: Behavior normal.        Thought Content: Thought content normal.        Judgment: Judgment normal.      Lab Results:  CBC    Component Value Date/Time   WBC 5.4 08/28/2022 0728   RBC 4.14 (L) 08/28/2022 0728   HGB 12.7 (L) 08/28/2022 0728   HCT 38.6 (L) 08/28/2022 0728   PLT 182 08/28/2022 0728   MCV 93.2 08/28/2022 0728   MCH 30.7 08/28/2022 0728   MCHC 32.9 08/28/2022 0728   RDW 11.9 08/28/2022 0728   LYMPHSABS 1.1 08/02/2022 0504   MONOABS 1.2 (H) 08/02/2022 0504   EOSABS 0.6 (H) 08/02/2022 0504   BASOSABS 0.1 08/02/2022 0504    BMET    Component Value Date/Time   NA 140 08/28/2022 0728   K 4.3 08/28/2022 0728   CL 110 08/28/2022 0728   CO2 22 08/28/2022 0728   GLUCOSE 108 (H) 08/28/2022 0728   BUN 78 (H) 08/28/2022 0728   CREATININE 3.66 (H) 08/28/2022 0728   CALCIUM 9.3 08/28/2022 0728   CALCIUM 8.9 11/20/2006 1735   GFRNONAA 20 (L) 08/28/2022 0728   GFRAA (L) 11/23/2006 0522    19        The eGFR has been calculated using the MDRD equation. This calculation has not been validated  in all clinical    BNP No results found for: "BNP"  ProBNP No results found for: "PROBNP"  Imaging: CUP  PACEART REMOTE DEVICE CHECK  Result Date: 10/10/2022 ILR summary report received. Battery status OK. Normal device function. No new symptom, tachy, brady, or pause episodes. No new AF episodes. Monthly summary reports and ROV/PRN LA, CVRS    Assessment & Plan:   Loud snoring - Patient has symptoms of loud snoring and witnessed apnea.  Recently had a stroke in April 2024 with possible seizure-like activity.  He has since lost 20 to 30 pounds.  Concern patient could have underlying obstructive sleep apnea, needs in-lab sleep study to evaluate.  We reviewed risks of untreated sleep apnea including cardiac arrhythmias/A-fib, stroke, pulmonary hypertension and diabetes.  We discussed treatment options including weight loss, oral appliance, CPAP therapy or referral to ENT for possible surgical options.  If patient does have a diagnosis of sleep apnea would lean more towards CPAP therapy due to his previous medical history.  Encouraged side sleeping position and weight loss efforts.  Advised against driving if experiencing excessive daytime sleepiness fatigue.  Follow-up 1 to 2 weeks after sleep study to review results and treatment options if needed.  Arterial ischemic stroke, ACA (anterior cerebral artery), left, acute (HCC) - Continue ASA and Brilinta  DYSLIPIDEMIA - Continue Crestor as prescribed  Essential hypertension - Stable; BP 116/82 - Continue Amlodipine, Imdur and hydralazine as prescribed    Glenford Bayley, NP 10/18/2022

## 2022-10-18 NOTE — Assessment & Plan Note (Signed)
-   Continue ASA and Brilinta

## 2022-10-18 NOTE — Assessment & Plan Note (Signed)
-   Patient has symptoms of loud snoring and witnessed apnea.  Recently had a stroke in April 2024 with possible seizure-like activity.  He has since lost 20 to 30 pounds.  Concern patient could have underlying obstructive sleep apnea, needs in-lab sleep study to evaluate.  We reviewed risks of untreated sleep apnea including cardiac arrhythmias/A-fib, stroke, pulmonary hypertension and diabetes.  We discussed treatment options including weight loss, oral appliance, CPAP therapy or referral to ENT for possible surgical options.  If patient does have a diagnosis of sleep apnea would lean more towards CPAP therapy due to his previous medical history.  Encouraged side sleeping position and weight loss efforts.  Advised against driving if experiencing excessive daytime sleepiness fatigue.  Follow-up 1 to 2 weeks after sleep study to review results and treatment options if needed.

## 2022-10-18 NOTE — Patient Instructions (Addendum)
  Sleep apnea is defined as period of 10 seconds or longer when you stop breathing at night. This can happen multiple times a night. Dx sleep apnea is when this occurs more than 5 times an hour.    Mild OSA 5-15 apneic events an hour Moderate OSA 15-30 apneic events an hour Severe OSA > 30 apneic events an hour   Untreated sleep apnea puts you at higher risk for cardiac arrhythmias, pulmonary HTN, stroke and diabetes  Treatment options include weight loss, side sleeping position, oral appliance, CPAP therapy or referral to ENT for possible surgical options    Recommendations: Focus on side sleeping position or elevate head with wedge pillow 30 degrees Work on weight loss efforts if able  Do not drive if experiencing excessive daytime sleepiness of fatigue    Orders: Polysomnography (or in-lab sleep study) NU:UVOZ snoring/CVA   Follow-up: Please call to schedule follow-up 1-2 weeks after completing home sleep study to review results and treatment if needed (can be virtual)

## 2022-10-18 NOTE — Progress Notes (Signed)
Reviewed and agree with assessment/plan.   Coralyn Helling, MD [facility] Pulmonary/Critical Care 10/18/2022, 10:50 AM Pager:  [phone]

## 2022-10-18 NOTE — Assessment & Plan Note (Signed)
Continue Crestor as prescribed

## 2022-10-18 NOTE — Assessment & Plan Note (Signed)
-   Stable; BP 116/82 - Continue Amlodipine, Imdur and hydralazine as prescribed

## 2022-10-19 ENCOUNTER — Ambulatory Visit: Payer: Managed Care, Other (non HMO) | Admitting: Occupational Therapy

## 2022-10-19 ENCOUNTER — Encounter: Payer: Self-pay | Admitting: Occupational Therapy

## 2022-10-19 ENCOUNTER — Ambulatory Visit: Payer: Managed Care, Other (non HMO) | Admitting: Physical Therapy

## 2022-10-19 ENCOUNTER — Ambulatory Visit: Payer: Managed Care, Other (non HMO) | Admitting: Speech Pathology

## 2022-10-19 DIAGNOSIS — R208 Other disturbances of skin sensation: Secondary | ICD-10-CM

## 2022-10-19 DIAGNOSIS — R278 Other lack of coordination: Secondary | ICD-10-CM

## 2022-10-19 DIAGNOSIS — R2689 Other abnormalities of gait and mobility: Secondary | ICD-10-CM

## 2022-10-19 DIAGNOSIS — R2681 Unsteadiness on feet: Secondary | ICD-10-CM

## 2022-10-19 DIAGNOSIS — M6281 Muscle weakness (generalized): Secondary | ICD-10-CM

## 2022-10-19 DIAGNOSIS — R4701 Aphasia: Secondary | ICD-10-CM

## 2022-10-19 NOTE — Therapy (Signed)
OUTPATIENT OCCUPATIONAL THERAPY NEURO TREATMENT  Patient Name: Joseph Bright MRN: [id number] DOB:22-Mar-1976, 47 y.o., male Today's Date: 10/19/2022  PCP: Noberto Retort, MD  REFERRING PROVIDER: Charlton Amor, PA-C   END OF SESSION:  OT End of Session - 10/19/22 0847     Visit Number 6    Number of Visits 17    Date for OT Re-Evaluation 11/20/22   Date adjusted as pt was not wanting to schedue visits the week of 09/18/2022   Authorization Type [employer] (60 VL - OT, PT, ST)    OT Start Time 0845    OT Stop Time 0930    OT Time Calculation (min) 45 min    Activity Tolerance Patient tolerated treatment well    Behavior During Therapy [facility] for tasks assessed/performed             Past Medical History:  Diagnosis Date   Hypertension    Stroke [facility])    Past Surgical History:  Procedure Laterality Date   IR CT HEAD LTD  08/07/2022   IR INTRA CRAN STENT  08/07/2022   IR PERCUTANEOUS ART THROMBECTOMY/INFUSION INTRACRANIAL INC DIAG ANGIO  07/25/2022   LOOP RECORDER INSERTION N/A 07/28/2022   Procedure: LOOP RECORDER INSERTION;  Surgeon: Regan Lemming, MD;  Location: [facility] INVASIVE CV LAB;  Service: Cardiovascular;  Laterality: N/A;   RADIOLOGY WITH ANESTHESIA N/A 07/25/2022   Procedure: RADIOLOGY WITH ANESTHESIA;  Surgeon: Radiologist, Medication, MD;  Location: [facility] OR;  Service: Radiology;  Laterality: N/A;   TEE WITHOUT CARDIOVERSION N/A 07/28/2022   Procedure: TRANSESOPHAGEAL ECHOCARDIOGRAM;  Surgeon: Parke Poisson, MD;  Location: [facility] INVASIVE CV LAB;  Service: Cardiovascular;  Laterality: N/A;   Patient Active Problem List   Diagnosis Date Noted   Loud snoring 10/18/2022   Adjustment disorder with mixed anxiety and depressed mood 08/07/2022   Cerebrovascular accident (CVA) due to occlusion of left posterior communicating artery (HCC) 08/01/2022   Acute ischemic left ACA stroke (HCC) 07/25/2022   Arterial ischemic stroke, ACA (anterior cerebral artery), left, acute  (HCC) 07/25/2022   DYSLIPIDEMIA 04/24/2007   RENAL FAILURE NOS 11/29/2006   Essential hypertension 11/12/2006    ONSET DATE: 07/25/2022  REFERRING DIAG: I61.9 (ICD-10-CM) - CVA (cerebrovascular accident due to intracerebral hemorrhage)   THERAPY DIAG:  Other lack of coordination  Muscle weakness (generalized)  Unsteadiness on feet  Other disturbances of skin sensation  Rationale for Evaluation and Treatment: Rehabilitation  SUBJECTIVE:   SUBJECTIVE STATEMENT: No pain and no new falls reported  Pt accompanied by: significant other - [name]  PERTINENT HISTORY: Pt presented to ED 07/25/2022. "Cranial CT scan negative for acute changes. There was chronic appearing right thalamic lacunar infarct. 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 Status post TNK with interventional radiology for stenting. MRI follow-up showed confluent left ACA territory infarction. Cytotoxic edema with no hemorrhagic transformation. Multiple additional scattered mostly punctate ischemic foci in the left greater than right MCA, PCA and occasionally cerebellar artery territories."  Pt in IRF from 08/01/2022 -08/31/2022 then discharged to OP OT, PT, and ST.   PRECAUTIONS: Fall  WEIGHT BEARING RESTRICTIONS: No  PAIN:  Are you having pain? Yes - Rt shoulder 0-3/10 w/ higher level reaching only  FALLS: Has patient fallen in last 6 months? No  LIVING ENVIRONMENT: Lives with: lives with their family Lives in: House/apartment Stairs: No Has following equipment at home: Dan Humphreys - 2 wheeled, Environmental consultant - 4 wheeled, Wheelchair (manual), bed side  commode, and Grab bars  PLOF: Independent; full-time working for [employer]; driving; sports (football and basketball)  PATIENT GOALS: return to PLOF  OBJECTIVE:   HAND DOMINANCE: Right  ADLs: Overall ADLs: min A  IADLs: Shopping: dependent Light housekeeping: dependent Community mobility: dependent  Handwriting: Not  legible  MOBILITY STATUS: Needs Assist: manual wheelchair  ACTIVITY TOLERANCE: Activity tolerance: fair  FUNCTIONAL OUTCOME MEASURES: FOTO: Slight: 59   UPPER EXTREMITY ROM:     AROM Right (eval) Left (eval)  Shoulder flexion WNL WNL  Shoulder abduction WNL WNL  Elbow flexion WNL WNL  Elbow extension WNL WNL  Wrist flexion WNL WNL  Wrist extension WNL WNL  Wrist pronation WNL WNL  Wrist supination WNL WNL   Digit Composite Flexion WNL WNL  Digit Composite Extension WNL WNL  Digit Opposition WNL WNL  (Blank rows = not tested)  RUE: IR to hip; overall bradykinesia with tremor  UPPER EXTREMITY MMT:     MMT Right (eval) Left (eval)  Shoulder flexion 4/5 WNL  Shoulder abduction 4/5 WNL  Elbow flexion 4/5 WNL  Elbow extension 4/5 WNL  (Blank rows = not tested)  HAND FUNCTION: Grip strength: Right: 38.8 lbs; Left: 82.8 lbs  COORDINATION: 9 Hole Peg test: Right: 139 sec; Left: 32 sec  SENSATION: Paresthesias reported in R fingertips  EDEMA: none reported or observed  MUSCLE TONE: WFL  COGNITION: Overall cognitive status: Within functional limits for tasks assessed  VISION: Subjective report: no changes Baseline vision: Wears glasses for reading only Visual history:  none  VISION ASSESSMENT: WFL  PERCEPTION: WFL  PRAXIS: WFL  OBSERVATIONS: Pt appears well-kept. He is able to propel w/c with increased time. Has RW with him.   TODAY'S TREATMENT:                                                                                                                               Pt appeared more drowsy today. Pt/caregiver reports taking Benadryl night before. BP = 128/75, HR = 75  Pt placing large pegs in pegboard manipulating up to 3 at a time (fingertip to/from palm) to place in board with mod to max difficulty and drops, and extra time required.  Manipulating block in first 3 fingertips in numerical order w/ mod difficulty.  Standing to pick up blocks at  level 2 resistance Rt hand for sustained grip and control and LE wt shift to Rt to place in bowl on elevated surface. Pt required cues to wt bear over Rt LE and fatigued after 2/3 amount of blocks, needing to sit.   UBE x 5 min backwards, level 1 for normal reciprocal movement pattern and UB conditioning      PATIENT EDUCATION: Education details: see above Person educated: Patient and Spouse Education method: Explanation, Demonstration, Tactile cues, Verbal cues, and Handouts Education comprehension: verbalized understanding, returned demonstration, verbal cues required, and needs further education  HOME EXERCISE PROGRAM: 09/12/2022: Chilton Si putty and coordination  HEPs 09/27/22: Additional coordination HEP  10/04/22: shoulder girdle/scapula strengthening HEP    GOALS:  SHORT TERM GOALS: Target date: 10/10/2022    Patient will demonstrate independence with initial RUE HEP. Baseline: Goal status: MET  2.  Patient will demonstrate at least 45 lbs R grip strength as needed to open jars and other containers. Baseline: 38.8 lbs Goal status: IN PROGRESS  3.  Pt will report no difficulty holding a dish while washing it.  Baseline: with some difficulty Goal status: INITIAL   LONG TERM GOALS: Target date: 11/17/2022    Patient will demonstrate updated RUE HEP with 25% verbal cues or less for proper execution. Baseline:  Goal status: IN PROGRESS  2.  Patient will demonstrate at least 50 lbs R grip strength as needed to open jars and other containers. Baseline: 38.8 lbs Goal status: IN PROGRESS  3.  Patient will complete nine-hole peg with use of R in 60 seconds or less. Baseline: 139 seconds Goal status: IN PROGRESS  4.  Pt will complete d/c FOTO.  Baseline:  Goal status: INITIAL   ASSESSMENT:  CLINICAL IMPRESSION: Patient fatigues with standing activity and hesitant to place weight over Rt LE. Pt continues to struggle with in hand manipulation  PERFORMANCE DEFICITS: in  functional skills including ADLs, IADLs, coordination, dexterity, strength, Fine motor control, Gross motor control, mobility, endurance, and UE functional use  IMPAIRMENTS: are limiting patient from ADLs, IADLs, work, and leisure.   CO-MORBIDITIES: may have co-morbidities  that affects occupational performance. Patient will benefit from skilled OT to address above impairments and improve overall function.  MODIFICATION OR ASSISTANCE TO COMPLETE EVALUATION: No modification of tasks or assist necessary to complete an evaluation.  OT OCCUPATIONAL PROFILE AND HISTORY: Problem focused assessment: Including review of records relating to presenting problem.  CLINICAL DECISION MAKING: LOW - limited treatment options, no task modification necessary  REHAB POTENTIAL: Good  EVALUATION COMPLEXITY: Low    PLAN:  OT FREQUENCY: 2x/week  OT DURATION: 8 weeks  PLANNED INTERVENTIONS: self care/ADL training, therapeutic exercise, therapeutic activity, neuromuscular re-education, manual therapy, functional mobility training, splinting, ultrasound, fluidotherapy, patient/family education, DME and/or AE instructions, and Re-evaluation  RECOMMENDED OTHER SERVICES: Receiving PT and ST  CONSULTED AND AGREED WITH PLAN OF CARE: Patient and family member/caregiver  PLAN FOR NEXT SESSION: continue in hand manipulation, standing activity, UBE backwards   Sheran Lawless, OT 10/19/2022, 8:47 AM

## 2022-10-19 NOTE — Therapy (Signed)
OUTPATIENT SPEECH LANGUAGE PATHOLOGY TREATMENT NOTE   Patient Name: Joseph Bright MRN: [id number] DOB:[birth year], 46 y.o., male Today's Date: 10/19/2022  PCP: Noberto Retort, MD REFERRING PROVIDER: Charlton Amor, PA-C  END OF SESSION:   End of Session - 10/19/22 1012     Visit Number 6    Number of Visits 25    Date for SLP Re-Evaluation 12/18/22    SLP Start Time 1015    SLP Stop Time  1100    SLP Time Calculation (min) 45 min    Activity Tolerance Patient tolerated treatment well;Patient limited by fatigue               Past Medical History:  Diagnosis Date   Hypertension    Stroke [facility])    Past Surgical History:  Procedure Laterality Date   IR CT HEAD LTD  08/07/2022   IR INTRA CRAN STENT  08/07/2022   IR PERCUTANEOUS ART THROMBECTOMY/INFUSION INTRACRANIAL INC DIAG ANGIO  07/25/2022   LOOP RECORDER INSERTION N/A 07/28/2022   Procedure: LOOP RECORDER INSERTION;  Surgeon: Regan Lemming, MD;  Location: [facility] INVASIVE CV LAB;  Service: Cardiovascular;  Laterality: N/A;   RADIOLOGY WITH ANESTHESIA N/A 07/25/2022   Procedure: RADIOLOGY WITH ANESTHESIA;  Surgeon: Radiologist, Medication, MD;  Location: [facility] OR;  Service: Radiology;  Laterality: N/A;   TEE WITHOUT CARDIOVERSION N/A 07/28/2022   Procedure: TRANSESOPHAGEAL ECHOCARDIOGRAM;  Surgeon: Parke Poisson, MD;  Location: [facility] INVASIVE CV LAB;  Service: Cardiovascular;  Laterality: N/A;   Patient Active Problem List   Diagnosis Date Noted   Loud snoring 10/18/2022   Adjustment disorder with mixed anxiety and depressed mood 08/07/2022   Cerebrovascular accident (CVA) due to occlusion of left posterior communicating artery (HCC) 08/01/2022   Acute ischemic left ACA stroke (HCC) 07/25/2022   Arterial ischemic stroke, ACA (anterior cerebral artery), left, acute (HCC) 07/25/2022   DYSLIPIDEMIA 04/24/2007   RENAL FAILURE NOS 11/29/2006   Essential hypertension 11/12/2006    ONSET DATE: 07/25/22  REFERRING  DIAG: I61.9 (ICD-10-CM) - CVA (cerebrovascular accident due to intracerebral hemorrhage) (HCC)  THERAPY DIAG:  Aphasia  Rationale for Evaluation and Treatment: Rehabilitation  SUBJECTIVE:   SUBJECTIVE STATEMENT: "I have been talking more on the phone...umm...hmm."  PAIN:  Are you having pain? No, ex-spouse, [name] present  OBJECTIVE:   TODAY'S TREATMENT:                                                                                                                                         DATE:   10/19/22: Pt took a benadryl last night and states that he is tired today, which affected his word finding ability. Targeted word finding through comparing specific features with 80% SLP provided occasional min-A of repetition, phonic and visual cues. Pt able to name 71 professional basketball teams with min-A of extended wait time. Was able to name 3 more when  provided with clues of location. Able to name relevant 12 T.V. shows independently and 6 when provided with semantic and phonemic cues. Demonstrated most success when naming restaurants. Patient demonstrated use of semantic feature analysis to describe x2 during session when word finding difficulty. Led through auditory compensation strategies, patient able to name 2 compensations: reducing background noise, maintaining eye contact, asking speaker to repeat themselves. Education provided on being an active listener within the conversation.   10/16/22: Targeted complex naming task with given 1st letter - Omkar named 12 items with supervision cues and min extended time. He named 16 items in personally relevant category with supervision cues. In complex conversation generating pros and cons to paying student athletes, means testing for social security Frederich used compensations for word finding with rare min questioning cues. He reports success using verbal compensations with friends and family and is using these consistently. [name] endorsed he  used compensations this morning to successfully communicate his weekend activities. In complex conversation, Edgerrin generated complex sentences 6/15 utterances, with extended time.   10/04/22: Led through VNeST with occasional min-A to elaborate and challenge patient to generate more complex vocabulary words. Patient required prompting questions to elaborate on verbs generated while aligning with personal relations. ST transitioned to semantic feature analysis to target word finding. Patient required occasional min-A to find words and was prompted to describe the words. Twice patient benefited from phonemic cues. Ongoing, consistant verbal cues to use anomia strategies required to A in their use when anomia in conversation or structured task occurred.   10/02/22: To target word finding, complex sentence generation  Scientist, product/process development (VNeST) was utilized. The pt generated 3 subjects and objects for 4 verbs (build, chase, chase, climb curl), for a total of 12 subject objects. Pt required usual min semantic cues for 3rd subject/object for each verb cues. Pt generated 4 complex sentences by answering "wh" questions. Pt required rare min cues to generate complex sentences. Frequent extended time due to word finding. During VNeST, Miracle required verbal cues to use compensations for word finding - verbal cues to use gestures as well. Tab trained to accept gesture cues as well as verbal cues. Semantic Feature Analysis (SFA) completed to target word finding and verbal compensations for aphasia - with usual min to mod questioning and semantic cues, Kamareon complete SFA chart for volcano, dominoes, dice (to generate salient descriptions). In moderately complex conversation re: legalized betting, Mykel required frequent verbal cues to use compensations, semantic cues for word finding. Frequent halting/fillers over 8 minute conversation.    09/27/22: Caregiver, ex-spouse present. Educated her re:  impaired auditory comprehension and need for caregiver to provide A with medical, legal and insurance interactions. Trained pt and caregiver on compensations for auditory comprehension. See patient instructions from eval.  To target complex sentence generation and word finding,  Scientist, product/process development (VNeST) was utilized. The pt generated 3 subjects and objects for 4 verbs, (throw, wash, measure, decorate) for a total of 12 subject objects. Pt required occasional to usual semantic cues. Pt generated 4 complex sentences by answering "wh" questions. Pt required occasional min A cues to generate complex sentences. Targeted sentence generation generating sentences for multiple meaning words with usual cues to used varied subjects and to generate sentence rather than definition. Riese generated 10 multiple meaning sentences with frequent extended time for word finding, however with initial cues to limit fillers, he did not use fillers 80% of time. Trained in verbal compensations for word finding generating 3  sentence descriptions of 10 basic objects with usual extended time and occasional questioning cues. Caregiver aware to encourage/cue Elster to use verbal compensations at home.    PATIENT EDUCATION: Education details: See Today's Treatment; aphasia ed, compensatory strategies, see patient instructions Person educated: Patient Education method: Explanation, Demonstration, Verbal cues, and Handouts Education comprehension: verbal cues required and needs further education     GOALS: Goals reviewed with patient? Yes   SHORT TERM GOALS: Target date: 10/23/22   Pt will name 15 items in personally relevant category with rare min A Baseline: 9 items Goal status: MET   2.  Pt will use verbal compensations 4/5 word finding episodes with rare min A Baseline: 1/5 Goal status: MET   3.  Pt will generate complex sentences in structured tasks 4/5x with occasional min A Baseline: no complex  sentences Goal status: ONGOING   4.  Pt will carry over 3 strategies to compensate for auditory comprehension Baseline: No strategies Goal status: MET   LONG TERM GOALS: Target date: 12/18/22   Pt will use verbal compensations for aphasia 5/5x in conversation with rare min A Baseline:  Goal status: INITIAL   2.  Pt will generate complex sentences in simple conversation 4/5 turns with occasional min A Baseline: simple sentences only Goal status: INITIAL   3.  Pt will use compensatory strategies to gain a turn in group or fast moving conversation 3x outside of therapy Baseline: no strategy for this Goal status: INITIAL   4.  Pt will reduce fillers of "um" and "ah" to 5 or less over 5 sentences Baseline: 15+ fillers over 3 sentence narrative Goal status: INITIAL   ASSESSMENT:   CLINICAL IMPRESSION: Patient is a 48 y.o. male who was seen today for moderate aphasia characterized by halting, fillers due to anomia. Gerson continues to improve with word finding in conversation as well as structured naming tasks. He is using verbal compensations for aphasia successfully and reports these strategies are also improving his word finding. Aphasia improved to mild to moderate. Auditory comprehension continues to improve for moderately complex conversation, however he continues to benefit from family being present to manage medical, legal and financial information/conversations. I recommend skilled ST to maximize communication for safety, independence, and return to PLOF.    OBJECTIVE IMPAIRMENTS: include aphasia. These impairments are limiting patient from return to work, ADLs/IADLs, and effectively communicating at home and in community. Factors affecting potential to achieve goals and functional outcome are financial resources. Patient will benefit from skilled SLP services to address above impairments and improve overall function.   REHAB POTENTIAL: Good   PLAN:   SLP FREQUENCY: 2x/week    SLP DURATION: 12 weeks   PLANNED INTERVENTIONS: Language facilitation, Environmental controls, Cueing hierachy, Cognitive reorganization, Internal/external aids, Functional tasks, Multimodal communication approach, and SLP instruction and feedback       [place], Student-SLP 10/19/2022, 10:12 AM

## 2022-10-19 NOTE — Therapy (Signed)
OUTPATIENT PHYSICAL THERAPY NEURO TREATMENT   Patient Name: Joseph Bright MRN: [id number] DOB:[birth year], 47 y.o., male Today's Date: 10/19/2022   PCP: Noberto Retort, MD REFERRING PROVIDER: Charlton Amor, PA-C  END OF SESSION:  PT End of Session - 10/19/22 0931     Visit Number 6    Number of Visits 17   with eval   Date for PT Re-Evaluation 12/18/22    Authorization Type [employer]    PT Start Time 0930    PT Stop Time 1012    PT Time Calculation (min) 42 min    Equipment Utilized During Treatment Gait belt;Other (comment)   R toe cap   Activity Tolerance Patient tolerated treatment well;Patient limited by fatigue    Behavior During Therapy [facility] for tasks assessed/performed                  Past Medical History:  Diagnosis Date   Hypertension    Stroke [facility])    Past Surgical History:  Procedure Laterality Date   IR CT HEAD LTD  08/07/2022   IR INTRA CRAN STENT  08/07/2022   IR PERCUTANEOUS ART THROMBECTOMY/INFUSION INTRACRANIAL INC DIAG ANGIO  07/25/2022   LOOP RECORDER INSERTION N/A 07/28/2022   Procedure: LOOP RECORDER INSERTION;  Surgeon: Regan Lemming, MD;  Location: [facility] INVASIVE CV LAB;  Service: Cardiovascular;  Laterality: N/A;   RADIOLOGY WITH ANESTHESIA N/A 07/25/2022   Procedure: RADIOLOGY WITH ANESTHESIA;  Surgeon: Radiologist, Medication, MD;  Location: [facility] OR;  Service: Radiology;  Laterality: N/A;   TEE WITHOUT CARDIOVERSION N/A 07/28/2022   Procedure: TRANSESOPHAGEAL ECHOCARDIOGRAM;  Surgeon: Parke Poisson, MD;  Location: [facility] INVASIVE CV LAB;  Service: Cardiovascular;  Laterality: N/A;   Patient Active Problem List   Diagnosis Date Noted   Loud snoring 10/18/2022   Adjustment disorder with mixed anxiety and depressed mood 08/07/2022   Cerebrovascular accident (CVA) due to occlusion of left posterior communicating artery (HCC) 08/01/2022   Acute ischemic left ACA stroke (HCC) 07/25/2022   Arterial ischemic stroke, ACA (anterior cerebral  artery), left, acute (HCC) 07/25/2022   DYSLIPIDEMIA 04/24/2007   RENAL FAILURE NOS 11/29/2006   Essential hypertension 11/12/2006    ONSET DATE: 08/24/2022 (referral date)  REFERRING DIAG: I63.9 (ICD-10-CM) - CVA (cerebral vascular accident) (HCC)  THERAPY DIAG:  Muscle weakness (generalized)  Unsteadiness on feet  Other abnormalities of gait and mobility  Rationale for Evaluation and Treatment: Rehabilitation  SUBJECTIVE:  SUBJECTIVE STATEMENT: Pt did have an appt with his PCP since last visit, had amlodipine changed from 5 mg to 10 mg. Per OT BP is better today, see OT note for readings.  Pt accompanied by: self ex wife and caregiver [name] ("Tab")  PERTINENT HISTORY:  hypertension, CKD stage IV followed by outpatient nephrology  PAIN:  Are you having pain? No  PRECAUTIONS: Fall and ICD/Pacemaker  WEIGHT BEARING RESTRICTIONS: No  FALLS: Has patient fallen in last 6 months? Yes. Number of falls 1, fell last weekend at home trying to do something by himself, he bumped his head, his ex-wife helped him to get back up after the fall  LIVING ENVIRONMENT: Lives with:  lives with ex-wife Lives in: House/apartment Stairs: No Has following equipment at home: Environmental consultant - 2 wheeled, Wheelchair (manual), shower chair, and bed side commode  PLOF: Needs assistance with ADLs, Needs assistance with homemaking, Needs assistance with gait, and Needs assistance with transfers  PATIENT GOALS: "to walk"  OBJECTIVE:   DIAGNOSTIC FINDINGS:  Brain MRI 07/26/22 IMPRESSION: 1. Confluent Left ACA territory infarct. Cytotoxic edema with no hemorrhagic transformation.   2. Multiple additional scattered mostly punctate ischemic foci in the left greater than right MCA, PCA, and occasionally cerebellar artery  territories. No significant intracranial mass effect.   3. 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   4. See intracranial MRA is reported separately today.    COGNITION: Overall cognitive status: Within functional limits for tasks assessed   SENSATION: N/T in R hemibody, impaired sensation though light touch appears intact  EDEMA:  Swelling in RLE/R foot  POSTURE: rounded shoulders, forward head, and posterior pelvic tilt   LOWER EXTREMITY MMT:    MMT Right Eval Left Eval  Hip flexion 1 5  Hip extension    Hip abduction    Hip adduction    Hip internal rotation    Hip external rotation    Knee flexion 1 5  Knee extension 1 5  Ankle dorsiflexion 0 5  Ankle plantarflexion    Ankle inversion    Ankle eversion    (Blank rows = not tested)  VITALS  There were no vitals filed for this visit.     TODAY'S TREATMENT:                  TherAct Vitals not assessed as OT reports assessing them prior to this session, see OT note for readings. Pt with no complaints of HA or dizziness during session.   NMR Static stance with LLE on blue dynadisc, progression from BUE support to one UE support with CGA Pt able to stand x 30 sec, 3 x 10 sec before LOB during 1st round; 2 x 15 sec, x 26 sec, x 32 sec during 2nd round Focus on use of ankle balance strategies to prevent fall Tall-kneeling with bench on mat table to work on isolation of hip musculature: Tall-kneeling squats 2 x 10 reps with manual assist to increase weight shift to R side Lateral sidestepping 2 x 5 ft each direction with min A needed for RLE management   Gait Gait pattern:  vaulting LLE, decreased hip/knee flexion- Right, decreased ankle dorsiflexion- Right, and poor foot clearance- Right Distance walked: 75 ft before onset of fatigue Assistive device utilized: Walker - 2 wheeled Level of assistance: Min A Comments: decreased vaulting of LLE initially, difficulty  advancing RLE with onset of fatigue; needs several standing rest breaks before needing to stop; ambulation  performed after NMR so pt very fatigued by end of session                                                                                               PATIENT EDUCATION: Education details: continue HEP, continue to monitor BP in afternoons and write down values--reach out to PCP, working on improved foot placement w/RLE at home.  Person educated: Patient and Caregiver ex-wife [name] Education method: Explanation Education comprehension: verbalized understanding and needs further education  HOME EXERCISE PROGRAM: Access Code: [email or web address] URL: [email or web address] Date: 09/27/2022 Prepared by: Peter Congo  Exercises - Hooklying Heel Slide  - 1 x daily - 7 x weekly - 2 sets - 10-15 reps - Supine Bridge  - 1 x daily - 7 x weekly - 2-3 sets - 15 reps - Clamshell  - 1 x daily - 7 x weekly - 2 sets - 10 reps - Sidelying Reverse Clamshell  - 1 x daily - 7 x weekly - 2 sets - 10 reps - Long Sitting Hamstring Stretch  - 1 x daily - 7 x weekly - 1 sets - 3-5 reps - 30 sec hold - Staggered Sit-to-Stand  - 1 x daily - 7 x weekly - 3 sets - 5 reps   GOALS: Goals reviewed with patient? Yes  SHORT TERM GOALS: Target date: 10/23/2022  Pt will be independent with initial outpatient HEP for improved strength, balance, transfers and gait. Baseline: Goal status: INITIAL  2.  Pt will improve 5 x STS to less than or equal to 30 seconds to demonstrate improved functional strength and transfer efficiency.  Baseline: 35.69 sec (7/1) Goal status: INITIAL  3.  Pt will improve normal TUG to less than or equal to 55 seconds for improved functional mobility and decreased fall risk. Baseline: 67 sec with RW (7/1) Goal status: INITIAL  4.  Pt will improve gait velocity to at least .90 ft/sec for improved gait efficiency and performance at CGA level  Baseline: 0.67 ft/sec with RW and  CGA (7/1) Goal status: INITIAL  5.  Pt will ambulate x 150 ft with LRAD and CGA to demonstrate improved endurance and increased independence with functional mobility. Baseline: 32.8 ft with RW and CGA Goal status: INITIAL  6.  Pt will initiate stair training as safe and able. Baseline:  Goal status: INITIAL  LONG TERM GOALS: Target date: 11/23/2022   Pt will be independent with final HEP for improved strength, balance, transfers and gait. Baseline:  Goal status: INITIAL  2.  Pt will improve 5 x STS to less than or equal to 25 seconds to demonstrate improved functional strength and transfer efficiency.  Baseline: 35.69 sec (7/1) Goal status: INITIAL  3.  Pt will improve normal TUG to less than or equal to 45 seconds for improved functional mobility and decreased fall risk. Baseline: 67 sec with RW (7/1) Goal status: INITIAL  4.  Pt will improve gait velocity to at least 1.25 ft/sec for improved gait efficiency and performance at SBA level  Baseline: 0.67 ft/sec with RW and CGA (7/1) Goal status: INITIAL  5.  Pt will ambulate x 300 ft with LRAD and SBA to demonstrate improved endurance and increased independence with functional mobility. Baseline: 32.8 ft with RW and CGA Goal status: INITIAL  6.  Pt will ascend/descend 12 stairs with LRAD and min A to demonstrate increased independence with functional mobility. Baseline: not assessed at eval Goal status: INITIAL  ASSESSMENT:  CLINICAL IMPRESSION: Emphasis of skilled PT session on continuing to work on RLE NMR and gait training. Pt does fatigue quickly with increasing weight shift and WB on RLE during session with frequent rest breaks needed. However, pt shows good progression with ability to perform tasks in tall-kneeling on mat table this session. Pt very fatigued following RLE NMR and unable to ambulate further than 75 ft due to RLE fatigue and inability to continue to advance limb. Pt continues to benefit from skilled therapy  services to work towards increasing his safety and independence with functional mobility. Continue POC.    OBJECTIVE IMPAIRMENTS: Abnormal gait, cardiopulmonary status limiting activity, decreased activity tolerance, decreased balance, decreased coordination, decreased endurance, decreased mobility, difficulty walking, decreased strength, increased edema, impaired sensation, and impaired UE functional use.   ACTIVITY LIMITATIONS: carrying, lifting, bending, standing, squatting, stairs, transfers, bed mobility, bathing, toileting, dressing, reach over head, and hygiene/grooming  PARTICIPATION LIMITATIONS: meal prep, cleaning, laundry, interpersonal relationship, driving, shopping, community activity, and occupation  PERSONAL FACTORS: 1-2 comorbidities:    hypertension, CKD stage IV followed outpatient nephrology are also affecting patient's functional outcome.   REHAB POTENTIAL: Good  CLINICAL DECISION MAKING: Stable/uncomplicated  EVALUATION COMPLEXITY: Moderate  PLAN:  PT FREQUENCY: 2x/week  PT DURATION: 8 weeks  PLANNED INTERVENTIONS: Therapeutic exercises, Therapeutic activity, Neuromuscular re-education, Balance training, Gait training, Patient/Family education, Self Care, Joint mobilization, Stair training, Vestibular training, Canalith repositioning, Visual/preceptual remediation/compensation, Orthotic/Fit training, DME instructions, Aquatic Therapy, Dry Needling, Wheelchair mobility training, Cryotherapy, Moist heat, Taping, Manual therapy, and Re-evaluation  PLAN FOR NEXT SESSION: check BP, assess STG, work on RLE NMR and strengthening, gait with RW--needs to work on decreasing vaulting with his LLE when stepping with RLE (could do forward/backward steps at mat or in // bars, sit to stands with step under LLE, work on R HS strengthening, add lift to L side? Standing next to wall to block L hip); has defibrillator so no e-stim    Peter Congo, PT, DPT, CSRS   10/19/2022, 10:13  AM

## 2022-10-23 ENCOUNTER — Ambulatory Visit: Payer: Managed Care, Other (non HMO) | Admitting: Occupational Therapy

## 2022-10-23 ENCOUNTER — Ambulatory Visit: Payer: Managed Care, Other (non HMO) | Admitting: Speech Pathology

## 2022-10-23 ENCOUNTER — Ambulatory Visit: Payer: Managed Care, Other (non HMO) | Admitting: Physical Therapy

## 2022-10-23 DIAGNOSIS — R2689 Other abnormalities of gait and mobility: Secondary | ICD-10-CM

## 2022-10-23 DIAGNOSIS — R29818 Other symptoms and signs involving the nervous system: Secondary | ICD-10-CM

## 2022-10-23 DIAGNOSIS — M6281 Muscle weakness (generalized): Secondary | ICD-10-CM | POA: Diagnosis not present

## 2022-10-23 DIAGNOSIS — R278 Other lack of coordination: Secondary | ICD-10-CM

## 2022-10-23 DIAGNOSIS — I63522 Cerebral infarction due to unspecified occlusion or stenosis of left anterior cerebral artery: Secondary | ICD-10-CM

## 2022-10-23 DIAGNOSIS — R2681 Unsteadiness on feet: Secondary | ICD-10-CM

## 2022-10-23 DIAGNOSIS — R4701 Aphasia: Secondary | ICD-10-CM

## 2022-10-23 DIAGNOSIS — R208 Other disturbances of skin sensation: Secondary | ICD-10-CM

## 2022-10-23 NOTE — Therapy (Signed)
OUTPATIENT OCCUPATIONAL THERAPY NEURO TREATMENT  Patient Name: Joseph Bright MRN: [id number] DOB:01-Mar-1976, 47 y.o., male Today's Date: 10/23/2022  PCP: Noberto Retort, MD  REFERRING PROVIDER: Charlton Amor, PA-C   END OF SESSION:  OT End of Session - 10/23/22 1235     Visit Number 7    Number of Visits 17    Date for OT Re-Evaluation 11/20/22   Date adjusted as pt was not wanting to schedue visits the week of 09/18/2022   Authorization Type [employer] (60 VL - OT, PT, ST)    OT Start Time 1235    OT Stop Time 1315    OT Time Calculation (min) 40 min    Activity Tolerance Patient tolerated treatment well    Behavior During Therapy [facility] for tasks assessed/performed             Past Medical History:  Diagnosis Date   Hypertension    Stroke [facility])    Past Surgical History:  Procedure Laterality Date   IR CT HEAD LTD  08/07/2022   IR INTRA CRAN STENT  08/07/2022   IR PERCUTANEOUS ART THROMBECTOMY/INFUSION INTRACRANIAL INC DIAG ANGIO  07/25/2022   LOOP RECORDER INSERTION N/A 07/28/2022   Procedure: LOOP RECORDER INSERTION;  Surgeon: Regan Lemming, MD;  Location: [facility] INVASIVE CV LAB;  Service: Cardiovascular;  Laterality: N/A;   RADIOLOGY WITH ANESTHESIA N/A 07/25/2022   Procedure: RADIOLOGY WITH ANESTHESIA;  Surgeon: Radiologist, Medication, MD;  Location: [facility] OR;  Service: Radiology;  Laterality: N/A;   TEE WITHOUT CARDIOVERSION N/A 07/28/2022   Procedure: TRANSESOPHAGEAL ECHOCARDIOGRAM;  Surgeon: Parke Poisson, MD;  Location: [facility] INVASIVE CV LAB;  Service: Cardiovascular;  Laterality: N/A;   Patient Active Problem List   Diagnosis Date Noted   Loud snoring 10/18/2022   Adjustment disorder with mixed anxiety and depressed mood 08/07/2022   Cerebrovascular accident (CVA) due to occlusion of left posterior communicating artery (HCC) 08/01/2022   Acute ischemic left ACA stroke (HCC) 07/25/2022   Arterial ischemic stroke, ACA (anterior cerebral artery), left, acute  (HCC) 07/25/2022   DYSLIPIDEMIA 04/24/2007   RENAL FAILURE NOS 11/29/2006   Essential hypertension 11/12/2006    ONSET DATE: 07/25/2022  REFERRING DIAG: I61.9 (ICD-10-CM) - CVA (cerebrovascular accident due to intracerebral hemorrhage)   THERAPY DIAG:  Other lack of coordination  Muscle weakness (generalized)  Other disturbances of skin sensation  Other symptoms and signs involving the nervous system  Arterial ischemic stroke, ACA (anterior cerebral artery), left, acute (HCC)  Rationale for Evaluation and Treatment: Rehabilitation  SUBJECTIVE:   SUBJECTIVE STATEMENT: Pt states he has pain in R shoulder especially raising past 90* in scaption.   Pt accompanied by: Self  PERTINENT HISTORY: Pt presented to ED 07/25/2022. "Cranial CT scan negative for acute changes. There was chronic appearing right thalamic lacunar infarct. 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 Status post TNK with interventional radiology for stenting. MRI follow-up showed confluent left ACA territory infarction. Cytotoxic edema with no hemorrhagic transformation. Multiple additional scattered mostly punctate ischemic foci in the left greater than right MCA, PCA and occasionally cerebellar artery territories."  Pt in IRF from 08/01/2022 -08/31/2022 then discharged to OP OT, PT, and ST.   PRECAUTIONS: Fall  WEIGHT BEARING RESTRICTIONS: No  PAIN:  Are you having pain? Yes - Rt shoulder 0-3/10 w/ higher level reaching only (scaption)  FALLS: Has patient fallen in last 6 months? No  LIVING ENVIRONMENT: Lives with: lives with their family  Lives in: House/apartment Stairs: No Has following equipment at home: Dan Humphreys - 2 wheeled, Environmental consultant - 4 wheeled, Wheelchair (manual), bed side commode, and Grab bars  PLOF: Independent; full-time working for [employer]; driving; sports (football and basketball)  PATIENT GOALS: return to [employer]  OBJECTIVE:   HAND DOMINANCE:  Right  ADLs: Overall ADLs: min A  IADLs: Shopping: dependent Light housekeeping: dependent Community mobility: dependent  Handwriting: Not legible  MOBILITY STATUS: Needs Assist: manual wheelchair  ACTIVITY TOLERANCE: Activity tolerance: fair  FUNCTIONAL OUTCOME MEASURES: FOTO: Slight: 59 10/23/2022: 79   UPPER EXTREMITY ROM:     AROM Right (eval) Left (eval)  Shoulder flexion WNL WNL  Shoulder abduction WNL WNL  Elbow flexion WNL WNL  Elbow extension WNL WNL  Wrist flexion WNL WNL  Wrist extension WNL WNL  Wrist pronation WNL WNL  Wrist supination WNL WNL   Digit Composite Flexion WNL WNL  Digit Composite Extension WNL WNL  Digit Opposition WNL WNL  (Blank rows = not tested)  RUE: IR to hip; overall bradykinesia with tremor  UPPER EXTREMITY MMT:     MMT Right (eval) Left (eval)  Shoulder flexion 4/5 WNL  Shoulder abduction 4/5 WNL  Elbow flexion 4/5 WNL  Elbow extension 4/5 WNL  (Blank rows = not tested)  HAND FUNCTION: Grip strength: Right: 38.8 lbs; Left: 82.8 lbs 10/23/2022: 57.5 lbs  COORDINATION: 9 Hole Peg test: Right: 139 sec; Left: 32 sec 10/23/2022: 59 seconds  SENSATION: Paresthesias reported in R fingertips  EDEMA: none reported or observed  MUSCLE TONE: WFL  COGNITION: Overall cognitive status: Within functional limits for tasks assessed  VISION: Subjective report: no changes Baseline vision: Wears glasses for reading only Visual history:  none  VISION ASSESSMENT: WFL  PERCEPTION: WFL  PRAXIS: WFL  OBSERVATIONS: Pt appears well-kept. He is able to propel w/c with increased time. Has RW with him.   TODAY'S TREATMENT:            - Self-care/home management completed for duration as noted below including:                                                                                                                    Therapist reviewed goals with patient and updated patient progression.  No additional functional  limitations identified. Objective measures assessed as noted in Goals section to determine progression towards goals. Pt completed dish washing at sink while in standing with RW using RUE Primarily.  - Neuro re-education completed for duration as noted below including: OT initiated R shoulder HEP as noted in pt instructions for improved ROM and proprioceptive input.    PATIENT EDUCATION: Education details: R shoulder HEP Person educated: Patient Education method: Explanation, Demonstration, Tactile cues, Verbal cues, and Handouts Education comprehension: verbalized understanding, returned demonstration, verbal cues required, and needs further education  HOME EXERCISE PROGRAM: 09/12/2022: Chilton Si putty and coordination HEPs 09/27/22: Additional coordination HEP  10/04/22: shoulder girdle/scapula strengthening HEP  10/23/2022: Shoulder slides and alphabet  GOALS:  SHORT TERM  GOALS: Target date: 10/10/2022    Patient will demonstrate independence with initial RUE HEP. Baseline: Goal status: MET  2.  Patient will demonstrate at least 45 lbs R grip strength as needed to open jars and other containers. Baseline: 38.8 lbs 10/23/2022: 57.5 lbs Goal status: MET  3.  Pt will report no difficulty holding a dish while washing it.  Baseline: with some difficulty 10/23/2022: has not tried recently; simulated in clinic: little difficulty Goal status: IN PROGRESS   LONG TERM GOALS: Target date: 11/17/2022    Patient will demonstrate updated RUE HEP with 25% verbal cues or less for proper execution. Baseline:  Goal status: IN PROGRESS  2.  Patient will demonstrate at least 50 lbs R grip strength as needed to open jars and other containers. Baseline: 38.8 lbs 10/23/2022: 57.5 lbs Goal status: MET  3.  Patient will complete nine-hole peg with use of R in 45 seconds or less. Baseline: 139 seconds 10/23/2022: 59 seconds; GOAL UPDATED (< or = 60 seconds originally) Goal status: REVISED  4.  Pt  will complete d/c FOTO.  Baseline: 59 10/23/2022: 79 Goal status: INITIAL   ASSESSMENT:  CLINICAL IMPRESSION: Pt demonstrates good progression towards goals. Limited by L shoulder AROM and pain past 90* and in hand manipulation.   PERFORMANCE DEFICITS: in functional skills including ADLs, IADLs, coordination, dexterity, strength, Fine motor control, Gross motor control, mobility, endurance, and UE functional use  IMPAIRMENTS: are limiting patient from ADLs, IADLs, work, and leisure.   CO-MORBIDITIES: may have co-morbidities  that affects occupational performance. Patient will benefit from skilled OT to address above impairments and improve overall function.  REHAB POTENTIAL: Good   PLAN:  OT FREQUENCY: 2x/week  OT DURATION: 8 weeks  PLANNED INTERVENTIONS: self care/ADL training, therapeutic exercise, therapeutic activity, neuromuscular re-education, manual therapy, functional mobility training, splinting, ultrasound, fluidotherapy, patient/family education, DME and/or AE instructions, and Re-evaluation  RECOMMENDED OTHER SERVICES: Receiving PT and ST  CONSULTED AND AGREED WITH PLAN OF CARE: Patient and family member/caregiver  PLAN FOR NEXT SESSION: Work on Social worker; in hand manipulation   Delana Meyer, OT 10/23/2022, 12:35 PM

## 2022-10-23 NOTE — Progress Notes (Signed)
[facility] Summary Report / Loop Recorder 

## 2022-10-23 NOTE — Therapy (Signed)
OUTPATIENT SPEECH LANGUAGE PATHOLOGY TREATMENT NOTE   Patient Name: Joseph Bright MRN: [id number] DOB:1975/06/29, 47 y.o., male Today's Date: 10/23/2022  PCP: Noberto Retort, MD REFERRING PROVIDER: Charlton Amor, PA-C  END OF SESSION:   End of Session - 10/23/22 1054     Visit Number 7    Number of Visits 25    Date for SLP Re-Evaluation 12/18/22    SLP Start Time 1100    SLP Stop Time  1145    SLP Time Calculation (min) 45 min    Activity Tolerance Patient tolerated treatment well;Patient limited by fatigue             Past Medical History:  Diagnosis Date   Hypertension    Stroke [facility])    Past Surgical History:  Procedure Laterality Date   IR CT HEAD LTD  08/07/2022   IR INTRA CRAN STENT  08/07/2022   IR PERCUTANEOUS ART THROMBECTOMY/INFUSION INTRACRANIAL INC DIAG ANGIO  07/25/2022   LOOP RECORDER INSERTION N/A 07/28/2022   Procedure: LOOP RECORDER INSERTION;  Surgeon: Regan Lemming, MD;  Location: [facility] INVASIVE CV LAB;  Service: Cardiovascular;  Laterality: N/A;   RADIOLOGY WITH ANESTHESIA N/A 07/25/2022   Procedure: RADIOLOGY WITH ANESTHESIA;  Surgeon: Radiologist, Medication, MD;  Location: [facility] OR;  Service: Radiology;  Laterality: N/A;   TEE WITHOUT CARDIOVERSION N/A 07/28/2022   Procedure: TRANSESOPHAGEAL ECHOCARDIOGRAM;  Surgeon: Parke Poisson, MD;  Location: [facility] INVASIVE CV LAB;  Service: Cardiovascular;  Laterality: N/A;   Patient Active Problem List   Diagnosis Date Noted   Loud snoring 10/18/2022   Adjustment disorder with mixed anxiety and depressed mood 08/07/2022   Cerebrovascular accident (CVA) due to occlusion of left posterior communicating artery (HCC) 08/01/2022   Acute ischemic left ACA stroke (HCC) 07/25/2022   Arterial ischemic stroke, ACA (anterior cerebral artery), left, acute (HCC) 07/25/2022   DYSLIPIDEMIA 04/24/2007   RENAL FAILURE NOS 11/29/2006   Essential hypertension 11/12/2006    ONSET DATE: 07/25/22  REFERRING DIAG:  I61.9 (ICD-10-CM) - CVA (cerebrovascular accident due to intracerebral hemorrhage) (HCC)  THERAPY DIAG:  Aphasia  Rationale for Evaluation and Treatment: Rehabilitation  SUBJECTIVE:   SUBJECTIVE STATEMENT: "A bit more awake today."  PAIN:  Are you having pain? No, ex-spouse, [name] present  OBJECTIVE:   TODAY'S TREATMENT:                                                                                                                                         DATE:   10/23/22: Pt more alert during today's session, and is now coming to sessions alone because spouse has gone back to work. Pt able to name 10 words within a category during warm up activity. SLP targeted word finding with scattergories. Patient able to generate words in a given category with 40% accuracy. Required frequent mod-A of semantic and phonemic cues as well as immediate feedback  to generate more responses. Patient states that word finding is going better at home, and that he is implementing the practice of semantic feature analysis, which has helped.   10/19/22: Pt took a benadryl last night and states that he is tired today, which affected his word finding ability. Targeted word finding through comparing specific features with 80% SLP provided occasional min-A of repetition, phonic and visual cues. Pt able to name 81 professional basketball teams with min-A of extended wait time. Was able to name 3 more when provided with clues of location. Able to name relevant 12 T.V. shows independently and 6 when provided with semantic and phonemic cues. Demonstrated most success when naming restaurants. Patient demonstrated use of semantic feature analysis to describe x2 during session when word finding difficulty. Led through auditory compensation strategies, patient able to name 2 compensations: reducing background noise, maintaining eye contact, asking speaker to repeat themselves. Education provided on being an active listener within  the conversation.   10/16/22: Targeted complex naming task with given 1st letter - Manuel named 12 items with supervision cues and min extended time. He named 16 items in personally relevant category with supervision cues. In complex conversation generating pros and cons to paying student athletes, means testing for social security Flem used compensations for word finding with rare min questioning cues. He reports success using verbal compensations with friends and family and is using these consistently. [name] endorsed he used compensations this morning to successfully communicate his weekend activities. In complex conversation, Hiran generated complex sentences 6/15 utterances, with extended time.   10/04/22: Led through VNeST with occasional min-A to elaborate and challenge patient to generate more complex vocabulary words. Patient required prompting questions to elaborate on verbs generated while aligning with personal relations. ST transitioned to semantic feature analysis to target word finding. Patient required occasional min-A to find words and was prompted to describe the words. Twice patient benefited from phonemic cues. Ongoing, consistant verbal cues to use anomia strategies required to A in their use when anomia in conversation or structured task occurred.   10/02/22: To target word finding, complex sentence generation  Scientist, product/process development (VNeST) was utilized. The pt generated 3 subjects and objects for 4 verbs (build, chase, chase, climb curl), for a total of 12 subject objects. Pt required usual min semantic cues for 3rd subject/object for each verb cues. Pt generated 4 complex sentences by answering "wh" questions. Pt required rare min cues to generate complex sentences. Frequent extended time due to word finding. During VNeST, Joud required verbal cues to use compensations for word finding - verbal cues to use gestures as well. Tab trained to accept gesture cues as  well as verbal cues. Semantic Feature Analysis (SFA) completed to target word finding and verbal compensations for aphasia - with usual min to mod questioning and semantic cues, Alieu complete SFA chart for volcano, dominoes, dice (to generate salient descriptions). In moderately complex conversation re: legalized betting, Kuzey required frequent verbal cues to use compensations, semantic cues for word finding. Frequent halting/fillers over 8 minute conversation.    09/27/22: Caregiver, ex-spouse present. Educated her re: impaired auditory comprehension and need for caregiver to provide A with medical, legal and insurance interactions. Trained pt and caregiver on compensations for auditory comprehension. See patient instructions from eval.  To target complex sentence generation and word finding,  Scientist, product/process development (VNeST) was utilized. The pt generated 3 subjects and objects for 4 verbs, (throw, wash, measure, decorate) for a total  of 12 subject objects. Pt required occasional to usual semantic cues. Pt generated 4 complex sentences by answering "wh" questions. Pt required occasional min A cues to generate complex sentences. Targeted sentence generation generating sentences for multiple meaning words with usual cues to used varied subjects and to generate sentence rather than definition. Mouhamed generated 10 multiple meaning sentences with frequent extended time for word finding, however with initial cues to limit fillers, he did not use fillers 80% of time. Trained in verbal compensations for word finding generating 3 sentence descriptions of 10 basic objects with usual extended time and occasional questioning cues. Caregiver aware to encourage/cue Custer to use verbal compensations at home.    PATIENT EDUCATION: Education details: See Today's Treatment; aphasia ed, compensatory strategies, see patient instructions Person educated: Patient Education method: Explanation,  Demonstration, Verbal cues, and Handouts Education comprehension: verbal cues required and needs further education     GOALS: Goals reviewed with patient? Yes   SHORT TERM GOALS: Target date: 10/23/22   Pt will name 15 items in personally relevant category with rare min A Baseline: 9 items Goal status: MET   2.  Pt will use verbal compensations 4/5 word finding episodes with rare min A Baseline: 1/5 Goal status: MET   3.  Pt will generate complex sentences in structured tasks 4/5x with occasional min A Baseline: no complex sentences Goal status: ONGOING   4.  Pt will carry over 3 strategies to compensate for auditory comprehension Baseline: No strategies Goal status: MET   LONG TERM GOALS: Target date: 12/18/22   Pt will use verbal compensations for aphasia 5/5x in conversation with rare min A Baseline:  Goal status: INITIAL   2.  Pt will generate complex sentences in simple conversation 4/5 turns with occasional min A Baseline: simple sentences only Goal status: INITIAL   3.  Pt will use compensatory strategies to gain a turn in group or fast moving conversation 3x outside of therapy Baseline: no strategy for this Goal status: INITIAL   4.  Pt will reduce fillers of "um" and "ah" to 5 or less over 5 sentences Baseline: 15+ fillers over 3 sentence narrative Goal status: INITIAL   ASSESSMENT:   CLINICAL IMPRESSION: Patient is a 47 y.o. male who was seen today for moderate aphasia characterized by halting, fillers due to anomia. Jamary continues to improve with word finding in conversation as well as structured naming tasks. He is using verbal compensations for aphasia successfully and reports these strategies are also improving his word finding. Aphasia improved to mild to moderate. Auditory comprehension continues to improve for moderately complex conversation, however he continues to benefit from family being present to manage medical, legal and financial  information/conversations. I recommend skilled ST to maximize communication for safety, independence, and return to PLOF.    OBJECTIVE IMPAIRMENTS: include aphasia. These impairments are limiting patient from return to work, ADLs/IADLs, and effectively communicating at home and in community. Factors affecting potential to achieve goals and functional outcome are financial resources. Patient will benefit from skilled SLP services to address above impairments and improve overall function.   REHAB POTENTIAL: Good   PLAN:   SLP FREQUENCY: 2x/week   SLP DURATION: 12 weeks   PLANNED INTERVENTIONS: Language facilitation, Environmental controls, Cueing hierachy, Cognitive reorganization, Internal/external aids, Functional tasks, Multimodal communication approach, and SLP instruction and feedback       Lovvorn, Radene Journey, CCC-SLP 10/23/2022, 12:10 PM

## 2022-10-23 NOTE — Therapy (Signed)
OUTPATIENT PHYSICAL THERAPY NEURO TREATMENT   Patient Name: Joseph Bright MRN: [id number] DOB:[birth year], 47 y.o., male Today's Date: 10/23/2022   PCP: Noberto Retort, MD REFERRING PROVIDER: Charlton Amor, PA-C  END OF SESSION:  PT End of Session - 10/23/22 1146     Visit Number 7    Number of Visits 17   with eval   Date for PT Re-Evaluation 12/18/22    Authorization Type [employer]    PT Start Time 1145    PT Stop Time 1230    PT Time Calculation (min) 45 min    Equipment Utilized During Treatment Gait belt;Other (comment)   R toe cap   Activity Tolerance Patient tolerated treatment well    Behavior During Therapy [facility] for tasks assessed/performed                   Past Medical History:  Diagnosis Date   Hypertension    Stroke [facility])    Past Surgical History:  Procedure Laterality Date   IR CT HEAD LTD  08/07/2022   IR INTRA CRAN STENT  08/07/2022   IR PERCUTANEOUS ART THROMBECTOMY/INFUSION INTRACRANIAL INC DIAG ANGIO  07/25/2022   LOOP RECORDER INSERTION N/A 07/28/2022   Procedure: LOOP RECORDER INSERTION;  Surgeon: Regan Lemming, MD;  Location: [facility] INVASIVE CV LAB;  Service: Cardiovascular;  Laterality: N/A;   RADIOLOGY WITH ANESTHESIA N/A 07/25/2022   Procedure: RADIOLOGY WITH ANESTHESIA;  Surgeon: Radiologist, Medication, MD;  Location: [facility] OR;  Service: Radiology;  Laterality: N/A;   TEE WITHOUT CARDIOVERSION N/A 07/28/2022   Procedure: TRANSESOPHAGEAL ECHOCARDIOGRAM;  Surgeon: Parke Poisson, MD;  Location: [facility] INVASIVE CV LAB;  Service: Cardiovascular;  Laterality: N/A;   Patient Active Problem List   Diagnosis Date Noted   Loud snoring 10/18/2022   Adjustment disorder with mixed anxiety and depressed mood 08/07/2022   Cerebrovascular accident (CVA) due to occlusion of left posterior communicating artery (HCC) 08/01/2022   Acute ischemic left ACA stroke (HCC) 07/25/2022   Arterial ischemic stroke, ACA (anterior cerebral artery), left, acute (HCC)  07/25/2022   DYSLIPIDEMIA 04/24/2007   RENAL FAILURE NOS 11/29/2006   Essential hypertension 11/12/2006    ONSET DATE: 08/24/2022 (referral date)  REFERRING DIAG: I63.9 (ICD-10-CM) - CVA (cerebral vascular accident) (HCC)  THERAPY DIAG:  Muscle weakness (generalized)  Unsteadiness on feet  Other abnormalities of gait and mobility  Rationale for Evaluation and Treatment: Rehabilitation  SUBJECTIVE:  SUBJECTIVE STATEMENT: Pt reports no acute changes since last visit, no falls. Pt reports some R shoulder pain at night in bed, has been educated by OT about positioning with pillows. Pt reports he checks his BP daily and it has been improving. Pt reports that HEP is going well at home, remains challenging.  Pt accompanied by: self; driven by ex wife and caregiver [name] ("Tab")  PERTINENT HISTORY:  hypertension, CKD stage IV followed by outpatient nephrology  PAIN:  Are you having pain? No  PRECAUTIONS: Fall and ICD/Pacemaker  WEIGHT BEARING RESTRICTIONS: No  FALLS: Has patient fallen in last 6 months? Yes. Number of falls 1, fell last weekend at home trying to do something by himself, he bumped his head, his ex-wife helped him to get back up after the fall  LIVING ENVIRONMENT: Lives with:  lives with ex-wife Lives in: House/apartment Stairs: No Has following equipment at home: Environmental consultant - 2 wheeled, Wheelchair (manual), shower chair, and bed side commode  PLOF: Needs assistance with ADLs, Needs assistance with homemaking, Needs assistance with gait, and Needs assistance with transfers  PATIENT GOALS: "to walk"  OBJECTIVE:   DIAGNOSTIC FINDINGS:  Brain MRI 07/26/22 IMPRESSION: 1. Confluent Left ACA territory infarct. Cytotoxic edema with no hemorrhagic transformation.   2. Multiple  additional scattered mostly punctate ischemic foci in the left greater than right MCA, PCA, and occasionally cerebellar artery territories. No significant intracranial mass effect.   3. 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   4. See intracranial MRA is reported separately today.    COGNITION: Overall cognitive status: Within functional limits for tasks assessed   SENSATION: N/T in R hemibody, impaired sensation though light touch appears intact  EDEMA:  Swelling in RLE/R foot  POSTURE: rounded shoulders, forward head, and posterior pelvic tilt   LOWER EXTREMITY MMT:    MMT Right Eval Left Eval  Hip flexion 1 5  Hip extension    Hip abduction    Hip adduction    Hip internal rotation    Hip external rotation    Knee flexion 1 5  Knee extension 1 5  Ankle dorsiflexion 0 5  Ankle plantarflexion    Ankle inversion    Ankle eversion    (Blank rows = not tested)  VITALS  There were no vitals filed for this visit.   TODAY'S TREATMENT:                  TherAct For STG assessment:  [facility] PT Assessment - 10/23/22 1151       Ambulation/Gait   Gait velocity 32.8 ft over 35.79 sec = 0.92 ft/sec      Standardized Balance Assessment   Standardized Balance Assessment Timed Up and Go Test;Five Times Sit to Stand    Five times sit to stand comments  24.25 sec   with LUE support and RW     Timed Up and Go Test   TUG Normal TUG    Normal TUG (seconds) 39.5   with RW             Gait  STAIRS:  Level of Assistance: Min A  Stair Negotiation Technique: Step to Pattern with Bilateral Rails  Number of Stairs: 8 x 2 reps  Height of Stairs: 6  Comments: needs some assist to advance RLE  Pt reports it takes more energy to ascend stairs vs descend stairs  GAIT: Gait pattern:  vaulting with LLE, decreased hip/knee flexion- Right, and decreased  ankle dorsiflexion- Right Distance walked: 115 ft x 2 Assistive device  utilized: Walker - 2 wheeled Level of assistance: Min A Comments: decreased vaulting with LLE though still ongoing; improved RLE clearance with only 2 instances of RLE catching, pt able to stop and take a standing rest break before continuing with gait   NMR Staggered sit to stand with 2" step under LLE to RW, x 5 reps Mod manual assist to increase weight shift to the R during transfer                                                                                                  PATIENT EDUCATION: Education details: continue HEP, continue to monitor BP, results of OM and functional implications Person educated: Patient Education method: Explanation Education comprehension: verbalized understanding and needs further education  HOME EXERCISE PROGRAM: Access Code: [email or web address] URL: [email or web address] Date: 09/27/2022 Prepared by: Peter Congo  Exercises - Hooklying Heel Slide  - 1 x daily - 7 x weekly - 2 sets - 10-15 reps - Supine Bridge  - 1 x daily - 7 x weekly - 2-3 sets - 15 reps - Clamshell  - 1 x daily - 7 x weekly - 2 sets - 10 reps - Sidelying Reverse Clamshell  - 1 x daily - 7 x weekly - 2 sets - 10 reps - Long Sitting Hamstring Stretch  - 1 x daily - 7 x weekly - 1 sets - 3-5 reps - 30 sec hold - Staggered Sit-to-Stand  - 1 x daily - 7 x weekly - 3 sets - 5 reps   GOALS: Goals reviewed with patient? Yes  SHORT TERM GOALS: Target date: 10/23/2022  Pt will be independent with initial outpatient HEP for improved strength, balance, transfers and gait. Baseline: Goal status: MET  2.  Pt will improve 5 x STS to less than or equal to 30 seconds to demonstrate improved functional strength and transfer efficiency.  Baseline: 35.69 sec (7/1), 24.25 sec (7/29) Goal status: MET  3.  Pt will improve normal TUG to less than or equal to 55 seconds for improved functional mobility and decreased fall risk. Baseline: 67 sec with RW (7/1), 39.5 sec with RW  (7/29) Goal status: MET  4.  Pt will improve gait velocity to at least .90 ft/sec for improved gait efficiency and performance at CGA level  Baseline: 0.67 ft/sec with RW and CGA (7/1), 0.92 ft/sec with RW and CGA Goal status: MET  5.  Pt will ambulate x 150 ft with LRAD and CGA to demonstrate improved endurance and increased independence with functional mobility. Baseline: 32.8 ft with RW and CGA, 115 ft with RW and CGA (7/29) Goal status: IN PROGRESS  6.  Pt will initiate stair training as safe and able. Baseline: 8 stairs with 1-2 handrails and min A (7/29) Goal status: MET  LONG TERM GOALS: Target date: 11/23/2022   Pt will be independent with final HEP for improved strength, balance, transfers and gait. Baseline:  Goal status: INITIAL  2.  Pt will improve 5 x STS to less than or equal  to 20 seconds to demonstrate improved functional strength and transfer efficiency.  Baseline: 35.69 sec (7/1), 24.25 sec (7/29) Goal status: REVISED/UPGRADED  3.  Pt will improve normal TUG to less than or equal to 35 seconds for improved functional mobility and decreased fall risk. Baseline: 67 sec with RW (7/1), 39.5 sec with RW (7/29) Goal status: REVISED/UPGRADED  4.  Pt will improve gait velocity to at least 1.25 ft/sec for improved gait efficiency and performance at SBA level  Baseline: 0.67 ft/sec with RW and CGA (7/1), 0.92 ft/sec with RW and CGA Goal status: INITIAL  5.  Pt will ambulate x 200 ft with LRAD and SBA to demonstrate improved endurance and increased independence with functional mobility. Baseline: 32.8 ft with RW and CGA, 115 ft with RW and CGA (7/29) Goal status: REVISED  6.  Pt will ascend/descend 12 stairs with LRAD and min A to demonstrate increased independence with functional mobility. Baseline: not assessed at eval, 8 stairs with 1-2 handrails and min A (7/29) Goal status: INITIAL  ASSESSMENT:  CLINICAL IMPRESSION: Emphasis of skilled PT session on assessing  STG, continuing to work on gait training, and continuing to work on RLE [employer]. Pt has met 5/6 STG due to being independent with his initial HEP, improving his 5xSTS score, gait speed, TUG, and initiating stair training. Pt did not meet goal of ambulating x 150 ft as he is only able to currently ambulate x 115 ft which is still a significant improvement from initial evaluation. Overall pt shows great improvement in his functional LE strength, endurance, balance, and exhibits decreased fall risk. Pt does continue to benefit from skilled therapy services to work towards increasing his safety and independence with functional mobility and to decrease his fall risk. Continue POC.    OBJECTIVE IMPAIRMENTS: Abnormal gait, cardiopulmonary status limiting activity, decreased activity tolerance, decreased balance, decreased coordination, decreased endurance, decreased mobility, difficulty walking, decreased strength, increased edema, impaired sensation, and impaired UE functional use.   ACTIVITY LIMITATIONS: carrying, lifting, bending, standing, squatting, stairs, transfers, bed mobility, bathing, toileting, dressing, reach over head, and hygiene/grooming  PARTICIPATION LIMITATIONS: meal prep, cleaning, laundry, interpersonal relationship, driving, shopping, community activity, and occupation  PERSONAL FACTORS: 1-2 comorbidities:    hypertension, CKD stage IV followed outpatient nephrology are also affecting patient's functional outcome.   REHAB POTENTIAL: Good  CLINICAL DECISION MAKING: Stable/uncomplicated  EVALUATION COMPLEXITY: Moderate  PLAN:  PT FREQUENCY: 2x/week  PT DURATION: 8 weeks  PLANNED INTERVENTIONS: Therapeutic exercises, Therapeutic activity, Neuromuscular re-education, Balance training, Gait training, Patient/Family education, Self Care, Joint mobilization, Stair training, Vestibular training, Canalith repositioning, Visual/preceptual remediation/compensation, Orthotic/Fit training, DME  instructions, Aquatic Therapy, Dry Needling, Wheelchair mobility training, Cryotherapy, Moist heat, Taping, Manual therapy, and Re-evaluation  PLAN FOR NEXT SESSION: check BP, work on RLE NMR and strengthening, gait with RW--needs to work on decreasing vaulting with his LLE when stepping with RLE (could do forward/backward steps at mat or in // bars, sit to stands with step under LLE, work on R HS strengthening, add lift to L side? Standing next to wall to block L hip); has defibrillator so no e-stim    Peter Congo, PT, DPT, CSRS   10/23/2022, 12:30 PM

## 2022-10-26 ENCOUNTER — Ambulatory Visit: Payer: Managed Care, Other (non HMO) | Attending: Physician Assistant | Admitting: Physical Therapy

## 2022-10-26 ENCOUNTER — Ambulatory Visit: Payer: Managed Care, Other (non HMO) | Admitting: Speech Pathology

## 2022-10-26 ENCOUNTER — Ambulatory Visit: Payer: Managed Care, Other (non HMO) | Admitting: Occupational Therapy

## 2022-10-26 VITALS — BP 142/88 | HR 67

## 2022-10-26 DIAGNOSIS — R2681 Unsteadiness on feet: Secondary | ICD-10-CM | POA: Diagnosis present

## 2022-10-26 DIAGNOSIS — R278 Other lack of coordination: Secondary | ICD-10-CM | POA: Insufficient documentation

## 2022-10-26 DIAGNOSIS — M6281 Muscle weakness (generalized): Secondary | ICD-10-CM

## 2022-10-26 DIAGNOSIS — I63522 Cerebral infarction due to unspecified occlusion or stenosis of left anterior cerebral artery: Secondary | ICD-10-CM | POA: Insufficient documentation

## 2022-10-26 DIAGNOSIS — R4701 Aphasia: Secondary | ICD-10-CM

## 2022-10-26 DIAGNOSIS — R2689 Other abnormalities of gait and mobility: Secondary | ICD-10-CM | POA: Diagnosis present

## 2022-10-26 DIAGNOSIS — R29818 Other symptoms and signs involving the nervous system: Secondary | ICD-10-CM | POA: Diagnosis present

## 2022-10-26 DIAGNOSIS — R208 Other disturbances of skin sensation: Secondary | ICD-10-CM

## 2022-10-26 NOTE — Therapy (Signed)
OUTPATIENT SPEECH LANGUAGE PATHOLOGY TREATMENT NOTE   Patient Name: Joseph Bright MRN: [id number] DOB:[birth year], 47 y.o., male Today's Date: 10/26/2022  PCP: Noberto Retort, MD REFERRING PROVIDER: Charlton Amor, PA-C  END OF SESSION:   End of Session - 10/26/22 0937     Visit Number 8    Number of Visits 25    Date for SLP Re-Evaluation 12/18/22    SLP Start Time 0930    SLP Stop Time  1015    SLP Time Calculation (min) 45 min    Activity Tolerance Patient tolerated treatment well;Patient limited by fatigue              Past Medical History:  Diagnosis Date   Hypertension    Stroke [facility])    Past Surgical History:  Procedure Laterality Date   IR CT HEAD LTD  08/07/2022   IR INTRA CRAN STENT  08/07/2022   IR PERCUTANEOUS ART THROMBECTOMY/INFUSION INTRACRANIAL INC DIAG ANGIO  07/25/2022   LOOP RECORDER INSERTION N/A 07/28/2022   Procedure: LOOP RECORDER INSERTION;  Surgeon: Regan Lemming, MD;  Location: [facility] INVASIVE CV LAB;  Service: Cardiovascular;  Laterality: N/A;   RADIOLOGY WITH ANESTHESIA N/A 07/25/2022   Procedure: RADIOLOGY WITH ANESTHESIA;  Surgeon: Radiologist, Medication, MD;  Location: [facility] OR;  Service: Radiology;  Laterality: N/A;   TEE WITHOUT CARDIOVERSION N/A 07/28/2022   Procedure: TRANSESOPHAGEAL ECHOCARDIOGRAM;  Surgeon: Parke Poisson, MD;  Location: [facility] INVASIVE CV LAB;  Service: Cardiovascular;  Laterality: N/A;   Patient Active Problem List   Diagnosis Date Noted   Loud snoring 10/18/2022   Adjustment disorder with mixed anxiety and depressed mood 08/07/2022   Cerebrovascular accident (CVA) due to occlusion of left posterior communicating artery (HCC) 08/01/2022   Acute ischemic left ACA stroke (HCC) 07/25/2022   Arterial ischemic stroke, ACA (anterior cerebral artery), left, acute (HCC) 07/25/2022   DYSLIPIDEMIA 04/24/2007   RENAL FAILURE NOS 11/29/2006   Essential hypertension 11/12/2006    ONSET DATE: 07/25/22  REFERRING DIAG:  I61.9 (ICD-10-CM) - CVA (cerebrovascular accident due to intracerebral hemorrhage) (HCC)  THERAPY DIAG:  Aphasia  Rationale for Evaluation and Treatment: Rehabilitation  SUBJECTIVE:   SUBJECTIVE STATEMENT: "been going well"   PAIN: Are you having pain? No  OBJECTIVE:   TODAY'S TREATMENT:                                                                                                                                         DATE:   10/26/22: Addressed word finding with divergent naming task, with pt demonstrating ability to generate x23 words IND, additional x13 with mod semantic cues. Led pt through structured conversation to address use of word finding strategies and discourse cohesion strategies at conversational level. Cues given for stopping and thinking prior to responding to aid in cohesiveness. Occasional cues needed for use of anomia strategies. Pt with frequent fillers in conversation,  mazing and false starts. Reduces when pt allows self time to formulate response prior to speaking.   10/23/22: Pt more alert during today's session, and is now coming to sessions alone because spouse has gone back to work. Pt able to name 10 words within a category during warm up activity. SLP targeted word finding with scattergories. Patient able to generate words in a given category with 40% accuracy. Required frequent mod-A of semantic and phonemic cues as well as immediate feedback to generate more responses. Patient states that word finding is going better at home, and that he is implementing the practice of semantic feature analysis, which has helped.     PATIENT EDUCATION: Education details: See Today's Treatment; aphasia ed, compensatory strategies, see patient instructions Person educated: Patient Education method: Explanation, Demonstration, Verbal cues, and Handouts Education comprehension: verbal cues required and needs further education     GOALS: Goals reviewed with patient? Yes    SHORT TERM GOALS: Target date: 10/23/22   Pt will name 15 items in personally relevant category with rare min A Baseline: 9 items Goal status: MET   2.  Pt will use verbal compensations 4/5 word finding episodes with rare min A Baseline: 1/5 Goal status: MET   3.  Pt will generate complex sentences in structured tasks 4/5x with occasional min A Baseline: no complex sentences Goal status: ONGOING   4.  Pt will carry over 3 strategies to compensate for auditory comprehension Baseline: No strategies Goal status: MET   LONG TERM GOALS: Target date: 12/18/22   Pt will use verbal compensations for aphasia 5/5x in conversation with rare min A Baseline:  Goal status: INITIAL   2.  Pt will generate complex sentences in simple conversation 4/5 turns with occasional min A Baseline: simple sentences only Goal status: INITIAL   3.  Pt will use compensatory strategies to gain a turn in group or fast moving conversation 3x outside of therapy Baseline: no strategy for this Goal status: INITIAL   4.  Pt will reduce fillers of "um" and "ah" to 5 or less over 5 sentences Baseline: 15+ fillers over 3 sentence narrative Goal status: INITIAL   ASSESSMENT:   CLINICAL IMPRESSION: Patient is a 47 y.o. male who was seen today for moderate aphasia characterized by halting, fillers due to anomia. Taisei continues to improve with word finding in conversation as well as structured naming tasks. He is using verbal compensations for aphasia successfully and reports these strategies are also improving his word finding. Aphasia improved to mild to moderate. Auditory comprehension continues to improve for moderately complex conversation, however he continues to benefit from family being present to manage medical, legal and financial information/conversations. I recommend skilled ST to maximize communication for safety, independence, and return to PLOF.    OBJECTIVE IMPAIRMENTS: include aphasia. These  impairments are limiting patient from return to work, ADLs/IADLs, and effectively communicating at home and in community. Factors affecting potential to achieve goals and functional outcome are financial resources. Patient will benefit from skilled SLP services to address above impairments and improve overall function.   REHAB POTENTIAL: Good   PLAN:   SLP FREQUENCY: 2x/week   SLP DURATION: 12 weeks   PLANNED INTERVENTIONS: Language facilitation, Environmental controls, Cueing hierachy, Cognitive reorganization, Internal/external aids, Functional tasks, Multimodal communication approach, and SLP instruction and feedback       Maia Breslow, CCC-SLP 10/26/2022, 9:38 AM

## 2022-10-26 NOTE — Therapy (Signed)
OUTPATIENT OCCUPATIONAL THERAPY NEURO TREATMENT  Patient Name: Joseph Bright MRN: [id number] DOB:10-Jun-1975, 47 y.o., male Today's Date: 10/26/2022  PCP: Noberto Retort, MD  REFERRING PROVIDER: Charlton Amor, PA-C   END OF SESSION:  OT End of Session - 10/26/22 1103     Visit Number 8    Number of Visits 17    Date for OT Re-Evaluation 11/20/22   Date adjusted as pt was not wanting to schedue visits the week of 09/18/2022   Authorization Type [employer] (60 VL - OT, PT, ST)    OT Start Time 1107    OT Stop Time 1145    OT Time Calculation (min) 38 min    Activity Tolerance Patient tolerated treatment well    Behavior During Therapy WFL for tasks assessed/performed             Past Medical History:  Diagnosis Date   Hypertension    Stroke [facility])    Past Surgical History:  Procedure Laterality Date   IR CT HEAD LTD  08/07/2022   IR INTRA CRAN STENT  08/07/2022   IR PERCUTANEOUS ART THROMBECTOMY/INFUSION INTRACRANIAL INC DIAG ANGIO  07/25/2022   LOOP RECORDER INSERTION N/A 07/28/2022   Procedure: LOOP RECORDER INSERTION;  Surgeon: Regan Lemming, MD;  Location: [facility] INVASIVE CV LAB;  Service: Cardiovascular;  Laterality: N/A;   RADIOLOGY WITH ANESTHESIA N/A 07/25/2022   Procedure: RADIOLOGY WITH ANESTHESIA;  Surgeon: Radiologist, Medication, MD;  Location: [facility] OR;  Service: Radiology;  Laterality: N/A;   TEE WITHOUT CARDIOVERSION N/A 07/28/2022   Procedure: TRANSESOPHAGEAL ECHOCARDIOGRAM;  Surgeon: Parke Poisson, MD;  [facility]: [facility] INVASIVE CV LAB;  Service: Cardiovascular;  Laterality: N/A;   Patient Active Problem List   Diagnosis Date Noted   Loud snoring 10/18/2022   Adjustment disorder with mixed anxiety and depressed mood 08/07/2022   Cerebrovascular accident (CVA) due to occlusion of left posterior communicating artery (HCC) 08/01/2022   Acute ischemic left ACA stroke (HCC) 07/25/2022   Arterial ischemic stroke, ACA (anterior cerebral artery), left, acute (HCC)  07/25/2022   DYSLIPIDEMIA 04/24/2007   RENAL FAILURE NOS 11/29/2006   Essential hypertension 11/12/2006    ONSET DATE: 07/25/2022  REFERRING DIAG: I61.9 (ICD-10-CM) - CVA (cerebrovascular accident due to intracerebral hemorrhage)   THERAPY DIAG:  Muscle weakness (generalized)  Other lack of coordination  Other disturbances of skin sensation  Other symptoms and signs involving the nervous system  Rationale for Evaluation and Treatment: Rehabilitation  SUBJECTIVE:   SUBJECTIVE STATEMENT: Pt states he has pain in R shoulder especially raising past 90* in scaption.   Pt accompanied by: Self  PERTINENT HISTORY: Pt presented to ED 07/25/2022. "Cranial CT scan negative for acute changes. There was chronic appearing right thalamic lacunar infarct. 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 Status post TNK with interventional radiology for stenting. MRI follow-up showed confluent left ACA territory infarction. Cytotoxic edema with no hemorrhagic transformation. Multiple additional scattered mostly punctate ischemic foci in the left greater than right MCA, PCA and occasionally cerebellar artery territories."  Pt in IRF from 08/01/2022 -08/31/2022 then discharged to OP OT, PT, and ST.   PRECAUTIONS: Fall  WEIGHT BEARING RESTRICTIONS: No  PAIN:  Are you having pain? Yes - Rt shoulder 0-3/10 w/ higher level reaching only (scaption)  FALLS: Has patient fallen in last 6 months? No  LIVING ENVIRONMENT: Lives with: lives with their family Lives in: House/apartment Stairs: No Has following equipment at home: Dan Humphreys -  2 wheeled, Environmental consultant - 4 wheeled, Wheelchair (manual), bed side commode, and Grab bars  PLOF: Independent; full-time working for [employer]; driving; sports (football and basketball)  PATIENT GOALS: return to [employer]  OBJECTIVE:   HAND DOMINANCE: Right  ADLs: Overall ADLs: min A  IADLs: Shopping: dependent Light housekeeping:  dependent Community mobility: dependent  Handwriting: Not legible  MOBILITY STATUS: Needs Assist: manual wheelchair  ACTIVITY TOLERANCE: Activity tolerance: fair  FUNCTIONAL OUTCOME MEASURES: FOTO: Slight: 59 10/23/2022: 79   UPPER EXTREMITY ROM:     AROM Right (eval) Left (eval)  Shoulder flexion WNL WNL  Shoulder abduction WNL WNL  Elbow flexion WNL WNL  Elbow extension WNL WNL  Wrist flexion WNL WNL  Wrist extension WNL WNL  Wrist pronation WNL WNL  Wrist supination WNL WNL   Digit Composite Flexion WNL WNL  Digit Composite Extension WNL WNL  Digit Opposition WNL WNL  (Blank rows = not tested)  RUE: IR to hip; overall bradykinesia with tremor  UPPER EXTREMITY MMT:     MMT Right (eval) Left (eval)  Shoulder flexion 4/5 WNL  Shoulder abduction 4/5 WNL  Elbow flexion 4/5 WNL  Elbow extension 4/5 WNL  (Blank rows = not tested)  HAND FUNCTION: Grip strength: Right: 38.8 lbs; Left: 82.8 lbs 10/23/2022: 57.5 lbs  COORDINATION: 9 Hole Peg test: Right: 139 sec; Left: 32 sec 10/23/2022: 59 seconds  SENSATION: Paresthesias reported in R fingertips  EDEMA: none reported or observed  MUSCLE TONE: WFL  COGNITION: Overall cognitive status: Within functional limits for tasks assessed  VISION: Subjective report: no changes Baseline vision: Wears glasses for reading only Visual history:  none  VISION ASSESSMENT: WFL  PERCEPTION: WFL  PRAXIS: WFL  OBSERVATIONS: Pt appears well-kept. He is able to propel w/c with increased time. Has RW with him.   TODAY'S TREATMENT:             - Neuro re-education completed for duration as noted below including: OT reviewed R shoulder HEP as noted in pt instructions for improved ROM and proprioceptive input.   Pt completed caterpillar bead puzzles x 1 patterns collecting the 5 colored beads needed for the puzzle in hand, then using in hand manipulation to transition bead to tips of thumb and index finger before placing  into board for improved right hand strength and fine motor coordination. Pt then collected 2-3 at a time x4 patterns and then used tongs x3 patterns for improved R fine motor coordination.   - Therapeutic activities completed for duration as noted below including:  Therapist and patient participated in 1 game of Skip-Bo requiring patient to shuffle, deal, hold cards, play cards onto table, and turn cards over with R, affected hand for improved coordination following CVA.   Pt completed shoe tying placing LLE on foot rest to limit required reach to allow more consistent and incorporated use of RUE  PATIENT EDUCATION: Education details: R coordination  Person educated: Patient Education method: Explanation, Demonstration, Tactile cues, Verbal cues, and Handouts Education comprehension: verbalized understanding, returned demonstration, verbal cues required, and needs further education  HOME EXERCISE PROGRAM: 09/12/2022: Chilton Si putty and coordination HEPs 09/27/22: Additional coordination HEP  10/04/22: shoulder girdle/scapula strengthening HEP  10/23/2022: Shoulder slides and alphabet  GOALS:  SHORT TERM GOALS: Target date: 10/10/2022    Patient will demonstrate independence with initial RUE HEP. Baseline: Goal status: MET  2.  Patient will demonstrate at least 45 lbs R grip strength as needed to open jars and other containers. Baseline:  38.8 lbs 10/23/2022: 57.5 lbs Goal status: MET  3.  Pt will report no difficulty holding a dish while washing it.  Baseline: with some difficulty 10/23/2022: has not tried recently; simulated in clinic: little difficulty Goal status: IN PROGRESS   LONG TERM GOALS: Target date: 11/17/2022    Patient will demonstrate updated RUE HEP with 25% verbal cues or less for proper execution. Baseline:  Goal status: IN PROGRESS  2.  Patient will demonstrate at least 50 lbs R grip strength as needed to open jars and other containers. Baseline: 38.8  lbs 10/23/2022: 57.5 lbs Goal status: MET  3.  Patient will complete nine-hole peg with use of R in 45 seconds or less. Baseline: 139 seconds 10/23/2022: 59 seconds; GOAL UPDATED (< or = 60 seconds originally) Goal status: REVISED  4.  Pt will complete d/c FOTO.  Baseline: 59 10/23/2022: 79 Goal status: INITIAL   ASSESSMENT:  CLINICAL IMPRESSION: Pt limited by R forearm supination and reach as needed to complete tasks today but was able to manage with modifications and repetition.   PERFORMANCE DEFICITS: in functional skills including ADLs, IADLs, coordination, dexterity, strength, Fine motor control, Gross motor control, mobility, endurance, and UE functional use  IMPAIRMENTS: are limiting patient from ADLs, IADLs, work, and leisure.   CO-MORBIDITIES: may have co-morbidities  that affects occupational performance. Patient will benefit from skilled OT to address above impairments and improve overall function.  REHAB POTENTIAL: Good   PLAN:  OT FREQUENCY: 2x/week  OT DURATION: 8 weeks  PLANNED INTERVENTIONS: self care/ADL training, therapeutic exercise, therapeutic activity, neuromuscular re-education, manual therapy, functional mobility training, splinting, ultrasound, fluidotherapy, patient/family education, DME and/or AE instructions, and Re-evaluation  RECOMMENDED OTHER SERVICES: Receiving PT and ST  CONSULTED AND AGREED WITH PLAN OF CARE: Patient and family member/caregiver  PLAN FOR NEXT SESSION: Weight-bearing through RUE; Skip-BO   Delana Meyer, OT 10/26/2022, 1:11 PM

## 2022-10-26 NOTE — Therapy (Signed)
OUTPATIENT PHYSICAL THERAPY NEURO TREATMENT   Patient Name: Joseph Bright MRN: [id number] DOB:Jun 07, 1975, 47 y.o., male Today's Date: 10/26/2022   PCP: Noberto Retort, MD REFERRING PROVIDER: Charlton Amor, PA-C  END OF SESSION:  PT End of Session - 10/26/22 1017     Visit Number 8    Number of Visits 17   with eval   Date for PT Re-Evaluation 12/18/22    Authorization Type [employer]    PT Start Time 1017    PT Stop Time 1100    PT Time Calculation (min) 43 min    Equipment Utilized During Treatment Gait belt;Other (comment)   R toe cap   Activity Tolerance Patient tolerated treatment well    Behavior During Therapy [facility] for tasks assessed/performed                    Past Medical History:  Diagnosis Date   Hypertension    Stroke [facility])    Past Surgical History:  Procedure Laterality Date   IR CT HEAD LTD  08/07/2022   IR INTRA CRAN STENT  08/07/2022   IR PERCUTANEOUS ART THROMBECTOMY/INFUSION INTRACRANIAL INC DIAG ANGIO  07/25/2022   LOOP RECORDER INSERTION N/A 07/28/2022   Procedure: LOOP RECORDER INSERTION;  Surgeon: Regan Lemming, MD;  Location: [facility] INVASIVE CV LAB;  Service: Cardiovascular;  Laterality: N/A;   RADIOLOGY WITH ANESTHESIA N/A 07/25/2022   Procedure: RADIOLOGY WITH ANESTHESIA;  Surgeon: Radiologist, Medication, MD;  Location: [facility] OR;  Service: Radiology;  Laterality: N/A;   TEE WITHOUT CARDIOVERSION N/A 07/28/2022   Procedure: TRANSESOPHAGEAL ECHOCARDIOGRAM;  Surgeon: Parke Poisson, MD;  Location: [facility] INVASIVE CV LAB;  Service: Cardiovascular;  Laterality: N/A;   Patient Active Problem List   Diagnosis Date Noted   Loud snoring 10/18/2022   Adjustment disorder with mixed anxiety and depressed mood 08/07/2022   Cerebrovascular accident (CVA) due to occlusion of left posterior communicating artery (HCC) 08/01/2022   Acute ischemic left ACA stroke (HCC) 07/25/2022   Arterial ischemic stroke, ACA (anterior cerebral artery), left, acute  (HCC) 07/25/2022   DYSLIPIDEMIA 04/24/2007   RENAL FAILURE NOS 11/29/2006   Essential hypertension 11/12/2006    ONSET DATE: 08/24/2022 (referral date)  REFERRING DIAG: I63.9 (ICD-10-CM) - CVA (cerebral vascular accident) (HCC)  THERAPY DIAG:  Muscle weakness (generalized)  Unsteadiness on feet  Other abnormalities of gait and mobility  Rationale for Evaluation and Treatment: Rehabilitation  SUBJECTIVE:  SUBJECTIVE STATEMENT: Pt denies any pain today, no acute changes since last visit. Pt was tired after last visit. Pt having spasticity in his RLE while sleeping, will wake him up at night.  Pt accompanied by: self; driven by ex wife and caregiver [name] ("Tab")  PERTINENT HISTORY:  hypertension, CKD stage IV followed by outpatient nephrology  PAIN:  Are you having pain? No  PRECAUTIONS: Fall and ICD/Pacemaker  WEIGHT BEARING RESTRICTIONS: No  FALLS: Has patient fallen in last 6 months? Yes. Number of falls 1, fell last weekend at home trying to do something by himself, he bumped his head, his ex-wife helped him to get back up after the fall  LIVING ENVIRONMENT: Lives with:  lives with ex-wife Lives in: House/apartment Stairs: No Has following equipment at home: Environmental consultant - 2 wheeled, Wheelchair (manual), shower chair, and bed side commode  PLOF: Needs assistance with ADLs, Needs assistance with homemaking, Needs assistance with gait, and Needs assistance with transfers  PATIENT GOALS: "to walk"  OBJECTIVE:   DIAGNOSTIC FINDINGS:  Brain MRI 07/26/22 IMPRESSION: 1. Confluent Left ACA territory infarct. Cytotoxic edema with no hemorrhagic transformation.   2. Multiple additional scattered mostly punctate ischemic foci in the left greater than right MCA, PCA, and occasionally  cerebellar artery territories. No significant intracranial mass effect.   3. 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   4. See intracranial MRA is reported separately today.    COGNITION: Overall cognitive status: Within functional limits for tasks assessed   SENSATION: N/T in R hemibody, impaired sensation though light touch appears intact  EDEMA:  Swelling in RLE/R foot  POSTURE: rounded shoulders, forward head, and posterior pelvic tilt   LOWER EXTREMITY MMT:    MMT Right Eval Left Eval  Hip flexion 1 5  Hip extension    Hip abduction    Hip adduction    Hip internal rotation    Hip external rotation    Knee flexion 1 5  Knee extension 1 5  Ankle dorsiflexion 0 5  Ankle plantarflexion    Ankle inversion    Ankle eversion    (Blank rows = not tested)  VITALS  Vitals:   10/26/22 1020  BP: (!) 142/88  Pulse: 67     TODAY'S TREATMENT:                   Gait Gait pattern:  vaulting LLE but decreased from previous sessions, decreased hip/knee flexion- Right, and decreased ankle dorsiflexion- Right Distance walked: 115 ft x 2 Assistive device utilized: Walker - 2 wheeled Level of assistance: CGA Comments: improved RLE clearance until onset of fatigue near end of the lap; ongoing LLE vaulting but decreased from previous sessions   NMR Staggered sit to stand with 2" step under LLE to RW, 2 x 7 reps Use of mirror for visual feedback for hip positioning to maintain WB through RLE Min A needed to increase weight shift to R side   Staggered stance with 2" step under LLE with RW and CGA for support: Forward/backward stepping with RLE with min A to advance limb, no vaulting on LLE due to elevation 2 x 10 reps Hip abd 2 x 10 reps on 2" step Forward/backward stepping with RLE with no step under LLE x 10 reps with min A to advance limb  PATIENT EDUCATION: Education details: continue HEP, continue to monitor BP Person educated: Patient Education method: Explanation Education comprehension: verbalized understanding and needs further education  HOME EXERCISE PROGRAM: Access Code: [email or web address] URL: [email or web address] Date: 09/27/2022 Prepared by: Peter Congo  Exercises - Hooklying Heel Slide  - 1 x daily - 7 x weekly - 2 sets - 10-15 reps - Supine Bridge  - 1 x daily - 7 x weekly - 2-3 sets - 15 reps - Clamshell  - 1 x daily - 7 x weekly - 2 sets - 10 reps - Sidelying Reverse Clamshell  - 1 x daily - 7 x weekly - 2 sets - 10 reps - Long Sitting Hamstring Stretch  - 1 x daily - 7 x weekly - 1 sets - 3-5 reps - 30 sec hold - Staggered Sit-to-Stand  - 1 x daily - 7 x weekly - 3 sets - 5 reps   GOALS: Goals reviewed with patient? Yes  SHORT TERM GOALS: Target date: 10/23/2022  Pt will be independent with initial outpatient HEP for improved strength, balance, transfers and gait. Baseline: Goal status: MET  2.  Pt will improve 5 x STS to less than or equal to 30 seconds to demonstrate improved functional strength and transfer efficiency.  Baseline: 35.69 sec (7/1), 24.25 sec (7/29) Goal status: MET  3.  Pt will improve normal TUG to less than or equal to 55 seconds for improved functional mobility and decreased fall risk. Baseline: 67 sec with RW (7/1), 39.5 sec with RW (7/29) Goal status: MET  4.  Pt will improve gait velocity to at least .90 ft/sec for improved gait efficiency and performance at CGA level  Baseline: 0.67 ft/sec with RW and CGA (7/1), 0.92 ft/sec with RW and CGA Goal status: MET  5.  Pt will ambulate x 150 ft with LRAD and CGA to demonstrate improved endurance and increased independence with functional mobility. Baseline: 32.8 ft with RW and CGA, 115 ft with RW and CGA (7/29) Goal status: IN PROGRESS  6.  Pt will initiate stair training as safe and able. Baseline: 8  stairs with 1-2 handrails and min A (7/29) Goal status: MET  LONG TERM GOALS: Target date: 11/23/2022   Pt will be independent with final HEP for improved strength, balance, transfers and gait. Baseline:  Goal status: INITIAL  2.  Pt will improve 5 x STS to less than or equal to 20 seconds to demonstrate improved functional strength and transfer efficiency.  Baseline: 35.69 sec (7/1), 24.25 sec (7/29) Goal status: REVISED/UPGRADED  3.  Pt will improve normal TUG to less than or equal to 35 seconds for improved functional mobility and decreased fall risk. Baseline: 67 sec with RW (7/1), 39.5 sec with RW (7/29) Goal status: REVISED/UPGRADED  4.  Pt will improve gait velocity to at least 1.25 ft/sec for improved gait efficiency and performance at SBA level  Baseline: 0.67 ft/sec with RW and CGA (7/1), 0.92 ft/sec with RW and CGA Goal status: INITIAL  5.  Pt will ambulate x 200 ft with LRAD and SBA to demonstrate improved endurance and increased independence with functional mobility. Baseline: 32.8 ft with RW and CGA, 115 ft with RW and CGA (7/29) Goal status: REVISED  6.  Pt will ascend/descend 12 stairs with LRAD and min A to demonstrate increased independence with functional mobility. Baseline: not assessed at eval, 8 stairs with 1-2 handrails and min A (7/29) Goal status: INITIAL  ASSESSMENT:  CLINICAL IMPRESSION: Emphasis of skilled PT  session on continuing to work on RLE NMR, increasing weight shift to the R with sit to stand transfers and in standing, and working on decreased vaulting with LLE during gait. Pt exhibits improved ability to weight shift to R side with sit to stand transfers with use of mirror for visual feedback and min manual assist. Pt also exhibits improved ability to advance RLE during gait more independently with decreased physical assist needed. Pt also exhibits decreased vaulting with LLE during gait as compared to previous sessions though it is still ongoing.  Pt exhibits improved endurance this session with ability to ambulate 2 x 115 ft at end of session when more fatigued. Pt continues to benefit from skilled therapy services to work towards increasing RLE strength and control, improving balance, and in order to increase safety and independence with functional mobility. Continue POC.    OBJECTIVE IMPAIRMENTS: Abnormal gait, cardiopulmonary status limiting activity, decreased activity tolerance, decreased balance, decreased coordination, decreased endurance, decreased mobility, difficulty walking, decreased strength, increased edema, impaired sensation, and impaired UE functional use.   ACTIVITY LIMITATIONS: carrying, lifting, bending, standing, squatting, stairs, transfers, bed mobility, bathing, toileting, dressing, reach over head, and hygiene/grooming  PARTICIPATION LIMITATIONS: meal prep, cleaning, laundry, interpersonal relationship, driving, shopping, community activity, and occupation  PERSONAL FACTORS: 1-2 comorbidities:    hypertension, CKD stage IV followed outpatient nephrology are also affecting patient's functional outcome.   REHAB POTENTIAL: Good  CLINICAL DECISION MAKING: Stable/uncomplicated  EVALUATION COMPLEXITY: Moderate  PLAN:  PT FREQUENCY: 2x/week  PT DURATION: 8 weeks  PLANNED INTERVENTIONS: Therapeutic exercises, Therapeutic activity, Neuromuscular re-education, Balance training, Gait training, Patient/Family education, Self Care, Joint mobilization, Stair training, Vestibular training, Canalith repositioning, Visual/preceptual remediation/compensation, Orthotic/Fit training, DME instructions, Aquatic Therapy, Dry Needling, Wheelchair mobility training, Cryotherapy, Moist heat, Taping, Manual therapy, and Re-evaluation  PLAN FOR NEXT SESSION: check BP, work on RLE NMR and strengthening, gait with RW--needs to work on decreasing vaulting with his LLE when stepping with RLE (could do forward/backward steps at mat or in //  bars, sit to stands with step under LLE, work on R HS strengthening, add lift to L side? Standing next to wall to block L hip); has defibrillator so no e-stim    Peter Congo, PT, DPT, CSRS   10/26/2022, 11:03 AM

## 2022-10-30 ENCOUNTER — Encounter: Payer: Self-pay | Admitting: Speech Pathology

## 2022-10-30 ENCOUNTER — Ambulatory Visit: Payer: Managed Care, Other (non HMO) | Admitting: Speech Pathology

## 2022-10-30 ENCOUNTER — Ambulatory Visit: Payer: Managed Care, Other (non HMO) | Admitting: Occupational Therapy

## 2022-10-30 ENCOUNTER — Ambulatory Visit: Payer: Managed Care, Other (non HMO) | Admitting: Physical Therapy

## 2022-10-30 VITALS — BP 139/80 | HR 76

## 2022-10-30 DIAGNOSIS — R4701 Aphasia: Secondary | ICD-10-CM

## 2022-10-30 DIAGNOSIS — M6281 Muscle weakness (generalized): Secondary | ICD-10-CM

## 2022-10-30 DIAGNOSIS — R2681 Unsteadiness on feet: Secondary | ICD-10-CM

## 2022-10-30 DIAGNOSIS — R278 Other lack of coordination: Secondary | ICD-10-CM

## 2022-10-30 DIAGNOSIS — R2689 Other abnormalities of gait and mobility: Secondary | ICD-10-CM

## 2022-10-30 NOTE — Patient Instructions (Addendum)
   [facility] - Cynda Familia [phone]  You can call or text if you are interested  Neuro nerds pod cast  On IG:  Neuro nerds  [place]  [place]  Neuro[employer] Podcast Recovery After Stroke Podcast [facility] Stroke Onward  Find on [employer] Young Stroke Survivor   [facility] transportation - maybe sign up in case you need it  [facility] - maybe reach out and see if you can benefit from their transport or services  Look into exercise programs for post stroke   Dr. Kieth Brightly is a neuropyschologist who specializes in post stroke counseling - he has a long wait list, so if you get interested, know it may take 6 months or so to get in  Start thinking about how you want life to look after you graduate therapy - considering exercise or [employer], social groups, church, fraternity, etc   You will need to self advocate on what you need - if you need a smaller group, or someone who can take you home early if you get over whelmed  You may be more sensitive to environmental noises at large gatherings, restaurants or stores - stick up for yourself if it's too much  You look great - people will forget that you've had a stroke  You will have to tell people that you have aphasia and need more time to talk - hold on, I'm not done, use you hand to get a turn

## 2022-10-30 NOTE — Therapy (Unsigned)
OUTPATIENT OCCUPATIONAL THERAPY NEURO TREATMENT  Patient Name: Joseph Bright MRN: [id number] DOB:10-04-1975, 47 y.o., male Today's Date: 10/30/2022  PCP: Noberto Retort, MD  REFERRING PROVIDER: Charlton Amor, PA-C   END OF SESSION:  OT End of Session - 10/30/22 1105     Visit Number 9    Number of Visits 17    Date for OT Re-Evaluation 11/20/22   Date adjusted as pt was not wanting to schedue visits the week of 09/18/2022   Authorization Type [employer] (60 VL - OT, PT, ST)    OT Start Time 1105    OT Stop Time 1146    OT Time Calculation (min) 41 min    Activity Tolerance Patient tolerated treatment well    Behavior During Therapy [facility] for tasks assessed/performed             Past Medical History:  Diagnosis Date   Hypertension    Stroke [facility])    Past Surgical History:  Procedure Laterality Date   IR CT HEAD LTD  08/07/2022   IR INTRA CRAN STENT  08/07/2022   IR PERCUTANEOUS ART THROMBECTOMY/INFUSION INTRACRANIAL INC DIAG ANGIO  07/25/2022   LOOP RECORDER INSERTION N/A 07/28/2022   Procedure: LOOP RECORDER INSERTION;  Surgeon: Regan Lemming, MD;  Location: [facility] INVASIVE CV LAB;  Service: Cardiovascular;  Laterality: N/A;   RADIOLOGY WITH ANESTHESIA N/A 07/25/2022   Procedure: RADIOLOGY WITH ANESTHESIA;  Surgeon: Radiologist, Medication, MD;  Location: [facility] OR;  Service: Radiology;  Laterality: N/A;   TEE WITHOUT CARDIOVERSION N/A 07/28/2022   Procedure: TRANSESOPHAGEAL ECHOCARDIOGRAM;  Surgeon: Parke Poisson, MD;  Location: [facility] INVASIVE CV LAB;  Service: Cardiovascular;  Laterality: N/A;   Patient Active Problem List   Diagnosis Date Noted   Loud snoring 10/18/2022   Adjustment disorder with mixed anxiety and depressed mood 08/07/2022   Cerebrovascular accident (CVA) due to occlusion of left posterior communicating artery (HCC) 08/01/2022   Acute ischemic left ACA stroke (HCC) 07/25/2022   Arterial ischemic stroke, ACA (anterior cerebral artery), left, acute (HCC)  07/25/2022   DYSLIPIDEMIA 04/24/2007   RENAL FAILURE NOS 11/29/2006   Essential hypertension 11/12/2006    ONSET DATE: 07/25/2022  REFERRING DIAG: I61.9 (ICD-10-CM) - CVA (cerebrovascular accident due to intracerebral hemorrhage)   THERAPY DIAG:  Muscle weakness (generalized)  Other lack of coordination  Rationale for Evaluation and Treatment: Rehabilitation  SUBJECTIVE:   SUBJECTIVE STATEMENT: He enjoyed playing Skip-Bo last session. He has difficulty managing his RLE when trying to get in and out of bed. He doesn't have his wife around as much to help him as she has returned to work.   Pt accompanied by: Self  PERTINENT HISTORY: Pt presented to ED 07/25/2022. "Cranial CT scan negative for acute changes. There was chronic appearing right thalamic lacunar infarct. 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 Status post TNK with interventional radiology for stenting. MRI follow-up showed confluent left ACA territory infarction. Cytotoxic edema with no hemorrhagic transformation. Multiple additional scattered mostly punctate ischemic foci in the left greater than right MCA, PCA and occasionally cerebellar artery territories."  Pt in IRF from 08/01/2022 -08/31/2022 then discharged to OP OT, PT, and ST.   PRECAUTIONS: Fall  WEIGHT BEARING RESTRICTIONS: No  PAIN:  Are you having pain? Yes - Rt shoulder 0-3/10 w/ higher level reaching only (scaption)  FALLS: Has patient fallen in last 6 months? No  LIVING ENVIRONMENT: Lives with: lives with their family Lives in:  House/apartment Stairs: No Has following equipment at home: Dan Humphreys - 2 wheeled, Environmental consultant - 4 wheeled, Wheelchair (manual), bed side commode, and Grab bars  PLOF: Independent; full-time working for [employer]; driving; sports (football and basketball)  PATIENT GOALS: return to [employer]  OBJECTIVE:   HAND DOMINANCE: Right  ADLs: Overall ADLs: min A  IADLs: Shopping: dependent Light  housekeeping: dependent Community mobility: dependent  Handwriting: Not legible  MOBILITY STATUS: Needs Assist: manual wheelchair  ACTIVITY TOLERANCE: Activity tolerance: fair  FUNCTIONAL OUTCOME MEASURES: FOTO: Slight: 59 10/23/2022: 79   UPPER EXTREMITY ROM:     AROM Right (eval) Left (eval)  Shoulder flexion WNL WNL  Shoulder abduction WNL WNL  Elbow flexion WNL WNL  Elbow extension WNL WNL  Wrist flexion WNL WNL  Wrist extension WNL WNL  Wrist pronation WNL WNL  Wrist supination WNL WNL   Digit Composite Flexion WNL WNL  Digit Composite Extension WNL WNL  Digit Opposition WNL WNL  (Blank rows = not tested)  RUE: IR to hip; overall bradykinesia with tremor  UPPER EXTREMITY MMT:     MMT Right (eval) Left (eval)  Shoulder flexion 4/5 WNL  Shoulder abduction 4/5 WNL  Elbow flexion 4/5 WNL  Elbow extension 4/5 WNL  (Blank rows = not tested)  HAND FUNCTION: Grip strength: Right: 38.8 lbs; Left: 82.8 lbs 10/23/2022: 57.5 lbs  COORDINATION: 9 Hole Peg test: Right: 139 sec; Left: 32 sec 10/23/2022: 59 seconds  SENSATION: Paresthesias reported in R fingertips  EDEMA: none reported or observed  MUSCLE TONE: WFL  COGNITION: Overall cognitive status: Within functional limits for tasks assessed  VISION: Subjective report: no changes Baseline vision: Wears glasses for reading only Visual history:  none  VISION ASSESSMENT: WFL  PERCEPTION: WFL  PRAXIS: WFL  OBSERVATIONS: Pt appears well-kept. He is able to propel w/c with increased time. Has RW with him.   TODAY'S TREATMENT:             - Therapeutic activities completed for duration as noted below including:  Therapist and patient participated in 2 games of Skip-Bo requiring patient to shuffle, deal, hold cards, play cards onto table, and turn cards over with R, affected hand for improved coordination following CVA. For 2nd game, pt stood and weight-beared through RUE (on wedge) as he held cards in  this hand for improved proprioceptive input and grip strength. Cues to lean fully into RUE. Pt sitting midway through due to tolerance but then weight-beared through RUE with arm resting on table.   In hand manipulation of checkers to pick up, store, transition, and shift checkers to tips of L thumb and index finger in order as called out by therapist for improved fine motor coordination.   PATIENT EDUCATION: Education details: R coordination; standing endurance; weightbearing  Person educated: Patient Education method: Explanation, Demonstration, Tactile cues, Verbal cues, and Handouts Education comprehension: verbalized understanding, returned demonstration, verbal cues required, and needs further education  HOME EXERCISE PROGRAM: 09/12/2022: Chilton Si putty and coordination HEPs 09/27/22: Additional coordination HEP  10/04/22: shoulder girdle/scapula strengthening HEP  10/23/2022: Shoulder slides and alphabet  GOALS:  SHORT TERM GOALS: Target date: 10/10/2022    Patient will demonstrate independence with initial RUE HEP. Baseline: Goal status: MET  2.  Patient will demonstrate at least 45 lbs R grip strength as needed to open jars and other containers. Baseline: 38.8 lbs 10/23/2022: 57.5 lbs Goal status: MET  3.  Pt will report no difficulty holding a dish while washing it.  Baseline: with  some difficulty 10/23/2022: has not tried recently; simulated in clinic: little difficulty Goal status: IN PROGRESS   LONG TERM GOALS: Target date: 11/17/2022    Patient will demonstrate updated RUE HEP with 25% verbal cues or less for proper execution. Baseline:  Goal status: IN PROGRESS  2.  Patient will demonstrate at least 50 lbs R grip strength as needed to open jars and other containers. Baseline: 38.8 lbs 10/23/2022: 57.5 lbs Goal status: MET  3.  Patient will complete nine-hole peg with use of R in 45 seconds or less. Baseline: 139 seconds 10/23/2022: 59 seconds; GOAL UPDATED (< or =  60 seconds originally) Goal status: REVISED  4.  Pt will complete d/c FOTO.  Baseline: 59 10/23/2022: 79 Goal status: INITIAL   ASSESSMENT:  CLINICAL IMPRESSION: Pt demonstrates mild processing deficits today while learning new game and modifying task. Would benefit from higher level cognitive challenges in addition to interventions with coordination and mobility.   PERFORMANCE DEFICITS: in functional skills including ADLs, IADLs, coordination, dexterity, strength, Fine motor control, Gross motor control, mobility, endurance, and UE functional use  IMPAIRMENTS: are limiting patient from ADLs, IADLs, work, and leisure.   CO-MORBIDITIES: may have co-morbidities  that affects occupational performance. Patient will benefit from skilled OT to address above impairments and improve overall function.  REHAB POTENTIAL: Good   PLAN:  OT FREQUENCY: 2x/week  OT DURATION: 8 weeks  PLANNED INTERVENTIONS: self care/ADL training, therapeutic exercise, therapeutic activity, neuromuscular re-education, manual therapy, functional mobility training, splinting, ultrasound, fluidotherapy, patient/family education, DME and/or AE instructions, and Re-evaluation  RECOMMENDED OTHER SERVICES: Receiving PT and ST  CONSULTED AND AGREED WITH PLAN OF CARE: Patient and family member/caregiver  PLAN FOR NEXT SESSION: work on bed mobility and transfers managing RLE? - pt's wife has returned to work. More weight bearing at mat table   Delana Meyer, OT 10/30/2022, 11:07 AM

## 2022-10-30 NOTE — Therapy (Signed)
OUTPATIENT PHYSICAL THERAPY NEURO TREATMENT   Patient Name: Joseph Bright MRN: [id number] DOB:[birth year], 47 y.o., male Today's Date: 10/30/2022   PCP: Noberto Retort, MD REFERRING PROVIDER: Charlton Amor, PA-C  END OF SESSION:  PT End of Session - 10/30/22 1018     Visit Number 9    Number of Visits 17   with eval   Date for PT Re-Evaluation 12/18/22    Authorization Type [employer]    PT Start Time 1016   from SLP session   PT Stop Time 1100    PT Time Calculation (min) 44 min    Equipment Utilized During Treatment Gait belt;Other (comment)   R toe cap, R AFO   Activity Tolerance Patient tolerated treatment well    Behavior During Therapy [facility] for tasks assessed/performed                     Past Medical History:  Diagnosis Date   Hypertension    Stroke [facility])    Past Surgical History:  Procedure Laterality Date   IR CT HEAD LTD  08/07/2022   IR INTRA CRAN STENT  08/07/2022   IR PERCUTANEOUS ART THROMBECTOMY/INFUSION INTRACRANIAL INC DIAG ANGIO  07/25/2022   LOOP RECORDER INSERTION N/A 07/28/2022   Procedure: LOOP RECORDER INSERTION;  Surgeon: Regan Lemming, MD;  Location: [facility] INVASIVE CV LAB;  Service: Cardiovascular;  Laterality: N/A;   RADIOLOGY WITH ANESTHESIA N/A 07/25/2022   Procedure: RADIOLOGY WITH ANESTHESIA;  Surgeon: Radiologist, Medication, MD;  Location: [facility] OR;  Service: Radiology;  Laterality: N/A;   TEE WITHOUT CARDIOVERSION N/A 07/28/2022   Procedure: TRANSESOPHAGEAL ECHOCARDIOGRAM;  Surgeon: Parke Poisson, MD;  Location: [facility] INVASIVE CV LAB;  Service: Cardiovascular;  Laterality: N/A;   Patient Active Problem List   Diagnosis Date Noted   Loud snoring 10/18/2022   Adjustment disorder with mixed anxiety and depressed mood 08/07/2022   Cerebrovascular accident (CVA) due to occlusion of left posterior communicating artery (HCC) 08/01/2022   Acute ischemic left ACA stroke (HCC) 07/25/2022   Arterial ischemic stroke, ACA (anterior  cerebral artery), left, acute (HCC) 07/25/2022   DYSLIPIDEMIA 04/24/2007   RENAL FAILURE NOS 11/29/2006   Essential hypertension 11/12/2006    ONSET DATE: 08/24/2022 (referral date)  REFERRING DIAG: I63.9 (ICD-10-CM) - CVA (cerebral vascular accident) (HCC)  THERAPY DIAG:  Muscle weakness (generalized)  Other lack of coordination  Unsteadiness on feet  Other abnormalities of gait and mobility  Rationale for Evaluation and Treatment: Rehabilitation  SUBJECTIVE:  SUBJECTIVE STATEMENT: Pt reports that he laid in bed and "had a moment" over the weekend but is feeling better today. Pt was unable to make it to stroke support event over the weekend but is interested in stroke support group. Provided pt with info for stroke support group.  Pt accompanied by: self; driven by ex wife and caregiver [name] ("Tab")  PERTINENT HISTORY:  hypertension, CKD stage IV followed by outpatient nephrology  PAIN:  Are you having pain? No  PRECAUTIONS: Fall and ICD/Pacemaker  WEIGHT BEARING RESTRICTIONS: No  FALLS: Has patient fallen in last 6 months? Yes. Number of falls 1, fell last weekend at home trying to do something by himself, he bumped his head, his ex-wife helped him to get back up after the fall  LIVING ENVIRONMENT: Lives with:  lives with ex-wife Lives in: House/apartment Stairs: No Has following equipment at home: Environmental consultant - 2 wheeled, Wheelchair (manual), shower chair, and bed side commode  PLOF: Needs assistance with ADLs, Needs assistance with homemaking, Needs assistance with gait, and Needs assistance with transfers  PATIENT GOALS: "to walk"  OBJECTIVE:   DIAGNOSTIC FINDINGS:  Brain MRI 07/26/22 IMPRESSION: 1. Confluent Left ACA territory infarct. Cytotoxic edema with no hemorrhagic  transformation.   2. Multiple additional scattered mostly punctate ischemic foci in the left greater than right MCA, PCA, and occasionally cerebellar artery territories. No significant intracranial mass effect.   3. 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   4. See intracranial MRA is reported separately today.    COGNITION: Overall cognitive status: Within functional limits for tasks assessed   SENSATION: N/T in R hemibody, impaired sensation though light touch appears intact  EDEMA:  Swelling in RLE/R foot  POSTURE: rounded shoulders, forward head, and posterior pelvic tilt   LOWER EXTREMITY MMT:    MMT Right Eval Left Eval  Hip flexion 1 5  Hip extension    Hip abduction    Hip adduction    Hip internal rotation    Hip external rotation    Knee flexion 1 5  Knee extension 1 5  Ankle dorsiflexion 0 5  Ankle plantarflexion    Ankle inversion    Ankle eversion    (Blank rows = not tested)  VITALS  Vitals:   10/30/22 1026  BP: 139/80  Pulse: 76      TODAY'S TREATMENT:                   Gait Gait pattern:  vaulting LLE but decreased from previous sessions, decreased hip/knee flexion- Right, and decreased ankle dorsiflexion- Right Distance walked: 115 ft x 2 Assistive device utilized: Walker - 2 wheeled Level of assistance: CGA Comments: without use of R AFO during initial lap, pt has increased R foot drag and increased vaulting with LLE and needs some assistance to advance RLE without use of AFO; recommended continued use of AFO and 2nd lap performed with AFO and toe cap--pt exhibits improved RLE clearance and decreased LLE vaulting with use of his AFO and toe cap   NMR Tall kneeling on mat table with use of bench for isolation of hip musculature for strengthening: Mini-squats 3 x 5 reps with no LUE support Lateral sidestepping L/R 3 x 5 ft each direction, x 3 reps  Some assist needed to advance RLE due to shoe  catching on mat table  Pt needs mod A to get into tall-kneeling position on mat table with use of bench and assist for  some trunk control and RLE management                                                                                              PATIENT EDUCATION: Education details: continue HEP, information for stroke support group, continue to wear AFO with gait Person educated: Patient Education method: Explanation and Handouts Education comprehension: verbalized understanding and needs further education  HOME EXERCISE PROGRAM: Access Code: [email or web address] URL: [email or web address] Date: 09/27/2022 Prepared by: Peter Congo  Exercises - Hooklying Heel Slide  - 1 x daily - 7 x weekly - 2 sets - 10-15 reps - Supine Bridge  - 1 x daily - 7 x weekly - 2-3 sets - 15 reps - Clamshell  - 1 x daily - 7 x weekly - 2 sets - 10 reps - Sidelying Reverse Clamshell  - 1 x daily - 7 x weekly - 2 sets - 10 reps - Long Sitting Hamstring Stretch  - 1 x daily - 7 x weekly - 1 sets - 3-5 reps - 30 sec hold - Staggered Sit-to-Stand  - 1 x daily - 7 x weekly - 3 sets - 5 reps   GOALS: Goals reviewed with patient? Yes  SHORT TERM GOALS: Target date: 10/23/2022  Pt will be independent with initial outpatient HEP for improved strength, balance, transfers and gait. Baseline: Goal status: MET  2.  Pt will improve 5 x STS to less than or equal to 30 seconds to demonstrate improved functional strength and transfer efficiency.  Baseline: 35.69 sec (7/1), 24.25 sec (7/29) Goal status: MET  3.  Pt will improve normal TUG to less than or equal to 55 seconds for improved functional mobility and decreased fall risk. Baseline: 67 sec with RW (7/1), 39.5 sec with RW (7/29) Goal status: MET  4.  Pt will improve gait velocity to at least .90 ft/sec for improved gait efficiency and performance at CGA level  Baseline: 0.67 ft/sec with RW and CGA (7/1), 0.92 ft/sec with RW and CGA Goal status:  MET  5.  Pt will ambulate x 150 ft with LRAD and CGA to demonstrate improved endurance and increased independence with functional mobility. Baseline: 32.8 ft with RW and CGA, 115 ft with RW and CGA (7/29) Goal status: IN PROGRESS  6.  Pt will initiate stair training as safe and able. Baseline: 8 stairs with 1-2 handrails and min A (7/29) Goal status: MET  LONG TERM GOALS: Target date: 11/23/2022   Pt will be independent with final HEP for improved strength, balance, transfers and gait. Baseline:  Goal status: INITIAL  2.  Pt will improve 5 x STS to less than or equal to 20 seconds to demonstrate improved functional strength and transfer efficiency.  Baseline: 35.69 sec (7/1), 24.25 sec (7/29) Goal status: REVISED/UPGRADED  3.  Pt will improve normal TUG to less than or equal to 35 seconds for improved functional mobility and decreased fall risk. Baseline: 67 sec with RW (7/1), 39.5 sec with RW (7/29) Goal status: REVISED/UPGRADED  4.  Pt will improve gait velocity to at least 1.25 ft/sec for improved gait efficiency and  performance at SBA level  Baseline: 0.67 ft/sec with RW and CGA (7/1), 0.92 ft/sec with RW and CGA Goal status: INITIAL  5.  Pt will ambulate x 200 ft with LRAD and SBA to demonstrate improved endurance and increased independence with functional mobility. Baseline: 32.8 ft with RW and CGA, 115 ft with RW and CGA (7/29) Goal status: REVISED  6.  Pt will ascend/descend 12 stairs with LRAD and min A to demonstrate increased independence with functional mobility. Baseline: not assessed at eval, 8 stairs with 1-2 handrails and min A (7/29) Goal status: INITIAL  ASSESSMENT:  CLINICAL IMPRESSION: Emphasis of skilled PT session on working RLE NMR in tall-kneeling position on mat table followed by ongoing gait training. Pt exhibits improved ability to perform exercises in tall-kneeling this date as compared to previous sessions. Pt does exhibit decreased ability to clear  RLE during gait with onset of increased LLE vaulting without use of AFO. With addition of R AFO pt exhibits improved gait mechanics as noted above. Pt continues to benefit from skilled therapy services to work towards increasing his safety and independence with functional mobility. Continue POC.    OBJECTIVE IMPAIRMENTS: Abnormal gait, cardiopulmonary status limiting activity, decreased activity tolerance, decreased balance, decreased coordination, decreased endurance, decreased mobility, difficulty walking, decreased strength, increased edema, impaired sensation, and impaired UE functional use.   ACTIVITY LIMITATIONS: carrying, lifting, bending, standing, squatting, stairs, transfers, bed mobility, bathing, toileting, dressing, reach over head, and hygiene/grooming  PARTICIPATION LIMITATIONS: meal prep, cleaning, laundry, interpersonal relationship, driving, shopping, community activity, and occupation  PERSONAL FACTORS: 1-2 comorbidities:    hypertension, CKD stage IV followed outpatient nephrology are also affecting patient's functional outcome.   REHAB POTENTIAL: Good  CLINICAL DECISION MAKING: Stable/uncomplicated  EVALUATION COMPLEXITY: Moderate  PLAN:  PT FREQUENCY: 2x/week  PT DURATION: 8 weeks  PLANNED INTERVENTIONS: Therapeutic exercises, Therapeutic activity, Neuromuscular re-education, Balance training, Gait training, Patient/Family education, Self Care, Joint mobilization, Stair training, Vestibular training, Canalith repositioning, Visual/preceptual remediation/compensation, Orthotic/Fit training, DME instructions, Aquatic Therapy, Dry Needling, Wheelchair mobility training, Cryotherapy, Moist heat, Taping, Manual therapy, and Re-evaluation  PLAN FOR NEXT SESSION: 10th PN, check BP, work on RLE NMR and strengthening, gait with RW--needs to work on decreasing vaulting with his LLE when stepping with RLE (could do forward/backward steps at mat or in // bars, sit to stands with  step under LLE, work on R HS strengthening, add lift to L side? Standing next to wall to block L hip); standing reaching task? RLE step-overs, has defibrillator so no e-stim    Peter Congo, PT, DPT, CSRS   10/30/2022, 11:03 AM

## 2022-10-30 NOTE — Therapy (Signed)
OUTPATIENT SPEECH LANGUAGE PATHOLOGY TREATMENT NOTE   Patient Name: Joseph Bright MRN: [id number] DOB:Feb 10, 1976, 47 y.o., male Today's Date: 10/30/2022  PCP: Joseph Retort, Bright REFERRING PROVIDER: Charlton Amor, PA-C  END OF SESSION:   End of Session - 10/30/22 0934     Visit Number 9    Number of Visits 25    Date for SLP Re-Evaluation 12/18/22    Authorization Type cign[employer] medicaid - 60 combined - each discipline counts as 1 visit -  physical is his primary concern    SLP Start Time 0930    SLP Stop Time  1015    SLP Time Calculation (min) 45 min    Activity Tolerance Patient tolerated treatment well              Past Medical History:  Diagnosis Date   Hypertension    Stroke Joseph Bright)    Past Surgical History:  Procedure Laterality Date   IR CT HEAD LTD  08/07/2022   IR INTRA CRAN STENT  08/07/2022   IR PERCUTANEOUS ART THROMBECTOMY/INFUSION INTRACRANIAL INC DIAG ANGIO  07/25/2022   LOOP RECORDER INSERTION N/A 07/28/2022   Procedure: LOOP RECORDER INSERTION;  Surgeon: Joseph Lemming, Bright;  [facility]: [facility] INVAS[facility];  Service: Cardiovascular;  Laterality: N/A;   RADIOLOGY WITH ANESTHESIA N/A 07/25/2022   Procedure: RADIOLOGY WITH ANESTHESIA;  Surgeon: Joseph Bright;  Location: [facility] OR;  Service: Radiology;  Laterality: N/A;   TEE WITHOUT CARDIOVERSION N/A 07/28/2022   Procedure: TRANSESOPHAGEAL ECHOCARDIOGRAM;  Surgeon: Joseph Poisson, Bright;  Location: [facility] INVASIVE CV LAB;  Service: Cardiovascular;  Laterality: N/A;   Patient Active Problem List   Diagnosis Date Noted   Loud snoring 10/18/2022   Adjustment disorder with mixed anxiety and depressed mood 08/07/2022   Cerebrovascular accident (CVA) due to occlusion of left posterior communicating artery (HCC) 08/01/2022   Acute ischemic left ACA stroke (HCC) 07/25/2022   Arterial ischemic stroke, ACA (anterior cerebral artery), left, acute (HCC) 07/25/2022   DYSLIPIDEMIA 04/24/2007   RENAL FAILURE  NOS 11/29/2006   Essential hypertension 11/12/2006    ONSET DATE: 07/25/22  REFERRING DIAG: I61.9 (ICD-10-CM) - CVA (cerebrovascular accident due to intracerebral hemorrhage) (HCC)  THERAPY DIAG:  Aphasia  Rationale for Evaluation and Treatment: Rehabilitation  SUBJECTIVE:   SUBJECTIVE STATEMENT: "I have been dealing with grief"   PAIN: Are you having pain? No  OBJECTIVE:   TODAY'S TREATMENT:                                                                                                                                         DATE:   11/19/22: Dewell reports some depression and loneliness since being out of work. Targeted compensations for aphasia and fillers/mazing in conversation  using slow rate, descriptions, self advocating. Slow rate and self advocating with supervision cues. Initially fillers 2-3x over 2 minutes, increased as complexity of  conversation increased, however 2-3 per utterance is improving. We generated strategies to assist with grief/depression including reaching out to friends, exercise, getting out doors. Resources of stroke support group, aphasia group, social media sights provided. See patient instructions   10/26/22: Addressed word finding with divergent naming task, with pt demonstrating ability to generate x23 words IND, additional x13 with mod semantic cues. Led pt through structured conversation to address use of word finding strategies and discourse cohesion strategies at conversational level. Cues given for stopping and thinking prior to responding to aid in cohesiveness. Occasional cues needed for use of anomia strategies. Pt with frequent fillers in conversation, mazing and false starts. Reduces when pt allows self time to formulate response prior to speaking.   10/23/22: Pt more alert during today's session, and is now coming to sessions alone because spouse has gone back to work. Pt able to name 10 words within a category during warm up activity. SLP  targeted word finding with scattergories. Patient able to generate words in a given category with 40% accuracy. Required frequent mod-A of semantic and phonemic cues as well as immediate feedback to generate more responses. Patient states that word finding is going better at home, and that he is implementing the practice of semantic feature analysis, which has helped.     PATIENT EDUCATION: Education details: See Today's Treatment; aphasia ed, compensatory strategies, see patient instructions Person educated: Patient Education method: Explanation, Demonstration, Verbal cues, and Handouts Education comprehension: verbal cues required and needs further education     GOALS: Goals reviewed with patient? Yes   SHORT TERM GOALS: Target date: 10/23/22   Pt will name 15 items in personally relevant category with rare min A Baseline: 9 items Goal status: MET   2.  Pt will use verbal compensations 4/5 word finding episodes with rare min A Baseline: 1/5 Goal status: MET   3.  Pt will generate complex sentences in structured tasks 4/5x with occasional min A Baseline: no complex sentences Goal status: ONGOING   4.  Pt will carry over 3 strategies to compensate for auditory comprehension Baseline: No strategies Goal status: MET   LONG TERM GOALS: Target date: 12/18/22   Pt will use verbal compensations for aphasia 5/5x in conversation with rare min A Baseline:  Goal status: INITIAL   2.  Pt will generate complex sentences in simple conversation 4/5 turns with occasional min A Baseline: simple sentences only Goal status: INITIAL   3.  Pt will use compensatory strategies to gain a turn in group or fast moving conversation 3x outside of therapy Baseline: no strategy for this Goal status: INITIAL   4.  Pt will reduce fillers of "um" and "ah" to 5 or less over 5 sentences Baseline: 15+ fillers over 3 sentence narrative Goal status: INITIAL   ASSESSMENT:   CLINICAL IMPRESSION: Patient  is a 47 y.o. male who was seen today for moderate aphasia characterized by halting, fillers due to anomia. [name] continues to improve with word finding in conversation as well as structured naming tasks. He is using verbal compensations for aphasia successfully and reports these strategies are also improving his word finding. Aphasia improved to mild to moderate. Auditory comprehension continues to improve for moderately complex conversation, however he continues to benefit from family being present to manage medical, legal and financial information/conversations. I recommend skilled ST to maximize communication for safety, independence, and return to PLOF.    OBJECTIVE IMPAIRMENTS: include aphasia. 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 Factors affecting potential to achieve goals and functional outcome are financial resources. Patient will benefit from skilled SLP services to address above impairments and improve overall function.   REHAB POTENTIAL: Good   PLAN:   SLP FREQUENCY: 2x/week   SLP DURATION: 12 weeks   PLANNED INTERVENTIONS: Language facilitation, Environmental controls, Cueing hierachy, Cognitive reorganization, Internal/external aids, Functional tasks, Multimodal communication approach, and SLP instruction and feedback       , Radene Journey, CCC-SLP 10/30/2022, 3:15 PM

## 2022-11-01 ENCOUNTER — Encounter: Payer: Self-pay | Admitting: Internal Medicine

## 2022-11-01 ENCOUNTER — Ambulatory Visit: Payer: Managed Care, Other (non HMO) | Attending: Cardiology | Admitting: Internal Medicine

## 2022-11-01 VITALS — BP 126/82 | HR 77 | Ht 68.0 in | Wt 206.0 lb

## 2022-11-01 DIAGNOSIS — I10 Essential (primary) hypertension: Secondary | ICD-10-CM | POA: Diagnosis not present

## 2022-11-01 DIAGNOSIS — N189 Chronic kidney disease, unspecified: Secondary | ICD-10-CM | POA: Diagnosis not present

## 2022-11-01 DIAGNOSIS — E782 Mixed hyperlipidemia: Secondary | ICD-10-CM

## 2022-11-01 DIAGNOSIS — I63522 Cerebral infarction due to unspecified occlusion or stenosis of left anterior cerebral artery: Secondary | ICD-10-CM

## 2022-11-01 DIAGNOSIS — I517 Cardiomegaly: Secondary | ICD-10-CM

## 2022-11-01 MED ORDER — AMLODIPINE BESYLATE 10 MG PO TABS: 10.0000 mg | ORAL_TABLET | Freq: Every day | ORAL | Status: DC

## 2022-11-01 MED ORDER — CARVEDILOL 25 MG PO TABS: 25.0000 mg | ORAL_TABLET | Freq: Two times a day (BID) | ORAL | 0 refills | Status: DC

## 2022-11-01 MED ORDER — CARVEDILOL 25 MG PO TABS: 25.0000 mg | ORAL_TABLET | Freq: Two times a day (BID) | ORAL | 3 refills | Status: DC

## 2022-11-01 NOTE — Patient Instructions (Signed)
Medication Instructions:  Your physician has recommended you make the following change in your medication:   -Stop labetalol (normodyne).  -Start carvedilol (coreg) 25mg  twice daily.  *If you need a refill on your cardiac medications before your next appointment, please call your pharmacy*   Lab Work: Your physician recommends that you return for lab work in: CBC within 2 weeks of cardiac MRI  If you have labs (blood work) drawn today and your tests are completely normal, you will receive your results only by: MyChart Message (if you have MyChart) OR A paper copy in the mail If you have any lab test that is abnormal or we need to change your treatment, we will call you to review the results.   Testing/Procedures: See below   Follow-Up: At [facility], you and your health needs are our priority.  As part of our continuing mission to provide you with exceptional heart care, we have created designated Provider Care Teams.  These Care Teams include your primary Cardiologist (physician) and Advanced Practice Providers (APPs -  Physician Assistants and Nurse Practitioners) who all work together to provide you with the care you need, when you need it.  We recommend signing up for the patient portal called "MyChart".  Sign up information is provided on this After Visit Summary.  MyChart is used to connect with patients for Virtual Visits (Telemedicine).  Patients are able to view lab/test results, encounter notes, upcoming appointments, etc.  Non-urgent messages can be sent to your provider as well.   To learn more about what you can do with MyChart, go to [email or web address].    Your next appointment:   3 month(s)  Provider:   Dr. Jacques Navy   Other Instructions   You are scheduled for Cardiac MRI on ______________. Please arrive for your appointment at ______________ ( arrive 30-45 minutes prior to test start time). ?  [facility] [address] [place], [place] [address] [phone] Please take advantage of the free valet parking available at the [facility] and [employer] (Entrance C).  Proceed to the [facility] Radiology Department (First Floor) for check-in.    Magnetic resonance imaging (MRI) is a painless test that produces images of the inside of the body without using Xrays.  During an MRI, strong magnets and radio waves work together in a Data processing manager to form detailed images.   MRI images may provide more details about a medical condition than X-rays, CT scans, and ultrasounds can provide.  You may be given earphones to listen for instructions.  You may eat a light breakfast and take medications as ordered with the exception of furosemide, hydrochlorothiazide, or spironolactone(fluid pill, other). Please avoid stimulants for 12 hr prior to test. (Ie. Caffeine, nicotine, chocolate, or antihistamine medications)  An IV will be inserted into one of your veins. Contrast material will be injected into your IV. It will leave your body through your urine within a day. You may be told to drink plenty of fluids to help flush the contrast material out of your system.  You will be asked to remove all metal, including: Watch, jewelry, and other metal objects including hearing aids, hair pieces and dentures. Also wearable glucose monitoring systems (ie. Freestyle Libre and Omnipods) (Braces and fillings normally are not a problem.)   TEST WILL TAKE APPROXIMATELY 1 HOUR  PLEASE NOTIFY SCHEDULING AT LEAST 24 HOURS IN ADVANCE IF YOU ARE UNABLE TO KEEP YOUR APPOINTMENT. [phone]  For more information and frequently asked  questions, please visit our website : [email or web address]  Please call the Cardiac Imaging Nurse Navigators with any questions/concerns. [phone] Office

## 2022-11-01 NOTE — Progress Notes (Signed)
Cardiology Office Note:    Date:  11/01/2022   ID:  [name], DOB [birth year], MRN [id number]  PCP:  Noberto Retort, MD  Cardiologist:  None  Electrophysiologist:  None   Referring MD: Noberto Retort, MD   Chief Complaint/Reason for Referral: Cryptogenic stroke, LVH  History of Present Illness:    Joseph Bright is a 47 y.o. male with a history of hypertension and stroke 07/25/2022 with right-sided weakness and aphasia.  TEE personally performed demonstrating severe LVH, hyperdynamic LV function, normal RV function, grossly normal valves, negative bubble study.  He has been seen by EP, stroke was felt to be either a large vessel disease versus cardioembolic source.  Due to negative findings on TEE, loop recorder placed, device interrogations have been performed and do not demonstrate atrial fibrillation thus far.  Pending sleep study.   No CP or SOB, no palpitations or syncope.  Continues to participate in frequent speech therapy, OT and PT, and feels he is making an upward trajectory.  Continues to have some right sided weakness with some shoulder immobility, and weakness in the right leg that limits his ability to ambulate.  Speech has nearly returned to normal.  He has 3 children, his middle son 52 year old son is expecting a baby soon, he looks forward to his first grandchild.  Discussed medication therapy with good blood pressure control at this time on amlodipine 10 mg daily his amlodipine, hydralazine 50 mg twice daily, no longer taking isosorbide, and continues on labetalol 200 mg twice daily.  We reviewed that labetalol may not be an optimal long-term strategy and now that he is remote from his stroke, transition to a better blood pressure agent long-term would be advisable.  Reviewed transitioning to carvedilol 25 mg twice daily continues on Crestor 20 mg daily LDL checked in May 2024 was 74.  Past Medical History:  Diagnosis Date   Hypertension    Stroke [facility])      Past Surgical History:  Procedure Laterality Date   IR CT HEAD LTD  08/07/2022   IR INTRA CRAN STENT  08/07/2022   IR PERCUTANEOUS ART THROMBECTOMY/INFUSION INTRACRANIAL INC DIAG ANGIO  07/25/2022   LOOP RECORDER INSERTION N/A 07/28/2022   Procedure: LOOP RECORDER INSERTION;  Surgeon: Regan Lemming, MD;  Location: [facility] INVASIVE CV LAB;  Service: Cardiovascular;  Laterality: N/A;   RADIOLOGY WITH ANESTHESIA N/A 07/25/2022   Procedure: RADIOLOGY WITH ANESTHESIA;  Surgeon: Radiologist, Medication, MD;  Location: [facility] OR;  Service: Radiology;  Laterality: N/A;   TEE WITHOUT CARDIOVERSION N/A 07/28/2022   Procedure: TRANSESOPHAGEAL ECHOCARDIOGRAM;  Surgeon: Parke Poisson, MD;  Location[facility] INVASIVE CV LAB;  Service: Cardiovascular;  Laterality: N/A;    Current Medications: Current Meds  Medication Sig   acetaminophen (TYLENOL) 325 MG tablet Take 325 mg by mouth daily as needed for mild pain.   aspirin 81 MG chewable tablet 1 tablet Orally Once a day   hydrALAZINE (APRESOLINE) 50 MG tablet TAKE 1 TABLET BY MOUTH TWICE DAILY for 90 days   levETIRAcetam (KEPPRA XR) 500 MG 24 hr tablet 1 tablet at 10 pm Orally Once a day   rosuvastatin (CRESTOR) 20 MG tablet    sodium bicarbonate 650 MG tablet 2 tablets Orally twice a day   ticagrelor (BRILINTA) 90 MG TABS tablet Take 90 mg by mouth 2 (two) times daily.   [DISCONTINUED] amLODipine (NORVASC) 10 MG tablet 0.5 tablet Orally Once a day for 90 days   [DISCONTINUED] carvedilol (COREG) 25 MG  tablet Take 1 tablet (25 mg total) by mouth 2 (two) times daily.   [DISCONTINUED] labetalol (NORMODYNE) 200 MG tablet    [DISCONTINUED] simvastatin (ZOCOR) 20 MG tablet TAKE 1 TABLET BY MOUTH ONCE DAILY IN THE EVENING for 90 days     Allergies:   Patient has no known allergies.   Social History   Tobacco Use   Smoking status: Former    Current packs/day: 0.00    Types: Cigarettes    Start date: 04/17/1995    Quit date: 04/16/2000    Years since  quitting: 22.5   Smokeless tobacco: Never  Substance Use Topics   Alcohol use: Yes    Comment: weekends/special events   Drug use: Never     Family History: The patient's family history is not on file.  ROS:   Please see the history of present illness.    All other systems reviewed and are negative.  EKGs/Labs/Other Studies Reviewed:    The following studies were reviewed today:  EKG:  n/a     Imaging studies that I have independently reviewed today: n/a  Recent Labs: 07/27/2022: Magnesium 2.2 08/02/2022: ALT 46 08/28/2022: BUN 78; Creatinine, Ser 3.66; Hemoglobin 12.7; Platelets 182; Potassium 4.3; Sodium 140  Recent Lipid Panel    Component Value Date/Time   CHOL 165 07/27/2022 0624   TRIG 193 (H) 08/01/2022 0620   HDL 28 (L) 07/27/2022 0624   CHOLHDL 5.9 07/27/2022 0624   VLDL 63 (H) 07/27/2022 0624   LDLCALC 74 07/27/2022 0624   LDLDIRECT 90 07/26/2022 0545    Physical Exam:    VS:  BP 126/82   Pulse 77   Ht 5\' 8"  (1.727 m)   Wt 206 lb (93.4 kg)   SpO2 98%   BMI 31.32 kg/m     Wt Readings from Last 5 Encounters:  11/01/22 206 lb (93.4 kg)  10/18/22 211 lb (95.7 kg)  09/07/22 225 lb (102.1 kg)  08/31/22 230 lb 6.1 oz (104.5 kg)  07/30/22 259 lb 7.7 oz (117.7 kg)    Constitutional: No acute distress Eyes: sclera non-icteric, normal conjunctiva and lids ENMT: normal dentition, moist mucous membranes Cardiovascular: regular rhythm, normal rate, no murmur. S1 and S2 normal. No jugular venous distention.  Respiratory: clear to auscultation bilaterally GI : normal bowel sounds, soft and nontender. No distention.   MSK: extremities warm, well perfused. No edema.  NEURO: Using a wheelchair today, right leg weakness.  No other notable focal deficits. PSYCH: alert and oriented x 3, normal mood and affect.   ASSESSMENT:    1. Arterial ischemic stroke, ACA (anterior cerebral artery), left, acute (HCC)   2. Essential hypertension   3. Chronic kidney disease,  unspecified CKD stage   4. Mixed hyperlipidemia   5. LVH (left ventricular hypertrophy)    PLAN:    Arterial ischemic stroke, ACA (anterior cerebral artery), left, acute (HCC) - Plan: CBC  Essential hypertension - Plan: CBC  Chronic kidney disease, unspecified CKD stage - Plan: CBC  Mixed hyperlipidemia - Plan: CBC  LVH (left ventricular hypertrophy) - Plan: MR CARDIAC MORPHOLOGY W WO CONTRAST, CBC  -Patient feels he is recovering from acute ischemic stroke.  Blood pressure well-controlled, we will now optimize his therapy by transitioning labetalol to carvedilol 25 mg twice daily.  Would recommend if blood pressure is greater than 150/100 on serial checks, we should promptly adjust therapy to further optimize blood pressure.  He gets his blood pressure checked frequently at his physical therapy appointments. -  For severe LVH noted on echo and TEE, would recommend cardiac MRI to rule out alternate pathology.  The most likely etiology is hypertensive heart disease.  Again blood pressure well-controlled at this time. -Patient consented for gadolinium contrast in the setting of GFR <30.  Theoretical risk of NSF reviewed and patient agrees to proceed. -Lipids with good control, continue Crestor 20 mg daily. -Patient continues on DAPT per neurology, his follow-up appointment is not till October, indication for DAPT is purely neurologic, defer to neurology for management of these medications and refills.   Weston Brass, MD, [facility]  [facility]   Shared Decision Making/Informed Consent:       Medication Adjustments/Labs and Tests Ordered: Current medicines are reviewed at length with the patient today.  Concerns regarding medicines are outlined above.   Orders Placed This Encounter  Procedures   MR CARDIAC MORPHOLOGY W WO CONTRAST   CBC    Meds ordered this encounter  Medications   amLODipine (NORVASC) 10 MG tablet    Sig: Take 1 tablet (10 mg total) by mouth daily.    DISCONTD: carvedilol (COREG) 25 MG tablet    Sig: Take 1 tablet (25 mg total) by mouth 2 (two) times daily.    Dispense:  180 tablet    Refill:  3   carvedilol (COREG) 25 MG tablet    Sig: Take 1 tablet (25 mg total) by mouth 2 (two) times daily.    Dispense:  30 tablet    Refill:  0    Medication coming from [facility], short supply until medication is shipped and received.    Patient Instructions  Medication Instructions:  Your physician has recommended you make the following change in your medication:   -Stop labetalol (normodyne).  -Start carvedilol (coreg) 25mg  twice daily.  *If you need a refill on your cardiac medications before your next appointment, please call your pharmacy*   Lab Work: Your physician recommends that you return for lab work in: CBC within 2 weeks of cardiac MRI  If you have labs (blood work) drawn today and your tests are completely normal, you will receive your results only by: MyChart Message (if you have MyChart) OR A paper copy in the mail If you have any lab test that is abnormal or we need to change your treatment, we will call you to review the results.   Testing/Procedures: See below   Follow-Up: At [facility], you and your health needs are our priority.  As part of our continuing mission to provide you with exceptional heart care, we have created designated Provider Care Teams.  These Care Teams include your primary Cardiologist (physician) and Advanced Practice Providers (APPs -  Physician Assistants and Nurse Practitioners) who all work together to provide you with the care you need, when you need it.  We recommend signing up for the patient portal called "MyChart".  Sign up information is provided on this After Visit Summary.  MyChart is used to connect with patients for Virtual Visits (Telemedicine).  Patients are able to view lab/test results, encounter notes, upcoming appointments, etc.  Non-urgent messages can be sent to your  provider as well.   To learn more about what you can do with MyChart, go to [email or web address].    Your next appointment:   3 month(s)  Provider:   Dr. Jacques Navy   Other Instructions   You are scheduled for Cardiac MRI on ______________. Please arrive for your appointment at ______________ ( arrive 30-45 minutes  prior to test start time). ?  [facility] [address] [place], [place] [address] [phone] Please take advantage of the free valet parking available at the [facility] and [employer] (Entrance C).  Proceed to the [facility] Radiology Department (First Floor) for check-in.    Magnetic resonance imaging (MRI) is a painless test that produces images of the inside of the body without using Xrays.  During an MRI, strong magnets and radio waves work together in a Data processing manager to form detailed images.   MRI images may provide more details about a medical condition than X-rays, CT scans, and ultrasounds can provide.  You may be given earphones to listen for instructions.  You may eat a light breakfast and take medications as ordered with the exception of furosemide, hydrochlorothiazide, or spironolactone(fluid pill, other). Please avoid stimulants for 12 hr prior to test. (Ie. Caffeine, nicotine, chocolate, or antihistamine medications)  An IV will be inserted into one of your veins. Contrast material will be injected into your IV. It will leave your body through your urine within a day. You may be told to drink plenty of fluids to help flush the contrast material out of your system.  You will be asked to remove all metal, including: Watch, jewelry, and other metal objects including hearing aids, hair pieces and dentures. Also wearable glucose monitoring systems (ie. Freestyle Libre and Omnipods) (Braces and fillings normally are not a problem.)   TEST WILL TAKE APPROXIMATELY 1 HOUR  PLEASE NOTIFY SCHEDULING AT LEAST 24 HOURS IN ADVANCE IF YOU ARE  UNABLE TO KEEP YOUR APPOINTMENT. [phone]  For more information and frequently asked questions, please visit our website : [email or web address]  Please call the Cardiac Imaging Nurse Navigators with any questions/concerns. [phone] Office

## 2022-11-02 ENCOUNTER — Ambulatory Visit: Payer: Managed Care, Other (non HMO) | Admitting: Occupational Therapy

## 2022-11-02 ENCOUNTER — Ambulatory Visit: Payer: Managed Care, Other (non HMO) | Admitting: Speech Pathology

## 2022-11-02 ENCOUNTER — Ambulatory Visit: Payer: Managed Care, Other (non HMO) | Admitting: Physical Therapy

## 2022-11-02 NOTE — Therapy (Deleted)
OUTPATIENT SPEECH LANGUAGE PATHOLOGY TREATMENT NOTE   Patient Name: Joseph Bright MRN: [id number] DOB:1975-12-15, 47 y.o., male Today's Date: 11/02/2022  PCP: Noberto Retort, MD REFERRING PROVIDER: Charlton Amor, PA-C  END OF SESSION:      Past Medical History:  Diagnosis Date   Hypertension    Stroke [facility])    Past Surgical History:  Procedure Laterality Date   IR CT HEAD LTD  08/07/2022   IR INTRA CRAN STENT  08/07/2022   IR PERCUTANEOUS ART THROMBECTOMY/INFUSION INTRACRANIAL INC DIAG ANGIO  07/25/2022   LOOP RECORDER INSERTION N/A 07/28/2022   Procedure: LOOP RECORDER INSERTION;  Surgeon: Regan Lemming, MD;  Location: [facility] INVASIVE CV LAB;  Service: Cardiovascular;  Laterality: N/A;   RADIOLOGY WITH ANESTHESIA N/A 07/25/2022   Procedure: RADIOLOGY WITH ANESTHESIA;  Surgeon: Radiologist, Medication, MD;  Location: [facility] OR;  Service: Radiology;  Laterality: N/A;   TEE WITHOUT CARDIOVERSION N/A 07/28/2022   Procedure: TRANSESOPHAGEAL ECHOCARDIOGRAM;  Surgeon: Parke Poisson, MD;  Location: [facility] INVASIVE CV LAB;  Service: Cardiovascular;  Laterality: N/A;   Patient Active Problem List   Diagnosis Date Noted   Loud snoring 10/18/2022   Adjustment disorder with mixed anxiety and depressed mood 08/07/2022   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  Rationale for Evaluation and Treatment: Rehabilitation  SUBJECTIVE:   SUBJECTIVE STATEMENT: ***  PAIN: Are you having pain? No  OBJECTIVE:   TODAY'S TREATMENT:                                                                                                                                          DATE:   11/02/22: ***  11/19/22:  reports some depression and loneliness since being out of work. Targeted compensations for aphasia and fillers/mazing in conversation  using slow rate, descriptions, self advocating. Slow rate and self advocating with supervision cues. Initially fillers 2-3x over 2 minutes, increased as complexity of conversation increased, however 2-3 per utterance is improving. We generated strategies to assist with grief/depression including reaching out to friends, exercise, getting out doors. Resources of stroke support group, aphasia group, social media sights provided. See patient instructions   10/26/22: Addressed word finding with divergent naming task, with pt demonstrating ability to generate x23 words IND, additional x13 with mod semantic cues. Led pt through structured conversation to address use of word finding strategies and discourse cohesion strategies at conversational level. Cues given for stopping and thinking prior to responding to aid in cohesiveness. Occasional cues  needed for use of anomia strategies. Pt with frequent fillers in conversation, mazing and false starts. Reduces when pt allows self time to formulate response prior to speaking.   10/23/22: Pt more alert during today's session, and is now coming to sessions alone because spouse has gone back to work. Pt able to name 10 words within a category during warm up activity. SLP targeted word finding with scattergories. Patient able to generate words in a given category with 40% accuracy. Required frequent mod-A of semantic and phonemic cues as well as immediate feedback to generate more responses. Patient states that word finding is going better at home, and that he is implementing the practice of semantic feature analysis, which has helped.     PATIENT EDUCATION: Education  details: See Today's Treatment; aphasia ed, compensatory strategies, see patient instructions Person educated: Patient Education method: Explanation, Demonstration, Verbal cues, and Handouts Education comprehension: verbal cues required and needs further education     GOALS: Goals reviewed with patient? Yes   SHORT TERM GOALS: Target date: 10/23/22   Pt will name 15 items in personally relevant category with rare min A Baseline: 9 items Goal status: MET   2.  Pt will use verbal compensations 4/5 word finding episodes with rare min A Baseline: 1/5 Goal status: MET   3.  Pt will generate complex sentences in structured tasks 4/5x with occasional min A Baseline: no complex sentences Goal status: ONGOING   4.  Pt will carry over 3 strategies to compensate for auditory comprehension Baseline: No strategies Goal status: MET   LONG TERM GOALS: Target date: 12/18/22   Pt will use verbal compensations for aphasia 5/5x in conversation with rare min A Baseline:  Goal status: INITIAL   2.  Pt will generate complex sentences in simple conversation 4/5 turns with occasional min A Baseline: simple sentences only Goal status: INITIAL   3.  Pt will use compensatory strategies to gain a turn in group or fast moving conversation 3x outside of therapy Baseline: no strategy for this Goal status: INITIAL   4.  Pt will reduce fillers of "um" and "ah" to 5 or less over 5 sentences Baseline: 15+ fillers over 3 sentence narrative Goal status: INITIAL   ASSESSMENT:   CLINICAL IMPRESSION: Patient is a 47 y.o. male who was seen today for moderate aphasia characterized by halting, fillers due to anomia. [place] continues to improve with word finding in conversation as well as structured naming tasks. He is using verbal compensations for aphasia successfully and reports these strategies are also improving his word finding. Aphasia improved to mild to moderate. Auditory comprehension continues to  improve for moderately complex conversation, however he continues to benefit from family being present to manage medical, legal and financial information/conversations. I recommend skilled ST to maximize communication for safety, independence, and return to PLOF.    OBJECTIVE IMPAIRMENTS: include aphasia. These impairments are limiting patient from return to work, ADLs/IADLs, and effectively communicating at home and in community. Factors affecting potential to achieve goals and functional outcome are financial resources. Patient will benefit from skilled SLP services to address above impairments and improve overall function.   REHAB POTENTIAL: Good   PLAN:   SLP FREQUENCY: 2x/week   SLP DURATION: 12 weeks   PLANNED INTERVENTIONS: Language facilitation, Environmental controls, Cueing hierachy, Cognitive reorganization, Internal/external aids, Functional tasks, Multimodal communication approach, and SLP instruction and feedback       Maia Breslow, CCC-SLP 11/02/2022, 6:47 AM

## 2022-11-06 ENCOUNTER — Ambulatory Visit: Payer: Managed Care, Other (non HMO) | Admitting: Speech Pathology

## 2022-11-06 ENCOUNTER — Encounter: Payer: Self-pay | Admitting: Occupational Therapy

## 2022-11-06 ENCOUNTER — Ambulatory Visit: Payer: Managed Care, Other (non HMO) | Admitting: Occupational Therapy

## 2022-11-06 ENCOUNTER — Encounter: Payer: Self-pay | Admitting: Speech Pathology

## 2022-11-06 ENCOUNTER — Ambulatory Visit: Payer: Managed Care, Other (non HMO) | Admitting: Physical Therapy

## 2022-11-06 VITALS — BP 136/87 | HR 90

## 2022-11-06 DIAGNOSIS — R2681 Unsteadiness on feet: Secondary | ICD-10-CM

## 2022-11-06 DIAGNOSIS — R278 Other lack of coordination: Secondary | ICD-10-CM

## 2022-11-06 DIAGNOSIS — M6281 Muscle weakness (generalized): Secondary | ICD-10-CM | POA: Diagnosis not present

## 2022-11-06 DIAGNOSIS — R4701 Aphasia: Secondary | ICD-10-CM

## 2022-11-06 DIAGNOSIS — R2689 Other abnormalities of gait and mobility: Secondary | ICD-10-CM

## 2022-11-06 NOTE — Therapy (Signed)
OUTPATIENT SPEECH LANGUAGE PATHOLOGY TREATMENT NOTE   Patient Name: Joseph Bright MRN: [id number] DOB:1975/08/25, 47 y.o., male Today's Date: 11/06/2022  PCP: Noberto Retort, MD REFERRING PROVIDER: Charlton Amor, PA-C  END OF SESSION:   End of Session - 11/06/22 0934     Visit Number 10    Number of Visits 25    Date for SLP Re-Evaluation 12/18/22    Authorization Type [employer] medicaid - 60 combined - each discipline counts as 1 visit -  physical is his primary concern    SLP Start Time 0930    SLP Stop Time  1015    SLP Time Calculation (min) 45 min    Activity Tolerance Patient tolerated treatment well               Past Medical History:  Diagnosis Date   Hypertension    Stroke [facility])    Past Surgical History:  Procedure Laterality Date   IR CT HEAD LTD  08/07/2022   IR INTRA CRAN STENT  08/07/2022   IR PERCUTANEOUS ART THROMBECTOMY/INFUSION INTRACRANIAL INC DIAG ANGIO  07/25/2022   LOOP RECORDER INSERTION N/A 07/28/2022   Procedure: LOOP RECORDER INSERTION;  Surgeon: Regan Lemming, MD;  Location: [facility] INVASIVE CV LAB;  Service: Cardiovascular;  Laterality: N/A;   RADIOLOGY WITH ANESTHESIA N/A 07/25/2022   Procedure: RADIOLOGY WITH ANESTHESIA;  Surgeon: Radiologist, Medication, MD;  Location: [facility] OR;  Service: Radiology;  Laterality: N/A;   TEE WITHOUT CARDIOVERSION N/A 07/28/2022   Procedure: TRANSESOPHAGEAL ECHOCARDIOGRAM;  Surgeon: Parke Poisson, MD;  Location: [facility] INVASIVE CV LAB;  Service: Cardiovascular;  Laterality: N/A;   Patient Active Problem List   Diagnosis Date Noted   Loud snoring 10/18/2022   Adjustment disorder with mixed anxiety and depressed mood 08/07/2022   Cerebrovascular accident (CVA) due to occlusion of left posterior communicating artery (HCC) 08/01/2022   Acute ischemic left ACA stroke (HCC) 07/25/2022   Arterial ischemic stroke, ACA (anterior cerebral artery), left, acute (HCC) 07/25/2022   DYSLIPIDEMIA 04/24/2007   Renal  failure 11/29/2006   Essential hypertension 11/12/2006    ONSET DATE: 07/25/22  REFERRING DIAG: I61.9 (ICD-10-CM) - CVA (cerebrovascular accident due to intracerebral hemorrhage) (HCC)  THERAPY DIAG:  Aphasia  Rationale for Evaluation and Treatment: Rehabilitation  SUBJECTIVE:   SUBJECTIVE STATEMENT: "I went to a stroke meeting and I was good - I needed it"  PAIN: Are you having pain? No  OBJECTIVE:   TODAY'S TREATMENT:                                                                                                                                         DATE:    11/06/22: [name] reports consistent use of verbal compensations and self advocacy with family and friends. He relays a situation where he used verbal compensation successfully. His primary concern is communication at work. Added goals re: this. Role  played phone calls for wheel chair service, scheduling service and describing the most common service issues with power chairs with occasional min A and extended time Naming technical parts of manual wheelchair with extended time, supervision cues. Fillers reduced but remain. [name] is not bothered by fillers as they have decreased. [name] will send his SLP's an email re: setting up a service call with a client.   8/5/24[name] reports some depression and loneliness since being out of work. Targeted compensations for aphasia and fillers/mazing in conversation  using slow rate, descriptions, self advocating. Slow rate and self advocating with supervision cues. Initially fillers 2-3x over 2 minutes, increased as complexity of conversation increased, however 2-3 per utterance is improving. We generated strategies to assist with grief/depression including reaching out to friends, exercise, getting out doors. Resources of stroke support group, aphasia group, social media sights provided. See patient instructions   10/26/22: Addressed word finding with divergent naming task, with pt  demonstrating ability to generate x23 words IND, additional x13 with mod semantic cues. Led pt through structured conversation to address use of word finding strategies and discourse cohesion strategies at conversational level. Cues given for stopping and thinking prior to responding to aid in cohesiveness. Occasional cues needed for use of anomia strategies. Pt with frequent fillers in conversation, mazing and false starts. Reduces when pt allows self time to formulate response prior to speaking.   10/23/22: Pt more alert during today's session, and is now coming to sessions alone because spouse has gone back to work. Pt able to name 10 words within a category during warm up activity. SLP targeted word finding with scattergories. Patient able to generate words in a given category with 40% accuracy. Required frequent mod-A of semantic and phonemic cues as well as immediate feedback to generate more responses. Patient states that word finding is going better at home, and that he is implementing the practice of semantic feature analysis, which has helped.     PATIENT EDUCATION: Education details: See Today's Treatment; aphasia ed, compensatory strategies, see patient instructions Person educated: Patient Education method: Explanation, Demonstration, Verbal cues, and Handouts Education comprehension: verbal cues required and needs further education     GOALS: Goals reviewed with patient? Yes   SHORT TERM GOALS: Target date: 10/23/22   Pt will name 15 items in personally relevant category with rare min A Baseline: 9 items Goal status: MET   2.  Pt will use verbal compensations 4/5 word finding episodes with rare min A Baseline: 1/5 Goal status: MET   3.  Pt will generate complex sentences in structured tasks 4/5x with occasional min A Baseline: no complex sentences Goal status: MET   4.  Pt will carry over 3 strategies to compensate for auditory comprehension Baseline: No strategies Goal  status: MET   LONG TERM GOALS: Target date: 12/18/22   Pt will use verbal compensations for aphasia 5/5x in conversation with rare min A Baseline:  Goal status: MET   2.  Pt will generate complex sentences in simple conversation 4/5 turns with occasional min A Baseline: simple sentences only Goal status: MET   3.  Pt will use compensatory strategies to gain a turn in group or fast moving conversation 3x outside of therapy Baseline: no strategy for this Goal status: MET   4.  Pt will reduce fillers of "um" and "ah" to 5 or less over 5 sentences Baseline: 15+ fillers over 3 sentence narrative Goal status: ONGOING  5. Pt will role play  communication interactions in work related scenarios using compensations for aphasia as needed                     Goal Status: New - added 11/06/22  6. Pt will generate 4 work related emails with no errors                Goal Status: New added 11/06/22   ASSESSMENT:   CLINICAL IMPRESSION: Patient is a 47 y.o. male who was seen today for moderate aphasia characterized by halting, fillers due to anomia. [place] continues to improve with word finding in conversation as well as structured naming tasks. He is using verbal compensations for aphasia successfully and reports these strategies are also improving his word finding. Aphasia improved to mild to moderate. Auditory comprehension continues to improve for moderately complex conversation, however he continues to benefit from family being present to manage medical, legal and financial information/conversations. I recommend skilled ST to maximize communication for safety, independence, and return to PLOF.    OBJECTIVE IMPAIRMENTS: include aphasia. These impairments are limiting patient from return to work, ADLs/IADLs, and effectively communicating at home and in community. Factors affecting potential to achieve goals and functional outcome are financial resources. Patient will benefit from skilled SLP  services to address above impairments and improve overall function.   REHAB POTENTIAL: Good   PLAN:   SLP FREQUENCY: 2x/week   SLP DURATION: 12 weeks   PLANNED INTERVENTIONS: Language facilitation, Environmental controls, Cueing hierachy, Cognitive reorganization, Internal/external aids, Functional tasks, Multimodal communication approach, and SLP instruction and feedback       , Radene Journey, CCC-SLP 11/06/2022, 10:14 AM

## 2022-11-06 NOTE — Therapy (Signed)
OUTPATIENT PHYSICAL THERAPY NEURO TREATMENT- 10TH VISIT PROGRESS NOTE   Patient Name: Joseph Bright MRN: [id number] DOB:06/05/1975, 47 y.o., male Today's Date: 11/06/2022   PCP: Noberto Retort, MD REFERRING PROVIDER: Charlton Amor, PA-C  Physical Therapy Progress Note   Dates of Reporting Period:09/25/22 - 11/06/22  See Note below for Objective Data and Assessment of Progress/Goals.    END OF SESSION:  PT End of Session - 11/06/22 1016     Visit Number 10    Number of Visits 17   with eval   Date for PT Re-Evaluation 12/18/22    Authorization Type [employer]    PT Start Time 1015    PT Stop Time 1100    PT Time Calculation (min) 45 min    Equipment Utilized During Treatment Gait belt;Other (comment)   R toe cap, R AFO   Activity Tolerance Patient tolerated treatment well    Behavior During Therapy [facility] for tasks assessed/performed                     Past Medical History:  Diagnosis Date   Hypertension    Stroke [facility])    Past Surgical History:  Procedure Laterality Date   IR CT HEAD LTD  08/07/2022   IR INTRA CRAN STENT  08/07/2022   IR PERCUTANEOUS ART THROMBECTOMY/INFUSION INTRACRANIAL INC DIAG ANGIO  07/25/2022   LOOP RECORDER INSERTION N/A 07/28/2022   Procedure: LOOP RECORDER INSERTION;  Surgeon: Regan Lemming, MD;  Location: [facility] INVASIVE CV LAB;  Service: Cardiovascular;  Laterality: N/A;   RADIOLOGY WITH ANESTHESIA N/A 07/25/2022   Procedure: RADIOLOGY WITH ANESTHESIA;  Surgeon: Radiologist, Medication, MD;  Location: [facility] OR;  Service: Radiology;  Laterality: N/A;   TEE WITHOUT CARDIOVERSION N/A 07/28/2022   Procedure: TRANSESOPHAGEAL ECHOCARDIOGRAM;  Surgeon: Parke Poisson, MD;  Location: [facility] INVASIVE CV LAB;  Service: Cardiovascular;  Laterality: N/A;   Patient Active Problem List   Diagnosis Date Noted   Loud snoring 10/18/2022   Adjustment disorder with mixed anxiety and depressed mood 08/07/2022   Cerebrovascular accident (CVA) due to  occlusion of left posterior communicating artery (HCC) 08/01/2022   Acute ischemic left ACA stroke (HCC) 07/25/2022   Arterial ischemic stroke, ACA (anterior cerebral artery), left, acute (HCC) 07/25/2022   DYSLIPIDEMIA 04/24/2007   Renal failure 11/29/2006   Essential hypertension 11/12/2006    ONSET DATE: 08/24/2022 (referral date)  REFERRING DIAG: I63.9 (ICD-10-CM) - CVA (cerebral vascular accident) (HCC)  THERAPY DIAG:  Unsteadiness on feet  Other lack of coordination  Other abnormalities of gait and mobility  Muscle weakness (generalized)  Rationale for Evaluation and Treatment: Rehabilitation  SUBJECTIVE:  SUBJECTIVE STATEMENT: Pt reports doing well. Has been walking more at home. Denies falls or acute changes. Was able to get his right shoe on independently over his AFO today for first time.   Pt accompanied by: self; driven by ex wife and caregiver [name] ("Tab")  PERTINENT HISTORY:  hypertension, CKD stage IV followed by outpatient nephrology  PAIN:  Are you having pain? No  PRECAUTIONS: Fall and ICD/Pacemaker  WEIGHT BEARING RESTRICTIONS: No  FALLS: Has patient fallen in last 6 months? Yes. Number of falls 1, fell last weekend at home trying to do something by himself, he bumped his head, his ex-wife helped him to get back up after the fall  LIVING ENVIRONMENT: Lives with:  lives with ex-wife Lives in: House/apartment Stairs: No Has following equipment at home: Environmental consultant - 2 wheeled, Wheelchair (manual), shower chair, and bed side commode  PLOF: Needs assistance with ADLs, Needs assistance with homemaking, Needs assistance with gait, and Needs assistance with transfers  PATIENT GOALS: "to walk"  OBJECTIVE:   DIAGNOSTIC FINDINGS:  Brain MRI 07/26/22 IMPRESSION: 1.  Confluent Left ACA territory infarct. Cytotoxic edema with no hemorrhagic transformation.   2. Multiple additional scattered mostly punctate ischemic foci in the left greater than right MCA, PCA, and occasionally cerebellar artery territories. No significant intracranial mass effect.   3. 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   4. See intracranial MRA is reported separately today.    COGNITION: Overall cognitive status: Within functional limits for tasks assessed   SENSATION: N/T in R hemibody, impaired sensation though light touch appears intact  EDEMA:  Swelling in RLE/R foot  POSTURE: rounded shoulders, forward head, and posterior pelvic tilt   LOWER EXTREMITY MMT:    MMT Right Eval Left Eval  Hip flexion 1 5  Hip extension    Hip abduction    Hip adduction    Hip internal rotation    Hip external rotation    Knee flexion 1 5  Knee extension 1 5  Ankle dorsiflexion 0 5  Ankle plantarflexion    Ankle inversion    Ankle eversion    (Blank rows = not tested)  VITALS  Vitals:   11/06/22 1020 11/06/22 1058  BP: (!) 148/94 136/87  Pulse: 71 90     TODAY'S TREATMENT:                   Gait Training  Gait pattern:  vaulting LLE but decreased from previous sessions, ER of RLE, decreased hip/knee flexion- Right, and decreased ankle dorsiflexion- Right Distance walked:  43' + 230' (at end of session) plus various clinic distances  Assistive device utilized: Environmental consultant - 2 wheeled and R AFO Level of assistance: SBA Comments: Pt demonstrated significant improvement in gait endurance this date, w/decreased vaulting of LLE noted even w/fatigue. RPE of 3/10 following 345'. No catching of RLE noted this date. Pt ambulated 230' at end of session when fatigued and noted consistency w/gait kinematics. Encouraged pt to attempt to walk in house rather than use WC and to attempt to come to sessions without WC in future. Pt in agreement  and stated he almost walked into session today, but changed his mind last minute.   NMR  In // bars for improved gait kinematics, midline orientation and BLE strength: Fwd/retro gait w/o UE support x4 while using mirror for visual biofeedback on body position, CGA-min A for safety. Mod verbal cues for improved abduction of RLE throughout, especially in  retro direction, as pt drifting to L side.  Mini squats on rockerboard in L/R direction w/intermittent UE support, x12 reps. Min verbal cues for abduction of BLEs throughout, as pt bracing his knees together to stabilize.                                                                       PATIENT EDUCATION: Education details: continue HEP, walking more at home w/AFO  Person educated: Patient Education method: Chief Technology Officer Education comprehension: verbalized understanding and needs further education  HOME EXERCISE PROGRAM: Access Code: [email or web address] URL: [email or web address] Date: 09/27/2022 Prepared by: Peter Congo  Exercises - Hooklying Heel Slide  - 1 x daily - 7 x weekly - 2 sets - 10-15 reps - Supine Bridge  - 1 x daily - 7 x weekly - 2-3 sets - 15 reps - Clamshell  - 1 x daily - 7 x weekly - 2 sets - 10 reps - Sidelying Reverse Clamshell  - 1 x daily - 7 x weekly - 2 sets - 10 reps - Long Sitting Hamstring Stretch  - 1 x daily - 7 x weekly - 1 sets - 3-5 reps - 30 sec hold - Staggered Sit-to-Stand  - 1 x daily - 7 x weekly - 3 sets - 5 reps   GOALS: Goals reviewed with patient? Yes  SHORT TERM GOALS: Target date: 10/23/2022  Pt will be independent with initial outpatient HEP for improved strength, balance, transfers and gait. Baseline: Goal status: MET  2.  Pt will improve 5 x STS to less than or equal to 30 seconds to demonstrate improved functional strength and transfer efficiency.  Baseline: 35.69 sec (7/1), 24.25 sec (7/29) Goal status: MET  3.  Pt will improve normal TUG to less than or  equal to 55 seconds for improved functional mobility and decreased fall risk. Baseline: 67 sec with RW (7/1), 39.5 sec with RW (7/29) Goal status: MET  4.  Pt will improve gait velocity to at least .90 ft/sec for improved gait efficiency and performance at CGA level  Baseline: 0.67 ft/sec with RW and CGA (7/1), 0.92 ft/sec with RW and CGA Goal status: MET  5.  Pt will ambulate x 150 ft with LRAD and CGA to demonstrate improved endurance and increased independence with functional mobility. Baseline: 32.8 ft with RW and CGA, 115 ft with RW and CGA (7/29) Goal status: IN PROGRESS  6.  Pt will initiate stair training as safe and able. Baseline: 8 stairs with 1-2 handrails and min A (7/29) Goal status: MET  LONG TERM GOALS: Target date: 11/23/2022   Pt will be independent with final HEP for improved strength, balance, transfers and gait. Baseline:  Goal status: INITIAL  2.  Pt will improve 5 x STS to less than or equal to 20 seconds to demonstrate improved functional strength and transfer efficiency.  Baseline: 35.69 sec (7/1), 24.25 sec (7/29) Goal status: REVISED/UPGRADED  3.  Pt will improve normal TUG to less than or equal to 35 seconds for improved functional mobility and decreased fall risk. Baseline: 67 sec with RW (7/1), 39.5 sec with RW (7/29) Goal status: REVISED/UPGRADED  4.  Pt will improve gait velocity to at least 1.25 ft/sec for improved  gait efficiency and performance at SBA level  Baseline: 0.67 ft/sec with RW and CGA (7/1), 0.92 ft/sec with RW and CGA Goal status: INITIAL  5.  Pt will ambulate x 200 ft with LRAD and SBA to demonstrate improved endurance and increased independence with functional mobility. Baseline: 32.8 ft with RW and CGA, 115 ft with RW and CGA (7/29); 345' w/RW and SBA Goal status: MET  6.  Pt will ascend/descend 12 stairs with LRAD and min A to demonstrate increased independence with functional mobility. Baseline: not assessed at eval, 8 stairs  with 1-2 handrails and min A (7/29) Goal status: INITIAL  ASSESSMENT:  CLINICAL IMPRESSION: Emphasis of skilled PT session on improved endurance, BLE strength and gait w/no AD. Pt able to walk 345' this date w/no breaks at SBA level, meeting his LTG #5. Pt reports he has been walking more at home and was able to get his R shoe on over his AFO independently for first time today. Encouraged pt to attempt to get away from using the [facility] at home, as he was able to ambulate 230' at end of session w/no instability even when fatigued. Pt demonstrates significant reduction in vaulting this date, but still present, especially when pt fatigued. Pt continues to be limited by R hip abduction weakness, resulting in scissoring of RLE w/gait but pt able to correct w/min verbal cues. Continue POC.    OBJECTIVE IMPAIRMENTS: Abnormal gait, cardiopulmonary status limiting activity, decreased activity tolerance, decreased balance, decreased coordination, decreased endurance, decreased mobility, difficulty walking, decreased strength, increased edema, impaired sensation, and impaired UE functional use.   ACTIVITY LIMITATIONS: carrying, lifting, bending, standing, squatting, stairs, transfers, bed mobility, bathing, toileting, dressing, reach over head, and hygiene/grooming  PARTICIPATION LIMITATIONS: meal prep, cleaning, laundry, interpersonal relationship, driving, shopping, community activity, and occupation  PERSONAL FACTORS: 1-2 comorbidities:    hypertension, CKD stage IV followed outpatient nephrology are also affecting patient's functional outcome.   REHAB POTENTIAL: Good  CLINICAL DECISION MAKING: Stable/uncomplicated  EVALUATION COMPLEXITY: Moderate  PLAN:  PT FREQUENCY: 2x/week  PT DURATION: 8 weeks  PLANNED INTERVENTIONS: Therapeutic exercises, Therapeutic activity, Neuromuscular re-education, Balance training, Gait training, Patient/Family education, Self Care, Joint mobilization, Stair training,  Vestibular training, Canalith repositioning, Visual/preceptual remediation/compensation, Orthotic/Fit training, DME instructions, Aquatic Therapy, Dry Needling, Wheelchair mobility training, Cryotherapy, Moist heat, Taping, Manual therapy, and Re-evaluation  PLAN FOR NEXT SESSION:  check BP, work on RLE NMR and strengthening, gait with RW--needs to work on decreasing vaulting with his LLE when stepping with RLE (could do forward/backward steps at mat or in // bars, sit to stands with step under LLE, work on R HS strengthening, add lift to L side? Standing next to wall to block L hip); standing reaching task? RLE step-overs, has defibrillator so no e-stim    Josephine Igo, PT, DPT [facility] [address] [place], [place]  [address] Phone:  [phone] Fax:  [phone] 11/06/2022, 11:02 AM

## 2022-11-06 NOTE — Patient Instructions (Addendum)
   Great job playing around on your work Animator work  Financial risk analyst role play some work conversations, Art gallery manager naming all the parts of the Optician, dispensing with part names and actions  [email or web address]  [email or web address]  Send [place] a practice email - include the details to set up an appointment

## 2022-11-06 NOTE — Therapy (Signed)
OUTPATIENT OCCUPATIONAL THERAPY NEURO TREATMENT  Patient Name: Joseph Bright MRN: [id number] DOB:Aug 10, 1975, 47 y.o., male Today's Date: 11/06/2022  PCP: Noberto Retort, MD  REFERRING PROVIDER: Charlton Amor, PA-C   END OF SESSION:  OT End of Session - 11/06/22 1101     Visit Number 10    Number of Visits 17    Date for OT Re-Evaluation 11/20/22   Date adjusted as pt was not wanting to schedue visits the week of 09/18/2022   Authorization Type [employer] (60 VL - OT, PT, ST)    OT Start Time 1100    OT Stop Time 1145    OT Time Calculation (min) 45 min    Activity Tolerance Patient tolerated treatment well    Behavior During Therapy [facility] for tasks assessed/performed             Past Medical History:  Diagnosis Date   Hypertension    Stroke [facility])    Past Surgical History:  Procedure Laterality Date   IR CT HEAD LTD  08/07/2022   IR INTRA CRAN STENT  08/07/2022   IR PERCUTANEOUS ART THROMBECTOMY/INFUSION INTRACRANIAL INC DIAG ANGIO  07/25/2022   LOOP RECORDER INSERTION N/A 07/28/2022   Procedure: LOOP RECORDER INSERTION;  Surgeon: Regan Lemming, MD;  Location: [facility] INVASIVE CV LAB;  Service: Cardiovascular;  Laterality: N/A;   RADIOLOGY WITH ANESTHESIA N/A 07/25/2022   Procedure: RADIOLOGY WITH ANESTHESIA;  Surgeon: Radiologist, Medication, MD;  Location: [facility] OR;  Service: Radiology;  Laterality: N/A;   TEE WITHOUT CARDIOVERSION N/A 07/28/2022   Procedure: TRANSESOPHAGEAL ECHOCARDIOGRAM;  Surgeon: Parke Poisson, MD;  Location: [facility] INVASIVE CV LAB;  Service: Cardiovascular;  Laterality: N/A;   Patient Active Problem List   Diagnosis Date Noted   Loud snoring 10/18/2022   Adjustment disorder with mixed anxiety and depressed mood 08/07/2022   Cerebrovascular accident (CVA) due to occlusion of left posterior communicating artery (HCC) 08/01/2022   Acute ischemic left ACA stroke (HCC) 07/25/2022   Arterial ischemic stroke, ACA (anterior cerebral artery), left, acute  (HCC) 07/25/2022   DYSLIPIDEMIA 04/24/2007   Renal failure 11/29/2006   Essential hypertension 11/12/2006    ONSET DATE: 07/25/2022  REFERRING DIAG: I61.9 (ICD-10-CM) - CVA (cerebrovascular accident due to intracerebral hemorrhage)   THERAPY DIAG:  Other lack of coordination  Unsteadiness on feet  Rationale for Evaluation and Treatment: Rehabilitation  SUBJECTIVE:   SUBJECTIVE STATEMENT: Pt reports he is getting in/out of bed himself using bed rail and Lt foot under Rt foot for leg management. I'm also sleeping better at night. I've also been practicing using my Rt hand (for in hand manipulation)   Pt accompanied by: Self  PERTINENT HISTORY: Pt presented to ED 07/25/2022. "Cranial CT scan negative for acute changes. There was chronic appearing right thalamic lacunar infarct. 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 Status post TNK with interventional radiology for stenting. MRI follow-up showed confluent left ACA territory infarction. Cytotoxic edema with no hemorrhagic transformation. Multiple additional scattered mostly punctate ischemic foci in the left greater than right MCA, PCA and occasionally cerebellar artery territories."  Pt in IRF from 08/01/2022 -08/31/2022 then discharged to OP OT, PT, and ST.   PRECAUTIONS: Fall  WEIGHT BEARING RESTRICTIONS: No  PAIN:  Are you having pain? No  FALLS: Has patient fallen in last 6 months? No  LIVING ENVIRONMENT: Lives with: lives with their family Lives in: House/apartment Stairs: No Has following equipment at home: Dan Humphreys -  2 wheeled, Environmental consultant - 4 wheeled, Wheelchair (manual), bed side commode, and Grab bars  PLOF: Independent; full-time working for [employer]; driving; sports (football and basketball)  PATIENT GOALS: return to [employer]  OBJECTIVE:   HAND DOMINANCE: Right  ADLs: Overall ADLs: min A  IADLs: Shopping: dependent Light housekeeping: dependent Community mobility:  dependent  Handwriting: Not legible  MOBILITY STATUS: Needs Assist: manual wheelchair  ACTIVITY TOLERANCE: Activity tolerance: fair  FUNCTIONAL OUTCOME MEASURES: FOTO: Slight: 59 10/23/2022: 79   UPPER EXTREMITY ROM:     AROM Right (eval) Left (eval)  Shoulder flexion WNL WNL  Shoulder abduction WNL WNL  Elbow flexion WNL WNL  Elbow extension WNL WNL  Wrist flexion WNL WNL  Wrist extension WNL WNL  Wrist pronation WNL WNL  Wrist supination WNL WNL   Digit Composite Flexion WNL WNL  Digit Composite Extension WNL WNL  Digit Opposition WNL WNL  (Blank rows = not tested)  RUE: IR to hip; overall bradykinesia with tremor  UPPER EXTREMITY MMT:     MMT Right (eval) Left (eval)  Shoulder flexion 4/5 WNL  Shoulder abduction 4/5 WNL  Elbow flexion 4/5 WNL  Elbow extension 4/5 WNL  (Blank rows = not tested)  HAND FUNCTION: Grip strength: Right: 38.8 lbs; Left: 82.8 lbs 10/23/2022: 57.5 lbs  COORDINATION: 9 Hole Peg test: Right: 139 sec; Left: 32 sec 10/23/2022: 59 seconds  SENSATION: Paresthesias reported in R fingertips  EDEMA: none reported or observed  MUSCLE TONE: WFL  COGNITION: Overall cognitive status: Within functional limits for tasks assessed  VISION: Subjective report: no changes Baseline vision: Wears glasses for reading only Visual history:  none  VISION ASSESSMENT: WFL  PERCEPTION: WFL  PRAXIS: WFL  OBSERVATIONS: Pt appears well-kept. He is able to propel w/c with increased time. Has RW with him.   TODAY'S TREATMENT:             Pt reports getting in/out of bed himself using bed rail and other foot to assist lifting Rt foot/leg Discussed goals and progress towards goals including how to safely wash dishes in standing with hips against countertop and w/c locked behind him. Also instructed to make sure someone is home when attempting to wash dishes Coordination tasks Rt hand including: placing marbles in plastic ice cube tray up to 3 at  a time, placing small pieces in operation perfection game according to diagram (1 medium and 1 hard) for cognitive component and then removing with tweezers for control and pinch strength with max difficulty.  Pt completing caterpillar bead puzzles x 2 patterns collecting up to 5 colored beads needed for the puzzle in hand, then using in hand manipulation to transition bead to tips of thumb and index finger before placing into board for improved right hand fine motor coordination and translation.    For 2 handed tasks: practiced tying shoes and braiding string with forced use of Rt hand, pt still using Lt hand more than Rt. Pt instructed to practice tying and braiding at home w/ more use of Rt hand, less use of Lt hand.  Practiced writing name in print x 3, followed by writing short sentences in print with regular pen, average 75 to 80% legibility. Pt encouraged to practice writing at home  PATIENT EDUCATION: Education details: see above Person educated: Patient Education method: Explanation, Demonstration, and Verbal cues Education comprehension: verbalized understanding, returned demonstration, and verbal cues required  HOME EXERCISE PROGRAM: 09/12/2022: [place] Si putty and coordination HEPs 09/27/22: Additional coordination HEP  10/04/22:  shoulder girdle/scapula strengthening HEP  10/23/2022: Shoulder slides and alphabet  GOALS:  SHORT TERM GOALS: Target date: 10/10/2022    Patient will demonstrate independence with initial RUE HEP. Baseline: Goal status: MET  2.  Patient will demonstrate at least 45 lbs R grip strength as needed to open jars and other containers. Baseline: 38.8 lbs 10/23/2022: 57.5 lbs Goal status: MET  3.  Pt will report no difficulty holding a dish while washing it.  Baseline: with some difficulty 10/23/2022: has not tried recently; simulated in clinic: little difficulty Goal status: IN PROGRESS   LONG TERM GOALS: Target date: 11/17/2022    Patient will demonstrate  updated RUE HEP with 25% verbal cues or less for proper execution. Baseline:  Goal status: IN PROGRESS  2.  Patient will demonstrate at least 50 lbs R grip strength as needed to open jars and other containers. Baseline: 38.8 lbs 10/23/2022: 57.5 lbs Goal status: MET  3.  Patient will complete nine-hole peg with use of R in 45 seconds or less. Baseline: 139 seconds 10/23/2022: 59 seconds; GOAL UPDATED (< or = 60 seconds originally) Goal status: REVISED  4.  Pt will complete d/c FOTO.  Baseline: 59 10/23/2022: 79 Goal status: INITIAL   ASSESSMENT:  CLINICAL IMPRESSION: Pt w/ difficulty performing 2 hand coordination tasks (tying, braiding) using Rt hand as dominant hand.  Would also continue to benefit from higher level cognitive challenges in addition to interventions with coordination and mobility.   PERFORMANCE DEFICITS: in functional skills including ADLs, IADLs, coordination, dexterity, strength, Fine motor control, Gross motor control, mobility, endurance, and UE functional use  IMPAIRMENTS: are limiting patient from ADLs, IADLs, work, and leisure.   CO-MORBIDITIES: may have co-morbidities  that affects occupational performance. Patient will benefit from skilled OT to address above impairments and improve overall function.  REHAB POTENTIAL: Good   PLAN:  OT FREQUENCY: 2x/week  OT DURATION: 8 weeks  PLANNED INTERVENTIONS: self care/ADL training, therapeutic exercise, therapeutic activity, neuromuscular re-education, manual therapy, functional mobility training, splinting, ultrasound, fluidotherapy, patient/family education, DME and/or AE instructions, and Re-evaluation  RECOMMENDED OTHER SERVICES: Receiving PT and ST  CONSULTED AND AGREED WITH PLAN OF CARE: Patient and family member/caregiver  PLAN FOR NEXT SESSION: work on bed mobility and transfers managing RLE? prn - pt's wife has returned to work. More weight bearing at mat table, continue coordination and forced use  of RUE   Sheran Lawless, OT 11/06/2022, 11:02 AM

## 2022-11-08 ENCOUNTER — Other Ambulatory Visit: Payer: Self-pay

## 2022-11-08 NOTE — Patient Outreach (Signed)
Telephone outreach to patient to obtain mRS was successfully completed. MRS=2  Joseph Bright TH[facility] Care Management Assistant [phone]  

## 2022-11-09 ENCOUNTER — Ambulatory Visit: Payer: Managed Care, Other (non HMO) | Admitting: Speech Pathology

## 2022-11-09 ENCOUNTER — Ambulatory Visit: Payer: Managed Care, Other (non HMO) | Admitting: Physical Therapy

## 2022-11-09 ENCOUNTER — Ambulatory Visit: Payer: Managed Care, Other (non HMO) | Admitting: Occupational Therapy

## 2022-11-09 ENCOUNTER — Encounter: Payer: Self-pay | Admitting: Physical Therapy

## 2022-11-09 DIAGNOSIS — M6281 Muscle weakness (generalized): Secondary | ICD-10-CM

## 2022-11-09 DIAGNOSIS — R4701 Aphasia: Secondary | ICD-10-CM

## 2022-11-09 DIAGNOSIS — R2689 Other abnormalities of gait and mobility: Secondary | ICD-10-CM

## 2022-11-09 DIAGNOSIS — R29818 Other symptoms and signs involving the nervous system: Secondary | ICD-10-CM

## 2022-11-09 DIAGNOSIS — R2681 Unsteadiness on feet: Secondary | ICD-10-CM

## 2022-11-09 DIAGNOSIS — R278 Other lack of coordination: Secondary | ICD-10-CM

## 2022-11-09 NOTE — Therapy (Signed)
OUTPATIENT SPEECH LANGUAGE PATHOLOGY TREATMENT NOTE   Patient Name: Joseph Bright MRN: [id number] DOB:[birth year], 47 y.o., male Today's Date: 11/09/2022  PCP: Joseph Retort, Bright REFERRING PROVIDER: Charlton Amor, PA-C  END OF SESSION:   End of Session - 11/09/22 0831     Visit Number 11    Number of Visits 25    Date for SLP Re-Evaluation 12/18/22    Authorization Type [employer] medicaid - 60 combined - each discipline counts as 1 visit -  physical is his primary concern    SLP Start Time 0845    SLP Stop Time  0930    SLP Time Calculation (min) 45 min    Activity Tolerance Patient tolerated treatment well               Past Medical History:  Diagnosis Date   Hypertension    Stroke [facility])    Past Surgical History:  Procedure Laterality Date   IR CT HEAD LTD  08/07/2022   IR INTRA CRAN STENT  08/07/2022   IR PERCUTANEOUS ART THROMBECTOMY/INFUSION INTRACRANIAL INC DIAG ANGIO  07/25/2022   LOOP RECORDER INSERTION N/A 07/28/2022   Procedure: LOOP RECORDER INSERTION;  Surgeon: Joseph Lemming, Bright;  Location: [facility] INVASIVE CV LAB;  Service: Cardiovascular;  Laterality: N/A;   RADIOLOGY WITH ANESTHESIA N/A 07/25/2022   Procedure: RADIOLOGY WITH ANESTHESIA;  Surgeon: Joseph Bright;  Location: [facility] OR;  Service: Radiology;  Laterality: N/A;   TEE WITHOUT CARDIOVERSION N/A 07/28/2022   Procedure: TRANSESOPHAGEAL ECHOCARDIOGRAM;  Surgeon: Joseph Poisson, Bright;  Location: [facility] INVASIVE CV LAB;  Service: Cardiovascular;  Laterality: N/A;   Patient Active Problem List   Diagnosis Date Noted   Loud snoring 10/18/2022   Adjustment disorder with mixed anxiety and depressed mood 08/07/2022   Cerebrovascular accident (CVA) due to occlusion of left posterior communicating artery (HCC) 08/01/2022   Acute ischemic left ACA stroke (HCC) 07/25/2022   Arterial ischemic stroke, ACA (anterior cerebral artery), left, acute (HCC) 07/25/2022   DYSLIPIDEMIA 04/24/2007   Renal  failure 11/29/2006   Essential hypertension 11/12/2006    ONSET DATE: 07/25/22  REFERRING DIAG: I61.9 (ICD-10-CM) - CVA (cerebrovascular accident due to intracerebral hemorrhage) (HCC)  THERAPY DIAG:  Aphasia  Rationale for Evaluation and Treatment: Rehabilitation  SUBJECTIVE:   SUBJECTIVE STATEMENT: Reports plan to return to work, able to relay details about work requirements.  PAIN: Are you having pain? No  OBJECTIVE:   TODAY'S TREATMENT:                                                                                                                                         DATE:   11/09/22: Pt completed HEP, sent to emails to treating SLPs. Demonstration of wheelchair parts sheet and sample email for tech scheduling. Error ID on latter email. SLP A pt in generating strategy of creating checklist to ensure completeness of  of x2 types of emails. Pt generates list of x5 work tasks previously performed.  Given list of wheel chair parts, pt able to read name and then describe x4 parts. Response to questions from SLP re: named parts with pt successful Usual fillers and anomia evidenced throughout conversation, with occasional cues to use description when anomia occurs. Some vagueness exhibited, requiring cues from SLP to elicit more concrete language to relay thoughts/ideas to listener unfamiliar with topic.   11/06/22: Joseph Bright reports consistent use of verbal compensations and self advocacy with family and friends. He relays a situation where he used verbal compensation successfully. His primary concern is communication at work. Added goals re: this. Role played phone calls for wheel chair service, scheduling service and describing the most common service issues with power chairs with occasional min A and extended time Naming technical parts of manual wheelchair with extended time, supervision cues. Fillers reduced but remain. Joseph Bright is not bothered by fillers as they have decreased. Joseph Bright  will send his SLP's an email re: setting up a service call with a client.   8/5/24[name] reports some depression and loneliness since being out of work. Targeted compensations for aphasia and fillers/mazing in conversation  using slow rate, descriptions, self advocating. Slow rate and self advocating with supervision cues. Initially fillers 2-3x over 2 minutes, increased as complexity of conversation increased, however 2-3 per utterance is improving. We generated strategies to assist with grief/depression including reaching out to friends, exercise, getting out doors. Resources of stroke support group, aphasia group, social media sights provided. See patient instructions   10/26/22: Addressed word finding with divergent naming task, with pt demonstrating ability to generate x23 words IND, additional x13 with mod semantic cues. Led pt through structured conversation to address use of word finding strategies and discourse cohesion strategies at conversational level. Cues given for stopping and thinking prior to responding to aid in cohesiveness. Occasional cues needed for use of anomia strategies. Pt with frequent fillers in conversation, mazing and false starts. Reduces when pt allows self time to formulate response prior to speaking.   10/23/22: Pt more alert during today's session, and is now coming to sessions alone because spouse has gone back to work. Pt able to name 10 words within a category during warm up activity. SLP targeted word finding with scattergories. Patient able to generate words in a given category with 40% accuracy. Required frequent mod-A of semantic and phonemic cues as well as immediate feedback to generate more responses. Patient states that word finding is going better at home, and that he is implementing the practice of semantic feature analysis, which has helped.     PATIENT EDUCATION: Education details: See Today's Treatment; aphasia ed, compensatory strategies, see patient  instructions Person educated: Patient Education method: Explanation, Demonstration, Verbal cues, and Handouts Education comprehension: verbal cues required and needs further education     GOALS: Goals reviewed with patient? Yes   SHORT TERM GOALS: Target date: 10/23/22   Pt will name 15 items in personally relevant category with rare min A Baseline: 9 items Goal status: MET   2.  Pt will use verbal compensations 4/5 word finding episodes with rare min A Baseline: 1/5 Goal status: MET   3.  Pt will generate complex sentences in structured tasks 4/5x with occasional min A Baseline: no complex sentences Goal status: MET   4.  Pt will carry over 3 strategies to compensate for auditory comprehension Baseline: No strategies Goal status: MET   LONG  TERM GOALS: Target date: 12/18/22   Pt will use verbal compensations for aphasia 5/5x in conversation with rare min A Baseline:  Goal status: MET   2.  Pt will generate complex sentences in simple conversation 4/5 turns with occasional min A Baseline: simple sentences only Goal status: MET   3.  Pt will use compensatory strategies to gain a turn in group or fast moving conversation 3x outside of therapy Baseline: no strategy for this Goal status: MET   4.  Pt will reduce fillers of "um" and "ah" to 5 or less over 5 sentences Baseline: 15+ fillers over 3 sentence narrative Goal status: ONGOING  5. Pt will role play communication interactions in work related scenarios using compensations for aphasia as needed                     Goal Status: New - added 11/06/22  6. Pt will generate 4 work related emails with no errors                Goal Status: New added 11/06/22   ASSESSMENT:   CLINICAL IMPRESSION: Patient is a 47 y.o. male who was seen today for moderate aphasia characterized by halting, fillers due to anomia. Joseph Bright continues to improve with word finding in conversation as well as structured naming tasks. He is using  verbal compensations for aphasia successfully and reports these strategies are also improving his word finding. Aphasia improved to mild to moderate. Auditory comprehension continues to improve for moderately complex conversation, however he continues to benefit from family being present to manage medical, legal and financial information/conversations. I recommend skilled ST to maximize communication for safety, independence, and return to PLOF.    OBJECTIVE IMPAIRMENTS: include aphasia. These impairments are limiting patient from return to work, ADLs/IADLs, and effectively communicating at home and in community. Factors affecting potential to achieve goals and functional outcome are financial resources. Patient will benefit from skilled SLP services to address above impairments and improve overall function.   REHAB POTENTIAL: Good   PLAN:   SLP FREQUENCY: 2x/week   SLP DURATION: 12 weeks   PLANNED INTERVENTIONS: Language facilitation, Environmental controls, Cueing hierachy, Cognitive reorganization, Internal/external aids, Functional tasks, Multimodal communication approach, and SLP instruction and feedback       Maia Breslow, CCC-SLP 11/09/2022, 8:32 AM

## 2022-11-09 NOTE — Therapy (Signed)
OUTPATIENT OCCUPATIONAL THERAPY NEURO TREATMENT  Patient Name: Joseph Bright MRN: [id number] DOB:1975/06/29, 47 y.o., male Today's Date: 11/09/2022  PCP: Noberto Retort, MD  REFERRING PROVIDER: Charlton Amor, PA-C   END OF SESSION:  OT End of Session - 11/09/22 1016     Visit Number 11    Number of Visits 17    Date for OT Re-Evaluation 11/20/22   Date adjusted as pt was not wanting to schedue visits the week of 09/18/2022   Authorization Type [employer] (60 VL - OT, PT, ST)    OT Stop Time 1016    Activity Tolerance Patient tolerated treatment well    Behavior During Therapy [facility] for tasks assessed/performed             Past Medical History:  Diagnosis Date   Hypertension    Stroke [facility])    Past Surgical History:  Procedure Laterality Date   IR CT HEAD LTD  08/07/2022   IR INTRA CRAN STENT  08/07/2022   IR PERCUTANEOUS ART THROMBECTOMY/INFUSION INTRACRANIAL INC DIAG ANGIO  07/25/2022   LOOP RECORDER INSERTION N/A 07/28/2022   Procedure: LOOP RECORDER INSERTION;  Surgeon: Regan Lemming, MD;  Location: [facility] INVASIVE CV LAB;  Service: Cardiovascular;  Laterality: N/A;   RADIOLOGY WITH ANESTHESIA N/A 07/25/2022   Procedure: RADIOLOGY WITH ANESTHESIA;  Surgeon: Radiologist, Medication, MD;  Location: [facility] OR;  Service: Radiology;  Laterality: N/A;   TEE WITHOUT CARDIOVERSION N/A 07/28/2022   Procedure: TRANSESOPHAGEAL ECHOCARDIOGRAM;  Surgeon: Parke Poisson, MD;  Location: [facility] INVASIVE CV LAB;  Service: Cardiovascular;  Laterality: N/A;   Patient Active Problem List   Diagnosis Date Noted   Loud snoring 10/18/2022   Adjustment disorder with mixed anxiety and depressed mood 08/07/2022   Cerebrovascular accident (CVA) due to occlusion of left posterior communicating artery (HCC) 08/01/2022   Acute ischemic left ACA stroke (HCC) 07/25/2022   Arterial ischemic stroke, ACA (anterior cerebral artery), left, acute (HCC) 07/25/2022   DYSLIPIDEMIA 04/24/2007   Renal failure  11/29/2006   Essential hypertension 11/12/2006    ONSET DATE: 07/25/2022  REFERRING DIAG: I61.9 (ICD-10-CM) - CVA (cerebrovascular accident due to intracerebral hemorrhage)   THERAPY DIAG:  Other lack of coordination  Muscle weakness (generalized)  Other symptoms and signs involving the nervous system  Rationale for Evaluation and Treatment: Rehabilitation  SUBJECTIVE:   SUBJECTIVE STATEMENT: Pt reports he feels his right hand is coming along nicely. He would like some tracing papers for letters and numbers to help with his handwriting.   Pt accompanied by: Self  PERTINENT HISTORY: Pt presented to ED 07/25/2022. "Cranial CT scan negative for acute changes. There was chronic appearing right thalamic lacunar infarct. 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 Status post TNK with interventional radiology for stenting. MRI follow-up showed confluent left ACA territory infarction. Cytotoxic edema with no hemorrhagic transformation. Multiple additional scattered mostly punctate ischemic foci in the left greater than right MCA, PCA and occasionally cerebellar artery territories."  Pt in IRF from 08/01/2022 -08/31/2022 then discharged to OP OT, PT, and ST.   PRECAUTIONS: Fall  WEIGHT BEARING RESTRICTIONS: No  PAIN:  Are you having pain? No  FALLS: Has patient fallen in last 6 months? No  LIVING ENVIRONMENT: Lives with: lives with their family Lives in: House/apartment Stairs: No Has following equipment at home: Dan Humphreys - 2 wheeled, Environmental consultant - 4 wheeled, Wheelchair (manual), bed side commode, and Grab bars  PLOF: Independent; full-time working for  [employer]; driving; sports (football and basketball)  PATIENT GOALS: return to PLOF  OBJECTIVE:   HAND DOMINANCE: Right  ADLs: Overall ADLs: min A  IADLs: Shopping: dependent Light housekeeping: dependent Community mobility: dependent  Handwriting: Not legible  MOBILITY STATUS: Needs  Assist: manual wheelchair  ACTIVITY TOLERANCE: Activity tolerance: fair  FUNCTIONAL OUTCOME MEASURES: FOTO: Slight: 59 10/23/2022: 79   UPPER EXTREMITY ROM:     AROM Right (eval) Left (eval)  Shoulder flexion WNL WNL  Shoulder abduction WNL WNL  Elbow flexion WNL WNL  Elbow extension WNL WNL  Wrist flexion WNL WNL  Wrist extension WNL WNL  Wrist pronation WNL WNL  Wrist supination WNL WNL   Digit Composite Flexion WNL WNL  Digit Composite Extension WNL WNL  Digit Opposition WNL WNL  (Blank rows = not tested)  RUE: IR to hip; overall bradykinesia with tremor  UPPER EXTREMITY MMT:     MMT Right (eval) Left (eval)  Shoulder flexion 4/5 WNL  Shoulder abduction 4/5 WNL  Elbow flexion 4/5 WNL  Elbow extension 4/5 WNL  (Blank rows = not tested)  HAND FUNCTION: Grip strength: Right: 38.8 lbs; Left: 82.8 lbs 10/23/2022: 57.5 lbs  COORDINATION: 9 Hole Peg test: Right: 139 sec; Left: 32 sec 10/23/2022: 59 seconds  SENSATION: Paresthesias reported in R fingertips  EDEMA: none reported or observed  MUSCLE TONE: WFL  COGNITION: Overall cognitive status: Within functional limits for tasks assessed  VISION: Subjective report: no changes Baseline vision: Wears glasses for reading only Visual history:  none  VISION ASSESSMENT: WFL  PERCEPTION: WFL  PRAXIS: WFL  OBSERVATIONS: Pt appears well-kept. He is able to propel w/c with increased time. Has RW with him.   TODAY'S TREATMENT:             Grooved pegboard with use of R for fine motor coordination.  Pt participated in game of Operation Perfection mash+ups x 3 trials with medium and hard puzzle cards for fine motor coordination, pinch strength, and item recognition. Pt placed pieces using affected right hand and grabbing pieces on the outside and with use of tweezers. Pt was able to complete medium puzzles in under 60 seconds but failed to complete hard puzzles in under 60 seconds x 2 trials.  OT initiated  letter and number tracing as well as typing.com website to work on Chiropractor and typing with RUE for improved coordination.   PATIENT EDUCATION: Education details: see above Person educated: Patient Education method: Explanation, Demonstration, and Verbal cues Education comprehension: verbalized understanding, returned demonstration, and verbal cues required  HOME EXERCISE PROGRAM: 09/12/2022: Chilton Si putty and coordination HEPs 09/27/22: Additional coordination HEP  10/04/22: shoulder girdle/scapula strengthening HEP  10/23/2022: Shoulder slides and alphabet  GOALS:  SHORT TERM GOALS: Target date: 10/10/2022    Patient will demonstrate independence with initial RUE HEP. Baseline: Goal status: MET  2.  Patient will demonstrate at least 45 lbs R grip strength as needed to open jars and other containers. Baseline: 38.8 lbs 10/23/2022: 57.5 lbs Goal status: MET  3.  Pt will report no difficulty holding a dish while washing it.  Baseline: with some difficulty 10/23/2022: has not tried recently; simulated in clinic: little difficulty Goal status: IN PROGRESS   LONG TERM GOALS: Target date: 11/17/2022    Patient will demonstrate updated RUE HEP with 25% verbal cues or less for proper execution. Baseline:  Goal status: IN PROGRESS  2.  Patient will demonstrate at least 50 lbs R grip strength as needed to open  jars and other containers. Baseline: 38.8 lbs 10/23/2022: 57.5 lbs Goal status: MET  3.  Patient will complete nine-hole peg with use of R in 45 seconds or less. Baseline: 139 seconds 10/23/2022: 59 seconds; GOAL UPDATED (< or = 60 seconds originally) Goal status: REVISED  4.  Pt will complete d/c FOTO.  Baseline: 59 10/23/2022: 79 Goal status: INITIAL   ASSESSMENT:  CLINICAL IMPRESSION: Pt progressing well towards goals but remain limited by R hand fine motor coordination. Will focus remaining visits on higher level fine motor coordination tasks.   PERFORMANCE  DEFICITS: in functional skills including ADLs, IADLs, coordination, dexterity, strength, Fine motor control, Gross motor control, mobility, endurance, and UE functional use  IMPAIRMENTS: are limiting patient from ADLs, IADLs, work, and leisure.   CO-MORBIDITIES: may have co-morbidities  that affects occupational performance. Patient will benefit from skilled OT to address above impairments and improve overall function.  REHAB POTENTIAL: Good  PLAN:  OT FREQUENCY: 2x/week  OT DURATION: 8 weeks  PLANNED INTERVENTIONS: self care/ADL training, therapeutic exercise, therapeutic activity, neuromuscular re-education, manual therapy, functional mobility training, splinting, ultrasound, fluidotherapy, patient/family education, DME and/or AE instructions, and Re-evaluation  RECOMMENDED OTHER SERVICES: Receiving PT and ST  CONSULTED AND AGREED WITH PLAN OF CARE: Patient and family member/caregiver  PLAN FOR NEXT SESSION: More weight bearing at mat table, continue coordination and forced use of RUE   Delana Meyer, OT 11/09/2022, 10:17 AM

## 2022-11-09 NOTE — Therapy (Signed)
OUTPATIENT PHYSICAL THERAPY NEURO TREATMENT  Patient Name: Joseph Bright MRN: [id number] DOB:1975/05/12, 47 y.o., male Today's Date: 11/09/2022   PCP: Noberto Retort, MD REFERRING PROVIDER: Charlton Amor, PA-C  Physical Therapy Progress Note   Dates of Reporting Period:09/25/22 - 11/06/22  See Note below for Objective Data and Assessment of Progress/Goals.    END OF SESSION:  PT End of Session - 11/09/22 0935     Visit Number 11    Number of Visits 17    Date for PT Re-Evaluation 12/18/22    Authorization Type [employer]    PT Start Time 0930    PT Stop Time 1010    PT Time Calculation (min) 40 min    Equipment Utilized During Treatment Gait belt;Other (comment)    Activity Tolerance Patient tolerated treatment well    Behavior During Therapy [facility] for tasks assessed/performed                      Past Medical History:  Diagnosis Date   Hypertension    Stroke [facility])    Past Surgical History:  Procedure Laterality Date   IR CT HEAD LTD  08/07/2022   IR INTRA CRAN STENT  08/07/2022   IR PERCUTANEOUS ART THROMBECTOMY/INFUSION INTRACRANIAL INC DIAG ANGIO  07/25/2022   LOOP RECORDER INSERTION N/A 07/28/2022   Procedure: LOOP RECORDER INSERTION;  Surgeon: Regan Lemming, MD;  Location: [facility] INVASIVE CV LAB;  Service: Cardiovascular;  Laterality: N/A;   RADIOLOGY WITH ANESTHESIA N/A 07/25/2022   Procedure: RADIOLOGY WITH ANESTHESIA;  Surgeon: Radiologist, Medication, MD;  Location: [facility] OR;  Service: Radiology;  Laterality: N/A;   TEE WITHOUT CARDIOVERSION N/A 07/28/2022   Procedure: TRANSESOPHAGEAL ECHOCARDIOGRAM;  Surgeon: Parke Poisson, MD;  Location: [facility] INVASIVE CV LAB;  Service: Cardiovascular;  Laterality: N/A;   Patient Active Problem List   Diagnosis Date Noted   Loud snoring 10/18/2022   Adjustment disorder with mixed anxiety and depressed mood 08/07/2022   Cerebrovascular accident (CVA) due to occlusion of left posterior communicating artery (HCC)  08/01/2022   Acute ischemic left ACA stroke (HCC) 07/25/2022   Arterial ischemic stroke, ACA (anterior cerebral artery), left, acute (HCC) 07/25/2022   DYSLIPIDEMIA 04/24/2007   Renal failure 11/29/2006   Essential hypertension 11/12/2006    ONSET DATE: 08/24/2022 (referral date)  REFERRING DIAG: I63.9 (ICD-10-CM) - CVA (cerebral vascular accident) (HCC)  THERAPY DIAG:  Unsteadiness on feet  Other lack of coordination  Other abnormalities of gait and mobility  Muscle weakness (generalized)  Rationale for Evaluation and Treatment: Rehabilitation  SUBJECTIVE:  SUBJECTIVE STATEMENT: Pt reports walking laps 2x/day every day since last PT visit with walker. No falls. Came in w/c today, "I can't do it" (ZO:XWRUE w/c home).  PERTINENT HISTORY:  hypertension, CKD stage IV followed by outpatient nephrology  PAIN:  Are you having pain? No  PRECAUTIONS: Fall and ICD/Pacemaker  WEIGHT BEARING RESTRICTIONS: No  FALLS: Has patient fallen in last 6 months? Yes. Number of falls 1, fell last weekend at home trying to do something by himself, he bumped his head, his ex-wife helped him to get back up after the fall  LIVING ENVIRONMENT: Lives with:  lives with ex-wife Lives in: House/apartment Stairs: No Has following equipment at home: Environmental consultant - 2 wheeled, Wheelchair (manual), shower chair, and bed side commode  PLOF: Needs assistance with ADLs, Needs assistance with homemaking, Needs assistance with gait, and Needs assistance with transfers  PATIENT GOALS: "to walk"  OBJECTIVE:   DIAGNOSTIC FINDINGS:  Brain MRI 07/26/22 IMPRESSION: 1. Confluent Left ACA territory infarct. Cytotoxic edema with no hemorrhagic transformation.   2. Multiple additional scattered mostly punctate ischemic foci in the  left greater than right MCA, PCA, and occasionally cerebellar artery territories. No significant intracranial mass effect.   3. 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   4. See intracranial MRA is reported separately today.    COGNITION: Overall cognitive status: Within functional limits for tasks assessed   SENSATION: N/T in R hemibody, impaired sensation though light touch appears intact  EDEMA:  Swelling in RLE/R foot  POSTURE: rounded shoulders, forward head, and posterior pelvic tilt   LOWER EXTREMITY MMT:    MMT Right Eval Left Eval  Hip flexion 1 5  Hip extension    Hip abduction    Hip adduction    Hip internal rotation    Hip external rotation    Knee flexion 1 5  Knee extension 1 5  Ankle dorsiflexion 0 5  Ankle plantarflexion    Ankle inversion    Ankle eversion    (Blank rows = not tested)  VITALS  Beginning of session: 134/89    TODAY'S TREATMENT:                   Gait Training  Gait pattern:  vaulting LLE but decreased from previous sessions, ER of RLE, decreased hip/knee flexion- Right, and decreased ankle dorsiflexion- Right Distance walked:  450' amb. Through waiting area, outside into parking lot and back Assistive device utilized: Walker - 2 wheeled and R AFO Level of assistance: SBA Comments: Cues for more continuous gait pattern vs. Picking up walker each step. Pt demonstrated significant improvement in gait endurance, w/decreased vaulting of LLE noted even w/fatigue. Continued to encouraged pt to attempt to walk in house rather than use WC and to attempt to come to sessions without WC in future. Pt in agreement and stated he will plan to walk into next session. NMR  In // bars for improved gait kinematics, midline orientation and BLE strength: Stretching for bil. Gastrocs on incline board in front of mirror- worked on postural alignment and progressively maintaining alignment with weight shifts to  the R then progressing to R SLS.  Fwd/retro steps with Left foot, intermittent UE support working on R LE stance phase and posture manual feedback on body position. Mod verbal cues for improved extension RLE throughout. Progressing to walking forward holding wall bar intermittently (1-0 UE support).  PATIENT EDUCATION: Education details: continue HEP, walking more at home w/AFO  Person educated: Patient Education method: Chief Technology Officer Education comprehension: verbalized understanding and needs further education  HOME EXERCISE PROGRAM: Access Code: [email or web address] URL: [email or web address] Date: 09/27/2022 Prepared by: Peter Congo  Exercises - Hooklying Heel Slide  - 1 x daily - 7 x weekly - 2 sets - 10-15 reps - Supine Bridge  - 1 x daily - 7 x weekly - 2-3 sets - 15 reps - Clamshell  - 1 x daily - 7 x weekly - 2 sets - 10 reps - Sidelying Reverse Clamshell  - 1 x daily - 7 x weekly - 2 sets - 10 reps - Long Sitting Hamstring Stretch  - 1 x daily - 7 x weekly - 1 sets - 3-5 reps - 30 sec hold - Staggered Sit-to-Stand  - 1 x daily - 7 x weekly - 3 sets - 5 reps   GOALS: Goals reviewed with patient? Yes  SHORT TERM GOALS: Target date: 10/23/2022  Pt will be independent with initial outpatient HEP for improved strength, balance, transfers and gait. Baseline: Goal status: MET  2.  Pt will improve 5 x STS to less than or equal to 30 seconds to demonstrate improved functional strength and transfer efficiency.  Baseline: 35.69 sec (7/1), 24.25 sec (7/29) Goal status: MET  3.  Pt will improve normal TUG to less than or equal to 55 seconds for improved functional mobility and decreased fall risk. Baseline: 67 sec with RW (7/1), 39.5 sec with RW (7/29) Goal status: MET  4.  Pt will improve gait velocity to at least .90 ft/sec for improved gait efficiency and performance at CGA level  Baseline: 0.67  ft/sec with RW and CGA (7/1), 0.92 ft/sec with RW and CGA Goal status: MET  5.  Pt will ambulate x 150 ft with LRAD and CGA to demonstrate improved endurance and increased independence with functional mobility. Baseline: 32.8 ft with RW and CGA, 115 ft with RW and CGA (7/29) Goal status: IN PROGRESS  6.  Pt will initiate stair training as safe and able. Baseline: 8 stairs with 1-2 handrails and min A (7/29) Goal status: MET  LONG TERM GOALS: Target date: 11/23/2022   Pt will be independent with final HEP for improved strength, balance, transfers and gait. Baseline:  Goal status: INITIAL  2.  Pt will improve 5 x STS to less than or equal to 20 seconds to demonstrate improved functional strength and transfer efficiency.  Baseline: 35.69 sec (7/1), 24.25 sec (7/29) Goal status: REVISED/UPGRADED  3.  Pt will improve normal TUG to less than or equal to 35 seconds for improved functional mobility and decreased fall risk. Baseline: 67 sec with RW (7/1), 39.5 sec with RW (7/29) Goal status: REVISED/UPGRADED  4.  Pt will improve gait velocity to at least 1.25 ft/sec for improved gait efficiency and performance at SBA level  Baseline: 0.67 ft/sec with RW and CGA (7/1), 0.92 ft/sec with RW and CGA Goal status: INITIAL  5.  Pt will ambulate x 200 ft with LRAD and SBA to demonstrate improved endurance and increased independence with functional mobility. Baseline: 32.8 ft with RW and CGA, 115 ft with RW and CGA (7/29); 345' w/RW and SBA  Goal status: MET  6.  Pt will ascend/descend 12 stairs with LRAD and min A to demonstrate increased independence with functional mobility. Baseline: not assessed at eval, 8 stairs with 1-2 handrails and min A (7/29) Goal status: INITIAL  ASSESSMENT:  CLINICAL IMPRESSION: Skilled session focused on helping Pt increase confidence in ambulating into PT center without w/c to increase functional independence and NMR training for R LE strengthening and control in  stance phase. Pt continues to be limited by R hip abduction weakness, resulting in scissoring of RLE w/gait but pt able to correct w/min verbal cues. Continue POC.    OBJECTIVE IMPAIRMENTS: Abnormal gait, cardiopulmonary status limiting activity, decreased activity tolerance, decreased balance, decreased coordination, decreased endurance, decreased mobility, difficulty walking, decreased strength, increased edema, impaired sensation, and impaired UE functional use.   ACTIVITY LIMITATIONS: carrying, lifting, bending, standing, squatting, stairs, transfers, bed mobility, bathing, toileting, dressing, reach over head, and hygiene/grooming  PARTICIPATION LIMITATIONS: meal prep, cleaning, laundry, interpersonal relationship, driving, shopping, community activity, and occupation  PERSONAL FACTORS: 1-2 comorbidities:    hypertension, CKD stage IV followed outpatient nephrology are also affecting patient's functional outcome.   REHAB POTENTIAL: Good  CLINICAL DECISION MAKING: Stable/uncomplicated  EVALUATION COMPLEXITY: Moderate  PLAN:  PT FREQUENCY: 2x/week  PT DURATION: 8 weeks  PLANNED INTERVENTIONS: Therapeutic exercises, Therapeutic activity, Neuromuscular re-education, Balance training, Gait training, Patient/Family education, Self Care, Joint mobilization, Stair training, Vestibular training, Canalith repositioning, Visual/preceptual remediation/compensation, Orthotic/Fit training, DME instructions, Aquatic Therapy, Dry Needling, Wheelchair mobility training, Cryotherapy, Moist heat, Taping, Manual therapy, and Re-evaluation  PLAN FOR NEXT SESSION:  check BP, work on RLE NMR and strengthening, gait with RW--needs to work on decreasing vaulting with his LLE when stepping with RLE (could do forward/backward steps at mat or in // bars, sit to stands with step under LLE, work on R HS strengthening, add lift to L side? Standing next to wall to block L hip); standing reaching task? RLE step-overs,  has defibrillator so no e-stim    Hortencia Conradi, PTA  11/09/22, 11:34 AM

## 2022-11-09 NOTE — Patient Instructions (Signed)
Work tasks: Building services engineer work schedule - use computer calendar to add to their calendar.  Making their appointments throughout the day Ensuring they get off on time Work emails  In-house, employees Customers  [employer]  Proofreader for [employer] -- power hoyers, WC, rollators, upright walkers, Government social research officer delivery of required items to customers Via phone or email Have to coordinate their needs with tech or my availability  Read parts sheets See what parts people already have on chair  Call to [facility] company and provide [employer] upgrades listed on parts sheet  Maintain filing system  Parts delivered Parts received New chairs

## 2022-11-10 ENCOUNTER — Telehealth: Payer: Self-pay

## 2022-11-10 NOTE — Telephone Encounter (Signed)
Parke Poisson, MD  Bernita Buffy, RN We started him on a pretty hefty dose of carvedilol if in fact he is not taking labetalol. Can you call him and inquire if he was compliant with labetalol otherwise I worry the dose of carvedilol may be too much. I asked him in the office and he said he was taking. GA  Left message for pt to call back.

## 2022-11-13 ENCOUNTER — Ambulatory Visit: Payer: Managed Care, Other (non HMO) | Admitting: Physical Therapy

## 2022-11-13 ENCOUNTER — Ambulatory Visit: Payer: Managed Care, Other (non HMO) | Admitting: Speech Pathology

## 2022-11-13 ENCOUNTER — Ambulatory Visit: Payer: Managed Care, Other (non HMO)

## 2022-11-13 ENCOUNTER — Encounter: Payer: Self-pay | Admitting: Speech Pathology

## 2022-11-13 ENCOUNTER — Ambulatory Visit: Payer: Managed Care, Other (non HMO) | Admitting: Occupational Therapy

## 2022-11-13 VITALS — BP 147/84 | HR 77

## 2022-11-13 DIAGNOSIS — M6281 Muscle weakness (generalized): Secondary | ICD-10-CM

## 2022-11-13 DIAGNOSIS — R29818 Other symptoms and signs involving the nervous system: Secondary | ICD-10-CM

## 2022-11-13 DIAGNOSIS — R2681 Unsteadiness on feet: Secondary | ICD-10-CM

## 2022-11-13 DIAGNOSIS — R208 Other disturbances of skin sensation: Secondary | ICD-10-CM

## 2022-11-13 DIAGNOSIS — R2689 Other abnormalities of gait and mobility: Secondary | ICD-10-CM

## 2022-11-13 DIAGNOSIS — I63522 Cerebral infarction due to unspecified occlusion or stenosis of left anterior cerebral artery: Secondary | ICD-10-CM

## 2022-11-13 DIAGNOSIS — R278 Other lack of coordination: Secondary | ICD-10-CM

## 2022-11-13 DIAGNOSIS — R4701 Aphasia: Secondary | ICD-10-CM

## 2022-11-13 LAB — CUP PACEART REMOTE DEVICE CHECK
Date Time Interrogation Session: 20240818231348
Implantable Pulse Generator Implant Date: 20240503

## 2022-11-13 NOTE — Therapy (Signed)
OUTPATIENT PHYSICAL THERAPY NEURO TREATMENT  Patient Name: Joseph Bright MRN: [id number] DOB:11-27-1975, 47 y.o., male Today's Date: 11/13/2022   PCP: Noberto Retort, MD REFERRING PROVIDER: Charlton Amor, PA-C    END OF SESSION:  PT End of Session - 11/13/22 0937     Visit Number 12    Number of Visits 17    Date for PT Re-Evaluation 12/18/22    Authorization Type [employer]    PT Start Time [phone]   pt in restroom   PT Stop Time 1015    PT Time Calculation (min) 38 min    Equipment Utilized During Treatment Gait belt    Activity Tolerance Patient tolerated treatment well    Behavior During Therapy [facility] for tasks assessed/performed                       Past Medical History:  Diagnosis Date   Hypertension    Stroke [facility])    Past Surgical History:  Procedure Laterality Date   IR CT HEAD LTD  08/07/2022   IR INTRA CRAN STENT  08/07/2022   IR PERCUTANEOUS ART THROMBECTOMY/INFUSION INTRACRANIAL INC DIAG ANGIO  07/25/2022   LOOP RECORDER INSERTION N/A 07/28/2022   Procedure: LOOP RECORDER INSERTION;  Surgeon: Regan Lemming, MD;  Location: [facility] INVASIVE CV LAB;  Service: Cardiovascular;  Laterality: N/A;   RADIOLOGY WITH ANESTHESIA N/A 07/25/2022   Procedure: RADIOLOGY WITH ANESTHESIA;  Surgeon: Radiologist, Medication, MD;  Location: [facility] OR;  Service: Radiology;  Laterality: N/A;   TEE WITHOUT CARDIOVERSION N/A 07/28/2022   Procedure: TRANSESOPHAGEAL ECHOCARDIOGRAM;  Surgeon: Parke Poisson, MD;  Location: [facility] INVASIVE CV LAB;  Service: Cardiovascular;  Laterality: N/A;   Patient Active Problem List   Diagnosis Date Noted   Loud snoring 10/18/2022   Adjustment disorder with mixed anxiety and depressed mood 08/07/2022   Cerebrovascular accident (CVA) due to occlusion of left posterior communicating artery (HCC) 08/01/2022   Acute ischemic left ACA stroke (HCC) 07/25/2022   Arterial ischemic stroke, ACA (anterior cerebral artery), left, acute (HCC) 07/25/2022    DYSLIPIDEMIA 04/24/2007   Renal failure 11/29/2006   Essential hypertension 11/12/2006    ONSET DATE: 08/24/2022 (referral date)  REFERRING DIAG: I63.9 (ICD-10-CM) - CVA (cerebral vascular accident) (HCC)  THERAPY DIAG:  Muscle weakness (generalized)  Unsteadiness on feet  Other abnormalities of gait and mobility  Rationale for Evaluation and Treatment: Rehabilitation  SUBJECTIVE:  SUBJECTIVE STATEMENT: Pt enters clinic walking with his RW this date, no w/c! Pt reports he did some walking outside with his RW across some uneven sidewalks at home. Pt does still use the w/c in the house occasionally.  PERTINENT HISTORY:  hypertension, CKD stage IV followed by outpatient nephrology  PAIN:  Are you having pain? No  PRECAUTIONS: Fall and ICD/Pacemaker  WEIGHT BEARING RESTRICTIONS: No  FALLS: Has patient fallen in last 6 months? Yes. Number of falls 1, fell last weekend at home trying to do something by himself, he bumped his head, his ex-wife helped him to get back up after the fall  LIVING ENVIRONMENT: Lives with:  lives with ex-wife Lives in: House/apartment Stairs: No Has following equipment at home: Environmental consultant - 2 wheeled, Wheelchair (manual), shower chair, and bed side commode  PLOF: Needs assistance with ADLs, Needs assistance with homemaking, Needs assistance with gait, and Needs assistance with transfers  PATIENT GOALS: "to walk"  OBJECTIVE:   DIAGNOSTIC FINDINGS:  Brain MRI 07/26/22 IMPRESSION: 1. Confluent Left ACA territory infarct. Cytotoxic edema with no hemorrhagic transformation.   2. Multiple additional scattered mostly punctate ischemic foci in the left greater than right MCA, PCA, and occasionally cerebellar artery territories. No significant intracranial mass effect.    3. 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   4. See intracranial MRA is reported separately today.    COGNITION: Overall cognitive status: Within functional limits for tasks assessed   SENSATION: N/T in R hemibody, impaired sensation though light touch appears intact  EDEMA:  Swelling in RLE/R foot  POSTURE: rounded shoulders, forward head, and posterior pelvic tilt   LOWER EXTREMITY MMT:    MMT Right Eval Left Eval  Hip flexion 1 5  Hip extension    Hip abduction    Hip adduction    Hip internal rotation    Hip external rotation    Knee flexion 1 5  Knee extension 1 5  Ankle dorsiflexion 0 5  Ankle plantarflexion    Ankle inversion    Ankle eversion    (Blank rows = not tested)     TODAY'S TREATMENT:          VITALS Vitals:   11/13/22 0941  BP: (!) 147/84  Pulse: 77              Gait Training  Gait pattern:  vaulting LLE but decreased from previous sessions, ER of RLE, decreased hip/knee flexion- Right, and decreased ankle dorsiflexion- Right Distance walked:  various clinic distances, from parking lot to clinic lobby and in/out of therapy appointment Assistive device utilized: Walker - 2 wheeled and R AFO Level of assistance: SBA Comments: decreased vaulting noted with LLE, improved ability to perform functional mobility with ambulation in/out of appt from the car without use of w/c   TherAct In // bars to work on increasing R hip strength and limb clearance during gait with SBA to CGA: Forward/backward steps with RLE over pole (difficulty with hip ext, deferred further repetitions) Forward/backward steps with LLE over pole 2 x 10 reps with focus on progression from BUE support to 1 UE support Forward steps with RLE over pole with gait, difficulty clearing RLE with onset of fatigue without vaulting with LLE; 6 x 10 ft Lateral steps with RLE over pole with regression to no pole due to difficulty clearing limb  2 x 10 reps Lateral steps with LLE over pole 2 x 15 reps Lateral sidestepping L/R 2  x 10 ft each direction   PATIENT EDUCATION: Education details: continue HEP, walking more at home w/AFO, continue to decrease reliance on RW, PT POC with plan to d/c at end of this POC due to limited visits with insurance and pt with difficulty with transportation  Person educated: Patient Education method: Explanation Education comprehension: verbalized understanding and needs further education  HOME EXERCISE PROGRAM: Access Code: [email or web address] URL: [email or web address] Date: 09/27/2022 Prepared by: Peter Congo  Exercises - Hooklying Heel Slide  - 1 x daily - 7 x weekly - 2 sets - 10-15 reps - Supine Bridge  - 1 x daily - 7 x weekly - 2-3 sets - 15 reps - Clamshell  - 1 x daily - 7 x weekly - 2 sets - 10 reps - Sidelying Reverse Clamshell  - 1 x daily - 7 x weekly - 2 sets - 10 reps - Long Sitting Hamstring Stretch  - 1 x daily - 7 x weekly - 1 sets - 3-5 reps - 30 sec hold - Staggered Sit-to-Stand  - 1 x daily - 7 x weekly - 3 sets - 5 reps  - Side-stepping at [place]   GOALS: Goals reviewed with patient? Yes  SHORT TERM GOALS: Target date: 10/23/2022  Pt will be independent with initial outpatient HEP for improved strength, balance, transfers and gait. Baseline: Goal status: MET  2.  Pt will improve 5 x STS to less than or equal to 30 seconds to demonstrate improved functional strength and transfer efficiency.  Baseline: 35.69 sec (7/1), 24.25 sec (7/29) Goal status: MET  3.  Pt will improve normal TUG to less than or equal to 55 seconds for improved functional mobility and decreased fall risk. Baseline: 67 sec with RW (7/1), 39.5 sec with RW (7/29) Goal status: MET  4.  Pt will improve gait velocity to at least .90 ft/sec for improved gait efficiency and performance at CGA level  Baseline: 0.67 ft/sec with RW and CGA (7/1), 0.92 ft/sec with RW and CGA Goal status:  MET  5.  Pt will ambulate x 150 ft with LRAD and CGA to demonstrate improved endurance and increased independence with functional mobility. Baseline: 32.8 ft with RW and CGA, 115 ft with RW and CGA (7/29) Goal status: IN PROGRESS  6.  Pt will initiate stair training as safe and able. Baseline: 8 stairs with 1-2 handrails and min A (7/29) Goal status: MET  LONG TERM GOALS: Target date: 11/23/2022   Pt will be independent with final HEP for improved strength, balance, transfers and gait. Baseline:  Goal status: INITIAL  2.  Pt will improve 5 x STS to less than or equal to 20 seconds to demonstrate improved functional strength and transfer efficiency.  Baseline: 35.69 sec (7/1), 24.25 sec (7/29) Goal status: REVISED/UPGRADED  3.  Pt will improve normal TUG to less than or equal to 35 seconds for improved functional mobility and decreased fall risk. Baseline: 67 sec with RW (7/1), 39.5 sec with RW (7/29) Goal status: REVISED/UPGRADED  4.  Pt will improve gait velocity to at least 1.25 ft/sec for improved gait efficiency and performance at SBA level  Baseline: 0.67 ft/sec with RW and CGA (7/1), 0.92 ft/sec with RW and CGA Goal status: INITIAL  5.  Pt will ambulate x 200 ft with LRAD and SBA to demonstrate improved endurance and increased independence with functional mobility. Baseline: 32.8 ft with RW and CGA, 115 ft with RW and CGA (7/29); 345' w/RW and SBA Goal status:  MET  6.  Pt will ascend/descend 12 stairs with LRAD and min A to demonstrate increased independence with functional mobility. Baseline: not assessed at eval, 8 stairs with 1-2 handrails and min A (7/29) Goal status: INITIAL  ASSESSMENT:  CLINICAL IMPRESSION: Emphasis of skilled PT session on continuing to work on RLE NMR and LE strengthening with focus on R hip abd strengthening in order to improve gait mechanics. Pt exhibits improved endurance for gait training this date and improved functional mobility with  ability to ambulate in/out of therapy sessions with his RW instead of using his w/c this date. Pt does continue to exhibit RLE weakness especially in his glutes and continues to benefit from skilled therapy services to work towards increasing his safety and independence with functional mobility. Continue POC.     OBJECTIVE IMPAIRMENTS: Abnormal gait, cardiopulmonary status limiting activity, decreased activity tolerance, decreased balance, decreased coordination, decreased endurance, decreased mobility, difficulty walking, decreased strength, increased edema, impaired sensation, and impaired UE functional use.   ACTIVITY LIMITATIONS: carrying, lifting, bending, standing, squatting, stairs, transfers, bed mobility, bathing, toileting, dressing, reach over head, and hygiene/grooming  PARTICIPATION LIMITATIONS: meal prep, cleaning, laundry, interpersonal relationship, driving, shopping, community activity, and occupation  PERSONAL FACTORS: 1-2 comorbidities:    hypertension, CKD stage IV followed outpatient nephrology are also affecting patient's functional outcome.   REHAB POTENTIAL: Good  CLINICAL DECISION MAKING: Stable/uncomplicated  EVALUATION COMPLEXITY: Moderate  PLAN:  PT FREQUENCY: 2x/week  PT DURATION: 8 weeks  PLANNED INTERVENTIONS: Therapeutic exercises, Therapeutic activity, Neuromuscular re-education, Balance training, Gait training, Patient/Family education, Self Care, Joint mobilization, Stair training, Vestibular training, Canalith repositioning, Visual/preceptual remediation/compensation, Orthotic/Fit training, DME instructions, Aquatic Therapy, Dry Needling, Wheelchair mobility training, Cryotherapy, Moist heat, Taping, Manual therapy, and Re-evaluation  PLAN FOR NEXT SESSION:  check BP, work on RLE NMR and strengthening, gait with RW--needs to work on decreasing vaulting with his LLE when stepping with RLE (could do forward/backward steps at mat or in // bars, sit to stands  with step under LLE, work on R HS strengthening, add lift to L side? Standing next to wall to block L hip); standing reaching task? RLE step-overs, has defibrillator so no e-stim  Plan to d/c at end of this POC due to limited visits with insurance and lack of transportation; add to HEP (RLE SLS or stance with LLE on small step performing standing task)    Peter Congo, PT, DPT, CSRS  11/13/22, 10:16 AM

## 2022-11-13 NOTE — Therapy (Signed)
OUTPATIENT SPEECH LANGUAGE PATHOLOGY TREATMENT NOTE   Patient Name: Joseph Bright MRN: [id number] DOB:1975/08/11, 47 y.o., male Today's Date: 11/13/2022  PCP: Joseph Retort, MD REFERRING PROVIDER: Charlton Amor, PA-C  END OF SESSION:   End of Session - 11/13/22 1101     Visit Number 12    Number of Visits 25    Date for SLP Re-Evaluation 12/18/22    Authorization Type cign[employer] medicaid - 60 combined - each discipline counts as 1 visit -  physical is his primary concern    SLP Start Time 1100    SLP Stop Time  1145    SLP Time Calculation (min) 45 min    Activity Tolerance Patient tolerated treatment well               Past Medical History:  Diagnosis Date   Hypertension    Stroke [facility])    Past Surgical History:  Procedure Laterality Date   IR CT HEAD LTD  08/07/2022   IR INTRA CRAN STENT  08/07/2022   IR PERCUTANEOUS ART THROMBECTOMY/INFUSION INTRACRANIAL INC DIAG ANGIO  07/25/2022   LOOP RECORDER INSERTION N/A 07/28/2022   Procedure: LOOP RECORDER INSERTION;  Surgeon: Regan Lemming, MD;  Location: [facility] INVASIVE CV LAB;  Service: Cardiovascular;  Laterality: N/A;   RADIOLOGY WITH ANESTHESIA N/A 07/25/2022   Procedure: RADIOLOGY WITH ANESTHESIA;  Surgeon: Radiologist, Medication, MD;  Location: [facility] OR;  Service: Radiology;  Laterality: N/A;   TEE WITHOUT CARDIOVERSION N/A 07/28/2022   Procedure: TRANSESOPHAGEAL ECHOCARDIOGRAM;  Surgeon: Parke Poisson, MD;  Location: [facility] INVASIVE CV LAB;  Service: Cardiovascular;  Laterality: N/A;   Patient Active Problem List   Diagnosis Date Noted   Loud snoring 10/18/2022   Adjustment disorder with mixed anxiety and depressed mood 08/07/2022   Cerebrovascular accident (CVA) due to occlusion of left posterior communicating artery (HCC) 08/01/2022   Acute ischemic left ACA stroke (HCC) 07/25/2022   Arterial ischemic stroke, ACA (anterior cerebral artery), left, acute (HCC) 07/25/2022   DYSLIPIDEMIA 04/24/2007   Renal  failure 11/29/2006   Essential hypertension 11/12/2006    ONSET DATE: 07/25/22  REFERRING DIAG: I61.9 (ICD-10-CM) - CVA (cerebrovascular accident due to intracerebral hemorrhage) (HCC)  THERAPY DIAG:  Aphasia  Rationale for Evaluation and Treatment: Rehabilitation  SUBJECTIVE:   SUBJECTIVE STATEMENT: "I was busy this weekend"  PAIN: Are you having pain? No  OBJECTIVE:   TODAY'S TREATMENT:                                                                                                                                         DATE:   11/13/22: Joseph Bright did not re-send email over the weekend, he required extended time to attempt to pull up his sent email, we decided to complete this for home work. Targeted detailed conversation re: [employer] - with halting and some fillers, [name] described the elimination  rules and how each team progresses with 2 episodes of word finding where [name] stated "I'm looking for the word" He used compensations as needed. 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   11/09/22: Pt completed HEP, sent to emails to treating SLPs. Demonstration of wheelchair parts sheet and sample email for tech scheduling. Error ID on latter email. SLP A pt in generating strategy of creating checklist to ensure completeness of of x2 types of emails. Pt generates list of x5 work tasks previously performed.  Given list of wheel chair parts, pt able to read name and then describe x4 parts. Response to questions from SLP re: named parts with pt successful Usual fillers and anomia evidenced throughout conversation, with occasional cues to use description when anomia occurs. Some vagueness exhibited, requiring cues from SLP to elicit more concrete language to relay thoughts/ideas to listener unfamiliar with topic.    11/06/22: Joseph Bright reports consistent use of verbal compensations and self advocacy with family and friends. He relays a situation where he used verbal compensation successfully. His primary concern is communication at work. Added goals re: this. Role played phone calls for wheel chair service, scheduling service and describing the most common service issues with power chairs with occasional min A and extended time Naming technical parts of manual wheelchair with extended time, supervision cues. Fillers reduced but remain. [name] is not bothered by fillers as they have decreased. [name] will send his SLP's an email re: setting up a service call with a client.   8/5/24[name] reports some depression and loneliness since being out of work. Targeted compensations for aphasia and fillers/mazing in conversation  using slow rate, descriptions, self advocating. Slow rate and self advocating with supervision cues. Initially fillers 2-3x over 2 minutes, increased as complexity of conversation increased, however 2-3 per utterance is improving. We generated strategies to assist with grief/depression including reaching out to friends, exercise, getting out doors. Resources of stroke support group, aphasia group, social media sights provided. See patient instructions   10/26/22: Addressed word finding with divergent naming task, with pt demonstrating ability to generate x23 words IND, additional x13 with mod semantic cues. Led pt through structured conversation to address use of word finding strategies and discourse cohesion strategies at conversational level. Cues given for stopping and thinking prior to responding to aid in cohesiveness. Occasional cues needed for use of anomia strategies. Pt with frequent fillers in conversation, mazing and false starts. Reduces when pt allows self time to formulate response prior to speaking.   10/23/22: Pt more alert during today's session, and is now coming to sessions alone  because spouse has gone back to work. Pt able to name 10 words within a category during warm up activity. SLP targeted word finding with scattergories. Patient able to generate words in a given category with 40% accuracy. Required frequent mod-A of semantic and phonemic cues as well as immediate feedback to generate more responses. Patient states that word finding is going better at home, and that he is implementing the practice of semantic feature analysis, which has helped.     PATIENT EDUCATION: Education details: See Today's Treatment; aphasia ed, compensatory strategies, see patient instructions Person educated: Patient Education method: Explanation, Demonstration, Verbal cues, and Handouts Education comprehension: verbal cues required and needs further education     GOALS: Goals reviewed  with patient? Yes   SHORT TERM GOALS: Target date: 10/23/22   Pt will name 15 items in personally relevant category with rare min A Baseline: 9 items Goal status: MET   2.  Pt will use verbal compensations 4/5 word finding episodes with rare min A Baseline: 1/5 Goal status: MET   3.  Pt will generate complex sentences in structured tasks 4/5x with occasional min A Baseline: no complex sentences Goal status: MET   4.  Pt will carry over 3 strategies to compensate for auditory comprehension Baseline: No strategies Goal status: MET   LONG TERM GOALS: Target date: 12/18/22   Pt will use verbal compensations for aphasia 5/5x in conversation with rare min A Baseline:  Goal status: MET   2.  Pt will generate complex sentences in simple conversation 4/5 turns with occasional min A Baseline: simple sentences only Goal status: MET   3.  Pt will use compensatory strategies to gain a turn in group or fast moving conversation 3x outside of therapy Baseline: no strategy for this Goal status: MET   4.  Pt will reduce fillers of "um" and "ah" to 5 or less over 5 sentences Baseline: 15+ fillers over  3 sentence narrative Goal status: ONGOING  5. Pt will role play communication interactions in work related scenarios using compensations for aphasia as needed                     Goal Status: New - added 11/06/22  6. Pt will generate 4 work related emails with no errors                Goal Status: New added 11/06/22   ASSESSMENT:   CLINICAL IMPRESSION: Patient is a 47 y.o. male who was seen today for moderate aphasia characterized by halting, fillers due to anomia. Parrish continues to improve with word finding in conversation as well as structured naming tasks. He is using verbal compensations for aphasia successfully and reports these strategies are also improving his word finding. Aphasia improved to mild to moderate. Auditory comprehension continues to improve for moderately complex conversation, however he continues to benefit from family being present to manage medical, legal and financial information/conversations. I recommend skilled ST to maximize communication for safety, independence, and return to PLOF.    OBJECTIVE IMPAIRMENTS: include aphasia. These impairments are limiting patient from return to work, ADLs/IADLs, and effectively communicating at home and in community. Factors affecting potential to achieve goals and functional outcome are financial resources. Patient will benefit from skilled SLP services to address above impairments and improve overall function.   REHAB POTENTIAL: Good   PLAN:   SLP FREQUENCY: 2x/week   SLP DURATION: 12 weeks   PLANNED INTERVENTIONS: Language facilitation, Environmental controls, Cueing hierachy, Cognitive reorganization, Internal/external aids, Functional tasks, Multimodal communication approach, and SLP instruction and feedback       Santita Hunsberger, Radene Journey, CCC-SLP 11/13/2022, 11:44 AM

## 2022-11-13 NOTE — Therapy (Signed)
OUTPATIENT OCCUPATIONAL THERAPY NEURO TREATMENT  Patient Name: Joseph Bright MRN: [id number] DOB:1975/06/08, 47 y.o., male Today's Date: 11/13/2022  PCP: Noberto Retort, MD  REFERRING PROVIDER: Charlton Amor, PA-C   END OF SESSION:  OT End of Session - 11/13/22 1015     Visit Number 12    Number of Visits 17    Date for OT Re-Evaluation 11/20/22    Authorization Type [employer] (60 VL - OT, PT, ST)    OT Start Time 1017    OT Stop Time 1056    OT Time Calculation (min) 39 min    Activity Tolerance Patient tolerated treatment well    Behavior During Therapy WFL for tasks assessed/performed             Past Medical History:  Diagnosis Date   Hypertension    Stroke [facility])    Past Surgical History:  Procedure Laterality Date   IR CT HEAD LTD  08/07/2022   IR INTRA CRAN STENT  08/07/2022   IR PERCUTANEOUS ART THROMBECTOMY/INFUSION INTRACRANIAL INC DIAG ANGIO  07/25/2022   LOOP RECORDER INSERTION N/A 07/28/2022   Procedure: LOOP RECORDER INSERTION;  Surgeon: Regan Lemming, MD;  Location: [facility] INVASIVE CV LAB;  Service: Cardiovascular;  Laterality: N/A;   RADIOLOGY WITH ANESTHESIA N/A 07/25/2022   Procedure: RADIOLOGY WITH ANESTHESIA;  Surgeon: Radiologist, Medication, MD;  Location: [facility] OR;  Service: Radiology;  Laterality: N/A;   TEE WITHOUT CARDIOVERSION N/A 07/28/2022   Procedure: TRANSESOPHAGEAL ECHOCARDIOGRAM;  Surgeon: Parke Poisson, MD;  Location: [facility] INVASIVE CV LAB;  Service: Cardiovascular;  Laterality: N/A;   Patient Active Problem List   Diagnosis Date Noted   Loud snoring 10/18/2022   Adjustment disorder with mixed anxiety and depressed mood 08/07/2022   Cerebrovascular accident (CVA) due to occlusion of left posterior communicating artery (HCC) 08/01/2022   Acute ischemic left ACA stroke (HCC) 07/25/2022   Arterial ischemic stroke, ACA (anterior cerebral artery), left, acute (HCC) 07/25/2022   DYSLIPIDEMIA 04/24/2007   Renal failure 11/29/2006    Essential hypertension 11/12/2006    ONSET DATE: 07/25/2022  REFERRING DIAG: I61.9 (ICD-10-CM) - CVA (cerebrovascular accident due to intracerebral hemorrhage)   THERAPY DIAG:  Muscle weakness (generalized)  Other lack of coordination  Other symptoms and signs involving the nervous system  Other disturbances of skin sensation  Arterial ischemic stroke, ACA (anterior cerebral artery), left, acute (HCC)  Rationale for Evaluation and Treatment: Rehabilitation  SUBJECTIVE:   SUBJECTIVE STATEMENT: Pt reports he felt comfortable enough to go without his wheelchair today.   Pt accompanied by: Self  PERTINENT HISTORY: Pt presented to ED 07/25/2022. "Cranial CT scan negative for acute changes. There was chronic appearing right thalamic lacunar infarct. 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 Status post TNK with interventional radiology for stenting. MRI follow-up showed confluent left ACA territory infarction. Cytotoxic edema with no hemorrhagic transformation. Multiple additional scattered mostly punctate ischemic foci in the left greater than right MCA, PCA and occasionally cerebellar artery territories."  Pt in IRF from 08/01/2022 -08/31/2022 then discharged to OP OT, PT, and ST.   PRECAUTIONS: Fall  WEIGHT BEARING RESTRICTIONS: No  PAIN:  Are you having pain? No  FALLS: Has patient fallen in last 6 months? No  LIVING ENVIRONMENT: Lives with: lives with their family Lives in: House/apartment Stairs: No Has following equipment at home: Dan Humphreys - 2 wheeled, Environmental consultant - 4 wheeled, Wheelchair (manual), bed side commode, and Grab bars  PLOF:  Independent; full-time working for [employer]; driving; sports (football and basketball)  PATIENT GOALS: return to PLOF  OBJECTIVE:   HAND DOMINANCE: Right  ADLs: Overall ADLs: min A  IADLs: Shopping: dependent Light housekeeping: dependent Community mobility: dependent  Handwriting: Not  legible  MOBILITY STATUS: Needs Assist: manual wheelchair  ACTIVITY TOLERANCE: Activity tolerance: fair  FUNCTIONAL OUTCOME MEASURES: FOTO: Slight: 59 10/23/2022: 79   UPPER EXTREMITY ROM:     AROM Right (eval) Left (eval)  Shoulder flexion WNL WNL  Shoulder abduction WNL WNL  Elbow flexion WNL WNL  Elbow extension WNL WNL  Wrist flexion WNL WNL  Wrist extension WNL WNL  Wrist pronation WNL WNL  Wrist supination WNL WNL   Digit Composite Flexion WNL WNL  Digit Composite Extension WNL WNL  Digit Opposition WNL WNL  (Blank rows = not tested)  RUE: IR to hip; overall bradykinesia with tremor  UPPER EXTREMITY MMT:     MMT Right (eval) Left (eval)  Shoulder flexion 4/5 WNL  Shoulder abduction 4/5 WNL  Elbow flexion 4/5 WNL  Elbow extension 4/5 WNL  (Blank rows = not tested)  HAND FUNCTION: Grip strength: Right: 38.8 lbs; Left: 82.8 lbs 10/23/2022: 57.5 lbs  COORDINATION: 9 Hole Peg test: Right: 139 sec; Left: 32 sec 10/23/2022: 59 seconds  SENSATION: Paresthesias reported in R fingertips  EDEMA: none reported or observed  MUSCLE TONE: WFL  COGNITION: Overall cognitive status: Within functional limits for tasks assessed  VISION: Subjective report: no changes Baseline vision: Wears glasses for reading only Visual history:  none  VISION ASSESSMENT: WFL  PERCEPTION: WFL  PRAXIS: WFL  OBSERVATIONS: Pt appears well-kept. He is able to propel w/c with increased time. Has RW with him.   TODAY'S TREATMENT:             Pt participated in game of Operation Perfection mash+ups x 6 trials with hard puzzle cards for fine motor coordination, pinch strength, and item recognition. Pt placed pieces using affected right hand and grabbing pieces on the outside. Pt was able to complete 1 hard puzzle in under 60 seconds with setup of pieces in order.  With use of R, pt placed and then removed colored, small pegs into corresponding hole with use of pattern sheets for  ROM, coordination, and strength of affected extremity. Pt picked up 5 pegs, stored them in hand, and then placed pegs one at a time using 2 point pinch for additional fine motor challenge.  PATIENT EDUCATION: Education details: see above Person educated: Patient Education method: Explanation, Demonstration, and Verbal cues Education comprehension: verbalized understanding, returned demonstration, and verbal cues required  HOME EXERCISE PROGRAM: 09/12/2022: Chilton Si putty and coordination HEPs 09/27/22: Additional coordination HEP  10/04/22: shoulder girdle/scapula strengthening HEP  10/23/2022: Shoulder slides and alphabet  GOALS:  SHORT TERM GOALS: Target date: 10/10/2022    Patient will demonstrate independence with initial RUE HEP. Baseline: Goal status: MET  2.  Patient will demonstrate at least 45 lbs R grip strength as needed to open jars and other containers. Baseline: 38.8 lbs 10/23/2022: 57.5 lbs Goal status: MET  3.  Pt will report no difficulty holding a dish while washing it.  Baseline: with some difficulty 10/23/2022: has not tried recently; simulated in clinic: little difficulty Goal status: IN PROGRESS   LONG TERM GOALS: Target date: 11/17/2022    Patient will demonstrate updated RUE HEP with 25% verbal cues or less for proper execution. Baseline:  Goal status: IN PROGRESS  2.  Patient will demonstrate  at least 50 lbs R grip strength as needed to open jars and other containers. Baseline: 38.8 lbs 10/23/2022: 57.5 lbs Goal status: MET  3.  Patient will complete nine-hole peg with use of R in 45 seconds or less. Baseline: 139 seconds 10/23/2022: 59 seconds; GOAL UPDATED (< or = 60 seconds originally) Goal status: REVISED  4.  Pt will complete d/c FOTO.  Baseline: 59 10/23/2022: 79 Goal status: INITIAL   ASSESSMENT:  CLINICAL IMPRESSION: Pt demonstrates improved dynamic standing balance today as evidenced by the use of RW and without his manual wheelchair. Pt  progressing well towards goals but remains limited by R hand fine motor coordination. Will focus remaining visits on higher level fine motor coordination tasks.   PERFORMANCE DEFICITS: in functional skills including ADLs, IADLs, coordination, dexterity, strength, Fine motor control, Gross motor control, mobility, endurance, and UE functional use  IMPAIRMENTS: are limiting patient from ADLs, IADLs, work, and leisure.   CO-MORBIDITIES: may have co-morbidities  that affects occupational performance. Patient will benefit from skilled OT to address above impairments and improve overall function.  REHAB POTENTIAL: Good  PLAN:  OT FREQUENCY: 2x/week  OT DURATION: 8 weeks  PLANNED INTERVENTIONS: self care/ADL training, therapeutic exercise, therapeutic activity, neuromuscular re-education, manual therapy, functional mobility training, splinting, ultrasound, fluidotherapy, patient/family education, DME and/or AE instructions, and Re-evaluation  RECOMMENDED OTHER SERVICES: Receiving PT and ST  CONSULTED AND AGREED WITH PLAN OF CARE: Patient and family member/caregiver  PLAN FOR NEXT SESSION: More weight bearing at mat table, continue coordination and forced use of RUE; writing exercises?   Delana Meyer, OT 11/13/2022, 10:44 AM

## 2022-11-16 ENCOUNTER — Ambulatory Visit: Payer: Managed Care, Other (non HMO) | Admitting: Physical Therapy

## 2022-11-16 ENCOUNTER — Ambulatory Visit: Payer: Managed Care, Other (non HMO) | Admitting: Occupational Therapy

## 2022-11-16 ENCOUNTER — Ambulatory Visit: Payer: Managed Care, Other (non HMO) | Admitting: Speech Pathology

## 2022-11-16 VITALS — BP 138/90 | HR 65

## 2022-11-16 DIAGNOSIS — R208 Other disturbances of skin sensation: Secondary | ICD-10-CM

## 2022-11-16 DIAGNOSIS — R2681 Unsteadiness on feet: Secondary | ICD-10-CM

## 2022-11-16 DIAGNOSIS — R278 Other lack of coordination: Secondary | ICD-10-CM

## 2022-11-16 DIAGNOSIS — I63522 Cerebral infarction due to unspecified occlusion or stenosis of left anterior cerebral artery: Secondary | ICD-10-CM

## 2022-11-16 DIAGNOSIS — M6281 Muscle weakness (generalized): Secondary | ICD-10-CM

## 2022-11-16 DIAGNOSIS — R2689 Other abnormalities of gait and mobility: Secondary | ICD-10-CM

## 2022-11-16 DIAGNOSIS — R29818 Other symptoms and signs involving the nervous system: Secondary | ICD-10-CM

## 2022-11-16 DIAGNOSIS — R4701 Aphasia: Secondary | ICD-10-CM

## 2022-11-16 NOTE — Therapy (Signed)
OUTPATIENT PHYSICAL THERAPY NEURO TREATMENT  Patient Name: Joseph Bright MRN: [id number] DOB:[birth year], 47 y.o., male Today's Date: 11/16/2022   PCP: Noberto Retort, MD REFERRING PROVIDER: Charlton Amor, PA-C    END OF SESSION:  PT End of Session - 11/16/22 0932     Visit Number 13    Number of Visits 17    Date for PT Re-Evaluation 12/18/22    Authorization Type [employer]    PT Start Time 0930    PT Stop Time 1013    PT Time Calculation (min) 43 min    Equipment Utilized During Treatment Gait belt    Activity Tolerance Patient tolerated treatment well    Behavior During Therapy WFL for tasks assessed/performed                        Past Medical History:  Diagnosis Date   Hypertension    Stroke [facility])    Past Surgical History:  Procedure Laterality Date   IR CT HEAD LTD  08/07/2022   IR INTRA CRAN STENT  08/07/2022   IR PERCUTANEOUS ART THROMBECTOMY/INFUSION INTRACRANIAL INC DIAG ANGIO  07/25/2022   LOOP RECORDER INSERTION N/A 07/28/2022   Procedure: LOOP RECORDER INSERTION;  Surgeon: Regan Lemming, MD;  Location: [facility] INVASIVE CV LAB;  Service: Cardiovascular;  Laterality: N/A;   RADIOLOGY WITH ANESTHESIA N/A 07/25/2022   Procedure: RADIOLOGY WITH ANESTHESIA;  Surgeon: Radiologist, Medication, MD;  Location: [facility] OR;  Service: Radiology;  Laterality: N/A;   TEE WITHOUT CARDIOVERSION N/A 07/28/2022   Procedure: TRANSESOPHAGEAL ECHOCARDIOGRAM;  Surgeon: Parke Poisson, MD;  Location: [facility] INVASIVE CV LAB;  Service: Cardiovascular;  Laterality: N/A;   Patient Active Problem List   Diagnosis Date Noted   Loud snoring 10/18/2022   Adjustment disorder with mixed anxiety and depressed mood 08/07/2022   Cerebrovascular accident (CVA) due to occlusion of left posterior communicating artery (HCC) 08/01/2022   Acute ischemic left ACA stroke (HCC) 07/25/2022   Arterial ischemic stroke, ACA (anterior cerebral artery), left, acute (HCC) 07/25/2022    DYSLIPIDEMIA 04/24/2007   Renal failure 11/29/2006   Essential hypertension 11/12/2006    ONSET DATE: 08/24/2022 (referral date)  REFERRING DIAG: I63.9 (ICD-10-CM) - CVA (cerebral vascular accident) (HCC)  THERAPY DIAG:  Muscle weakness (generalized)  Other lack of coordination  Unsteadiness on feet  Other abnormalities of gait and mobility  Rationale for Evaluation and Treatment: Rehabilitation  SUBJECTIVE:  SUBJECTIVE STATEMENT: Pt reports no acute changes since last visit. No complaints of pain today. Pt continues to ambulate in/out of his appointment with RW.   PERTINENT HISTORY:  hypertension, CKD stage IV followed by outpatient nephrology  PAIN:  Are you having pain? No  PRECAUTIONS: Fall and ICD/Pacemaker  WEIGHT BEARING RESTRICTIONS: No  FALLS: Has patient fallen in last 6 months? Yes. Number of falls 1, fell last weekend at home trying to do something by himself, he bumped his head, his ex-wife helped him to get back up after the fall  LIVING ENVIRONMENT: Lives with:  lives with ex-wife Lives in: House/apartment Stairs: No Has following equipment at home: Environmental consultant - 2 wheeled, Wheelchair (manual), shower chair, and bed side commode  PLOF: Needs assistance with ADLs, Needs assistance with homemaking, Needs assistance with gait, and Needs assistance with transfers  PATIENT GOALS: "to walk"  OBJECTIVE:   DIAGNOSTIC FINDINGS:  Brain MRI 07/26/22 IMPRESSION: 1. Confluent Left ACA territory infarct. Cytotoxic edema with no hemorrhagic transformation.   2. Multiple additional scattered mostly punctate ischemic foci in the left greater than right MCA, PCA, and occasionally cerebellar artery territories. No significant intracranial mass effect.   3. 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   4. See intracranial MRA is reported separately today.    COGNITION: Overall cognitive status: Within functional limits for tasks assessed   SENSATION: N/T in R hemibody, impaired sensation though light touch appears intact  EDEMA:  Swelling in RLE/R foot  POSTURE: rounded shoulders, forward head, and posterior pelvic tilt   LOWER EXTREMITY MMT:    MMT Right Eval Left Eval  Hip flexion 1 5  Hip extension    Hip abduction    Hip adduction    Hip internal rotation    Hip external rotation    Knee flexion 1 5  Knee extension 1 5  Ankle dorsiflexion 0 5  Ankle plantarflexion    Ankle inversion    Ankle eversion    (Blank rows = not tested)     TODAY'S TREATMENT:          VITALS Vitals:   11/16/22 0936  BP: (!) 138/90  Pulse: 65   BP assessed in sitting after ambulating into gym from Speech Therapy appointment.             Gait Training  Gait pattern:  vaulting LLE but decreased from previous sessions, ER of RLE, decreased hip/knee flexion- Right, and decreased ankle dorsiflexion- Right Distance walked:  various clinic distances, from parking lot to clinic lobby and in/out of therapy appointment Assistive device utilized: Walker - 2 wheeled and R AFO Level of assistance: SBA Comments: decreased vaulting noted with LLE, improved ability to perform functional mobility with ambulation in/out of appt from the car without use of w/c   TherAct Alt L/R 4" step taps with L countertop support 3 x 10 reps B to fatigue Pt requires manual assist to lift RLE on/off step to avoid vaulting with LLE Pt fatigues very quickly with this activity  TherEx Standing heel lifts 3 x 10 reps with manual assist to encourage equal WB through BLE Added to HEP, see bolded below  NMR Standing standing balance with 2" step with progression to  4" step and  6" step under LLE to encourage increased WB through RLE while  performing bean bag toss. Pt only requires intermittent LUE support on countertop when performing task with 6" step. Pt also has onset  of of RLE shaking with onset of fatigue, requires seated rest break to recover.   PATIENT EDUCATION: Education details: continue HEP and added to HEP, PT POC with plan to d/c at end of this POC due to limited visits with insurance and pt with difficulty with transportation  Person educated: Patient Education method: Explanation Education comprehension: verbalized understanding and needs further education  HOME EXERCISE PROGRAM: Access Code: [email or web address] URL: [email or web address] Date: 09/27/2022 Prepared by: Peter Congo  Exercises - Hooklying Heel Slide  - 1 x daily - 7 x weekly - 2 sets - 10-15 reps - Supine Bridge  - 1 x daily - 7 x weekly - 2-3 sets - 15 reps - Clamshell  - 1 x daily - 7 x weekly - 2 sets - 10 reps - Sidelying Reverse Clamshell  - 1 x daily - 7 x weekly - 2 sets - 10 reps - Long Sitting Hamstring Stretch  - 1 x daily - 7 x weekly - 1 sets - 3-5 reps - 30 sec hold - Staggered Sit-to-Stand  - 1 x daily - 7 x weekly - 3 sets - 5 reps  - Side-stepping at [place]  - Heel Raises with Counter Support  - 1 x daily - 7 x weekly - 3 sets - 10 reps   GOALS: Goals reviewed with patient? Yes  SHORT TERM GOALS: Target date: 10/23/2022  Pt will be independent with initial outpatient HEP for improved strength, balance, transfers and gait. Baseline: Goal status: MET  2.  Pt will improve 5 x STS to less than or equal to 30 seconds to demonstrate improved functional strength and transfer efficiency.  Baseline: 35.69 sec (7/1), 24.25 sec (7/29) Goal status: MET  3.  Pt will improve normal TUG to less than or equal to 55 seconds for improved functional mobility and decreased fall risk. Baseline: 67 sec with RW (7/1), 39.5 sec with RW (7/29) Goal status: MET  4.  Pt will improve gait velocity to at least .90 ft/sec for improved  gait efficiency and performance at CGA level  Baseline: 0.67 ft/sec with RW and CGA (7/1), 0.92 ft/sec with RW and CGA Goal status: MET  5.  Pt will ambulate x 150 ft with LRAD and CGA to demonstrate improved endurance and increased independence with functional mobility. Baseline: 32.8 ft with RW and CGA, 115 ft with RW and CGA (7/29) Goal status: IN PROGRESS  6.  Pt will initiate stair training as safe and able. Baseline: 8 stairs with 1-2 handrails and min A (7/29) Goal status: MET  LONG TERM GOALS: Target date: 11/23/2022   Pt will be independent with final HEP for improved strength, balance, transfers and gait. Baseline:  Goal status: INITIAL  2.  Pt will improve 5 x STS to less than or equal to 20 seconds to demonstrate improved functional strength and transfer efficiency.  Baseline: 35.69 sec (7/1), 24.25 sec (7/29) Goal status: REVISED/UPGRADED  3.  Pt will improve normal TUG to less than or equal to 35 seconds for improved functional mobility and decreased fall risk. Baseline: 67 sec with RW (7/1), 39.5 sec with RW (7/29) Goal status: REVISED/UPGRADED  4.  Pt will improve gait velocity to at least 1.25 ft/sec for improved gait efficiency and performance at SBA level  Baseline: 0.67 ft/sec with RW and CGA (7/1), 0.92 ft/sec with RW and CGA Goal status: INITIAL  5.  Pt will ambulate x 200 ft with LRAD and SBA to demonstrate improved endurance and increased  independence with functional mobility. Baseline: 32.8 ft with RW and CGA, 115 ft with RW and CGA (7/29); 345' w/RW and SBA Goal status: MET  6.  Pt will ascend/descend 12 stairs with LRAD and min A to demonstrate increased independence with functional mobility. Baseline: not assessed at eval, 8 stairs with 1-2 handrails and min A (7/29) Goal status: INITIAL  ASSESSMENT:  CLINICAL IMPRESSION: Emphasis of skilled PT session on continuing to work on RLE NMR with standing tasks this session as well as adding to HEP for  RLE strengthening. Pt's RLE does fatigue quickly and he requires several seated rest breaks to recover. Pt continues to benefit from skilled therapy services to work towards LTGs. Continue POC.     OBJECTIVE IMPAIRMENTS: Abnormal gait, cardiopulmonary status limiting activity, decreased activity tolerance, decreased balance, decreased coordination, decreased endurance, decreased mobility, difficulty walking, decreased strength, increased edema, impaired sensation, and impaired UE functional use.   ACTIVITY LIMITATIONS: carrying, lifting, bending, standing, squatting, stairs, transfers, bed mobility, bathing, toileting, dressing, reach over head, and hygiene/grooming  PARTICIPATION LIMITATIONS: meal prep, cleaning, laundry, interpersonal relationship, driving, shopping, community activity, and occupation  PERSONAL FACTORS: 1-2 comorbidities:    hypertension, CKD stage IV followed outpatient nephrology are also affecting patient's functional outcome.   REHAB POTENTIAL: Good  CLINICAL DECISION MAKING: Stable/uncomplicated  EVALUATION COMPLEXITY: Moderate  PLAN:  PT FREQUENCY: 2x/week  PT DURATION: 8 weeks  PLANNED INTERVENTIONS: Therapeutic exercises, Therapeutic activity, Neuromuscular re-education, Balance training, Gait training, Patient/Family education, Self Care, Joint mobilization, Stair training, Vestibular training, Canalith repositioning, Visual/preceptual remediation/compensation, Orthotic/Fit training, DME instructions, Aquatic Therapy, Dry Needling, Wheelchair mobility training, Cryotherapy, Moist heat, Taping, Manual therapy, and Re-evaluation  PLAN FOR NEXT SESSION:  check BP, work on RLE NMR and strengthening, gait with RW--needs to work on decreasing vaulting with his LLE when stepping with RLE (could do forward/backward steps at mat or in // bars, sit to stands with step under LLE, work on R HS strengthening, add lift to L side? Standing next to wall to block L hip); standing  reaching task? RLE step-overs, has defibrillator so no e-stim  Plan to d/c at end of this POC due to limited visits with insurance and lack of transportation; add to HEP (RLE SLS or stance with LLE on small step performing standing task, small step taps?), gait outdoors    Peter Congo, PT, DPT, CSRS  11/16/22, 10:18 AM

## 2022-11-16 NOTE — Therapy (Signed)
OUTPATIENT SPEECH LANGUAGE PATHOLOGY TREATMENT NOTE   Patient Name: Joseph Bright MRN: [id number] DOB:1975-07-06, 47 y.o., male 65 Date: 11/16/2022  PCP: Noberto Retort, MD REFERRING PROVIDER: Charlton Amor, PA-C  END OF SESSION:   End of Session - 11/16/22 0838     Visit Number 13    Number of Visits 25    Date for SLP Re-Evaluation 12/18/22    Authorization Type cign[employer] medicaid - 60 combined - each discipline counts as 1 visit -  physical is his primary concern    SLP Start Time 0845    SLP Stop Time  0930    SLP Time Calculation (min) 45 min    Activity Tolerance Patient tolerated treatment well               Past Medical History:  Diagnosis Date   Hypertension    Stroke [facility])    Past Surgical History:  Procedure Laterality Date   IR CT HEAD LTD  08/07/2022   IR INTRA CRAN STENT  08/07/2022   IR PERCUTANEOUS ART THROMBECTOMY/INFUSION INTRACRANIAL INC DIAG ANGIO  07/25/2022   LOOP RECORDER INSERTION N/A 07/28/2022   Procedure: LOOP RECORDER INSERTION;  Surgeon: Regan Lemming, MD;  Location: [facility] INVASIVE CV LAB;  Service: Cardiovascular;  Laterality: N/A;   RADIOLOGY WITH ANESTHESIA N/A 07/25/2022   Procedure: RADIOLOGY WITH ANESTHESIA;  Surgeon: Radiologist, Medication, MD;  Location: [facility] OR;  Service: Radiology;  Laterality: N/A;   TEE WITHOUT CARDIOVERSION N/A 07/28/2022   Procedure: TRANSESOPHAGEAL ECHOCARDIOGRAM;  Surgeon: Parke Poisson, MD;  Location: [facility] INVASIVE CV LAB;  Service: Cardiovascular;  Laterality: N/A;   Patient Active Problem List   Diagnosis Date Noted   Loud snoring 10/18/2022   Adjustment disorder with mixed anxiety and depressed mood 08/07/2022   Cerebrovascular accident (CVA) due to occlusion of left posterior communicating artery (HCC) 08/01/2022   Acute ischemic left ACA stroke (HCC) 07/25/2022   Arterial ischemic stroke, ACA (anterior cerebral artery), left, acute (HCC) 07/25/2022   DYSLIPIDEMIA 04/24/2007   Renal  failure 11/29/2006   Essential hypertension 11/12/2006    ONSET DATE: 07/25/22  REFERRING DIAG: I61.9 (ICD-10-CM) - CVA (cerebrovascular accident due to intracerebral hemorrhage) (HCC)  THERAPY DIAG:  Aphasia  Rationale for Evaluation and Treatment: Rehabilitation  SUBJECTIVE:   SUBJECTIVE STATEMENT: "It's going good" re: speech/language.   PAIN: Are you having pain? No  OBJECTIVE:   TODAY'S TREATMENT:                                                                                                                                         DATE:   11/16/22: Addressed use of anomia compensations and discourse strategies using constrained summarization. SLP presented pt with short video, provided instructions for summarization and use of strategies via visual aid. Pt made aware of goal to reduce fillers. Pt observed to use 15+ fillers over course of  10 sentences, challenges in summarization with pt with usual deviations in topic mx. Pt did have awareness, attempts to return to topic with mod-I. Pt reporting challenges with communication with loved ones because of his aphasia. Further questioning reveals that pt may be having miscommunications d/t lack of eloquence/theory of mind. Generated strategies to repair miscommunications PRN.   11/13/22: [name] did not re-send email over the weekend, he required extended time to attempt to pull up his sent email, we decided to complete this for home work. Targeted detailed conversation re: [employer] - with halting and some fillers, [name] described the elimination rules and how each team progresses with 2 episodes of word finding where [name] stated "I'm looking for the word" He used compensations as needed. 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   11/09/22: Pt completed HEP,  sent to emails to treating SLPs. Demonstration of wheelchair parts sheet and sample email for tech scheduling. Error ID on latter email. SLP A pt in generating strategy of creating checklist to ensure completeness of of x2 types of emails. Pt generates list of x5 work tasks previously performed.  Given list of wheel chair parts, pt able to read name and then describe x4 parts. Response to questions from SLP re: named parts with pt successful Usual fillers and anomia evidenced throughout conversation, with occasional cues to use description when anomia occurs. Some vagueness exhibited, requiring cues from SLP to elicit more concrete language to relay thoughts/ideas to listener unfamiliar with topic.   11/06/22: [name] reports consistent use of verbal compensations and self advocacy with family and friends. He relays a situation where he used verbal compensation successfully. His primary concern is communication at work. Added goals re: this. Role played phone calls for wheel chair service, scheduling service and describing the most common service issues with power chairs with occasional min A and extended time Naming technical parts of manual wheelchair with extended time, supervision cues. Fillers reduced but remain. [name] is not bothered by fillers as they have decreased. [name] will send his SLP's an email re: setting up a service call with a client.   8/5/24[name] reports some depression and loneliness since being out of work. Targeted compensations for aphasia and fillers/mazing in conversation  using slow rate, descriptions, self advocating. Slow rate and self advocating with supervision cues. Initially fillers 2-3x over 2 minutes, increased as complexity of conversation increased, however 2-3 per utterance is improving. We generated strategies to assist with grief/depression including reaching out to friends, exercise, getting out doors. Resources of stroke support group, aphasia group, social  media sights provided. See patient instructions   10/26/22: Addressed word finding with divergent naming task, with pt demonstrating ability to generate x23 words IND, additional x13 with mod semantic cues. Led pt through structured conversation to address use of word finding strategies and discourse cohesion strategies at conversational level. Cues given for stopping and thinking prior to responding to aid in cohesiveness. Occasional cues needed for use of anomia strategies. Pt with frequent fillers in conversation, mazing and false starts. Reduces when pt allows self time to formulate response prior to speaking.   10/23/22: Pt more alert during today's session, and is now coming to sessions alone because spouse has gone back to work. Pt able to name 10 words within a category during warm  up activity. SLP targeted word finding with scattergories. Patient able to generate words in a given category with 40% accuracy. Required frequent mod-A of semantic and phonemic cues as well as immediate feedback to generate more responses. Patient states that word finding is going better at home, and that he is implementing the practice of semantic feature analysis, which has helped.     PATIENT EDUCATION: Education details: See Today's Treatment; aphasia ed, compensatory strategies, see patient instructions Person educated: Patient Education method: Explanation, Demonstration, Verbal cues, and Handouts Education comprehension: verbal cues required and needs further education     GOALS: Goals reviewed with patient? Yes   SHORT TERM GOALS: Target date: 10/23/22   Pt will name 15 items in personally relevant category with rare min A Baseline: 9 items Goal status: MET   2.  Pt will use verbal compensations 4/5 word finding episodes with rare min A Baseline: 1/5 Goal status: MET   3.  Pt will generate complex sentences in structured tasks 4/5x with occasional min A Baseline: no complex sentences Goal status:  MET   4.  Pt will carry over 3 strategies to compensate for auditory comprehension Baseline: No strategies Goal status: MET   LONG TERM GOALS: Target date: 12/18/22   Pt will use verbal compensations for aphasia 5/5x in conversation with rare min A Baseline:  Goal status: MET   2.  Pt will generate complex sentences in simple conversation 4/5 turns with occasional min A Baseline: simple sentences only Goal status: MET   3.  Pt will use compensatory strategies to gain a turn in group or fast moving conversation 3x outside of therapy Baseline: no strategy for this Goal status: MET   4.  Pt will reduce fillers of "um" and "ah" to 5 or less over 5 sentences Baseline: 15+ fillers over 3 sentence narrative Goal status: ONGOING  5. Pt will role play communication interactions in work related scenarios using compensations for aphasia as needed                     Goal Status: New - added 11/06/22  6. Pt will generate 4 work related emails with no errors                Goal Status: New added 11/06/22   ASSESSMENT:   CLINICAL IMPRESSION: Patient is a 47 y.o. male who was seen today for moderate aphasia characterized by halting, fillers due to anomia. Nasair continues to improve with word finding in conversation as well as structured naming tasks. He is using verbal compensations for aphasia successfully and reports these strategies are also improving his word finding. Aphasia improved to mild to moderate. Auditory comprehension continues to improve for moderately complex conversation, however he continues to benefit from family being present to manage medical, legal and financial information/conversations. I recommend skilled ST to maximize communication for safety, independence, and return to PLOF.    OBJECTIVE IMPAIRMENTS: include aphasia. These impairments are limiting patient from return to work, ADLs/IADLs, and effectively communicating at home and in community. Factors affecting  potential to achieve goals and functional outcome are financial resources. Patient will benefit from skilled SLP services to address above impairments and improve overall function.   REHAB POTENTIAL: Good   PLAN:   SLP FREQUENCY: 2x/week   SLP DURATION: 12 weeks   PLANNED INTERVENTIONS: Language facilitation, Environmental controls, Cueing hierachy, Cognitive reorganization, Internal/external aids, Functional tasks, Multimodal communication approach, and SLP instruction and feedback  Maia Breslow, CCC-SLP 11/16/2022, 8:39 AM

## 2022-11-16 NOTE — Therapy (Signed)
OUTPATIENT OCCUPATIONAL THERAPY NEURO TREATMENT  Patient Name: Joseph Bright MRN: [id number] DOB:[birth year], 47 y.o., male 24 Date: 11/16/2022  PCP: Noberto Retort, MD  REFERRING PROVIDER: Charlton Amor, PA-C   END OF SESSION:  OT End of Session - 11/16/22 1011     Visit Number 13    Number of Visits 17    Date for OT Re-Evaluation 11/20/22    Authorization Type [employer] (60 VL - OT, PT, ST)    OT Start Time 1015    OT Stop Time 1055    OT Time Calculation (min) 40 min    Activity Tolerance Patient tolerated treatment well    Behavior During Therapy [facility] for tasks assessed/performed             Past Medical History:  Diagnosis Date   Hypertension    Stroke [facility])    Past Surgical History:  Procedure Laterality Date   IR CT HEAD LTD  08/07/2022   IR INTRA CRAN STENT  08/07/2022   IR PERCUTANEOUS ART THROMBECTOMY/INFUSION INTRACRANIAL INC DIAG ANGIO  07/25/2022   LOOP RECORDER INSERTION N/A 07/28/2022   Procedure: LOOP RECORDER INSERTION;  Surgeon: Regan Lemming, MD;  Location: [facility] INVASIVE CV LAB;  Service: Cardiovascular;  Laterality: N/A;   RADIOLOGY WITH ANESTHESIA N/A 07/25/2022   Procedure: RADIOLOGY WITH ANESTHESIA;  Surgeon: Radiologist, Medication, MD;  Location: [facility] OR;  Service: Radiology;  Laterality: N/A;   TEE WITHOUT CARDIOVERSION N/A 07/28/2022   Procedure: TRANSESOPHAGEAL ECHOCARDIOGRAM;  Surgeon: Parke Poisson, MD;  Location: [facility] INVASIVE CV LAB;  Service: Cardiovascular;  Laterality: N/A;   Patient Active Problem List   Diagnosis Date Noted   Loud snoring 10/18/2022   Adjustment disorder with mixed anxiety and depressed mood 08/07/2022   Cerebrovascular accident (CVA) due to occlusion of left posterior communicating artery (HCC) 08/01/2022   Acute ischemic left ACA stroke (HCC) 07/25/2022   Arterial ischemic stroke, ACA (anterior cerebral artery), left, acute (HCC) 07/25/2022   DYSLIPIDEMIA 04/24/2007   Renal failure 11/29/2006    Essential hypertension 11/12/2006    ONSET DATE: 07/25/2022  REFERRING DIAG: I61.9 (ICD-10-CM) - CVA (cerebrovascular accident due to intracerebral hemorrhage)   THERAPY DIAG:  Muscle weakness (generalized)  Other lack of coordination  Other symptoms and signs involving the nervous system  Other disturbances of skin sensation  Arterial ischemic stroke, ACA (anterior cerebral artery), left, acute (HCC)  Rationale for Evaluation and Treatment: Rehabilitation  SUBJECTIVE:   SUBJECTIVE STATEMENT: Pt reports he has been washing dishes at home.   Pt accompanied by: Self  PERTINENT HISTORY: Pt presented to ED 07/25/2022. "Cranial CT scan negative for acute changes. There was chronic appearing right thalamic lacunar infarct. 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 Status post TNK with interventional radiology for stenting. MRI follow-up showed confluent left ACA territory infarction. Cytotoxic edema with no hemorrhagic transformation. Multiple additional scattered mostly punctate ischemic foci in the left greater than right MCA, PCA and occasionally cerebellar artery territories."  Pt in IRF from 08/01/2022 -08/31/2022 then discharged to OP OT, PT, and ST.   PRECAUTIONS: Fall  WEIGHT BEARING RESTRICTIONS: No  PAIN:  Are you having pain? No  FALLS: Has patient fallen in last 6 months? No  LIVING ENVIRONMENT: Lives with: lives with their family Lives in: House/apartment Stairs: No Has following equipment at home: Dan Humphreys - 2 wheeled, Environmental consultant - 4 wheeled, Wheelchair (manual), bed side commode, and Grab bars  PLOF: Independent; full-time working  for [employer]; driving; sports (football and basketball)  PATIENT GOALS: return to PLOF  OBJECTIVE:   HAND DOMINANCE: Right  ADLs: Overall ADLs: min A  IADLs: Shopping: dependent Light housekeeping: dependent Community mobility: dependent  Handwriting: Not legible  MOBILITY STATUS: Needs  Assist: manual wheelchair  ACTIVITY TOLERANCE: Activity tolerance: fair  FUNCTIONAL OUTCOME MEASURES: FOTO: Slight: 59 10/23/2022: 79   UPPER EXTREMITY ROM:     AROM Right (eval) Left (eval)  Shoulder flexion WNL WNL  Shoulder abduction WNL WNL  Elbow flexion WNL WNL  Elbow extension WNL WNL  Wrist flexion WNL WNL  Wrist extension WNL WNL  Wrist pronation WNL WNL  Wrist supination WNL WNL   Digit Composite Flexion WNL WNL  Digit Composite Extension WNL WNL  Digit Opposition WNL WNL  (Blank rows = not tested)  RUE: IR to hip; overall bradykinesia with tremor  UPPER EXTREMITY MMT:     MMT Right (eval) Left (eval)  Shoulder flexion 4/5 WNL  Shoulder abduction 4/5 WNL  Elbow flexion 4/5 WNL  Elbow extension 4/5 WNL  (Blank rows = not tested)  HAND FUNCTION: Grip strength: Right: 38.8 lbs; Left: 82.8 lbs 10/23/2022: 57.5 lbs  COORDINATION: 9 Hole Peg test: Right: 139 sec; Left: 32 sec 10/23/2022: 59 seconds  SENSATION: Paresthesias reported in R fingertips  EDEMA: none reported or observed  MUSCLE TONE: WFL  COGNITION: Overall cognitive status: Within functional limits for tasks assessed  VISION: Subjective report: no changes Baseline vision: Wears glasses for reading only Visual history:  none  VISION ASSESSMENT: WFL  PERCEPTION: WFL  PRAXIS: WFL  OBSERVATIONS: Pt appears well-kept. He is able to propel w/c with increased time. Has RW with him.   TODAY'S TREATMENT:     - Therapeutic exercises completed for duration as noted below including:       OT initiated pen tricks as noted in pt instructions for improved RUE fine motor coordination.     For improved wrist ROM, wrist strength, and fine motor coordination of affected extremity, patient completed affected RUE  Wrist circles rolling clockwise 1 min and then counterclockwise 1 min for ROM   With 2 pound dumbbell, patient completed radial and ulnar deviations x 10 with cues to keep full  grasp on weight at all times for ROM and strengthening  With 2 lb dumbbell, pt completed forearm supination and pronation. X 10.  With 2 pound dumbbell, patient completed wrist flexion and extension x10 for ROM and strengthening  With 2 pound dumbbell, patient opened affected hand slowly to allow weight to go toward the tips of digits and then closed digits around the weight to return to a full grasp X10 for ROM and strengthening  Patient completed translation with a plastic checker positioned at the base of affected hand and sliding it between the 1st and 2nd digits using the tips of digits for progression.   Patient walked all 5 digits to edge of table and back x10 and then first 3 digits to edge of table and back x10 for ROM and coordination  Patient completed full composite flexion and extension of digits to progress to edge of table and back x10 for ROM and coordination       Patient completed full composite flexion and extension of digits to progress to edge of table and back x10 for ROM and coordination  Patient completed pray and pulls by putting palms of hands together in prayer position and pushing them together approximately 5 seconds, then hooking digits  together with elbows bent and pulling hands apart without loosening grasp for approximately 5 seconds x 10 each for ROM and strengthening  Patient placed lid in palm of affected hand and turned it in a circle clockwise and then counterclockwise x1 each for coordination, ROM, and strengthening   PATIENT EDUCATION: Education details: see above Person educated: Patient Education method: Explanation, Demonstration, Verbal cues, and Handouts Education comprehension: verbalized understanding, returned demonstration, and verbal cues required  HOME EXERCISE PROGRAM: 09/12/2022: Chilton Si putty and coordination HEPs 09/27/22: Additional coordination HEP  10/04/22: shoulder girdle/scapula strengthening HEP  10/23/2022: Shoulder slides and  alphabet  GOALS:  SHORT TERM GOALS: Target date: 10/10/2022    Patient will demonstrate independence with initial RUE HEP. Baseline: Goal status: MET  2.  Patient will demonstrate at least 45 lbs R grip strength as needed to open jars and other containers. Baseline: 38.8 lbs 10/23/2022: 57.5 lbs Goal status: MET  3.  Pt will report no difficulty holding a dish while washing it.  Baseline: with some difficulty 10/23/2022: has not tried recently; simulated in clinic: little difficulty Goal status: IN PROGRESS   LONG TERM GOALS: Target date: 11/17/2022    Patient will demonstrate updated RUE HEP with 25% verbal cues or less for proper execution. Baseline:  Goal status: IN PROGRESS  2.  Patient will demonstrate at least 50 lbs R grip strength as needed to open jars and other containers. Baseline: 38.8 lbs 10/23/2022: 57.5 lbs Goal status: MET  3.  Patient will complete nine-hole peg with use of R in 45 seconds or less. Baseline: 139 seconds 10/23/2022: 59 seconds; GOAL UPDATED (< or = 60 seconds originally) Goal status: REVISED  4.  Pt will complete d/c FOTO.  Baseline: 59 10/23/2022: 79 Goal status: INITIAL   ASSESSMENT:  CLINICAL IMPRESSION: Pt demonstrates good understanding of RUE coordination HEP. Will continue to work on RUE strengthening and coordination in efforts to return to PLOF.   PERFORMANCE DEFICITS: in functional skills including ADLs, IADLs, coordination, dexterity, strength, Fine motor control, Gross motor control, mobility, endurance, and UE functional use  IMPAIRMENTS: are limiting patient from ADLs, IADLs, work, and leisure.   CO-MORBIDITIES: may have co-morbidities  that affects occupational performance. Patient will benefit from skilled OT to address above impairments and improve overall function.  REHAB POTENTIAL: Good  PLAN:  OT FREQUENCY: 2x/week  OT DURATION: 8 weeks  PLANNED INTERVENTIONS: self care/ADL training, therapeutic exercise,  therapeutic activity, neuromuscular re-education, manual therapy, functional mobility training, splinting, ultrasound, fluidotherapy, patient/family education, DME and/or AE instructions, and Re-evaluation  RECOMMENDED OTHER SERVICES: Receiving PT and ST  CONSULTED AND AGREED WITH PLAN OF CARE: Patient and family member/caregiver  PLAN FOR NEXT SESSION: More weight bearing at mat table, continue coordination and forced use of RUE; Review writing exercises   Delana Meyer, OT 11/16/2022, 12:50 PM

## 2022-11-16 NOTE — Patient Instructions (Addendum)
"  Pen Tricks" Bigger object = easier  Rotation- Twirl pen like a baton 10-15 x one direction, then switch directions   Shift- Hold pen like a dart at the tip and shift the pen ("inch worm") until holding at the base of the pen. Shift back to the tip of the pen and repeat 10-15x   Flip- With hand down on the table, hold pen in writing position, then flip it to an erase position. Flip it back to writing position and repeat 10-15x   Translation- With your palm up to the sky, put an object in your palm. Without tipping your hand, use your fingers and thumb to move the object to the tips of each of your fingers, one-at-a-time.  Repeat 5-10x each finger. Wrist Circles With your hand out in front of you, roll wrist clockwise 1 min and then counterclockwise 1 min  Weighted Wrist  Hold the weight (or water bottle) fully in your hand, with your thumb up, bring the weight up and down x 10 (like you are fishing) Now, turning your palm down, bring your hand up and down x10  Weighted Roll Hold the weight (or water bottle) fully in your hand with palm up, open hand slowly, allowing the weight to roll toward the tips of your fingers, and then close your fingers around it to make a fist X10  Translation Place a coin at the base of your hand and pull it to the tips of your fingers using your thumb and fingers x 10   Spider Crawls Crawl all fingers to edge of table and back x5 Crawl thumb, middle, and index fingers to edge of table and back x5  Finger Pushes Open and close all fingers and thumb to edge of table and back x5 Open and close thumb, middle, and index fingers to edge of table and back x5  Pray and Pulls Put palms of hands together and push elbows out keeping hands together Turn hands opposite one another, grasp, and then try to pull them apart Repeat whole process x10   Lid Circle Hold a lid in your hand palm up with your fingers on the outside, turn lid in your hand in a full circle  clockwise and counter clockwise x5

## 2022-11-20 ENCOUNTER — Encounter: Payer: Self-pay | Admitting: Speech Pathology

## 2022-11-20 ENCOUNTER — Ambulatory Visit: Payer: Managed Care, Other (non HMO) | Admitting: Speech Pathology

## 2022-11-20 ENCOUNTER — Ambulatory Visit: Payer: Managed Care, Other (non HMO) | Admitting: Physical Therapy

## 2022-11-20 ENCOUNTER — Ambulatory Visit: Payer: Managed Care, Other (non HMO) | Admitting: Occupational Therapy

## 2022-11-20 ENCOUNTER — Encounter: Payer: Self-pay | Admitting: Occupational Therapy

## 2022-11-20 DIAGNOSIS — M6281 Muscle weakness (generalized): Secondary | ICD-10-CM

## 2022-11-20 DIAGNOSIS — R4701 Aphasia: Secondary | ICD-10-CM

## 2022-11-20 DIAGNOSIS — R2689 Other abnormalities of gait and mobility: Secondary | ICD-10-CM

## 2022-11-20 DIAGNOSIS — R2681 Unsteadiness on feet: Secondary | ICD-10-CM

## 2022-11-20 DIAGNOSIS — R278 Other lack of coordination: Secondary | ICD-10-CM

## 2022-11-20 NOTE — Therapy (Signed)
OUTPATIENT PHYSICAL THERAPY NEURO TREATMENT  Patient Name: Joseph Bright MRN: [id number] DOB:30-Oct-1975, 47 y.o., male Today's Date: 11/20/2022   PCP: Noberto Retort, MD REFERRING PROVIDER: Charlton Amor, PA-C    END OF SESSION:  PT End of Session - 11/20/22 0938     Visit Number 14    Number of Visits 17    Date for PT Re-Evaluation 12/18/22    Authorization Type [employer]    PT Start Time 0935   this therapist running late   PT Stop Time 1015    PT Time Calculation (min) 40 min    Equipment Utilized During Treatment Gait belt    Activity Tolerance Patient tolerated treatment well    Behavior During Therapy [facility] for tasks assessed/performed                         Past Medical History:  Diagnosis Date   Hypertension    Stroke [facility])    Past Surgical History:  Procedure Laterality Date   IR CT HEAD LTD  08/07/2022   IR INTRA CRAN STENT  08/07/2022   IR PERCUTANEOUS ART THROMBECTOMY/INFUSION INTRACRANIAL INC DIAG ANGIO  07/25/2022   LOOP RECORDER INSERTION N/A 07/28/2022   Procedure: LOOP RECORDER INSERTION;  Surgeon: Regan Lemming, MD;  Location: [facility] INVASIVE CV LAB;  Service: Cardiovascular;  Laterality: N/A;   RADIOLOGY WITH ANESTHESIA N/A 07/25/2022   Procedure: RADIOLOGY WITH ANESTHESIA;  Surgeon: Radiologist, Medication, MD;  Location: [facility] OR;  Service: Radiology;  Laterality: N/A;   TEE WITHOUT CARDIOVERSION N/A 07/28/2022   Procedure: TRANSESOPHAGEAL ECHOCARDIOGRAM;  Surgeon: Parke Poisson, MD;  Location: [facility] INVASIVE CV LAB;  Service: Cardiovascular;  Laterality: N/A;   Patient Active Problem List   Diagnosis Date Noted   Loud snoring 10/18/2022   Adjustment disorder with mixed anxiety and depressed mood 08/07/2022   Cerebrovascular accident (CVA) due to occlusion of left posterior communicating artery (HCC) 08/01/2022   Acute ischemic left ACA stroke (HCC) 07/25/2022   Arterial ischemic stroke, ACA (anterior cerebral artery), left, acute  (HCC) 07/25/2022   DYSLIPIDEMIA 04/24/2007   Renal failure 11/29/2006   Essential hypertension 11/12/2006    ONSET DATE: 08/24/2022 (referral date)  REFERRING DIAG: I63.9 (ICD-10-CM) - CVA (cerebral vascular accident) (HCC)  THERAPY DIAG:  Muscle weakness (generalized)  Unsteadiness on feet  Other abnormalities of gait and mobility  Rationale for Evaluation and Treatment: Rehabilitation  SUBJECTIVE:  SUBJECTIVE STATEMENT: Pt reports no acute changes since last visit. Pt states that he walked to the trash in his apartment complex on Friday which was a longer distance that he has previously walked. Pt walked over uneven sidewalks, up/down inclines, and had to avoid obstacles, he was exhausted afterwards but happy that he pushed himself.   PERTINENT HISTORY:  hypertension, CKD stage IV followed by outpatient nephrology  PAIN:  Are you having pain? No  PRECAUTIONS: Fall and ICD/Pacemaker  WEIGHT BEARING RESTRICTIONS: No  FALLS: Has patient fallen in last 6 months? Yes. Number of falls 1, fell last weekend at home trying to do something by himself, he bumped his head, his ex-wife helped him to get back up after the fall  LIVING ENVIRONMENT: Lives with:  lives with ex-wife Lives in: House/apartment Stairs: No Has following equipment at home: Environmental consultant - 2 wheeled, Wheelchair (manual), shower chair, and bed side commode  PLOF: Needs assistance with ADLs, Needs assistance with homemaking, Needs assistance with gait, and Needs assistance with transfers  PATIENT GOALS: "to walk"  OBJECTIVE:   DIAGNOSTIC FINDINGS:  Brain MRI 07/26/22 IMPRESSION: 1. Confluent Left ACA territory infarct. Cytotoxic edema with no hemorrhagic transformation.   2. Multiple additional scattered mostly punctate  ischemic foci in the left greater than right MCA, PCA, and occasionally cerebellar artery territories. No significant intracranial mass effect.   3. 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   4. See intracranial MRA is reported separately today.    COGNITION: Overall cognitive status: Within functional limits for tasks assessed   SENSATION: N/T in R hemibody, impaired sensation though light touch appears intact  EDEMA:  Swelling in RLE/R foot  POSTURE: rounded shoulders, forward head, and posterior pelvic tilt   LOWER EXTREMITY MMT:    MMT Right Eval Left Eval  Hip flexion 1 5  Hip extension    Hip abduction    Hip adduction    Hip internal rotation    Hip external rotation    Knee flexion 1 5  Knee extension 1 5  Ankle dorsiflexion 0 5  Ankle plantarflexion    Ankle inversion    Ankle eversion    (Blank rows = not tested)     TODAY'S TREATMENT:                   Gait Training  RAMP:  Level of Assistance: CGA Assistive device utilized: Environmental consultant - 2 wheeled Ramp Comments: up/down ramp outdoors, good control of RW with descent  CURB:  Level of Assistance: Min A Assistive device utilized: Environmental consultant - 2 wheeled Curb Comments: ascending/descending one 6" curb x 2 reps each with RW and min A with cues for safe RW management  STAIRS:  Level of Assistance: Min A  Stair Negotiation Technique: Step to Pattern with Bilateral Rails  Number of Stairs: 12   Height of Stairs: 6  Comments: improved ability to advance RLE especially when descending, does fatigue by 3rd round  GAIT: Gait pattern:  vaulting with LLE, decreased hip/knee flexion- Right, decreased ankle dorsiflexion- Right, and poor foot clearance- Right Distance walked: various distances outdoors up/down inclines, across uneven sidewalk, and across uneven grass Assistive device utilized: Environmental consultant - 2 wheeled Level of assistance: Min A Comments: decreased RLE  clearance at times on grass, either drags limb or just scoots it along the grass, does exhibit occasionally a few good steps where he clears RLE   TherAct Attempt alt L/R step taps at  bottom of 6" steps with B handrails, pt unable to lift RLE up onto step. Regressed to performing 2" step-taps at countertop with 1-2 UE support. Added step taps to HEP (see bolded below) and encouraged patient to increase step height as he progresses.   PATIENT EDUCATION: Education details: continue HEP and added to HEP, PT POC with plan to d/c at end of this POC due to limited visits with insurance and pt with difficulty with transportation  Person educated: Patient Education method: Chief Technology Officer Education comprehension: verbalized understanding and needs further education  HOME EXERCISE PROGRAM: Access Code: [email or web address] URL: [email or web address] Date: 09/27/2022 Prepared by: Peter Congo  Exercises - Hooklying Heel Slide  - 1 x daily - 7 x weekly - 2 sets - 10-15 reps - Supine Bridge  - 1 x daily - 7 x weekly - 2-3 sets - 15 reps - Clamshell  - 1 x daily - 7 x weekly - 2 sets - 10 reps - Sidelying Reverse Clamshell  - 1 x daily - 7 x weekly - 2 sets - 10 reps - Long Sitting Hamstring Stretch  - 1 x daily - 7 x weekly - 1 sets - 3-5 reps - 30 sec hold - Staggered Sit-to-Stand  - 1 x daily - 7 x weekly - 3 sets - 5 reps  - Side-stepping at [place]  - Heel Raises with Counter Support  - 1 x daily - 7 x weekly - 3 sets - 10 reps - Alternating Step Taps with Counter Support  - 1 x daily - 7 x weekly - 3 sets - 10 reps   GOALS: Goals reviewed with patient? Yes  SHORT TERM GOALS: Target date: 10/23/2022  Pt will be independent with initial outpatient HEP for improved strength, balance, transfers and gait. Baseline: Goal status: MET  2.  Pt will improve 5 x STS to less than or equal to 30 seconds to demonstrate improved functional strength and transfer efficiency.  Baseline:  35.69 sec (7/1), 24.25 sec (7/29) Goal status: MET  3.  Pt will improve normal TUG to less than or equal to 55 seconds for improved functional mobility and decreased fall risk. Baseline: 67 sec with RW (7/1), 39.5 sec with RW (7/29) Goal status: MET  4.  Pt will improve gait velocity to at least .90 ft/sec for improved gait efficiency and performance at CGA level  Baseline: 0.67 ft/sec with RW and CGA (7/1), 0.92 ft/sec with RW and CGA Goal status: MET  5.  Pt will ambulate x 150 ft with LRAD and CGA to demonstrate improved endurance and increased independence with functional mobility. Baseline: 32.8 ft with RW and CGA, 115 ft with RW and CGA (7/29) Goal status: IN PROGRESS  6.  Pt will initiate stair training as safe and able. Baseline: 8 stairs with 1-2 handrails and min A (7/29) Goal status: MET  LONG TERM GOALS: Target date: 11/23/2022   Pt will be independent with final HEP for improved strength, balance, transfers and gait. Baseline:  Goal status: INITIAL  2.  Pt will improve 5 x STS to less than or equal to 20 seconds to demonstrate improved functional strength and transfer efficiency.  Baseline: 35.69 sec (7/1), 24.25 sec (7/29) Goal status: REVISED/UPGRADED  3.  Pt will improve normal TUG to less than or equal to 35 seconds for improved functional mobility and decreased fall risk. Baseline: 67 sec with RW (7/1), 39.5 sec with RW (7/29) Goal status: REVISED/UPGRADED  4.  Pt will  improve gait velocity to at least 1.25 ft/sec for improved gait efficiency and performance at SBA level  Baseline: 0.67 ft/sec with RW and CGA (7/1), 0.92 ft/sec with RW and CGA Goal status: INITIAL  5.  Pt will ambulate x 200 ft with LRAD and SBA to demonstrate improved endurance and increased independence with functional mobility. Baseline: 32.8 ft with RW and CGA, 115 ft with RW and CGA (7/29); 345' w/RW and SBA Goal status: MET  6.  Pt will ascend/descend 12 stairs with LRAD and min A to  demonstrate increased independence with functional mobility. Baseline: not assessed at eval, 8 stairs with 1-2 handrails and min A (7/29), 12 stairs with 2 handrails min A (8/26) Goal status: MET  ASSESSMENT:  CLINICAL IMPRESSION: Emphasis of skilled PT session on assessing stair navigation again, assessing gait outdoors with RW across various surfaces and inclines, and adding to HEP. Pt exhibits improved ability to navigate stairs safely with decreased assist needed for RLE advancement and control this date. Pt exhibits good ability to navigate over various unlevel surfaces outdoors with RW, does require min A for balance especially across grass as he exhibits decreased RLE clearance across grass. Plan to assess goals next session and d/c from OPPT. Continue POC.     OBJECTIVE IMPAIRMENTS: Abnormal gait, cardiopulmonary status limiting activity, decreased activity tolerance, decreased balance, decreased coordination, decreased endurance, decreased mobility, difficulty walking, decreased strength, increased edema, impaired sensation, and impaired UE functional use.   ACTIVITY LIMITATIONS: carrying, lifting, bending, standing, squatting, stairs, transfers, bed mobility, bathing, toileting, dressing, reach over head, and hygiene/grooming  PARTICIPATION LIMITATIONS: meal prep, cleaning, laundry, interpersonal relationship, driving, shopping, community activity, and occupation  PERSONAL FACTORS: 1-2 comorbidities:    hypertension, CKD stage IV followed outpatient nephrology are also affecting patient's functional outcome.   REHAB POTENTIAL: Good  CLINICAL DECISION MAKING: Stable/uncomplicated  EVALUATION COMPLEXITY: Moderate  PLAN:  PT FREQUENCY: 2x/week  PT DURATION: 8 weeks  PLANNED INTERVENTIONS: Therapeutic exercises, Therapeutic activity, Neuromuscular re-education, Balance training, Gait training, Patient/Family education, Self Care, Joint mobilization, Stair training, Vestibular  training, Canalith repositioning, Visual/preceptual remediation/compensation, Orthotic/Fit training, DME instructions, Aquatic Therapy, Dry Needling, Wheelchair mobility training, Cryotherapy, Moist heat, Taping, Manual therapy, and Re-evaluation  PLAN FOR NEXT SESSION:  assess LTG and d/c    Peter Congo, PT, DPT, CSRS  11/20/22, 10:17 AM

## 2022-11-20 NOTE — Therapy (Unsigned)
OUTPATIENT OCCUPATIONAL THERAPY NEURO TREATMENT/Renewal  Patient Name: Joseph Bright MRN: [id number] DOB:July 12, 1975, 47 y.o., male Today's Date: 11/21/2022  PCP: Noberto Retort, MD  REFERRING PROVIDER: Charlton Amor, PA-C   END OF SESSION:  OT End of Session - 11/20/22 1017     Visit Number 14    Number of Visits 22    Date for OT Re-Evaluation 01/26/23    Authorization Type [employer] (60 VL - OT, PT, ST)    OT Start Time 1015    OT Stop Time 1100    OT Time Calculation (min) 45 min    Activity Tolerance Patient tolerated treatment well    Behavior During Therapy WFL for tasks assessed/performed             Past Medical History:  Diagnosis Date   Hypertension    Stroke [facility])    Past Surgical History:  Procedure Laterality Date   IR CT HEAD LTD  08/07/2022   IR INTRA CRAN STENT  08/07/2022   IR PERCUTANEOUS ART THROMBECTOMY/INFUSION INTRACRANIAL INC DIAG ANGIO  07/25/2022   LOOP RECORDER INSERTION N/A 07/28/2022   Procedure: LOOP RECORDER INSERTION;  Surgeon: Regan Lemming, MD;  Location: [facility] INVASIVE CV LAB;  Service: Cardiovascular;  Laterality: N/A;   RADIOLOGY WITH ANESTHESIA N/A 07/25/2022   Procedure: RADIOLOGY WITH ANESTHESIA;  Surgeon: Radiologist, Medication, MD;  Location: [facility] OR;  Service: Radiology;  Laterality: N/A;   TEE WITHOUT CARDIOVERSION N/A 07/28/2022   Procedure: TRANSESOPHAGEAL ECHOCARDIOGRAM;  Surgeon: Parke Poisson, MD;  Location[facility] INVASIVE CV LAB;  Service: Cardiovascular;  Laterality: N/A;   Patient Active Problem List   Diagnosis Date Noted   Loud snoring 10/18/2022   Adjustment disorder with mixed anxiety and depressed mood 08/07/2022   Cerebrovascular accident (CVA) due to occlusion of left posterior communicating artery (HCC) 08/01/2022   Acute ischemic left ACA stroke (HCC) 07/25/2022   Arterial ischemic stroke, ACA (anterior cerebral artery), left, acute (HCC) 07/25/2022   DYSLIPIDEMIA 04/24/2007   Renal failure 11/29/2006    Essential hypertension 11/12/2006    ONSET DATE: 07/25/2022  REFERRING DIAG: I61.9 (ICD-10-CM) - CVA (cerebrovascular accident due to intracerebral hemorrhage)   THERAPY DIAG:  Other lack of coordination  Muscle weakness (generalized)  Unsteadiness on feet  Rationale for Evaluation and Treatment: Rehabilitation  SUBJECTIVE:   SUBJECTIVE STATEMENT: Pt denies pain and falls  Pt accompanied by: Self  PERTINENT HISTORY: Pt presented to ED 07/25/2022. "Cranial CT scan negative for acute changes. There was chronic appearing right thalamic lacunar infarct. 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 Status post TNK with interventional radiology for stenting. MRI follow-up showed confluent left ACA territory infarction. Cytotoxic edema with no hemorrhagic transformation. Multiple additional scattered mostly punctate ischemic foci in the left greater than right MCA, PCA and occasionally cerebellar artery territories."  Pt in IRF from 08/01/2022 -08/31/2022 then discharged to OP OT, PT, and ST.   PRECAUTIONS: Fall  WEIGHT BEARING RESTRICTIONS: No  PAIN:  Are you having pain? No  FALLS: Has patient fallen in last 6 months? No  LIVING ENVIRONMENT: Lives with: lives with their family Lives in: House/apartment Stairs: No Has following equipment at home: Dan Humphreys - 2 wheeled, Environmental consultant - 4 wheeled, Wheelchair (manual), bed side commode, and Grab bars  PLOF: Independent; full-time working for [employer]; driving; sports (football and basketball)  PATIENT GOALS: return to PLOF  OBJECTIVE:   HAND DOMINANCE: Right  ADLs: Overall ADLs: min A  IADLs: Shopping: dependent Light housekeeping: dependent Community mobility: dependent  Handwriting: Not legible  MOBILITY STATUS: Needs Assist: manual wheelchair  ACTIVITY TOLERANCE: Activity tolerance: fair  FUNCTIONAL OUTCOME MEASURES: FOTO: Slight: 59 10/23/2022: 79   UPPER EXTREMITY ROM:      AROM Right (eval) Left (eval)  Shoulder flexion WNL WNL  Shoulder abduction WNL WNL  Elbow flexion WNL WNL  Elbow extension WNL WNL  Wrist flexion WNL WNL  Wrist extension WNL WNL  Wrist pronation WNL WNL  Wrist supination WNL WNL   Digit Composite Flexion WNL WNL  Digit Composite Extension WNL WNL  Digit Opposition WNL WNL  (Blank rows = not tested)  RUE: IR to hip; overall bradykinesia with tremor  UPPER EXTREMITY MMT:     MMT Right (eval) Left (eval)  Shoulder flexion 4/5 WNL  Shoulder abduction 4/5 WNL  Elbow flexion 4/5 WNL  Elbow extension 4/5 WNL  (Blank rows = not tested)  HAND FUNCTION: Grip strength: Right: 38.8 lbs; Left: 82.8 lbs 10/23/2022: 57.5 lbs  COORDINATION: 9 Hole Peg test: Right: 139 sec; Left: 32 sec 10/23/2022: 59 seconds  SENSATION: Paresthesias reported in R fingertips  EDEMA: none reported or observed  MUSCLE TONE: WFL  COGNITION: Overall cognitive status: Within functional limits for tasks assessed  VISION: Subjective report: no changes Baseline vision: Wears glasses for reading only Visual history:  none  VISION ASSESSMENT: WFL  PERCEPTION: WFL  PRAXIS: WFL  OBSERVATIONS: Pt appears well-kept. He is able to propel w/c with increased time. Has RW with him.   TODAY'S TREATMENT:      - Verbally reviewed handwriting exercises. Pt currently writing simple sentence with 90% to 100% legibility in print, but labored/difficult.   - Discussed renewal vs. D/C today due to end of current POC - therapist and pt originally thought about possible d/c from O.T. however, pt is planning on returning to work (part time) in October and identified additional goals for work and home - see below LTG section for new goals to work on for renewal period  Education officer, museum ("hunt and peck" method) to get baseline for new goal: 10 wpm, 91% accuracy  Practiced writing with regular pen w/ body angled towards table and paper angled.  Pt has previously  tried different writing aids/adapted pens but prefers regular pen  *Pt told therapist at end of session that he is taking September off from therapy, therefore will adapt renewal timeframe to begin in October - pt agrees with plan   PATIENT EDUCATION: Education details: see above Person educated: Patient Education method: Programmer, multimedia, Demonstration, Verbal cues, and Handouts Education comprehension: verbalized understanding, returned demonstration, and verbal cues required  HOME EXERCISE PROGRAM: 09/12/2022: Chilton Si putty and coordination HEPs 09/27/22: Additional coordination HEP  10/04/22: shoulder girdle/scapula strengthening HEP  10/23/2022: Shoulder slides and alphabet  GOALS:  SHORT TERM GOALS: Target date: 10/10/2022    Patient will demonstrate independence with initial RUE HEP. Baseline: Goal status: MET  2.  Patient will demonstrate at least 45 lbs R grip strength as needed to open jars and other containers. Baseline: 38.8 lbs 10/23/2022: 57.5 lbs Goal status: MET  3.  Pt will report no difficulty holding a dish while washing it.  Baseline: with some difficulty 10/23/2022: has not tried recently; simulated in clinic: little difficulty Goal status: MET   LONG TERM GOALS: Target date: 12/26/22 - 01/26/23    Patient will demonstrate updated RUE HEP with 25% verbal cues or less for proper execution. Baseline:  Goal status: MET  2.  Patient will demonstrate at least 50 lbs R grip strength as needed to open jars and other containers. Baseline: 38.8 lbs 10/23/2022: 57.5 lbs Goal status: MET  3.  Patient will complete nine-hole peg with use of R in 45 seconds or less. Baseline: 139 seconds 10/23/2022: 59 seconds; GOAL UPDATED (< or = 60 seconds originally) Goal status: REVISED  4.  Pt will complete d/c FOTO.  Baseline: 59 10/23/2022: 79 Goal status: IN PROGRESS  5.  Pt will type at 18 wpm maintaining 90% or greater accuracy  Baseline: 10 wpm, 91% accuracy Goal status:  INITIAL  6.  Pt will write paragraph maintaining 100% legibility in print Baseline:  Goal status: INITIAL    7.  Pt will perform environmental scanning with simple physical or cognitive task at 90% accuracy in prep for return to driving Baseline:  Goal status: INITIAL   8.  Pt will be able to pick up items from floor up to 10 # with countertop support for balance Baseline:  Goal status: INITIAL  9.  Pt will demo kitchen tasks (cooking) from w/c level prn and safety to stand at counter to get items from higher shelves Baseline:  Goal status: INITIAL     ASSESSMENT:  CLINICAL IMPRESSION: Renewal completed today to address revised and ongoing goal from original POC as well as new LTG's identified for possible return to work and IADLS (#5-9). Pt has met all STG's and most original LTG's. Pt reports he is unable to come to therapy for the month of September, therefore will begin renewal period for October - pt in agreement.   PERFORMANCE DEFICITS: in functional skills including ADLs, IADLs, coordination, dexterity, strength, Fine motor control, Gross motor control, mobility, endurance, and UE functional use  IMPAIRMENTS: are limiting patient from ADLs, IADLs, work, and leisure.   CO-MORBIDITIES: may have co-morbidities  that affects occupational performance. Patient will benefit from skilled OT to address above impairments and improve overall function.  REHAB POTENTIAL: Good  PLAN:  OT FREQUENCY: 2x/week  OT DURATION: additional 4 weeks BEGINNING in October  PLANNED INTERVENTIONS: self care/ADL training, therapeutic exercise, therapeutic activity, neuromuscular re-education, manual therapy, functional mobility training, splinting, ultrasound, fluidotherapy, patient/family education, DME and/or AE instructions, and Re-evaluation  RECOMMENDED OTHER SERVICES: Receiving PT and ST  CONSULTED AND AGREED WITH PLAN OF CARE: Patient and family member/caregiver  PLAN FOR NEXT SESSION:  progress towards revised and new goals   Sheran Lawless, OT 11/21/2022, 9:32 AM

## 2022-11-20 NOTE — Therapy (Signed)
OUTPATIENT SPEECH LANGUAGE PATHOLOGY TREATMENT NOTE   Patient Name: Joseph Bright MRN: [id number] DOB:April 13, 1975, 47 y.o., male 22 Date: 11/20/2022  PCP: Joseph Retort, MD REFERRING PROVIDER: Charlton Amor, PA-C  END OF SESSION:   End of Session - 11/20/22 1105     Visit Number 14    Number of Visits 25    Date for SLP Re-Evaluation 12/18/22    Authorization Type cigna medicaid - 60 combined - each discipline counts as 1 visit -  physical is his primary concern    SLP Start Time 1100    SLP Stop Time  1145    SLP Time Calculation (min) 45 min    Activity Tolerance Patient tolerated treatment well               Past Medical History:  Diagnosis Date   Hypertension    Stroke [facility])    Past Surgical History:  Procedure Laterality Date   IR CT HEAD LTD  08/07/2022   IR INTRA CRAN STENT  08/07/2022   IR PERCUTANEOUS ART THROMBECTOMY/INFUSION INTRACRANIAL INC DIAG ANGIO  07/25/2022   LOOP RECORDER INSERTION N/A 07/28/2022   Procedure: LOOP RECORDER INSERTION;  Surgeon: Joseph Lemming, MD;  Location: [facility] INVASIVE CV LAB;  Service: Cardiovascular;  Laterality: N/A;   RADIOLOGY WITH ANESTHESIA N/A 07/25/2022   Procedure: RADIOLOGY WITH ANESTHESIA;  Surgeon: Radiologist, Medication, MD;  Location: [facility] OR;  Service: Radiology;  Laterality: N/A;   TEE WITHOUT CARDIOVERSION N/A 07/28/2022   Procedure: TRANSESOPHAGEAL ECHOCARDIOGRAM;  Surgeon: Joseph Poisson, MD;  Location: [facility] INVASIVE CV LAB;  Service: Cardiovascular;  Laterality: N/A;   Patient Active Problem List   Diagnosis Date Noted   Loud snoring 10/18/2022   Adjustment disorder with mixed anxiety and depressed mood 08/07/2022   Cerebrovascular accident (CVA) due to occlusion of left posterior communicating artery (HCC) 08/01/2022   Acute ischemic left ACA stroke (HCC) 07/25/2022   Arterial ischemic stroke, ACA (anterior cerebral artery), left, acute (HCC) 07/25/2022   DYSLIPIDEMIA 04/24/2007   Renal  failure 11/29/2006   Essential hypertension 11/12/2006    ONSET DATE: 07/25/22  REFERRING DIAG: I61.9 (ICD-10-CM) - CVA (cerebrovascular accident due to intracerebral hemorrhage) (HCC)  THERAPY DIAG:  Aphasia  Rationale for Evaluation and Treatment: Rehabilitation  SUBJECTIVE:   SUBJECTIVE STATEMENT: "It's going good" re: speech/language.   PAIN: Are you having pain? No  OBJECTIVE:   TODAY'S TREATMENT:                                                                                                                                         DATE:   11/20/22: Reviewed email he sent with aphasic errors - he verbalized awareness of errors and we generated strategies of double checking his emails and having a co-worker or family member double check his emails. In conversation re: presidential election, [name] expressed concern re: two females as  president and VP - he then spoke about his mom's complaining - he required cues and clarification to make the connection between the topics - he required mod A to make the connection and my re-stating what he meant. 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 Last session is Thursday  11/16/22: Addressed use of anomia compensations and discourse strategies using constrained summarization. SLP presented pt with short video, provided instructions for summarization and use of strategies via visual aid. Pt made aware of goal to reduce fillers. Pt observed to use 15+ fillers over course of 10 sentences, challenges in summarization with pt with usual deviations in topic mx. Pt did have awareness, attempts to return to topic with mod-I. Pt reporting challenges with communication with loved ones because of his aphasia. Further questioning reveals that pt may be having miscommunications d/t lack of  eloquence/theory of mind. Generated strategies to repair miscommunications PRN.   11/13/22: [name] did not re-send email over the weekend, he required extended time to attempt to pull up his sent email, we decided to complete this for home work. Targeted detailed conversation re: [employer] - with halting and some fillers, [name] described the elimination rules and how each team progresses with 2 episodes of word finding where [name] stated "I'm looking for the word" He used compensations as needed. 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   11/09/22: Pt completed HEP, sent to emails to treating SLPs. Demonstration of wheelchair parts sheet and sample email for tech scheduling. Error ID on latter email. SLP A pt in generating strategy of creating checklist to ensure completeness of of x2 types of emails. Pt generates list of x5 work tasks previously performed.  Given list of wheel chair parts, pt able to read name and then describe x4 parts. Response to questions from SLP re: named parts with pt successful Usual fillers and anomia evidenced throughout conversation, with occasional cues to use description when anomia occurs. Some vagueness exhibited, requiring cues from SLP to elicit more concrete language to relay thoughts/ideas to listener unfamiliar with topic.   11/06/22: [name] reports consistent use of verbal compensations and self advocacy with family and friends. He relays a situation where he used verbal compensation successfully. His primary concern is communication at work. Added goals re: this. Role played phone calls for wheel chair service, scheduling service and describing the most common service issues with power chairs with occasional min A and extended time Naming technical parts of manual wheelchair with extended  time, supervision cues. Fillers reduced but remain. [name] is not bothered by fillers as they have decreased. [name] will send his SLP's an email re: setting up a service call with a client.   8/5/24[name] reports some depression and loneliness since being out of work. Targeted compensations for aphasia and fillers/mazing in conversation  using slow rate, descriptions, self advocating. Slow rate and self advocating with supervision cues. Initially fillers 2-3x over 2 minutes, increased as complexity of conversation increased, however 2-3 per utterance is improving. We generated strategies to assist with grief/depression including reaching out to friends, exercise, getting out doors. Resources of stroke support group, aphasia group, social media sights provided. See  patient instructions   10/26/22: Addressed word finding with divergent naming task, with pt demonstrating ability to generate x23 words IND, additional x13 with mod semantic cues. Led pt through structured conversation to address use of word finding strategies and discourse cohesion strategies at conversational level. Cues given for stopping and thinking prior to responding to aid in cohesiveness. Occasional cues needed for use of anomia strategies. Pt with frequent fillers in conversation, mazing and false starts. Reduces when pt allows self time to formulate response prior to speaking.   10/23/22: Pt more alert during today's session, and is now coming to sessions alone because spouse has gone back to work. Pt able to name 10 words within a category during warm up activity. SLP targeted word finding with scattergories. Patient able to generate words in a given category with 40% accuracy. Required frequent mod-A of semantic and phonemic cues as well as immediate feedback to generate more responses. Patient states that word finding is going better at home, and that he is implementing the practice of semantic feature analysis, which has helped.      PATIENT EDUCATION: Education details: See Today's Treatment; aphasia ed, compensatory strategies, see patient instructions Person educated: Patient Education method: Explanation, Demonstration, Verbal cues, and Handouts Education comprehension: verbal cues required and needs further education     GOALS: Goals reviewed with patient? Yes   SHORT TERM GOALS: Target date: 10/23/22   Pt will name 15 items in personally relevant category with rare min A Baseline: 9 items Goal status: MET   2.  Pt will use verbal compensations 4/5 word finding episodes with rare min A Baseline: 1/5 Goal status: MET   3.  Pt will generate complex sentences in structured tasks 4/5x with occasional min A Baseline: no complex sentences Goal status: MET   4.  Pt will carry over 3 strategies to compensate for auditory comprehension Baseline: No strategies Goal status: MET   LONG TERM GOALS: Target date: 12/18/22   Pt will use verbal compensations for aphasia 5/5x in conversation with rare min A Baseline:  Goal status: MET   2.  Pt will generate complex sentences in simple conversation 4/5 turns with occasional min A Baseline: simple sentences only Goal status: MET   3.  Pt will use compensatory strategies to gain a turn in group or fast moving conversation 3x outside of therapy Baseline: no strategy for this Goal status: MET   4.  Pt will reduce fillers of "um" and "ah" to 5 or less over 5 sentences Baseline: 15+ fillers over 3 sentence narrative Goal status: PARTIALLY MET  5. Pt will role play communication interactions in work related scenarios using compensations for aphasia as needed                     Goal Status: ONGOING  6. Pt will generate 4 work related emails with no errors                Goal Status: ONGOING    ASSESSMENT:   CLINICAL IMPRESSION: Patient is a 47 y.o. male who was seen today for moderate aphasia characterized by halting, fillers due to anomia. Murel  continues to improve with word finding in conversation as well as structured naming tasks. He is using verbal compensations for aphasia successfully and reports these strategies are also improving his word finding. Aphasia improved to mild to moderate. Auditory comprehension continues to improve for moderately complex conversation, however he continues to benefit from family being  present to manage medical, legal and financial information/conversations. I recommend skilled ST to maximize communication for safety, independence, and return to PLOF.    OBJECTIVE IMPAIRMENTS: include aphasia. These impairments are limiting patient from return to work, ADLs/IADLs, and effectively communicating at home and in community. Factors affecting potential to achieve goals and functional outcome are financial resources. Patient will benefit from skilled SLP services to address above impairments and improve overall function.   REHAB POTENTIAL: Good   PLAN:   SLP FREQUENCY: 2x/week   SLP DURATION: 12 weeks   PLANNED INTERVENTIONS: Language facilitation, Environmental controls, Cueing hierachy, Cognitive reorganization, Internal/external aids, Functional tasks, Multimodal communication approach, and SLP instruction and feedback    Matteson Blue, Radene Journey, CCC-SLP 11/20/2022, 11:56 AM

## 2022-11-23 ENCOUNTER — Ambulatory Visit: Payer: Managed Care, Other (non HMO) | Admitting: Physical Therapy

## 2022-11-23 ENCOUNTER — Ambulatory Visit: Payer: Managed Care, Other (non HMO) | Admitting: Occupational Therapy

## 2022-11-23 ENCOUNTER — Ambulatory Visit: Payer: Managed Care, Other (non HMO) | Admitting: Speech Pathology

## 2022-11-23 VITALS — BP 131/80 | HR 70

## 2022-11-23 DIAGNOSIS — M6281 Muscle weakness (generalized): Secondary | ICD-10-CM | POA: Diagnosis not present

## 2022-11-23 DIAGNOSIS — R2689 Other abnormalities of gait and mobility: Secondary | ICD-10-CM

## 2022-11-23 DIAGNOSIS — R2681 Unsteadiness on feet: Secondary | ICD-10-CM

## 2022-11-23 DIAGNOSIS — R4701 Aphasia: Secondary | ICD-10-CM

## 2022-11-23 NOTE — Therapy (Signed)
OUTPATIENT SPEECH LANGUAGE PATHOLOGY TREATMENT NOTE   Patient Name: Joseph Bright MRN: [id number] DOB:12/28/1975, 47 y.o., male Today's Date: 11/23/2022  PCP: Noberto Retort, MD REFERRING PROVIDER: Charlton Amor, PA-C  END OF SESSION:   End of Session - 11/23/22 0843     Visit Number 15    Number of Visits 25    Date for SLP Re-Evaluation 12/18/22    Authorization Type cigna medicaid - 60 combined - each discipline counts as 1 visit -  physical is his primary concern    SLP Start Time 0845    SLP Stop Time  0930    SLP Time Calculation (min) 45 min    Activity Tolerance Patient tolerated treatment well               Past Medical History:  Diagnosis Date   Hypertension    Stroke [facility])    Past Surgical History:  Procedure Laterality Date   IR CT HEAD LTD  08/07/2022   IR INTRA CRAN STENT  08/07/2022   IR PERCUTANEOUS ART THROMBECTOMY/INFUSION INTRACRANIAL INC DIAG ANGIO  07/25/2022   LOOP RECORDER INSERTION N/A 07/28/2022   Procedure: LOOP RECORDER INSERTION;  Surgeon: Regan Lemming, MD;  Location: [facility] INVASIVE CV LAB;  Service: Cardiovascular;  Laterality: N/A;   RADIOLOGY WITH ANESTHESIA N/A 07/25/2022   Procedure: RADIOLOGY WITH ANESTHESIA;  Surgeon: Radiologist, Medication, MD;  Location: [facility] OR;  Service: Radiology;  Laterality: N/A;   TEE WITHOUT CARDIOVERSION N/A 07/28/2022   Procedure: TRANSESOPHAGEAL ECHOCARDIOGRAM;  Surgeon: Parke Poisson, MD;  Location: [facility] INVASIVE CV LAB;  Service: Cardiovascular;  Laterality: N/A;   Patient Active Problem List   Diagnosis Date Noted   Loud snoring 10/18/2022   Adjustment disorder with mixed anxiety and depressed mood 08/07/2022   Cerebrovascular accident (CVA) due to occlusion of left posterior communicating artery (HCC) 08/01/2022   Acute ischemic left ACA stroke (HCC) 07/25/2022   Arterial ischemic stroke, ACA (anterior cerebral artery), left, acute (HCC) 07/25/2022   DYSLIPIDEMIA 04/24/2007   Renal  failure 11/29/2006   Essential hypertension 11/12/2006    ONSET DATE: 07/25/22  REFERRING DIAG: I61.9 (ICD-10-CM) - CVA (cerebrovascular accident due to intracerebral hemorrhage) (HCC)  THERAPY DIAG:  Aphasia  Rationale for Evaluation and Treatment: Rehabilitation  SUBJECTIVE:   SUBJECTIVE STATEMENT: Pt endorses readiness to end ST, wants to take month off.   PAIN: Are you having pain? No  OBJECTIVE:   TODAY'S TREATMENT:                                                                                                                                         DATE:   11/23/22: Addressed attention and executive function strategies to support return to work. Direct instruction provided re: strategies with examples given of different examples of how this may look in real life. Handout provided. Reviewed pt generated emails, with pt having  ID mistake on one, does not ID mistake on other. SLP provides checklist, pt still does not ID mistake. SLP gives instructions for more explicit use of checklist to aid in accuracy. Pt then able to demonstrate with min-A, identifies error. Generate list of ideas to keep working on cognitive skills while taking break from therapy.   11/20/22: Reviewed email he sent with aphasic errors - he verbalized awareness of errors and we generated strategies of double checking his emails and having a co-worker or family member double check his emails. In conversation re: presidential election, [place] expressed concern re: two females as president and VP - he then spoke about his mom's complaining - he required cues and clarification to make the connection between the topics - he required mod A to make the connection and my re-stating what he meant. 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 Last session is  Thursday  11/16/22: Addressed use of anomia compensations and discourse strategies using constrained summarization. SLP presented pt with short video, provided instructions for summarization and use of strategies via visual aid. Pt made aware of goal to reduce fillers. Pt observed to use 15+ fillers over course of 10 sentences, challenges in summarization with pt with usual deviations in topic mx. Pt did have awareness, attempts to return to topic with mod-I. Pt reporting challenges with communication with loved ones because of his aphasia. Further questioning reveals that pt may be having miscommunications d/t lack of eloquence/theory of mind. Generated strategies to repair miscommunications PRN.   11/13/22: [name] did not re-send email over the weekend, he required extended time to attempt to pull up his sent email, we decided to complete this for home work. Targeted detailed conversation re: [employer] - with halting and some fillers, [place] described the elimination rules and how each team progresses with 2 episodes of word finding where Deng stated "I'm looking for the word" He used compensations as needed. 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   11/09/22: Pt completed HEP, sent to emails to treating SLPs. Demonstration of wheelchair parts sheet and sample email for tech scheduling. Error ID on latter email. SLP A pt in generating strategy of creating checklist to ensure completeness of of x2 types of emails. Pt generates list of x5 work tasks previously performed.  Given list of wheel chair parts, pt able to read name and then describe x4 parts. Response to questions from SLP re: named parts with pt successful Usual fillers and anomia evidenced throughout conversation, with occasional cues to use description when anomia  occurs. Some vagueness exhibited, requiring cues from SLP to elicit more concrete language to relay thoughts/ideas to listener unfamiliar with topic.   11/06/22: [name] reports consistent use of verbal compensations and self advocacy with family and friends. He relays a situation where he used verbal compensation successfully. His primary concern is communication at work. Added goals re: this. Role played phone calls for wheel chair service, scheduling service and describing the most common service issues with power chairs with occasional min A and extended time Naming technical parts of manual wheelchair with extended time, supervision cues. Fillers reduced but remain. Jahree is not bothered by  fillers as they have decreased. Maclin will send his SLP's an email re: setting up a service call with a client.   8/5/24[name] reports some depression and loneliness since being out of work. Targeted compensations for aphasia and fillers/mazing in conversation  using slow rate, descriptions, self advocating. Slow rate and self advocating with supervision cues. Initially fillers 2-3x over 2 minutes, increased as complexity of conversation increased, however 2-3 per utterance is improving. We generated strategies to assist with grief/depression including reaching out to friends, exercise, getting out doors. Resources of stroke support group, aphasia group, social media sights provided. See patient instructions   10/26/22: Addressed word finding with divergent naming task, with pt demonstrating ability to generate x23 words IND, additional x13 with mod semantic cues. Led pt through structured conversation to address use of word finding strategies and discourse cohesion strategies at conversational level. Cues given for stopping and thinking prior to responding to aid in cohesiveness. Occasional cues needed for use of anomia strategies. Pt with frequent fillers in conversation, mazing and false starts. Reduces  when pt allows self time to formulate response prior to speaking.   10/23/22: Pt more alert during today's session, and is now coming to sessions alone because spouse has gone back to work. Pt able to name 10 words within a category during warm up activity. SLP targeted word finding with scattergories. Patient able to generate words in a given category with 40% accuracy. Required frequent mod-A of semantic and phonemic cues as well as immediate feedback to generate more responses. Patient states that word finding is going better at home, and that he is implementing the practice of semantic feature analysis, which has helped.     PATIENT EDUCATION: Education details: See Today's Treatment; aphasia ed, compensatory strategies, see patient instructions Person educated: Patient Education method: Explanation, Demonstration, Verbal cues, and Handouts Education comprehension: verbal cues required and needs further education     GOALS: Goals reviewed with patient? Yes   SHORT TERM GOALS: Target date: 10/23/22   Pt will name 15 items in personally relevant category with rare min A Baseline: 9 items Goal status: MET   2.  Pt will use verbal compensations 4/5 word finding episodes with rare min A Baseline: 1/5 Goal status: MET   3.  Pt will generate complex sentences in structured tasks 4/5x with occasional min A Baseline: no complex sentences Goal status: MET   4.  Pt will carry over 3 strategies to compensate for auditory comprehension Baseline: No strategies Goal status: MET   LONG TERM GOALS: Target date: 12/18/22   Pt will use verbal compensations for aphasia 5/5x in conversation with rare min A Baseline:  Goal status: MET   2.  Pt will generate complex sentences in simple conversation 4/5 turns with occasional min A Baseline: simple sentences only Goal status: MET   3.  Pt will use compensatory strategies to gain a turn in group or fast moving conversation 3x outside of  therapy Baseline: no strategy for this Goal status: MET   4.  Pt will reduce fillers of "um" and "ah" to 5 or less over 5 sentences Baseline: 15+ fillers over 3 sentence narrative Goal status: PARTIALLY MET  5. Pt will role play communication interactions in work related scenarios using compensations for aphasia as needed                     Goal Status: MET  6. Pt will generate 4 work related emails with  no errors                Goal Status: Partially met   ASSESSMENT:   CLINICAL IMPRESSION: Patient is a 47 y.o. male who was seen today for moderate aphasia characterized by halting, fillers due to anomia. Tamara continues to improve with word finding in conversation as well as structured naming tasks. He is using verbal compensations for aphasia successfully and reports these strategies are also improving his word finding. Aphasia improved to mild to moderate. Auditory comprehension continues to improve for moderately complex conversation, however he continues to benefit from family being present to manage medical, legal and financial information/conversations. I recommend skilled ST to maximize communication for safety, independence, and return to PLOF. However, pt endorsing request for break from therapy at this time. Education provided on seeking new referral when pt is ready to return.    OBJECTIVE IMPAIRMENTS: include aphasia. These impairments are limiting patient from return to work, ADLs/IADLs, and effectively communicating at home and in community. Factors affecting potential to achieve goals and functional outcome are financial resources. Patient will benefit from skilled SLP services to address above impairments and improve overall function.   REHAB POTENTIAL: Good   PLAN:   SLP FREQUENCY: 2x/week   SLP DURATION: 12 weeks   PLANNED INTERVENTIONS: Language facilitation, Environmental controls, Cueing hierachy, Cognitive reorganization, Internal/external aids, Functional  tasks, Multimodal communication approach, and SLP instruction and feedback   SPEECH THERAPY DISCHARGE SUMMARY  Visits from Start of Care: 15  Current functional level related to goals / functional outcomes: Pt reporting successful carryover of strategies and compensations for variety of communication encounters and partners. Pt endorsing increased engagement in home based and self-care tasks.    Remaining deficits: Aphasia, cognitive-communication deficits, auditory processing deficits    Education / Equipment: Communication strategies/compensations, anomia strategies, structured language tasks, aids for attention/processing for work related tasks, HEP   Patient agrees to discharge. Patient goals were partially met. Patient is being discharged due to the patient's request.     Maia Breslow, CCC-SLP 11/23/2022, 8:43 AM

## 2022-11-23 NOTE — Progress Notes (Signed)
[facility] Summary Report / Loop Recorder 

## 2022-11-23 NOTE — Therapy (Signed)
OUTPATIENT PHYSICAL THERAPY NEURO TREATMENT  RECERTIFICATION  Patient Name: Joseph Bright MRN: [id number] DOB:14-Oct-1975, 47 y.o., male Today's Date: 11/23/2022   PCP: Noberto Retort, MD REFERRING PROVIDER: Charlton Amor, PA-C      END OF SESSION:  PT End of Session - 11/23/22 0936     Visit Number 15    Number of Visits 23   recert   Date for PT Re-Evaluation 02/15/23   recert   Authorization Type [employer]    PT Start Time 0935   pt in restroom   PT Stop Time 1013    PT Time Calculation (min) 38 min    Equipment Utilized During Treatment Gait belt    Activity Tolerance Patient tolerated treatment well    Behavior During Therapy WFL for tasks assessed/performed                          Past Medical History:  Diagnosis Date   Hypertension    Stroke [facility])    Past Surgical History:  Procedure Laterality Date   IR CT HEAD LTD  08/07/2022   IR INTRA CRAN STENT  08/07/2022   IR PERCUTANEOUS ART THROMBECTOMY/INFUSION INTRACRANIAL INC DIAG ANGIO  07/25/2022   LOOP RECORDER INSERTION N/A 07/28/2022   Procedure: LOOP RECORDER INSERTION;  Surgeon: Regan Lemming, MD;  Location: [facility] INVASIVE CV LAB;  Service: Cardiovascular;  Laterality: N/A;   RADIOLOGY WITH ANESTHESIA N/A 07/25/2022   Procedure: RADIOLOGY WITH ANESTHESIA;  Surgeon: Radiologist, Medication, MD;  Location: [facility] OR;  Service: Radiology;  Laterality: N/A;   TEE WITHOUT CARDIOVERSION N/A 07/28/2022   Procedure: TRANSESOPHAGEAL ECHOCARDIOGRAM;  Surgeon: Parke Poisson, MD;  Location: [facility] INVASIVE CV LAB;  Service: Cardiovascular;  Laterality: N/A;   Patient Active Problem List   Diagnosis Date Noted   Loud snoring 10/18/2022   Adjustment disorder with mixed anxiety and depressed mood 08/07/2022   Cerebrovascular accident (CVA) due to occlusion of left posterior communicating artery (HCC) 08/01/2022   Acute ischemic left ACA stroke (HCC) 07/25/2022   Arterial ischemic stroke, ACA (anterior  cerebral artery), left, acute (HCC) 07/25/2022   DYSLIPIDEMIA 04/24/2007   Renal failure 11/29/2006   Essential hypertension 11/12/2006    ONSET DATE: 08/24/2022 (referral date)  REFERRING DIAG: I63.9 (ICD-10-CM) - CVA (cerebral vascular accident) (HCC)  THERAPY DIAG:  Muscle weakness (generalized)  Unsteadiness on feet  Other abnormalities of gait and mobility  Rationale for Evaluation and Treatment: Rehabilitation  SUBJECTIVE:  SUBJECTIVE STATEMENT: Pt denies any acute changes since last visit. Pt was able to walk to/from the trash in his apartment complex again with his RW, only had to take one standing rest break on the way back from the trash.   PERTINENT HISTORY:  hypertension, CKD stage IV followed by outpatient nephrology  PAIN:  Are you having pain? No  PRECAUTIONS: Fall and ICD/Pacemaker  WEIGHT BEARING RESTRICTIONS: No  FALLS: Has patient fallen in last 6 months? Yes. Number of falls 1, fell last weekend at home trying to do something by himself, he bumped his head, his ex-wife helped him to get back up after the fall  LIVING ENVIRONMENT: Lives with:  lives with ex-wife Lives in: House/apartment Stairs: No Has following equipment at home: Environmental consultant - 2 wheeled, Wheelchair (manual), shower chair, and bed side commode  PLOF: Needs assistance with ADLs, Needs assistance with homemaking, Needs assistance with gait, and Needs assistance with transfers  PATIENT GOALS: "to walk"  OBJECTIVE:   DIAGNOSTIC FINDINGS:  Brain MRI 07/26/22 IMPRESSION: 1. Confluent Left ACA territory infarct. Cytotoxic edema with no hemorrhagic transformation.   2. Multiple additional scattered mostly punctate ischemic foci in the left greater than right MCA, PCA, and occasionally cerebellar artery  territories. No significant intracranial mass effect.   3. 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   4. See intracranial MRA is reported separately today.    COGNITION: Overall cognitive status: Within functional limits for tasks assessed   SENSATION: N/T in R hemibody, impaired sensation though light touch appears intact  EDEMA:  Swelling in RLE/R foot  POSTURE: rounded shoulders, forward head, and posterior pelvic tilt   LOWER EXTREMITY MMT:    MMT Right Eval Left Eval  Hip flexion 1 5  Hip extension    Hip abduction    Hip adduction    Hip internal rotation    Hip external rotation    Knee flexion 1 5  Knee extension 1 5  Ankle dorsiflexion 0 5  Ankle plantarflexion    Ankle inversion    Ankle eversion    (Blank rows = not tested)     TODAY'S TREATMENT:             Vitals:   11/23/22 0943  BP: 131/80  Pulse: 70           TherAct For LTG assessment:  [facility] PT Assessment - 11/23/22 0946       Ambulation/Gait   Gait velocity 32.8 ft over 26.57 sec =      Standardized Balance Assessment   Standardized Balance Assessment Timed Up and Go Test;Five Times Sit to Stand    Five times sit to stand comments  13.63 sec   BUE support on arms of chair     Timed Up and Go Test   TUG Normal TUG    Normal TUG (seconds) 27.25   with RW            Gait Gait pattern:  vaulting with LLE, decreased hip/knee flexion- Right, decreased ankle dorsiflexion- Right, and poor foot clearance- Right Distance walked: various distances outdoors up/down inclines, across uneven sidewalk, and across uneven grass Assistive device utilized: Environmental consultant - 2 wheeled Level of assistance: Min A Comments: significantly improved RLE clearance across grass as compared to previous session    PATIENT EDUCATION: Education details: continue HEP and plan to pause PT for the next month while pt figures out transportation Person educated:  Patient Education method:  Explanation Education comprehension: verbalized understanding and needs further education  HOME EXERCISE PROGRAM: Access Code: [email or web address] URL: [email or web address] Date: 09/27/2022 Prepared by: Peter Congo  Exercises - Hooklying Heel Slide  - 1 x daily - 7 x weekly - 2 sets - 10-15 reps - Supine Bridge  - 1 x daily - 7 x weekly - 2-3 sets - 15 reps - Clamshell  - 1 x daily - 7 x weekly - 2 sets - 10 reps - Sidelying Reverse Clamshell  - 1 x daily - 7 x weekly - 2 sets - 10 reps - Long Sitting Hamstring Stretch  - 1 x daily - 7 x weekly - 1 sets - 3-5 reps - 30 sec hold - Staggered Sit-to-Stand  - 1 x daily - 7 x weekly - 3 sets - 5 reps  - Side-stepping at [place]  - Heel Raises with Counter Support  - 1 x daily - 7 x weekly - 3 sets - 10 reps - Alternating Step Taps with Counter Support  - 1 x daily - 7 x weekly - 3 sets - 10 reps   GOALS: Goals reviewed with patient? Yes  SHORT TERM GOALS: Target date: 10/23/2022  Pt will be independent with initial outpatient HEP for improved strength, balance, transfers and gait. Baseline: Goal status: MET  2.  Pt will improve 5 x STS to less than or equal to 30 seconds to demonstrate improved functional strength and transfer efficiency.  Baseline: 35.69 sec (7/1), 24.25 sec (7/29) Goal status: MET  3.  Pt will improve normal TUG to less than or equal to 55 seconds for improved functional mobility and decreased fall risk. Baseline: 67 sec with RW (7/1), 39.5 sec with RW (7/29) Goal status: MET  4.  Pt will improve gait velocity to at least .90 ft/sec for improved gait efficiency and performance at CGA level  Baseline: 0.67 ft/sec with RW and CGA (7/1), 0.92 ft/sec with RW and CGA Goal status: MET  5.  Pt will ambulate x 150 ft with LRAD and CGA to demonstrate improved endurance and increased independence with functional mobility. Baseline: 32.8 ft with RW and CGA, 115 ft with RW and CGA  (7/29) Goal status: IN PROGRESS  6.  Pt will initiate stair training as safe and able. Baseline: 8 stairs with 1-2 handrails and min A (7/29) Goal status: MET  LONG TERM GOALS: Target date: 11/23/2022   Pt will be independent with final HEP for improved strength, balance, transfers and gait. Baseline:  Goal status: MET  2.  Pt will improve 5 x STS to less than or equal to 20 seconds to demonstrate improved functional strength and transfer efficiency.  Baseline: 35.69 sec (7/1), 24.25 sec (7/29), 13.63 sec (8/29) Goal status: MET  3.  Pt will improve normal TUG to less than or equal to 35 seconds for improved functional mobility and decreased fall risk. Baseline: 67 sec with RW (7/1), 39.5 sec with RW (7/29), 27.25 sec with RW (8/29) Goal status: MET  4.  Pt will improve gait velocity to at least 1.25 ft/sec for improved gait efficiency and performance at SBA level  Baseline: 0.67 ft/sec with RW and CGA (7/1), 0.92 ft/sec with RW and CGA, 1.23 ft/sec with RW Goal status: IN PROGRESS  5.  Pt will ambulate x 200 ft with LRAD and SBA to demonstrate improved endurance and increased independence with functional mobility. Baseline: 32.8 ft with RW and CGA, 115 ft with RW and CGA (7/29); 345' w/RW  and SBA Goal status: MET  6.  Pt will ascend/descend 12 stairs with LRAD and min A to demonstrate increased independence with functional mobility. Baseline: not assessed at eval, 8 stairs with 1-2 handrails and min A (7/29), 12 stairs with 2 handrails min A (8/26) Goal status: MET  NEW SHORT TERM GOALS:   Target date: 12/21/2022  Pt will improve 5 x STS to less than or equal to 18 seconds to demonstrate improved functional strength and transfer efficiency.  Baseline: 35.69 sec (7/1), 24.25 sec (7/29), 13.63 sec (8/29) Goal status: IN PROGRESS  2.  Pt will improve normal TUG to less than or equal to 25 seconds for improved functional mobility and decreased fall risk. Baseline: 67 sec with RW  (7/1), 39.5 sec with RW (7/29), 27.25 sec with RW (8/29) Goal status: IN PROGRESS  3.  Pt will improve gait velocity to at least 1.3 ft/sec for improved gait efficiency and performance at mod I level  Baseline: 0.67 ft/sec with RW and CGA (7/1), 0.92 ft/sec with RW and CGA, 1.23 ft/sec with RW Goal status: IN PROGRESS  4.  Floor transfer to be assessed and LTG set Baseline: not assessed Goal status: INITIAL  5.  Pt to trial various ADs (QC, SPC, etc) to determine if he can progress from current device of RW Baseline: mod I with RW Goal status: INITIAL    NEW LONG TERM GOALS:  Target date: 01/18/2023  Pt will improve 5 x STS to less than or equal to 15 seconds to demonstrate improved functional strength and transfer efficiency.  Baseline: 35.69 sec (7/1), 24.25 sec (7/29), 13.63 sec (8/29) Goal status: IN PROGRESS  2.  Pt will improve normal TUG to less than or equal to 20 seconds for improved functional mobility and decreased fall risk. Baseline: 67 sec with RW (7/1), 39.5 sec with RW (7/29), 27.25 sec with RW (8/29) Goal status: IN PROGRESS  3.  Pt will improve gait velocity to at least 1.5 ft/sec for improved gait efficiency and performance at mod I level  Baseline: 0.67 ft/sec with RW and CGA (7/1), 0.92 ft/sec with RW and CGA, 1.23 ft/sec with RW Goal status: IN PROGRESS  4.  Floor transfer to be assessed and LTG set Baseline: not assessed Goal status: INITIAL   ASSESSMENT:  CLINICAL IMPRESSION: Emphasis of skilled PT session on reassessing LTG, discussing PT POC, and writing new goals for recertification for PT services this date. Pt has met 5/6 LTG due to being independent with his HEP, improving his 5xSTS and TUG, being able to ambulate 200+ ft, and being able to navigate a flight of stairs. He also improved his gait speed but did not quite meet his LTG set for this. Pt plans to pause PT due to transportation issues over the next month and resume PT services in October.  Pt does continue to benefit from skilled therapy services to work towards increasing his safety and independence with functional mobility, working on R hemibody strength and control, and working towards being able to return to work.     OBJECTIVE IMPAIRMENTS: Abnormal gait, cardiopulmonary status limiting activity, decreased activity tolerance, decreased balance, decreased coordination, decreased endurance, decreased mobility, difficulty walking, decreased strength, increased edema, impaired sensation, and impaired UE functional use.   ACTIVITY LIMITATIONS: carrying, lifting, bending, standing, squatting, stairs, transfers, bed mobility, bathing, toileting, dressing, reach over head, and hygiene/grooming  PARTICIPATION LIMITATIONS: meal prep, cleaning, laundry, interpersonal relationship, driving, shopping, community activity, and occupation  PERSONAL FACTORS: 1-2  comorbidities:    hypertension, CKD stage IV followed outpatient nephrology are also affecting patient's functional outcome.   REHAB POTENTIAL: Good  CLINICAL DECISION MAKING: Stable/uncomplicated  EVALUATION COMPLEXITY: Moderate  PLAN:  PT FREQUENCY: 2x/week  PT DURATION: 8 weeks  PLANNED INTERVENTIONS: Therapeutic exercises, Therapeutic activity, Neuromuscular re-education, Balance training, Gait training, Patient/Family education, Self Care, Joint mobilization, Stair training, Vestibular training, Canalith repositioning, Visual/preceptual remediation/compensation, Orthotic/Fit training, DME instructions, Aquatic Therapy, Dry Needling, Wheelchair mobility training, Cryotherapy, Moist heat, Taping, Manual therapy, and Re-evaluation  PLAN FOR NEXT SESSION:  assess STG and determine patient goals to return to work, etc.    Peter Congo, PT, DPT, CSRS  11/23/22, 12:31 PM

## 2022-11-23 NOTE — Patient Instructions (Addendum)
Strategies to Support Attention and Correct Completion of Task  Take your time -- avoiding time pressure  Avoid multitasking Double check your work -- actually check it :) Create systems to support your thinking -- e.g. your shared work calendar   Strategies for Improving Your Attention and Memory  Use good eye-contact Give the speaker your undivided attention Look directly at the speaker  Complete one task at a time Avoid multitasking Complete one task before starting a new one Write a note to yourself if you think of something else that needs to be done Let others know when you need quiet time and can't be interrupted Don't answer the phone, texts, or emails while you are working on another task  Put aside distracting thoughts If you find your mind wandering, refocus your attention on the speaker Avoid off-topic comments or responses that may divert your attention  If something important comes to mind, let the speaker know and pause to write yourself a note: "Do you mind holding on a minute, I have to write something down." Put thoughts on hold and focus on salient information  Limit distractions in your environment Think about the environment around you Limit background noise by turning off the TV or music, putting your phone away Close the door and work in quiet  Use active listening Actively participate in the conversation to stay focused Paraphrase what you have heard to include the most important details Adding some associations may help you remember Ask questions to clarify certain points Summarize the speaker's comments periodically Avoid nodding your head and using "mhm" responses as these are more passive and don't help your attention  Alert the other person/people It may be helpful to alert your listener to the fact that you may need reminders to keep on track Tell the speaker in advance that you may need to stop them and have them repeat salient information If  you lose focus, interject and let the person know, "I'm sorry, I lost you, can you tell me again?"  Write down information Write down pertinent information as it comes up, such as telephone numbers, names of people, addresses, details from appointments and conversations, etc.   COGNITIVE SKILL DEVELOPMENT:  Meal planning - create weekly meal plan, create a grocery list, make sure you get everything you need.  Finances - create a budget, pay your bills, track your spending  Practice work scenarios, sending emails, familiarizing yourself with work terms  Exercise - create a weekly workout plan, follow through with the plan

## 2022-11-29 ENCOUNTER — Encounter (HOSPITAL_BASED_OUTPATIENT_CLINIC_OR_DEPARTMENT_OTHER): Payer: Managed Care, Other (non HMO) | Admitting: Internal Medicine

## 2022-12-05 ENCOUNTER — Encounter: Payer: Self-pay | Admitting: Physical Medicine & Rehabilitation

## 2022-12-05 ENCOUNTER — Encounter
Payer: Managed Care, Other (non HMO) | Attending: Physical Medicine & Rehabilitation | Admitting: Physical Medicine & Rehabilitation

## 2022-12-05 VITALS — BP 138/87 | HR 87 | Ht 68.0 in | Wt 200.6 lb

## 2022-12-05 DIAGNOSIS — I69398 Other sequelae of cerebral infarction: Secondary | ICD-10-CM | POA: Diagnosis present

## 2022-12-05 DIAGNOSIS — I69351 Hemiplegia and hemiparesis following cerebral infarction affecting right dominant side: Secondary | ICD-10-CM | POA: Insufficient documentation

## 2022-12-05 DIAGNOSIS — R269 Unspecified abnormalities of gait and mobility: Secondary | ICD-10-CM | POA: Diagnosis present

## 2022-12-05 MED ORDER — TIZANIDINE HCL 4 MG PO TABS: 4.0000 mg | ORAL_TABLET | Freq: Every day | ORAL | 2 refills | Status: AC

## 2022-12-05 NOTE — Progress Notes (Signed)
Subjective:    Patient ID: Joseph Bright, male    DOB: May 01, 1975, 47 y.o.   MRN: [id number] admitted to rehab 08/01/2022 for inpatient therapies to consist of PT, ST and OT at least three hours five days a week. Past admission physiatrist, therapy team and rehab RN have worked together to provide customized collaborative inpatient rehab.  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  Patient remained on aspirin and Brilinta per neurology services.  Initially with subcutaneous heparin for DVT prophylaxis discontinued ambulating greater than 100 feet.  Seizure prophylaxis EEG negative Keppra as indicated no seizure activity.  AKI on CKD stage IV follow-up chemistries followed by nephrology services on Renvela as well as sodium bicarb and latest creatinine 4.23.  Permissive hypertension remained controlled patient had been on amlodipine hydralazine labetalol prior to admission resume as needed follow-up outpatient.  Patient had been receiving CPAP while in the hospital and would need outpatient polysomnogram versus sleep study to qualify and plan ambulatory referral to pulmonary services   47 y.o. right-handed male with history of hypertension, CKD stage IV followed outpatient nephrology as well as medical noncompliance, quit smoking 22 years ago.  Per chart review lives with spouse and young daughter.  1 level home multiple steps to entry independent prior to admission.  Presented 07/25/2022 with acute onset of right-sided weakness as well as speech difficulty.  Cranial CT scan negative for acute changes.  There was chronic appearing right thalamic lacunar infarct.  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  Status post TNK with interventional radiology for stenting.  MRI follow-up showed confluent left ACA territory infarction.  Cytotoxic edema with no hemorrhagic transformation.  Multiple additional scattered mostly  punctate ischemic foci in the left greater than right MCA, PCA and occasionally cerebellar artery territories.  No significant mass effect.  Echocardiogram with ejection fraction of 70 to 75% grade 1 diastolic dysfunction.  Patient with reported seizure-like activity.  EEG negative for seizure.  Maintained on Keppra for seizure prophylaxis.  TEE unremarkable no PFO no cardioembolic source.  Neurology follow-up maintained on aspirin as well as Brilinta 90 mg twice daily for CVA prophylaxis.  Underwent loop recorder placement 07/28/2022.    Admit date: 08/01/2022 Discharge date: 08/31/2022 HPI   HPI  09/07/22 47 year old male who is approximately 6 weeks post left ACA infarct.  He initially had right hemiplegia however he regained strength in his right upper extremity although he is tends to use his left side primarily.  He has been doing some very limited ambulation at home.  He is living with his wife.  They have moved to a first-floor apartment.  Patient was employed in the DME business.  His employer has assisted with DME.  They also will be transporting him to outpatient therapy.  12/05/22 The patient has no falls at home.  His wife is no longer assisting him with ambulation into the bathroom. He ambulates around his complex several times a day.  She is back in office .  Worked with SLP for work Counselling psychologist, Nutritional therapist for [employer] as well as [employer] and speaks with customers at work, as well as checking errors for email.    Handling bills at home   Asking for muscle relaxant  Right leg flexor spasms at noc   Has lost 70 lb since CVA! Pain Inventory   My pain is  No pain  LOCATION OF PAIN  No pain  BOWEL Number of stools per week: 5   BLADDER Normal   Mobility walk with assistance use a walker  Function employed # of hrs/week currently out of work Do you have any goals in this area?  yes  Neuro/Psych trouble walking  Prior Studies Any changes  since last visit?  no  Physicians involved in your care Any changes since last visit?  no   History reviewed. No pertinent family history. Social History   Socioeconomic History   Marital status: Married    Spouse name: Not on file   Number of children: Not on file   Years of education: Not on file   Highest education level: Not on file  Occupational History   Not on file  Tobacco Use   Smoking status: Former    Current packs/day: 0.00    Types: Cigarettes    Start date: 04/17/1995    Quit date: 04/16/2000    Years since quitting: 22.6   Smokeless tobacco: Never  Substance and Sexual Activity   Alcohol use: Yes    Comment: weekends/special events   Drug use: Never   Sexual activity: Not on file  Other Topics Concern   Not on file  Social History Narrative   Not on file   Social Determinants of Health   Financial Resource Strain: Not on file  Food Insecurity: Not on file  Transportation Needs: Not on file  Physical Activity: Not on file  Stress: Not on file  Social Connections: Not on file   Past Surgical History:  Procedure Laterality Date   IR CT HEAD LTD  08/07/2022   IR INTRA CRAN STENT  08/07/2022   IR PERCUTANEOUS ART THROMBECTOMY/INFUSION INTRACRANIAL INC DIAG ANGIO  07/25/2022   LOOP RECORDER INSERTION N/A 07/28/2022   Procedure: LOOP RECORDER INSERTION;  Surgeon: Regan Lemming, MD;  Location: [facility] INVASIVE CV LAB;  Service: Cardiovascular;  Laterality: N/A;   RADIOLOGY WITH ANESTHESIA N/A 07/25/2022   Procedure: RADIOLOGY WITH ANESTHESIA;  Surgeon: Radiologist, Medication, MD;  Location: [facility] OR;  Service: Radiology;  Laterality: N/A;   TEE WITHOUT CARDIOVERSION N/A 07/28/2022   Procedure: TRANSESOPHAGEAL ECHOCARDIOGRAM;  Surgeon: Parke Poisson, MD;  Location: [facility] INVASIVE CV LAB;  Service: Cardiovascular;  Laterality: N/A;   Past Medical History:  Diagnosis Date   Hypertension    Stroke (HCC)    BP 138/87   Pulse 87   Ht 5\' 8"  (1.727 m)   Wt 200  lb 9.6 oz (91 kg)   SpO2 95%   BMI 30.50 kg/m   Opioid Risk Score:   Fall Risk Score:  `1  Depression screen [facility] 2/9     12/05/2022    9:11 AM 09/07/2022    1:14 PM  Depression screen PHQ 2/9  Decreased Interest 0 0  Down, Depressed, Hopeless 0 0  PHQ - 2 Score 0 0  Altered sleeping  1  Tired, decreased energy  0  Change in appetite  1  Feeling bad or failure about yourself   0  Trouble concentrating  0  Moving slowly or fidgety/restless  1  Suicidal thoughts  0  PHQ-9 Score  3      Review of Systems  All other systems reviewed and are negative.     Objective:   Physical Exam Vitals and nursing note reviewed.  Constitutional:      Appearance: Normal appearance. He is obese.  HENT:     Head: Normocephalic and  atraumatic.  Eyes:     General: No visual field deficit.    Extraocular Movements: Extraocular movements intact.     Conjunctiva/sclera: Conjunctivae normal.     Pupils: Pupils are equal, round, and reactive to light.  Skin:    General: Skin is warm and dry.  Neurological:     Mental Status: He is alert and oriented to person, place, and time.     Cranial Nerves: No dysarthria.     Motor: Weakness and abnormal muscle tone present.     Coordination: Coordination abnormal. Impaired rapid alternating movements.     Gait: Gait abnormal.     Comments: Motor 5/5 in LUE and LLE  4/5 LUE 2- HF, 3- KE, 0 ankle DF   Psychiatric:        Mood and Affect: Mood normal.        Behavior: Behavior normal.    Mildly prolonged processing speed       Assessment & Plan:   Left ACA infarct with Right HP LE>>UE, making good functional gains is mod I level, doing bills.  Not quite yet ready for return to work.  Will finish  OP therapy PT, OT, SLP , then PMR re eval , if cont to improve resume PT work ~20hrs per week.  Cannot drive due to RLE weakness, coworkers can pick him up RLE flexor spasticity at noc start tizanidine  went over common SE

## 2022-12-11 ENCOUNTER — Inpatient Hospital Stay: Payer: Self-pay | Admitting: Neurology

## 2022-12-18 ENCOUNTER — Ambulatory Visit (INDEPENDENT_AMBULATORY_CARE_PROVIDER_SITE_OTHER): Payer: Managed Care, Other (non HMO)

## 2022-12-18 DIAGNOSIS — I63522 Cerebral infarction due to unspecified occlusion or stenosis of left anterior cerebral artery: Secondary | ICD-10-CM | POA: Diagnosis not present

## 2022-12-19 LAB — CUP PACEART REMOTE DEVICE CHECK
Date Time Interrogation Session: 20240920230143
Implantable Pulse Generator Implant Date: 20240503

## 2022-12-25 ENCOUNTER — Ambulatory Visit: Payer: Managed Care, Other (non HMO) | Attending: Physician Assistant | Admitting: Occupational Therapy

## 2022-12-25 ENCOUNTER — Ambulatory Visit: Payer: Managed Care, Other (non HMO) | Attending: Physician Assistant | Admitting: Physical Therapy

## 2022-12-25 ENCOUNTER — Ambulatory Visit: Payer: Managed Care, Other (non HMO) | Admitting: Occupational Therapy

## 2022-12-25 ENCOUNTER — Encounter: Payer: Self-pay | Admitting: Occupational Therapy

## 2022-12-25 VITALS — BP 152/90 | HR 64

## 2022-12-25 DIAGNOSIS — R2681 Unsteadiness on feet: Secondary | ICD-10-CM

## 2022-12-25 DIAGNOSIS — R208 Other disturbances of skin sensation: Secondary | ICD-10-CM | POA: Diagnosis present

## 2022-12-25 DIAGNOSIS — R29818 Other symptoms and signs involving the nervous system: Secondary | ICD-10-CM | POA: Diagnosis present

## 2022-12-25 DIAGNOSIS — R278 Other lack of coordination: Secondary | ICD-10-CM

## 2022-12-25 DIAGNOSIS — R2689 Other abnormalities of gait and mobility: Secondary | ICD-10-CM | POA: Insufficient documentation

## 2022-12-25 DIAGNOSIS — M6281 Muscle weakness (generalized): Secondary | ICD-10-CM

## 2022-12-25 NOTE — Patient Instructions (Signed)
   Strengthening: Resisted Flexion   Attach tube to door.  Hold tubing with one arm at side. Pull forward and up with elbow straight. Move shoulder through pain-free range of motion, no further than shoulder height. Repeat 15 times per set.  Do 2 sessions per day.    Strengthening: Resisted Extension   Attach one end to door.  Hold tubing in one hand, arm forward. Pull arm back, elbow straight. Repeat 15 times per set. Do 2 sessions per day.   Resisted Horizontal Abduction: Bilateral   Sit or stand, tubing in both hands, palms down and arms out in front. Keeping arms straight, pinch shoulder blades together and stretch arms out. Repeat 15 times per set.  Do 2 sessions per day.  Shoulder Retraction / External Rotation With Band    With band held in front, keep elbows at side while rotating hands apart and pulling shoulder blades back. Hold 3 seconds. Repeat 15 times. Do 2 sessions per day.

## 2022-12-25 NOTE — Therapy (Signed)
OUTPATIENT PHYSICAL THERAPY NEURO TREATMENT   Patient Name: Joseph Bright MRN: [id number] DOB:February 13, 1976, 47 y.o., male Today's Date: 12/25/2022   PCP: Noberto Retort, MD REFERRING PROVIDER: Charlton Amor, PA-C      END OF SESSION:  PT End of Session - 12/25/22 0938     Visit Number 16    Number of Visits 23   recert   Date for PT Re-Evaluation 02/15/23   recert   Authorization Type [employer]    PT Start Time 0935    PT Stop Time 1015    PT Time Calculation (min) 40 min    Equipment Utilized During Treatment Gait belt    Activity Tolerance Patient tolerated treatment well    Behavior During Therapy [facility] for tasks assessed/performed                           Past Medical History:  Diagnosis Date   Hypertension    Stroke [facility])    Past Surgical History:  Procedure Laterality Date   IR CT HEAD LTD  08/07/2022   IR INTRA CRAN STENT  08/07/2022   IR PERCUTANEOUS ART THROMBECTOMY/INFUSION INTRACRANIAL INC DIAG ANGIO  07/25/2022   LOOP RECORDER INSERTION N/A 07/28/2022   Procedure: LOOP RECORDER INSERTION;  Surgeon: Regan Lemming, MD;  Location: [facility] INVASIVE CV LAB;  Service: Cardiovascular;  Laterality: N/A;   RADIOLOGY WITH ANESTHESIA N/A 07/25/2022   Procedure: RADIOLOGY WITH ANESTHESIA;  Surgeon: Radiologist, Medication, MD;  Location: [facility] OR;  Service: Radiology;  Laterality: N/A;   TEE WITHOUT CARDIOVERSION N/A 07/28/2022   Procedure: TRANSESOPHAGEAL ECHOCARDIOGRAM;  Surgeon: Parke Poisson, MD;  Location: [facility] INVASIVE CV LAB;  Service: Cardiovascular;  Laterality: N/A;   Patient Active Problem List   Diagnosis Date Noted   Hemiparesis affecting right side as late effect of cerebrovascular accident (CVA) (HCC) 12/05/2022   Gait disturbance, post-stroke 12/05/2022   Loud snoring 10/18/2022   Adjustment disorder with mixed anxiety and depressed mood 08/07/2022   Cerebrovascular accident (CVA) due to occlusion of left posterior communicating  artery (HCC) 08/01/2022   Acute ischemic left ACA stroke (HCC) 07/25/2022   Arterial ischemic stroke, ACA (anterior cerebral artery), left, acute (HCC) 07/25/2022   DYSLIPIDEMIA 04/24/2007   Renal failure 11/29/2006   Essential hypertension 11/12/2006    ONSET DATE: 08/24/2022 (referral date)  REFERRING DIAG: I63.9 (ICD-10-CM) - CVA (cerebral vascular accident) (HCC)  THERAPY DIAG:  Muscle weakness (generalized)  Unsteadiness on feet  Other abnormalities of gait and mobility  Rationale for Evaluation and Treatment: Rehabilitation  SUBJECTIVE:  SUBJECTIVE STATEMENT: Pt reports his R arm has recovered a lot more than his RLE has over the past month. Pt states he has been doing a lot of walking, walks the whole apartment complex 3 times every day. Describes his walks as "a chore but I love it". Pt reports no falls since last visit, no pain today.  Sees Dr. Kirtland Bouchard 10/25, will be released to go back to work that day if appropriate.  Pt has tried a few steps without an AD, "going slow", otherwise continues to use his RW.   PERTINENT HISTORY:  hypertension, CKD stage IV followed by outpatient nephrology  PAIN:  Are you having pain? No  PRECAUTIONS: Fall and ICD/Pacemaker  WEIGHT BEARING RESTRICTIONS: No  FALLS: Has patient fallen in last 6 months? Yes. Number of falls 1, fell last weekend at home trying to do something by himself, he bumped his head, his ex-wife helped him to get back up after the fall  LIVING ENVIRONMENT: Lives with:  lives with ex-wife Lives in: House/apartment Stairs: No Has following equipment at home: Environmental consultant - 2 wheeled, Wheelchair (manual), shower chair, and bed side commode  PLOF: Needs assistance with ADLs, Needs assistance with homemaking, Needs assistance with gait, and  Needs assistance with transfers  PATIENT GOALS: "to walk"  OBJECTIVE:   DIAGNOSTIC FINDINGS:  Brain MRI 07/26/22 IMPRESSION: 1. Confluent Left ACA territory infarct. Cytotoxic edema with no hemorrhagic transformation.   2. Multiple additional scattered mostly punctate ischemic foci in the left greater than right MCA, PCA, and occasionally cerebellar artery territories. No significant intracranial mass effect.   3. 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   4. See intracranial MRA is reported separately today.    COGNITION: Overall cognitive status: Within functional limits for tasks assessed   SENSATION: N/T in R hemibody, impaired sensation though light touch appears intact  EDEMA:  Swelling in RLE/R foot  POSTURE: rounded shoulders, forward head, and posterior pelvic tilt   LOWER EXTREMITY MMT:    MMT Right Eval Left Eval  Hip flexion 1 5  Hip extension    Hip abduction    Hip adduction    Hip internal rotation    Hip external rotation    Knee flexion 1 5  Knee extension 1 5  Ankle dorsiflexion 0 5  Ankle plantarflexion    Ankle inversion    Ankle eversion    (Blank rows = not tested)     TODAY'S TREATMENT:             Vitals:   12/25/22 0943  BP: (!) 152/90  Pulse: 64   Seated BP assessed at beginning of session. Pt does check his BP at home 2x/day, readings are typically lower than this.          TherAct For STG assessment:  [employer] PT Assessment - 12/25/22 0946       Ambulation/Gait   Gait velocity 32.8 ft over 16 sec = 2.05 ft/sec   with RW     Standardized Balance Assessment   Standardized Balance Assessment Timed Up and Go Test;Five Times Sit to Stand    Five times sit to stand comments  13 sec   with UE; 23 sec no UE     Timed Up and Go Test   TUG Normal TUG    Normal TUG (seconds) 21   with RW             Gait Gait pattern:  vaulting  with LLE, decreased hip/knee flexion- Right,  and decreased ankle dorsiflexion- Right Distance walked: 115 ft Assistive device utilized:  "hurrycane" Level of assistance: CGA Comments: trial gait with "hurrycane", decreased vaulting with LLE, does occasionally catch his RLE on the floor and needs min A for balance and to prevent fall + gait outdoors up/down inclines and across uneven sidewalk with min A; occasional catching of RLE  Gait pattern:  vaulting with LLE, decreased hip/knee flexion- Right, and decreased ankle dorsiflexion- Right Distance walked: 115 ft Assistive device utilized: Single point cane Level of assistance: CGA Comments: trial gait with SPC, increased vaulting with LLE, decreased stability and decreased speed   Encouraged pt to purchase a "hurrycane" but to only use it indoors for now unless he has someone to assist him with gait outdoors. Provided handout for where to purchase device online and will continue to practice with device in clinic setting.   PATIENT EDUCATION: Education details: continue HEP and walking, results of OM and functional implications, where to purchase "hurrycane" and to use indoors for now Person educated: Patient Education method: Explanation and Handouts Education comprehension: verbalized understanding and needs further education  HOME EXERCISE PROGRAM: Access Code: [email or web address] URL: [email or web address] Date: 09/27/2022 Prepared by: Peter Congo  Exercises - Hooklying Heel Slide  - 1 x daily - 7 x weekly - 2 sets - 10-15 reps - Supine Bridge  - 1 x daily - 7 x weekly - 2-3 sets - 15 reps - Clamshell  - 1 x daily - 7 x weekly - 2 sets - 10 reps - Sidelying Reverse Clamshell  - 1 x daily - 7 x weekly - 2 sets - 10 reps - Long Sitting Hamstring Stretch  - 1 x daily - 7 x weekly - 1 sets - 3-5 reps - 30 sec hold - Staggered Sit-to-Stand  - 1 x daily - 7 x weekly - 3 sets - 5 reps  - Side-stepping at [place]  - Heel Raises with Counter Support  - 1 x daily - 7 x  weekly - 3 sets - 10 reps - Alternating Step Taps with Counter Support  - 1 x daily - 7 x weekly - 3 sets - 10 reps   GOALS: Goals reviewed with patient? Yes   NEW SHORT TERM GOALS:   Target date: 12/21/2022  Pt will improve 5 x STS to less than or equal to 18 seconds to demonstrate improved functional strength and transfer efficiency.  Baseline: 35.69 sec (7/1), 24.25 sec (7/29), 13.63 sec (8/29), 13 sec BUE/23 no UE (9/30) Goal status: MET  2.  Pt will improve normal TUG to less than or equal to 25 seconds for improved functional mobility and decreased fall risk. Baseline: 67 sec with RW (7/1), 39.5 sec with RW (7/29), 27.25 sec with RW (8/29), 21 sec with RW (9/30) Goal status: MET  3.  Pt will improve gait velocity to at least 1.3 ft/sec for improved gait efficiency and performance at mod I level  Baseline: 0.67 ft/sec with RW and CGA (7/1), 0.92 ft/sec with RW and CGA, 1.23 ft/sec with RW, 2.05 ft/sec with RW mod I (9/30) Goal status: MET  4.  Floor transfer to be assessed and LTG set Baseline: not assessed Goal status: INITIAL  5.  Pt to trial various ADs (QC, SPC, etc) to determine if he can progress from current device of RW Baseline: mod I with RW, CGA to min A with 4-pronged "hurrycane", min A with SPC (9/30) Goal status:  MET    NEW LONG TERM GOALS:  Target date: 01/18/2023  Pt will improve 5 x STS to less than or equal to 18 seconds with no UE support to demonstrate improved functional strength and transfer efficiency.  Baseline: 35.69 sec (7/1), 24.25 sec (7/29), 13.63 sec (8/29), 13 sec BUE/23 no UE (9/30) Goal status: REVISED/UPGRADED  2.  Pt will improve normal TUG to less than or equal to 20 seconds for improved functional mobility and decreased fall risk. Baseline: 67 sec with RW (7/1), 39.5 sec with RW (7/29), 27.25 sec with RW (8/29), 21 sec with RW (9/30) Goal status: IN PROGRESS  3.  Pt will improve gait velocity to at least 2.25 ft/sec for improved gait  efficiency and performance at mod I level  Baseline: 0.67 ft/sec with RW and CGA (7/1), 0.92 ft/sec with RW and CGA, 1.23 ft/sec with RW, 2.05 ft/sec with RW mod I (9/30) Goal status: REVISED/UPGRADED  4.  Floor transfer to be assessed and LTG set Baseline: not assessed Goal status: INITIAL   ASSESSMENT:  CLINICAL IMPRESSION: Emphasis of skilled PT session on reassessing STG as patient returns to PT after being on pause for the past 30 days in order to conserve visits and let patient figure out transportation. Pt has met 4/4 STG assessed this date, did not assess one remaining goal due to time constraints. Pt exhibits improved functional LE strength, improved balance, and decreased fall risk based on improvements this date. Pt was able to progress from using his RW to using a "hurrycane" this session although he still requires a RW for community mobility and for increased independence. Pt can benefit from continued practice with "hurrycane" in clinic setting before he would be safe to utilize this device independently in the community. Pt does continue to benefit from skilled therapy services to work towards increasing his safety and independence with functional mobility, working on R hemibody strength and control, and working towards being able to return to work as safe and able. Continue per POC.     OBJECTIVE IMPAIRMENTS: Abnormal gait, cardiopulmonary status limiting activity, decreased activity tolerance, decreased balance, decreased coordination, decreased endurance, decreased mobility, difficulty walking, decreased strength, increased edema, impaired sensation, and impaired UE functional use.   ACTIVITY LIMITATIONS: carrying, lifting, bending, standing, squatting, stairs, transfers, bed mobility, bathing, toileting, dressing, reach over head, and hygiene/grooming  PARTICIPATION LIMITATIONS: meal prep, cleaning, laundry, interpersonal relationship, driving, shopping, community activity, and  occupation  PERSONAL FACTORS: 1-2 comorbidities:    hypertension, CKD stage IV followed outpatient nephrology are also affecting patient's functional outcome.   REHAB POTENTIAL: Good  CLINICAL DECISION MAKING: Stable/uncomplicated  EVALUATION COMPLEXITY: Moderate  PLAN:  PT FREQUENCY: 2x/week  PT DURATION: 8 weeks  PLANNED INTERVENTIONS: Therapeutic exercises, Therapeutic activity, Neuromuscular re-education, Balance training, Gait training, Patient/Family education, Self Care, Joint mobilization, Stair training, Vestibular training, Canalith repositioning, Visual/preceptual remediation/compensation, Orthotic/Fit training, DME instructions, Aquatic Therapy, Dry Needling, Wheelchair mobility training, Cryotherapy, Moist heat, Taping, Manual therapy, and Re-evaluation  PLAN FOR NEXT SESSION:  assess one remaining STG (floor transfer and set LTG), determine patient goals to return to work, review HEP, did he purchase "hurrycane" --practice gait outdoors or across uneven/compliant surfaces and inclines with this    Peter Congo, PT, DPT, CSRS  12/25/22, 10:17 AM

## 2022-12-25 NOTE — Therapy (Signed)
OUTPATIENT OCCUPATIONAL THERAPY NEURO TREATMENT  Patient Name: Joseph Bright MRN: [id number] DOB:02/19/1976, 47 y.o., male Today's Date: 12/25/2022  PCP: Noberto Retort, MD  REFERRING PROVIDER: Charlton Amor, PA-C   END OF SESSION:  OT End of Session - 12/25/22 1003     Visit Number 15    Number of Visits 22    Date for OT Re-Evaluation 01/26/23    Authorization Type [employer] (60 VL - OT, PT, ST)    OT Start Time 1017    OT Stop Time 1100    OT Time Calculation (min) 43 min    Activity Tolerance Patient tolerated treatment well    Behavior During Therapy WFL for tasks assessed/performed              Past Medical History:  Diagnosis Date   Hypertension    Stroke [facility])    Past Surgical History:  Procedure Laterality Date   IR CT HEAD LTD  08/07/2022   IR INTRA CRAN STENT  08/07/2022   IR PERCUTANEOUS ART THROMBECTOMY/INFUSION INTRACRANIAL INC DIAG ANGIO  07/25/2022   LOOP RECORDER INSERTION N/A 07/28/2022   Procedure: LOOP RECORDER INSERTION;  Surgeon: Regan Lemming, MD;  Location: [facility] INVASIVE CV LAB;  Service: Cardiovascular;  Laterality: N/A;   RADIOLOGY WITH ANESTHESIA N/A 07/25/2022   Procedure: RADIOLOGY WITH ANESTHESIA;  Surgeon: Radiologist, Medication, MD;  Location: [facility] OR;  Service: Radiology;  Laterality: N/A;   TEE WITHOUT CARDIOVERSION N/A 07/28/2022   Procedure: TRANSESOPHAGEAL ECHOCARDIOGRAM;  Surgeon: Parke Poisson, MD;  Location: [facility] INVASIVE CV LAB;  Service: Cardiovascular;  Laterality: N/A;   Patient Active Problem List   Diagnosis Date Noted   Hemiparesis affecting right side as late effect of cerebrovascular accident (CVA) (HCC) 12/05/2022   Gait disturbance, post-stroke 12/05/2022   Loud snoring 10/18/2022   Adjustment disorder with mixed anxiety and depressed mood 08/07/2022   Cerebrovascular accident (CVA) due to occlusion of left posterior communicating artery (HCC) 08/01/2022   Acute ischemic left ACA stroke (HCC) 07/25/2022    Arterial ischemic stroke, ACA (anterior cerebral artery), left, acute (HCC) 07/25/2022   DYSLIPIDEMIA 04/24/2007   Renal failure 11/29/2006   Essential hypertension 11/12/2006    ONSET DATE: 07/25/2022  REFERRING DIAG: I61.9 (ICD-10-CM) - CVA (cerebrovascular accident due to intracerebral hemorrhage)   THERAPY DIAG:  Muscle weakness (generalized)  Unsteadiness on feet  Other lack of coordination  Other symptoms and signs involving the nervous system  Other disturbances of skin sensation  Rationale for Evaluation and Treatment: Rehabilitation  SUBJECTIVE:   SUBJECTIVE STATEMENT: Pt reports that he has more ROM in arm now.  Pt denies pain and falls  Pt accompanied by: Self  PERTINENT HISTORY: Pt presented to ED 07/25/2022. "Cranial CT scan negative for acute changes. There was chronic appearing right thalamic lacunar infarct. 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 Status post TNK with interventional radiology for stenting. MRI follow-up showed confluent left ACA territory infarction. Cytotoxic edema with no hemorrhagic transformation. Multiple additional scattered mostly punctate ischemic foci in the left greater than right MCA, PCA and occasionally cerebellar artery territories."  Pt in IRF from 08/01/2022 -08/31/2022 then discharged to OP OT, PT, and ST.   PRECAUTIONS: Fall  WEIGHT BEARING RESTRICTIONS: No  PAIN:  Are you having pain? No  FALLS: Has patient fallen in last 6 months? No  LIVING ENVIRONMENT: Lives with: lives with their family Lives in: House/apartment Stairs: No Has following equipment at home:  Walker - 2 wheeled, Environmental consultant - 4 wheeled, Wheelchair (manual), bed side commode, and Grab bars  PLOF: Independent; full-time working for [employer]; driving; sports (football and basketball)  PATIENT GOALS: return to [employer]  OBJECTIVE:   HAND DOMINANCE: Right  ADLs: Overall ADLs: min A  IADLs: Shopping:  dependent Light housekeeping: dependent Community mobility: dependent  Handwriting: Not legible  MOBILITY STATUS: Needs Assist: manual wheelchair  ACTIVITY TOLERANCE: Activity tolerance: fair  FUNCTIONAL OUTCOME MEASURES: FOTO: Slight: 59 10/23/2022: 79   UPPER EXTREMITY ROM:     AROM Right (eval) Left (eval)  Shoulder flexion WNL WNL  Shoulder abduction WNL WNL  Elbow flexion WNL WNL  Elbow extension WNL WNL  Wrist flexion WNL WNL  Wrist extension WNL WNL  Wrist pronation WNL WNL  Wrist supination WNL WNL   Digit Composite Flexion WNL WNL  Digit Composite Extension WNL WNL  Digit Opposition WNL WNL  (Blank rows = not tested)  RUE: IR to hip; overall bradykinesia with tremor  UPPER EXTREMITY MMT:     MMT Right (eval) Left (eval)  Shoulder flexion 4/5 WNL  Shoulder abduction 4/5 WNL  Elbow flexion 4/5 WNL  Elbow extension 4/5 WNL  (Blank rows = not tested)  HAND FUNCTION: Grip strength: Right: 38.8 lbs; Left: 82.8 lbs 10/23/2022: 57.5 lbs  COORDINATION: 9 Hole Peg test: Right: 139 sec; Left: 32 sec 10/23/2022: 59 seconds  SENSATION: Paresthesias reported in R fingertips  EDEMA: none reported or observed  MUSCLE TONE: WFL  COGNITION: Overall cognitive status: Within functional limits for tasks assessed  VISION: Subjective report: no changes Baseline vision: Wears glasses for reading only Visual history:  none  VISION ASSESSMENT: WFL  PERCEPTION: WFL  PRAXIS: WFL  OBSERVATIONS: Pt appears well-kept. He is able to propel w/c with increased time. Has RW with him.   TODAY'S TREATMENT:      Placing small pegs in pegboard using each finger and thumb with min-mod difficulty for incr individual finger movement in prep for typing.  Removing pegs using in-hand manipulation with min difficulty.  Practiced writing shopping list with min decr in legibility with incr time.  Individual finger flexion with palms on table with min-mod difficulty,  improved with repetition, then finger walker for individual finger adduction/adduction in prep for lateral and individual finger movements required for typing.  Recommended pt practice at home and try typing at home.              Pt reports that he has been lifting 10lb weight overhead.  Recommended pt against this due to risk for injury due to compensation patterns.  Pt verbalized understanding--updated theraband HEP.       PATIENT EDUCATION: Education details: Updated Red theraband HEP--see pt instructions  Person educated: Patient Education method: Explanation, Demonstration, Verbal cues, and Handouts Education comprehension: verbalized understanding, returned demonstration, and verbal cues required  HOME EXERCISE PROGRAM: 09/12/2022: [place] Si putty and coordination HEPs 09/27/22: Additional coordination HEP  10/04/22: shoulder girdle/scapula strengthening HEP  10/23/2022: Shoulder slides and alphabet 12/25/2022:  Updated Red theraband HEP--see pt instructions  GOALS:  SHORT TERM GOALS: Target date: 10/10/2022    Patient will demonstrate independence with initial RUE HEP. Baseline: Goal status: MET  2.  Patient will demonstrate at least 45 lbs R grip strength as needed to open jars and other containers. Baseline: 38.8 lbs 10/23/2022: 57.5 lbs Goal status: MET  3.  Pt will report no difficulty holding a dish while washing it.  Baseline: with some difficulty 10/23/2022: has  not tried recently; simulated in clinic: little difficulty Goal status: MET   LONG TERM GOALS: Target date: 12/26/22 - 01/26/23    Patient will demonstrate updated RUE HEP with 25% verbal cues or less for proper execution. Baseline:  Goal status: MET  2.  Patient will demonstrate at least 50 lbs R grip strength as needed to open jars and other containers. Baseline: 38.8 lbs 10/23/2022: 57.5 lbs Goal status: MET  3.  Patient will complete nine-hole peg with use of R in 45 seconds or less. Baseline: 139  seconds 10/23/2022: 59 seconds; GOAL UPDATED (< or = 60 seconds originally) Goal status: REVISED  4.  Pt will complete d/c FOTO.  Baseline: 59 10/23/2022: 79 Goal status: IN PROGRESS  5.  Pt will type at 18 wpm maintaining 90% or greater accuracy  Baseline: 10 wpm, 91% accuracy Goal status: INITIAL  6.  Pt will write paragraph maintaining 100% legibility in print Baseline:  Goal status: INITIAL    7.  Pt will perform environmental scanning with simple physical or cognitive task at 90% accuracy in prep for return to driving Baseline:  Goal status: INITIAL   8.  Pt will be able to pick up items from floor up to 10 # with countertop support for balance Baseline:  Goal status: INITIAL  9.  Pt will demo kitchen tasks (cooking) from w/c level prn and safety to stand at counter to get items from higher shelves Baseline:  Goal status: INITIAL     ASSESSMENT:  CLINICAL IMPRESSION: Pt is progressing with improving isolated finger coordination and RUE strength.    PERFORMANCE DEFICITS: in functional skills including ADLs, IADLs, coordination, dexterity, strength, Fine motor control, Gross motor control, mobility, endurance, and UE functional use  IMPAIRMENTS: are limiting patient from ADLs, IADLs, work, and leisure.   CO-MORBIDITIES: may have co-morbidities  that affects occupational performance. Patient will benefit from skilled OT to address above impairments and improve overall function.  REHAB POTENTIAL: Good  PLAN:  OT FREQUENCY: 2x/week  OT DURATION: additional 4 weeks BEGINNING in October  PLANNED INTERVENTIONS: self care/ADL training, therapeutic exercise, therapeutic activity, neuromuscular re-education, manual therapy, functional mobility training, splinting, ultrasound, fluidotherapy, patient/family education, DME and/or AE instructions, and Re-evaluation  RECOMMENDED OTHER SERVICES: Receiving PT and ST  CONSULTED AND AGREED WITH PLAN OF CARE: Patient and family  member/caregiver  PLAN FOR NEXT SESSION: continue to progress towards revised and new goals; typing practice, standing to pick up items from floor with countertop support   Bijan Ridgley, OTR/L 12/25/2022, 10:19 AM

## 2022-12-26 ENCOUNTER — Encounter: Payer: Self-pay | Admitting: Neurology

## 2022-12-26 ENCOUNTER — Ambulatory Visit (INDEPENDENT_AMBULATORY_CARE_PROVIDER_SITE_OTHER): Payer: Managed Care, Other (non HMO) | Admitting: Neurology

## 2022-12-26 VITALS — BP 135/89 | HR 75 | Ht 68.0 in | Wt 194.0 lb

## 2022-12-26 DIAGNOSIS — I63522 Cerebral infarction due to unspecified occlusion or stenosis of left anterior cerebral artery: Secondary | ICD-10-CM | POA: Diagnosis not present

## 2022-12-26 DIAGNOSIS — G40009 Localization-related (focal) (partial) idiopathic epilepsy and epileptic syndromes with seizures of localized onset, not intractable, without status epilepticus: Secondary | ICD-10-CM | POA: Diagnosis not present

## 2022-12-26 DIAGNOSIS — R29898 Other symptoms and signs involving the musculoskeletal system: Secondary | ICD-10-CM

## 2022-12-26 DIAGNOSIS — I679 Cerebrovascular disease, unspecified: Secondary | ICD-10-CM

## 2022-12-26 NOTE — Progress Notes (Signed)
[facility] [address] [place]. [place] [address] [phone]       OFFICE FOLLOW-UP NOTE  [name] Date of Birth:  1975/03/28 Medical Record Number:  [id number]   HPI: Joseph Bright is a 47 year old pleasant African-American male seen today for initial office follow-up visit following hospital admission for stroke in April 2024.  He is accompanied by his wife.  History is obtained from them and review of electronic medical records.  I personally reviewed pertinent available imaging films in PACS.  He has no significant past medical history except hypertension with noncompliance with medications.  He presented on 07/25/2022 with sudden onset of right leg weakness and difficulty speaking.  EMS was called and code stroke was activated.  Patient met criteria for thrombolysis and after discussing risk-benefit with patient's wife was administered IV TNK uneventfully.  CT head on admission was unremarkable and CT angiogram left ACA occlusion.  After discussion of risk benefits with patient and wife he was taken for emergent mechanical thrombectomy which was achieved successfully with TICI 3 revascularization but the vessel reoccluded but required rescue left ICA angioplasty and stenting.  2D echo showed ejection fraction of 7075%.  TEE showed no cardiac source of embolism or PFO.  Patient had loop recorder inserted and so far paroxysmal A-fib has not yet been found.  LDL cholesterol 74 mg percent.  Triglycerides were elevated at 313.  Hemoglobin A1c was 5.6.  Patient was on no antithrombotics prior to admission and was placed on aspirin and Brilinta for 6 months due to his stent.  He was transferred to inpatient rehab and did well and has been at home for 4 months.  He is getting outpatient physical occupational therapy.  He is able to ambulate with a walker with the right foot brace.  He still has weakness in his right leg but can move it against gravity.  He is tolerating aspirin  and Brilinta well with minor bruising and no bleeding.  States his blood pressure is under good control.  He is currently on short-term disability and plans to return to work next month part-time.  He has quit drinking alcohol and does not smoke anymore.  He is tolerating Crestor well without muscle aches and pains. ROS:   14 system review of systems is positive for leg weakness, difficulty walking, difficulty with balance, bruising and all other systems negative  PMH:  Past Medical History:  Diagnosis Date   Hypertension    Stroke [facility])     Social History:  Social History   Socioeconomic History   Marital status: Married    Spouse name: Not on file   Number of children: Not on file   Years of education: Not on file   Highest education level: Not on file  Occupational History   Not on file  Tobacco Use   Smoking status: Former    Current packs/day: 0.00    Types: Cigarettes    Start date: 04/17/1995    Quit date: 04/16/2000    Years since quitting: 22.7   Smokeless tobacco: Never  Substance and Sexual Activity   Alcohol use: Yes    Comment: weekends/special events   Drug use: Never   Sexual activity: Not on file  Other Topics Concern   Not on file  Social History Narrative   Not on file   Social Determinants of Health   Financial Resource Strain: Not on file  Food Insecurity: Not on file  Transportation Needs: Not on file  Physical Activity: Not on file  Stress: Not on file  Social Connections: Not on file  Intimate Partner Violence: Not on file    Medications:   Current Outpatient Medications on File Prior to Visit  Medication Sig Dispense Refill   acetaminophen (TYLENOL) 325 MG tablet Take 325 mg by mouth daily as needed for mild pain.     amLODipine (NORVASC) 10 MG tablet Take 1 tablet (10 mg total) by mouth daily.     aspirin 81 MG chewable tablet 1 tablet Orally Once a day     carvedilol (COREG) 25 MG tablet Take 1 tablet (25 mg total) by mouth 2 (two)  times daily. 30 tablet 0   hydrALAZINE (APRESOLINE) 50 MG tablet TAKE 1 TABLET BY MOUTH TWICE DAILY for 90 days     levETIRAcetam (KEPPRA XR) 500 MG 24 hr tablet 1 tablet at 10 pm Orally Once a day     rosuvastatin (CRESTOR) 20 MG tablet      sodium bicarbonate 650 MG tablet 2 tablets Orally twice a day     ticagrelor (BRILINTA) 90 MG TABS tablet Take 90 mg by mouth 2 (two) times daily.     tiZANidine (ZANAFLEX) 4 MG tablet Take 1 tablet (4 mg total) by mouth at bedtime. 30 tablet 2   No current facility-administered medications on file prior to visit.    Allergies:  No Known Allergies  Physical Exam General: Mildly obese middle-aged African-American male, seated, in no evident distress Head: head normocephalic and atraumatic.  Neck: supple with no carotid or supraclavicular bruits Cardiovascular: regular rate and rhythm, no murmurs Musculoskeletal: no deformity Skin:  no rash/petichiae Vascular:  Normal pulses all extremities Vitals:   12/26/22 0926  BP: 135/89  Pulse: 75   Neurologic Exam Mental Status: Awake and fully alert. Oriented to place and time. Recent and remote memory intact. Attention span, concentration and fund of knowledge appropriate. Mood and affect appropriate.  Cranial Nerves: Fundoscopic exam reveals sharp disc margins. Pupils equal, briskly reactive to light. Extraocular movements full without nystagmus. Visual fields full to confrontation. Hearing intact. Facial sensation intact. Face, tongue, palate moves normally and symmetrically.  Motor: Mild diminished fine finger movements on the right and orbits left or right upper extremity.  Right lower extremity weakness 3/5 with drift.  Right ankle foot drop.  Tone increased in the right leg compared to the left. Sensory.: intact to touch ,pinprick .position and vibratory sensation.  Coordination: Rapid alternating movements normal in all extremities. Finger-to-nose and heel-to-shin performed accurately  bilaterally. Gait and Station: Arises from chair without difficulty. Stance is normal. .  Walks with right foot AFO with right foot drop with stiffness of the right leg using a walker. Reflexes: 1+ and asymmetric and brisker on the right. Toes downgoing.   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  Patient also had focal seizures which seem well-controlled on Keppra.  Vascular risk factors of hypertension, hyperlipidemia, intracranial stenosis, mild obesity, sleep apnea and remote smoking and drinking.     PLAN:  I had a long d/w patient and his wife about his recent stroke, intracranial stenting, right leg weakness risk for recurrent stroke/TIAs, personally independently reviewed imaging studies and stroke evaluation results and answered questions.Continue aspirin 81 mg daily and Brilinta (ticagrelor) 90 mg bid  for 1 more month and then stop Brilinta and stay on aspirin alone secondary stroke prevention and maintain strict control of hypertension with blood pressure goal below 130/90, diabetes with hemoglobin A1c goal below 6.5% and lipids with LDL cholesterol goal below 70 mg/dL. I also advised the patient to eat a healthy diet with plenty of whole grains, cereals, fruits and vegetables, exercise regularly and maintain ideal body weight .check EEG and lipid profile and hemoglobin A1c.  Patient was advised to use his walker at all times and we also discussed fall safety precautions.  He may return to work part-time and increase gradually as tolerated.  Followup in the future with my nurse practitioner in 3 weeks or call earlier if necessary.Greater than 50% of time during this 35 minute visit was spent on counseling,explanation of diagnosis, planning of further management,  discussion with patient and family and coordination of care Delia Heady, MD Note: This document was prepared with digital dictation and possible smart phrase technology. Any transcriptional errors that result from this process are unintentional

## 2022-12-26 NOTE — Patient Instructions (Signed)
I had a long d/w patient and his wife about his recent stroke, intracranial stenting, right leg weakness risk for recurrent stroke/TIAs, personally independently reviewed imaging studies and stroke evaluation results and answered questions.Continue aspirin 81 mg daily and Brilinta (ticagrelor) 90 mg bid  for 1 more month and then stop Brilinta and stay on aspirin alone secondary stroke prevention and maintain strict control of hypertension with blood pressure goal below 130/90, diabetes with hemoglobin A1c goal below 6.5% and lipids with LDL cholesterol goal below 70 mg/dL. I also advised the patient to eat a healthy diet with plenty of whole grains, cereals, fruits and vegetables, exercise regularly and maintain ideal body weight .check EEG and lipid profile and hemoglobin A1c.  Patient was advised to use his walker at all times and we also discussed fall safety precautions.  He may return to work part-time and increase gradually as tolerated.  Followup in the future with my nurse practitioner in 3 weeks or call earlier if necessary.  Stroke Prevention Some medical conditions and behaviors can lead to a higher chance of having a stroke. You can help prevent a stroke by eating healthy, exercising, not smoking, and managing any medical conditions you have. Stroke is a leading cause of functional impairment. Primary prevention is particularly important because a majority of strokes are first-time events. Stroke changes the lives of not only those who experience a stroke but also their family and other caregivers. How can this condition affect me? A stroke is a medical emergency and should be treated right away. A stroke can lead to brain damage and can sometimes be life-threatening. If a person gets medical treatment right away, there is a better chance of surviving and recovering from a stroke. What can increase my risk? The following medical conditions may increase your risk of a stroke: Cardiovascular  disease. High blood pressure (hypertension). Diabetes. High cholesterol. Sickle cell disease. Blood clotting disorders (hypercoagulable state). Obesity. Sleep disorders (obstructive sleep apnea). Other risk factors include: Being older than age 70. Having a history of blood clots, stroke, or mini-stroke (transient ischemic attack, TIA). Genetic factors, such as race, ethnicity, or a family history of stroke. Smoking cigarettes or using other tobacco products. Taking birth control pills, especially if you also use tobacco. Heavy use of alcohol or drugs, especially cocaine and methamphetamine. Physical inactivity. What actions can I take to prevent this? Manage your health conditions High cholesterol levels. Eating a healthy diet is important for preventing high cholesterol. If cholesterol cannot be managed through diet alone, you may need to take medicines. Take any prescribed medicines to control your cholesterol as told by your health care provider. Hypertension. To reduce your risk of stroke, try to keep your blood pressure below 130/80. Eating a healthy diet and exercising regularly are important for controlling blood pressure. If these steps are not enough to manage your blood pressure, you may need to take medicines. Take any prescribed medicines to control hypertension as told by your health care provider. Ask your health care provider if you should monitor your blood pressure at home. Have your blood pressure checked every year, even if your blood pressure is normal. Blood pressure increases with age and some medical conditions. Diabetes. Eating a healthy diet and exercising regularly are important parts of managing your blood sugar (glucose). If your blood sugar cannot be managed through diet and exercise, you may need to take medicines. Take any prescribed medicines to control your diabetes as told by your health care provider. Get  evaluated for obstructive sleep apnea. Talk to  your health care provider about getting a sleep evaluation if you snore a lot or have excessive sleepiness. Make sure that any other medical conditions you have, such as atrial fibrillation or atherosclerosis, are managed. Nutrition Follow instructions from your health care provider about what to eat or drink to help manage your health condition. These instructions may include: Reducing your daily calorie intake. Limiting how much salt (sodium) you use to 1,500 milligrams (mg) each day. Using only healthy fats for cooking, such as olive oil, canola oil, or sunflower oil. Eating healthy foods. You can do this by: Choosing foods that are high in fiber, such as whole grains, and fresh fruits and vegetables. Eating at least 5 servings of fruits and vegetables a day. Try to fill one-half of your plate with fruits and vegetables at each meal. Choosing lean protein foods, such as lean cuts of meat, poultry without skin, fish, tofu, beans, and nuts. Eating low-fat dairy products. Avoiding foods that are high in sodium. This can help lower blood pressure. Avoiding foods that have saturated fat, trans fat, and cholesterol. This can help prevent high cholesterol. Avoiding processed and prepared foods. Counting your daily carbohydrate intake.  Lifestyle If you drink alcohol: Limit how much you have to: 0-1 drink a day for women who are not pregnant. 0-2 drinks a day for men. Know how much alcohol is in your drink. In the [place] one drink equals one 12 oz bottle of beer ( ), one 5 oz glass of wine ( ), or one 1 oz glass of hard liquor (44mL). Do not use any products that contain nicotine or tobacco. These products include cigarettes, chewing tobacco, and vaping devices, such as e-cigarettes. If you need help quitting, ask your health care provider. Avoid secondhand smoke. Do not use drugs. Activity  Try to stay at a healthy weight. Get at least 30 minutes of exercise on most days, such  as: Fast walking. Biking. Swimming. Medicines Take over-the-counter and prescription medicines only as told by your health care provider. Aspirin or blood thinners (antiplatelets or anticoagulants) may be recommended to reduce your risk of forming blood clots that can lead to stroke. Avoid taking birth control pills. Talk to your health care provider about the risks of taking birth control pills if: You are over 69 years old. You smoke. You get very bad headaches. You have had a blood clot. Where to find more information American Stroke Association: www.strokeassociation.org Get help right away if: You or a loved one has any symptoms of a stroke. "BE FAST" is an easy way to remember the main warning signs of a stroke: B - Balance. Signs are dizziness, sudden trouble walking, or loss of balance. E - Eyes. Signs are trouble seeing or a sudden change in vision. F - Face. Signs are sudden weakness or numbness of the face, or the face or eyelid drooping on one side. A - Arms. Signs are weakness or numbness in an arm. This happens suddenly and usually on one side of the body. S - Speech. Signs are sudden trouble speaking, slurred speech, or trouble understanding what people say. T - Time. Time to call emergency services. Write down what time symptoms started. You or a loved one has other signs of a stroke, such as: A sudden, severe headache with no known cause. Nausea or vomiting. Seizure. These symptoms may represent a serious problem that is an emergency. Do not wait to see if the symptoms will  go away. Get medical help right away. Call your local emergency services (911 in the [place] Do not drive yourself to the hospital. Summary You can help to prevent a stroke by eating healthy, exercising, not smoking, limiting alcohol intake, and managing any medical conditions you may have. Do not use any products that contain nicotine or tobacco. These include cigarettes, chewing tobacco, and vaping  devices, such as e-cigarettes. If you need help quitting, ask your health care provider. Remember "BE FAST" for warning signs of a stroke. Get help right away if you or a loved one has any of these signs. This information is not intended to replace advice given to you by your health care provider. Make sure you discuss any questions you have with your health care provider. Document Revised: 02/13/2022 Document Reviewed: 02/13/2022 Elsevier Patient Education  2024 [employer].

## 2022-12-27 LAB — LIPID PANEL
Chol/HDL Ratio: 2.4 {ratio} (ref 0.0–5.0)
Cholesterol, Total: 104 mg/dL (ref 100–199)
HDL: 43 mg/dL (ref >39)
LDL Chol Calc (NIH): 39 mg/dL (ref 0–99)
Triglycerides: 126 mg/dL (ref 0–149)
VLDL Cholesterol Cal: 22 mg/dL (ref 5–40)

## 2022-12-27 LAB — HEMOGLOBIN A1C
Est. average glucose Bld gHb Est-mCnc: 97 mg/dL
Hgb A1c MFr Bld: 5 % (ref 4.8–5.6)

## 2022-12-28 ENCOUNTER — Ambulatory Visit: Payer: Managed Care, Other (non HMO) | Admitting: Physical Therapy

## 2022-12-28 ENCOUNTER — Ambulatory Visit: Payer: Managed Care, Other (non HMO) | Attending: Physician Assistant | Admitting: Occupational Therapy

## 2022-12-28 VITALS — BP 139/91 | HR 63

## 2022-12-28 DIAGNOSIS — R2681 Unsteadiness on feet: Secondary | ICD-10-CM | POA: Insufficient documentation

## 2022-12-28 DIAGNOSIS — R29818 Other symptoms and signs involving the nervous system: Secondary | ICD-10-CM | POA: Diagnosis present

## 2022-12-28 DIAGNOSIS — M6281 Muscle weakness (generalized): Secondary | ICD-10-CM | POA: Insufficient documentation

## 2022-12-28 DIAGNOSIS — R278 Other lack of coordination: Secondary | ICD-10-CM | POA: Insufficient documentation

## 2022-12-28 DIAGNOSIS — R2689 Other abnormalities of gait and mobility: Secondary | ICD-10-CM | POA: Insufficient documentation

## 2022-12-28 DIAGNOSIS — R208 Other disturbances of skin sensation: Secondary | ICD-10-CM | POA: Insufficient documentation

## 2022-12-28 NOTE — Therapy (Signed)
OUTPATIENT PHYSICAL THERAPY NEURO TREATMENT   Patient Name: Joseph Bright MRN: [id number] DOB:[birth year], 47 y.o., male Today's Date: 12/28/2022   PCP: Noberto Retort, MD REFERRING PROVIDER: Charlton Amor, PA-C      END OF SESSION:  PT End of Session - 12/28/22 0939     Visit Number 17    Number of Visits 23   recert   Date for PT Re-Evaluation 02/15/23   recert   Authorization Type [employer]    PT Start Time 0935    PT Stop Time 1011    PT Time Calculation (min) 36 min    Equipment Utilized During Treatment Gait belt    Activity Tolerance Patient tolerated treatment well    Behavior During Therapy WFL for tasks assessed/performed                            Past Medical History:  Diagnosis Date   Hypertension    Stroke [facility])    Past Surgical History:  Procedure Laterality Date   IR CT HEAD LTD  08/07/2022   IR INTRA CRAN STENT  08/07/2022   IR PERCUTANEOUS ART THROMBECTOMY/INFUSION INTRACRANIAL INC DIAG ANGIO  07/25/2022   LOOP RECORDER INSERTION N/A 07/28/2022   Procedure: LOOP RECORDER INSERTION;  Surgeon: Regan Lemming, MD;  Location: [facility] INVASIVE CV LAB;  Service: Cardiovascular;  Laterality: N/A;   RADIOLOGY WITH ANESTHESIA N/A 07/25/2022   Procedure: RADIOLOGY WITH ANESTHESIA;  Surgeon: Radiologist, Medication, MD;  Location: [facility] OR;  Service: Radiology;  Laterality: N/A;   TEE WITHOUT CARDIOVERSION N/A 07/28/2022   Procedure: TRANSESOPHAGEAL ECHOCARDIOGRAM;  Surgeon: Parke Poisson, MD;  Location: [facility] INVASIVE CV LAB;  Service: Cardiovascular;  Laterality: N/A;   Patient Active Problem List   Diagnosis Date Noted   Hemiparesis affecting right side as late effect of cerebrovascular accident (CVA) (HCC) 12/05/2022   Gait disturbance, post-stroke 12/05/2022   Loud snoring 10/18/2022   Adjustment disorder with mixed anxiety and depressed mood 08/07/2022   Cerebrovascular accident (CVA) due to occlusion of left posterior communicating  artery (HCC) 08/01/2022   Acute ischemic left ACA stroke (HCC) 07/25/2022   Arterial ischemic stroke, ACA (anterior cerebral artery), left, acute (HCC) 07/25/2022   DYSLIPIDEMIA 04/24/2007   Renal failure 11/29/2006   Essential hypertension 11/12/2006    ONSET DATE: 08/24/2022 (referral date)  REFERRING DIAG: I63.9 (ICD-10-CM) - CVA (cerebral vascular accident) (HCC)  THERAPY DIAG:  Muscle weakness (generalized)  Unsteadiness on feet  Other lack of coordination  Rationale for Evaluation and Treatment: Rehabilitation  SUBJECTIVE:  SUBJECTIVE STATEMENT: Pt reports doing well. Got his cane and has been using it around the house. Tripped with it due to standing for too long and his RLE going asleep, but was able to recover. Has used the cane outside for short distances.   Sees Dr. Kirtland Bouchard 10/25, will be released to go back to work that day if appropriate.    PERTINENT HISTORY:  hypertension, CKD stage IV followed by outpatient nephrology  PAIN:  Are you having pain? No  PRECAUTIONS: Fall and ICD/Pacemaker  WEIGHT BEARING RESTRICTIONS: No  FALLS: Has patient fallen in last 6 months? Yes. Number of falls 1, fell last weekend at home trying to do something by himself, he bumped his head, his ex-wife helped him to get back up after the fall  LIVING ENVIRONMENT: Lives with:  lives with ex-wife Lives in: House/apartment Stairs: No Has following equipment at home: Environmental consultant - 2 wheeled, Wheelchair (manual), shower chair, and bed side commode  PLOF: Needs assistance with ADLs, Needs assistance with homemaking, Needs assistance with gait, and Needs assistance with transfers  PATIENT GOALS: "to walk"  OBJECTIVE:   DIAGNOSTIC FINDINGS:  Brain MRI 07/26/22 IMPRESSION: 1. Confluent Left ACA territory  infarct. Cytotoxic edema with no hemorrhagic transformation.   2. Multiple additional scattered mostly punctate ischemic foci in the left greater than right MCA, PCA, and occasionally cerebellar artery territories. No significant intracranial mass effect.   3. 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   4. See intracranial MRA is reported separately today.    COGNITION: Overall cognitive status: Within functional limits for tasks assessed   SENSATION: N/T in R hemibody, impaired sensation though light touch appears intact  EDEMA:  Swelling in RLE/R foot  POSTURE: rounded shoulders, forward head, and posterior pelvic tilt   LOWER EXTREMITY MMT:    MMT Right Eval Left Eval  Hip flexion 1 5  Hip extension    Hip abduction    Hip adduction    Hip internal rotation    Hip external rotation    Knee flexion 1 5  Knee extension 1 5  Ankle dorsiflexion 0 5  Ankle plantarflexion    Ankle inversion    Ankle eversion    (Blank rows = not tested)  VITALS  Vitals:   12/28/22 0946  BP: (!) 139/91  Pulse: 63      TODAY'S TREATMENT:             TherAct Assessed vitals (see above) and WNL for therapy   For STG assessment: Floor Recovery: Patient educated in floor recovery this visit using teach-back for injury assessment and sequencing of task in clinic setting.  Discussion of transfer of skills to variable scenarios outside the clinic.  Patient has most difficulty with performing tall to half-kneel.  Performed 3 times. Pt initially required SBA but progressed to mod I  Caregiver Training:  Caregiver not present..   Level of Assist:  MODI, Verbal/tactile cues, and SBA.   RPE of 2/10   Gait Gait pattern:  vaulting with LLE, decreased hip/knee flexion- Right, and decreased ankle dorsiflexion- Right Distance walked: >500' indoors and outside  Assistive device utilized:  Straight cane w/quad tip  Level of assistance: Modified  independence and SBA Comments: Practiced use of cane on indoor and unlevel outdoor surfaces. Pt mod I w/use of cane indoors but regressed to SBA outdoors due to fatigue and minor LOB to R side. Pt able to take standing rest breaks when necessary.  No catching of R foot noted even w/fatigue. RPE of 5/10 following session.   Inquired about pt's goals for PT and pt unable to provide one. Pt states his biggest barrier to return to work is typing and handwriting, which are being addressed in OT. Pt reports he has used his cane outside already despite being advised not to last session. Informed pt that he may not need all his PT visits as he is doing well and has no personal goals to work towards. Pt reports he would like to keep his visits due to "liking coming here". Will speak to primary PT and discuss POC moving forward.     PATIENT EDUCATION: Education details: continue HEP and walking, safety w/cane, potential to DC next session  Person educated: Patient Education method: Explanation Education comprehension: verbalized understanding and needs further education  HOME EXERCISE PROGRAM: Access Code: [email or web address] URL: [email or web address] Date: 09/27/2022 Prepared by: Peter Congo  Exercises - Hooklying Heel Slide  - 1 x daily - 7 x weekly - 2 sets - 10-15 reps - Supine Bridge  - 1 x daily - 7 x weekly - 2-3 sets - 15 reps - Clamshell  - 1 x daily - 7 x weekly - 2 sets - 10 reps - Sidelying Reverse Clamshell  - 1 x daily - 7 x weekly - 2 sets - 10 reps - Long Sitting Hamstring Stretch  - 1 x daily - 7 x weekly - 1 sets - 3-5 reps - 30 sec hold - Staggered Sit-to-Stand  - 1 x daily - 7 x weekly - 3 sets - 5 reps  - Side-stepping at [place]  - Heel Raises with Counter Support  - 1 x daily - 7 x weekly - 3 sets - 10 reps - Alternating Step Taps with Counter Support  - 1 x daily - 7 x weekly - 3 sets - 10 reps   GOALS: Goals reviewed with patient? Yes   NEW SHORT TERM GOALS:    Target date: 12/21/2022  Pt will improve 5 x STS to less than or equal to 18 seconds to demonstrate improved functional strength and transfer efficiency.  Baseline: 35.69 sec (7/1), 24.25 sec (7/29), 13.63 sec (8/29), 13 sec BUE/23 no UE (9/30) Goal status: MET  2.  Pt will improve normal TUG to less than or equal to 25 seconds for improved functional mobility and decreased fall risk. Baseline: 67 sec with RW (7/1), 39.5 sec with RW (7/29), 27.25 sec with RW (8/29), 21 sec with RW (9/30) Goal status: MET  3.  Pt will improve gait velocity to at least 1.3 ft/sec for improved gait efficiency and performance at mod I level  Baseline: 0.67 ft/sec with RW and CGA (7/1), 0.92 ft/sec with RW and CGA, 1.23 ft/sec with RW, 2.05 ft/sec with RW mod I (9/30) Goal status: MET  4.  Floor transfer to be assessed and LTG set Baseline: not assessed Goal status: MET  5.  Pt to trial various ADs (QC, SPC, etc) to determine if he can progress from current device of RW Baseline: mod I with RW, CGA to min A with 4-pronged "hurrycane", min A with SPC (9/30) Goal status: MET    NEW LONG TERM GOALS:  Target date: 01/18/2023  Pt will improve 5 x STS to less than or equal to 18 seconds with no UE support to demonstrate improved functional strength and transfer efficiency.  Baseline: 35.69 sec (7/1), 24.25 sec (7/29), 13.63 sec (8/29), 13 sec BUE/23 no  UE (9/30) Goal status: REVISED/UPGRADED  2.  Pt will improve normal TUG to less than or equal to 20 seconds for improved functional mobility and decreased fall risk. Baseline: 67 sec with RW (7/1), 39.5 sec with RW (7/29), 27.25 sec with RW (8/29), 21 sec with RW (9/30) Goal status: IN PROGRESS  3.  Pt will improve gait velocity to at least 2.25 ft/sec for improved gait efficiency and performance at mod I level  Baseline: 0.67 ft/sec with RW and CGA (7/1), 0.92 ft/sec with RW and CGA, 1.23 ft/sec with RW, 2.05 ft/sec with RW mod I (9/30) Goal status:  REVISED/UPGRADED  4.  Floor transfer to be assessed and LTG set Baseline: SBA-mod I at baseline  Goal status: DISCONTINUED   ASSESSMENT:  CLINICAL IMPRESSION: Emphasis of skilled PT session on gait training w/cane and assessing floor transfers. Pt performed 3 floor transfers w/SBA initially and quickly progressed to mod I due to increased confidence and sequencing, meeting his final STG. LTG discontinued due to pt being mod I w/activity. Pt continues to require SBA when ambulating outdoors w/cane, but pt reports he has been practicing this at home w/o assistance. Pt will likely not need all PT visits due to performing all mobility tasks independently w/RW and wanting to work on typing for return to work. Continue per POC.     OBJECTIVE IMPAIRMENTS: Abnormal gait, cardiopulmonary status limiting activity, decreased activity tolerance, decreased balance, decreased coordination, decreased endurance, decreased mobility, difficulty walking, decreased strength, increased edema, impaired sensation, and impaired UE functional use.   ACTIVITY LIMITATIONS: carrying, lifting, bending, standing, squatting, stairs, transfers, bed mobility, bathing, toileting, dressing, reach over head, and hygiene/grooming  PARTICIPATION LIMITATIONS: meal prep, cleaning, laundry, interpersonal relationship, driving, shopping, community activity, and occupation  PERSONAL FACTORS: 1-2 comorbidities:    hypertension, CKD stage IV followed outpatient nephrology are also affecting patient's functional outcome.   REHAB POTENTIAL: Good  CLINICAL DECISION MAKING: Stable/uncomplicated  EVALUATION COMPLEXITY: Moderate  PLAN:  PT FREQUENCY: 2x/week  PT DURATION: 8 weeks  PLANNED INTERVENTIONS: Therapeutic exercises, Therapeutic activity, Neuromuscular re-education, Balance training, Gait training, Patient/Family education, Self Care, Joint mobilization, Stair training, Vestibular training, Canalith repositioning,  Visual/preceptual remediation/compensation, Orthotic/Fit training, DME instructions, Aquatic Therapy, Dry Needling, Wheelchair mobility training, Cryotherapy, Moist heat, Taping, Manual therapy, and Re-evaluation  PLAN FOR NEXT SESSION:  Potential DC?  review HEP, practice gait outdoors or across uneven/compliant surfaces and inclines with cane    Jill Alexanders Kijana Cromie, PT, DPT [facility] [address] [place], [place]  [address] Phone:  [phone] Fax:  [phone]  12/28/22, 10:13 AM

## 2022-12-28 NOTE — Progress Notes (Signed)
Kindly inform the patient that cholesterol profile and screening test for diabetes were both quite satisfactory

## 2022-12-28 NOTE — Therapy (Signed)
OUTPATIENT OCCUPATIONAL THERAPY NEURO TREATMENT  Patient Name: Joseph Bright MRN: [id number] DOB:1975/11/10, 47 y.o., male Today's Date: 12/28/2022  PCP: Noberto Retort, MD  REFERRING PROVIDER: Charlton Amor, PA-C   END OF SESSION:  OT End of Session - 12/28/22 1015     Visit Number 16    Number of Visits 22    Date for OT Re-Evaluation 01/26/23    Authorization Type [employer] (60 VL - OT, PT, ST)    OT Start Time 1015    OT Stop Time 1057    OT Time Calculation (min) 42 min    Activity Tolerance Patient tolerated treatment well    Behavior During Therapy WFL for tasks assessed/performed            Past Medical History:  Diagnosis Date   Hypertension    Stroke [facility])    Past Surgical History:  Procedure Laterality Date   IR CT HEAD LTD  08/07/2022   IR INTRA CRAN STENT  08/07/2022   IR PERCUTANEOUS ART THROMBECTOMY/INFUSION INTRACRANIAL INC DIAG ANGIO  07/25/2022   LOOP RECORDER INSERTION N/A 07/28/2022   Procedure: LOOP RECORDER INSERTION;  Surgeon: Regan Lemming, MD;  Location: [facility] INVASIVE CV LAB;  Service: Cardiovascular;  Laterality: N/A;   RADIOLOGY WITH ANESTHESIA N/A 07/25/2022   Procedure: RADIOLOGY WITH ANESTHESIA;  Surgeon: Radiologist, Medication, MD;  Location: [facility] OR;  Service: Radiology;  Laterality: N/A;   TEE WITHOUT CARDIOVERSION N/A 07/28/2022   Procedure: TRANSESOPHAGEAL ECHOCARDIOGRAM;  Surgeon: Parke Poisson, MD;  Location: [facility] INVASIVE CV LAB;  Service: Cardiovascular;  Laterality: N/A;   Patient Active Problem List   Diagnosis Date Noted   Hemiparesis affecting right side as late effect of cerebrovascular accident (CVA) (HCC) 12/05/2022   Gait disturbance, post-stroke 12/05/2022   Loud snoring 10/18/2022   Adjustment disorder with mixed anxiety and depressed mood 08/07/2022   Cerebrovascular accident (CVA) due to occlusion of left posterior communicating artery (HCC) 08/01/2022   Acute ischemic left ACA stroke (HCC) 07/25/2022    Arterial ischemic stroke, ACA (anterior cerebral artery), left, acute (HCC) 07/25/2022   DYSLIPIDEMIA 04/24/2007   Renal failure 11/29/2006   Essential hypertension 11/12/2006    ONSET DATE: 07/25/2022  REFERRING DIAG: I61.9 (ICD-10-CM) - CVA (cerebrovascular accident due to intracerebral hemorrhage)   THERAPY DIAG:  Muscle weakness (generalized)  Other lack of coordination  Other symptoms and signs involving the nervous system  Other disturbances of skin sensation  Rationale for Evaluation and Treatment: Rehabilitation  SUBJECTIVE:   SUBJECTIVE STATEMENT: He has been practicing the exercises from his last visit with noted improvement.   Pt accompanied by: Self  PERTINENT HISTORY: Pt presented to ED 07/25/2022. "Cranial CT scan negative for acute changes. There was chronic appearing right thalamic lacunar infarct. 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 Status post TNK with interventional radiology for stenting. MRI follow-up showed confluent left ACA territory infarction. Cytotoxic edema with no hemorrhagic transformation. Multiple additional scattered mostly punctate ischemic foci in the left greater than right MCA, PCA and occasionally cerebellar artery territories."  Pt in IRF from 08/01/2022 -08/31/2022 then discharged to OP OT, PT, and ST.   PRECAUTIONS: Fall  WEIGHT BEARING RESTRICTIONS: No  PAIN:  Are you having pain? No  FALLS: Has patient fallen in last 6 months? No  LIVING ENVIRONMENT: Lives with: lives with their family Lives in: House/apartment Stairs: No Has following equipment at home: Dan Humphreys - 2 wheeled, Environmental consultant - 4 wheeled,  Wheelchair (manual), bed side commode, and Grab bars  PLOF: Independent; full-time working for [employer]; driving; sports (football and basketball)  PATIENT GOALS: return to PLOF  OBJECTIVE:   HAND DOMINANCE: Right  ADLs: Overall ADLs: min A  IADLs: Shopping: dependent Light  housekeeping: dependent Community mobility: dependent  Handwriting: Not legible  MOBILITY STATUS: Needs Assist: manual wheelchair  ACTIVITY TOLERANCE: Activity tolerance: fair  FUNCTIONAL OUTCOME MEASURES: FOTO: Slight: 59 10/23/2022: 79   UPPER EXTREMITY ROM:     AROM Right (eval) Left (eval)  Shoulder flexion WNL WNL  Shoulder abduction WNL WNL  Elbow flexion WNL WNL  Elbow extension WNL WNL  Wrist flexion WNL WNL  Wrist extension WNL WNL  Wrist pronation WNL WNL  Wrist supination WNL WNL   Digit Composite Flexion WNL WNL  Digit Composite Extension WNL WNL  Digit Opposition WNL WNL  (Blank rows = not tested)  RUE: IR to hip; overall bradykinesia with tremor  UPPER EXTREMITY MMT:     MMT Right (eval) Left (eval)  Shoulder flexion 4/5 WNL  Shoulder abduction 4/5 WNL  Elbow flexion 4/5 WNL  Elbow extension 4/5 WNL  (Blank rows = not tested)  HAND FUNCTION: Grip strength: Right: 38.8 lbs; Left: 82.8 lbs 10/23/2022: 57.5 lbs  COORDINATION: 9 Hole Peg test: Right: 139 sec; Left: 32 sec 10/23/2022: 59 seconds  SENSATION: Paresthesias reported in R fingertips  EDEMA: none reported or observed  MUSCLE TONE: WFL  COGNITION: Overall cognitive status: Within functional limits for tasks assessed  VISION: Subjective report: no changes Baseline vision: Wears glasses for reading only Visual history:  none  VISION ASSESSMENT: WFL  PERCEPTION: WFL  PRAXIS: WFL  OBSERVATIONS: Pt appears well-kept. He is able to propel w/c with increased time. Has RW with him.   TODAY'S TREATMENT:     Small, Medium, and Large bolt, washer, and nut disassembly and assembly x 12 total with use of right hand for manipulation of parts and left hand for stabilization to promote fine motor coordination and strength. Pt also utilizing wrenches, screwdrivers, and hex keys to simulate work specific tasks.   With use of R, pt placed and then removed colored, small pegs into  corresponding hole with use of pattern sheets for ROM, coordination, and strength of affected extremity. Pt picked up pegs using tweezers for additional fine motor and strength challenge.   Pt completed word cancellation search to work toward goal. He was able to complete with 97% accuracy.  PATIENT EDUCATION: Education details: work Clinical cytogeneticist, Diplomatic Services operational officer, typing Person educated: Patient Education method: Programmer, multimedia, Facilities manager, and Verbal cues Education comprehension: verbalized understanding, returned demonstration, and verbal cues required  HOME EXERCISE PROGRAM: 09/12/2022: Chilton Si putty and coordination HEPs 09/27/22: Additional coordination HEP  10/04/22: shoulder girdle/scapula strengthening HEP  10/23/2022: Shoulder slides and alphabet 12/25/2022:  Updated Red theraband HEP--see pt instructions  GOALS:  SHORT TERM GOALS: Target date: 10/10/2022    Patient will demonstrate independence with initial RUE HEP. Baseline: Goal status: MET  2.  Patient will demonstrate at least 45 lbs R grip strength as needed to open jars and other containers. Baseline: 38.8 lbs 10/23/2022: 57.5 lbs Goal status: MET  3.  Pt will report no difficulty holding a dish while washing it.  Baseline: with some difficulty 10/23/2022: has not tried recently; simulated in clinic: little difficulty Goal status: MET   LONG TERM GOALS: Target date: 12/26/22 - 01/26/23    Patient will demonstrate updated RUE HEP with 25% verbal cues or less for  proper execution. Baseline:  Goal status: MET  2.  Patient will demonstrate at least 50 lbs R grip strength as needed to open jars and other containers. Baseline: 38.8 lbs 10/23/2022: 57.5 lbs Goal status: MET  3.  Patient will complete nine-hole peg with use of R in 45 seconds or less. Baseline: 139 seconds 10/23/2022: 59 seconds; GOAL UPDATED (< or = 60 seconds originally) Goal status: REVISED  4.  Pt will complete d/c FOTO.  Baseline: 59 10/23/2022:  79 Goal status: IN PROGRESS  5.  Pt will type at 18 wpm maintaining 90% or greater accuracy  Baseline: 10 wpm, 91% accuracy Goal status: INITIAL  6.  Pt will write paragraph maintaining 100% legibility in print Baseline:  Goal status: INITIAL    7.  Pt will perform environmental scanning with simple physical or cognitive task at 90% accuracy in prep for return to driving Baseline:  16/03/958: 29/30 with word cancellation Goal status: IN PROGRESS   8.  Pt will be able to pick up items from floor up to 10 # with countertop support for balance Baseline:  Goal status: INITIAL  9.  Pt will demo kitchen tasks (cooking) from w/c level prn and safety to stand at counter to get items from higher shelves Baseline:  Goal status: INITIAL     ASSESSMENT:  CLINICAL IMPRESSION: Pt progressing well towards goals. Will continue to focus on work-specific tasks and assess visual scanning and processing in larger environment.   PERFORMANCE DEFICITS: in functional skills including ADLs, IADLs, coordination, dexterity, strength, Fine motor control, Gross motor control, mobility, endurance, and UE functional use  IMPAIRMENTS: are limiting patient from ADLs, IADLs, work, and leisure.   CO-MORBIDITIES: may have co-morbidities  that affects occupational performance. Patient will benefit from skilled OT to address above impairments and improve overall function.  REHAB POTENTIAL: Good  PLAN:  OT FREQUENCY: 2x/week  OT DURATION: additional 4 weeks BEGINNING in October  PLANNED INTERVENTIONS: self care/ADL training, therapeutic exercise, therapeutic activity, neuromuscular re-education, manual therapy, functional mobility training, splinting, ultrasound, fluidotherapy, patient/family education, DME and/or AE instructions, and Re-evaluation  RECOMMENDED OTHER SERVICES: Receiving PT and ST  CONSULTED AND AGREED WITH PLAN OF CARE: Patient and family member/caregiver  PLAN FOR NEXT SESSION: Blaze  pods (standing); continue to progress towards goals; typing practice, standing to pick up items from floor with countertop support; fine motor tasks  Delana Meyer, OTR/L 12/28/2022, 5:13 PM

## 2023-01-01 ENCOUNTER — Encounter: Payer: Self-pay | Admitting: Occupational Therapy

## 2023-01-01 ENCOUNTER — Ambulatory Visit: Payer: Managed Care, Other (non HMO) | Admitting: Occupational Therapy

## 2023-01-01 ENCOUNTER — Ambulatory Visit: Payer: Managed Care, Other (non HMO) | Admitting: Physical Therapy

## 2023-01-01 ENCOUNTER — Telehealth: Payer: Self-pay | Admitting: Anesthesiology

## 2023-01-01 DIAGNOSIS — M6281 Muscle weakness (generalized): Secondary | ICD-10-CM | POA: Diagnosis not present

## 2023-01-01 DIAGNOSIS — R29818 Other symptoms and signs involving the nervous system: Secondary | ICD-10-CM

## 2023-01-01 DIAGNOSIS — R278 Other lack of coordination: Secondary | ICD-10-CM

## 2023-01-01 DIAGNOSIS — R2689 Other abnormalities of gait and mobility: Secondary | ICD-10-CM

## 2023-01-01 DIAGNOSIS — R2681 Unsteadiness on feet: Secondary | ICD-10-CM

## 2023-01-01 NOTE — Therapy (Signed)
OUTPATIENT OCCUPATIONAL THERAPY NEURO TREATMENT  Patient Name: Joseph Bright MRN: [id number] DOB:08/08/1975, 47 y.o., male Today's Date: 01/01/2023  PCP: Noberto Retort, MD  REFERRING PROVIDER: Charlton Amor, PA-C   END OF SESSION:  OT End of Session - 01/01/23 1020     Visit Number 17    Number of Visits 22    Date for OT Re-Evaluation 01/26/23    Authorization Type [employer] (60 VL - OT, PT, ST)    OT Start Time 1018    OT Stop Time 1100    OT Time Calculation (min) 42 min    Activity Tolerance Patient tolerated treatment well    Behavior During Therapy WFL for tasks assessed/performed            Past Medical History:  Diagnosis Date   Hypertension    Stroke [facility])    Past Surgical History:  Procedure Laterality Date   IR CT HEAD LTD  08/07/2022   IR INTRA CRAN STENT  08/07/2022   IR PERCUTANEOUS ART THROMBECTOMY/INFUSION INTRACRANIAL INC DIAG ANGIO  07/25/2022   LOOP RECORDER INSERTION N/A 07/28/2022   Procedure: LOOP RECORDER INSERTION;  Surgeon: Regan Lemming, MD;  Location: [facility] INVASIVE CV LAB;  Service: Cardiovascular;  Laterality: N/A;   RADIOLOGY WITH ANESTHESIA N/A 07/25/2022   Procedure: RADIOLOGY WITH ANESTHESIA;  Surgeon: Radiologist, Medication, MD;  Location: [facility] OR;  Service: Radiology;  Laterality: N/A;   TEE WITHOUT CARDIOVERSION N/A 07/28/2022   Procedure: TRANSESOPHAGEAL ECHOCARDIOGRAM;  Surgeon: Parke Poisson, MD;  Location: [facility] INVASIVE CV LAB;  Service: Cardiovascular;  Laterality: N/A;   Patient Active Problem List   Diagnosis Date Noted   Hemiparesis affecting right side as late effect of cerebrovascular accident (CVA) (HCC) 12/05/2022   Gait disturbance, post-stroke 12/05/2022   Loud snoring 10/18/2022   Adjustment disorder with mixed anxiety and depressed mood 08/07/2022   Cerebrovascular accident (CVA) due to occlusion of left posterior communicating artery (HCC) 08/01/2022   Acute ischemic left ACA stroke (HCC) 07/25/2022    Arterial ischemic stroke, ACA (anterior cerebral artery), left, acute (HCC) 07/25/2022   DYSLIPIDEMIA 04/24/2007   Renal failure 11/29/2006   Essential hypertension 11/12/2006    ONSET DATE: 07/25/2022  REFERRING DIAG: I61.9 (ICD-10-CM) - CVA (cerebrovascular accident due to intracerebral hemorrhage)   THERAPY DIAG:  Other lack of coordination  Unsteadiness on feet  Muscle weakness (generalized)  Rationale for Evaluation and Treatment: Rehabilitation  SUBJECTIVE:   SUBJECTIVE STATEMENT: No pain and no recent falls   Pt accompanied by: Self  PERTINENT HISTORY: Pt presented to ED 07/25/2022. "Cranial CT scan negative for acute changes. There was chronic appearing right thalamic lacunar infarct. 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 Status post TNK with interventional radiology for stenting. MRI follow-up showed confluent left ACA territory infarction. Cytotoxic edema with no hemorrhagic transformation. Multiple additional scattered mostly punctate ischemic foci in the left greater than right MCA, PCA and occasionally cerebellar artery territories."  Pt in IRF from 08/01/2022 -08/31/2022 then discharged to OP OT, PT, and ST.   PRECAUTIONS: Fall  WEIGHT BEARING RESTRICTIONS: No  PAIN:  Are you having pain? No  FALLS: Has patient fallen in last 6 months? No  LIVING ENVIRONMENT: Lives with: lives with their family Lives in: House/apartment Stairs: No Has following equipment at home: Dan Humphreys - 2 wheeled, Environmental consultant - 4 wheeled, Wheelchair (manual), bed side commode, and Grab bars  PLOF: Independent; full-time working for [employer]; driving; sports (  football and basketball)  PATIENT GOALS: return to PLOF  OBJECTIVE:   HAND DOMINANCE: Right  ADLs: Overall ADLs: min A  IADLs: Shopping: dependent Light housekeeping: dependent Community mobility: dependent  Handwriting: Not legible  MOBILITY STATUS: Needs Assist: manual  wheelchair  ACTIVITY TOLERANCE: Activity tolerance: fair  FUNCTIONAL OUTCOME MEASURES: FOTO: Slight: 59 10/23/2022: 79   UPPER EXTREMITY ROM:     AROM Right (eval) Left (eval)  Shoulder flexion WNL WNL  Shoulder abduction WNL WNL  Elbow flexion WNL WNL  Elbow extension WNL WNL  Wrist flexion WNL WNL  Wrist extension WNL WNL  Wrist pronation WNL WNL  Wrist supination WNL WNL   Digit Composite Flexion WNL WNL  Digit Composite Extension WNL WNL  Digit Opposition WNL WNL  (Blank rows = not tested)  RUE: IR to hip; overall bradykinesia with tremor  UPPER EXTREMITY MMT:     MMT Right (eval) Left (eval)  Shoulder flexion 4/5 WNL  Shoulder abduction 4/5 WNL  Elbow flexion 4/5 WNL  Elbow extension 4/5 WNL  (Blank rows = not tested)  HAND FUNCTION: Grip strength: Right: 38.8 lbs; Left: 82.8 lbs 10/23/2022: 57.5 lbs  COORDINATION: 9 Hole Peg test: Right: 139 sec; Left: 32 sec 10/23/2022: 59 seconds  SENSATION: Paresthesias reported in R fingertips  EDEMA: none reported or observed  MUSCLE TONE: WFL  COGNITION: Overall cognitive status: Within functional limits for tasks assessed  VISION: Subjective report: no changes Baseline vision: Wears glasses for reading only Visual history:  none  VISION ASSESSMENT: WFL  PERCEPTION: WFL  PRAXIS: WFL  OBSERVATIONS: Pt appears well-kept. He is able to propel w/c with increased time. Has RW with him.   TODAY'S TREATMENT:      Typing test: 12 wpm, 94% accuracy Practiced tying game: balloons (all letters) easy level, then moderate level  Practiced typing Rt handed typing words with extra time required, 45/53 words correct.  Practiced picking up cones off floor with countertop support going up/down counter x 3.   Practiced wt shifts and side stepping to retrieve and replace items to either side with countertop support prn.   Discussed remaining goals and what to continue to work on    PATIENT  EDUCATION: Education details: work Clinical cytogeneticist, Diplomatic Services operational officer, typing Person educated: Patient Education method: Programmer, multimedia, Facilities manager, and Verbal cues Education comprehension: verbalized understanding, returned demonstration, and verbal cues required  HOME EXERCISE PROGRAM: 09/12/2022: Chilton Si putty and coordination HEPs 09/27/22: Additional coordination HEP  10/04/22: shoulder girdle/scapula strengthening HEP  10/23/2022: Shoulder slides and alphabet 12/25/2022:  Updated Red theraband HEP--see pt instructions  GOALS:  SHORT TERM GOALS: Target date: 10/10/2022    Patient will demonstrate independence with initial RUE HEP. Baseline: Goal status: MET  2.  Patient will demonstrate at least 45 lbs R grip strength as needed to open jars and other containers. Baseline: 38.8 lbs 10/23/2022: 57.5 lbs Goal status: MET  3.  Pt will report no difficulty holding a dish while washing it.  Baseline: with some difficulty 10/23/2022: has not tried recently; simulated in clinic: little difficulty Goal status: MET   LONG TERM GOALS: Target date: 12/26/22 - 01/26/23    Patient will demonstrate updated RUE HEP with 25% verbal cues or less for proper execution. Baseline:  Goal status: MET  2.  Patient will demonstrate at least 50 lbs R grip strength as needed to open jars and other containers. Baseline: 38.8 lbs 10/23/2022: 57.5 lbs Goal status: MET  3.  Patient will complete nine-hole peg with use  of R in 45 seconds or less. Baseline: 139 seconds 10/23/2022: 59 seconds; GOAL UPDATED (< or = 60 seconds originally) Goal status: REVISED  4.  Pt will complete d/c FOTO.  Baseline: 59 10/23/2022: 79 Goal status: IN PROGRESS  5.  Pt will type at 18 wpm maintaining 90% or greater accuracy  Baseline: 10 wpm, 91% accuracy Goal status: in progress (01/01/23 - 12 wpm, 94% accuracy)  6.  Pt will write paragraph maintaining 100% legibility in print Baseline:  Goal status: INITIAL    7.  Pt will  perform environmental scanning with simple physical or cognitive task at 90% accuracy in prep for return to driving Baseline:  29/07/6211: 29/30 with word cancellation Goal status: IN PROGRESS   8.  Pt will be able to pick up items from floor up to 10 # with countertop support for balance Baseline:  Goal status: INITIAL  9.  Pt will demo kitchen tasks (cooking) from w/c level prn and safety to stand at counter to get items from higher shelves Baseline:  Goal status: MET (per pt report)      ASSESSMENT:  CLINICAL IMPRESSION: Pt progressing well towards goals. Will continue to focus on work-specific tasks and assess visual scanning and processing in larger environment.   PERFORMANCE DEFICITS: in functional skills including ADLs, IADLs, coordination, dexterity, strength, Fine motor control, Gross motor control, mobility, endurance, and UE functional use  IMPAIRMENTS: are limiting patient from ADLs, IADLs, work, and leisure.   CO-MORBIDITIES: may have co-morbidities  that affects occupational performance. Patient will benefit from skilled OT to address above impairments and improve overall function.  REHAB POTENTIAL: Good  PLAN:  OT FREQUENCY: 2x/week  OT DURATION: additional 4 weeks BEGINNING in October  PLANNED INTERVENTIONS: self care/ADL training, therapeutic exercise, therapeutic activity, neuromuscular re-education, manual therapy, functional mobility training, splinting, ultrasound, fluidotherapy, patient/family education, DME and/or AE instructions, and Re-evaluation  RECOMMENDED OTHER SERVICES: Receiving PT and ST  CONSULTED AND AGREED WITH PLAN OF CARE: Patient and family member/caregiver  PLAN FOR NEXT SESSION: Blaze pods (standing); practice writing, environmental scanning  Sheran Lawless, OTR/L 01/01/2023, 10:20 AM

## 2023-01-01 NOTE — Progress Notes (Signed)
[facility] Summary Report / Loop Recorder 

## 2023-01-01 NOTE — Therapy (Addendum)
OUTPATIENT PHYSICAL THERAPY NEURO TREATMENT   Patient Name: Joseph Bright MRN: [id number] DOB:04-03-1975, 47 y.o., male Today's Date: 01/01/2023   PCP: Noberto Retort, MD REFERRING PROVIDER: Charlton Amor, PA-C      END OF SESSION:  PT End of Session - 01/01/23 1103     Visit Number 18    Number of Visits 23   recert   Date for PT Re-Evaluation 02/15/23   recert   Authorization Type [employer]    PT Start Time 1103    PT Stop Time 1144    PT Time Calculation (min) 41 min    Equipment Utilized During Treatment Gait belt    Activity Tolerance Patient tolerated treatment well    Behavior During Therapy WFL for tasks assessed/performed                             Past Medical History:  Diagnosis Date   Hypertension    Stroke [facility])    Past Surgical History:  Procedure Laterality Date   IR CT HEAD LTD  08/07/2022   IR INTRA CRAN STENT  08/07/2022   IR PERCUTANEOUS ART THROMBECTOMY/INFUSION INTRACRANIAL INC DIAG ANGIO  07/25/2022   LOOP RECORDER INSERTION N/A 07/28/2022   Procedure: LOOP RECORDER INSERTION;  Surgeon: Regan Lemming, MD;  Location: [facility] INVASIVE CV LAB;  Service: Cardiovascular;  Laterality: N/A;   RADIOLOGY WITH ANESTHESIA N/A 07/25/2022   Procedure: RADIOLOGY WITH ANESTHESIA;  Surgeon: Radiologist, Medication, MD;  Location: [facility] OR;  Service: Radiology;  Laterality: N/A;   TEE WITHOUT CARDIOVERSION N/A 07/28/2022   Procedure: TRANSESOPHAGEAL ECHOCARDIOGRAM;  Surgeon: Parke Poisson, MD;  Location: [facility] INVASIVE CV LAB;  Service: Cardiovascular;  Laterality: N/A;   Patient Active Problem List   Diagnosis Date Noted   Hemiparesis affecting right side as late effect of cerebrovascular accident (CVA) (HCC) 12/05/2022   Gait disturbance, post-stroke 12/05/2022   Loud snoring 10/18/2022   Adjustment disorder with mixed anxiety and depressed mood 08/07/2022   Cerebrovascular accident (CVA) due to occlusion of left posterior communicating  artery (HCC) 08/01/2022   Acute ischemic left ACA stroke (HCC) 07/25/2022   Arterial ischemic stroke, ACA (anterior cerebral artery), left, acute (HCC) 07/25/2022   DYSLIPIDEMIA 04/24/2007   Renal failure 11/29/2006   Essential hypertension 11/12/2006    ONSET DATE: 08/24/2022 (referral date)  REFERRING DIAG: I63.9 (ICD-10-CM) - CVA (cerebral vascular accident) (HCC)  THERAPY DIAG:  Muscle weakness (generalized)  Unsteadiness on feet  Other lack of coordination  Other symptoms and signs involving the nervous system  Other abnormalities of gait and mobility  Rationale for Evaluation and Treatment: Rehabilitation  SUBJECTIVE:  SUBJECTIVE STATEMENT:  Reports bought hurrycane with the flexible bottom and has practiced walking in the house with the cane. Has worn down tennis balls on walker since he has been doing lots of walking at home. Denies falls or acute changes.     PERTINENT HISTORY:  hypertension, CKD stage IV followed by outpatient nephrology  PAIN:  Are you having pain? No  PRECAUTIONS: Fall and ICD/Pacemaker  WEIGHT BEARING RESTRICTIONS: No  FALLS: Has patient fallen in last 6 months? Yes. Number of falls 1, fell prior weekend at home trying to do something by himself, he bumped his head, his ex-wife helped him to get back up after the fall  LIVING ENVIRONMENT: Lives with:  lives with ex-wife Lives in: House/apartment Stairs: No Has following equipment at home: Environmental consultant - 2 wheeled, Wheelchair (manual), shower chair, and bed side commode  PLOF: Needs assistance with ADLs, Needs assistance with homemaking, Needs assistance with gait, and Needs assistance with transfers  PATIENT GOALS: "to walk"  OBJECTIVE:   DIAGNOSTIC FINDINGS:  Brain MRI 07/26/22 IMPRESSION: 1. Confluent  Left ACA territory infarct. Cytotoxic edema with no hemorrhagic transformation.   2. Multiple additional scattered mostly punctate ischemic foci in the left greater than right MCA, PCA, and occasionally cerebellar artery territories. No significant intracranial mass effect.   3. 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   4. See intracranial MRA is reported separately today.    COGNITION: Overall cognitive status: Within functional limits for tasks assessed   SENSATION: N/T in R hemibody, impaired sensation though light touch appears intact  EDEMA:  Swelling in RLE/R foot  POSTURE: rounded shoulders, forward head, and posterior pelvic tilt   LOWER EXTREMITY MMT:    MMT Right Eval Left Eval  Hip flexion 1 5  Hip extension    Hip abduction    Hip adduction    Hip internal rotation    Hip external rotation    Knee flexion 1 5  Knee extension 1 5  Ankle dorsiflexion 0 5  Ankle plantarflexion    Ankle inversion    Ankle eversion    (Blank rows = not tested)  VITALS  There were no vitals filed for this visit.     TODAY'S TREATMENT:        Gait  Ambulated ~400 ft with hurrycane and CGA  Progress to min A when pt's RLE catches on pavement w/ mild LOB over various surfaces including tile, door thresholds, rubber mat, and sidewalk up and down inclines and declines Several 180* turns performed slowly w/o assist Patient demonstrates vaulting w/ LLE, decreased R hip flexion, decrease R knee flexion, and decreased R ankle dorsiflexion Pt notes it is more difficult going up hill than down  Ramp negotiation x4 repetitions up and down with CGA Verbal cues for self reflection, keeping L heel down when going up with R Pt performance worsens as fatigues  With Rolling Walker, ambulate up ramp, down curb, up curb, and down ramp w/ close SBA  With hurrycane, ambulate up ramp, down curb, up curb x2 Patient requires min A for  ascending with hurrycane 2/2 LLE weakness and RLE toes getting caught on lip of step  TherEx From mat table w/o UE use, Staggered STS w/ RLE back x10 reps   From mat table w/o UE use, STS w/ LLE on Airex, small ball between knees 2 sets x 8 reps pt rates as 9/10 difficulty "but I like it"   PATIENT EDUCATION:  Education details: continue HEP and walking, safety w/cane, plan to keep sessions for progression of cane use Person educated: Patient Education method: Explanation Education comprehension: verbalized understanding and needs further education  HOME EXERCISE PROGRAM: Access Code: [email or web address] URL: [email or web address] Date: 09/27/2022 Prepared by: Peter Congo  Exercises - Hooklying Heel Slide  - 1 x daily - 7 x weekly - 2 sets - 10-15 reps - Supine Bridge  - 1 x daily - 7 x weekly - 2-3 sets - 15 reps - Clamshell  - 1 x daily - 7 x weekly - 2 sets - 10 reps - Sidelying Reverse Clamshell  - 1 x daily - 7 x weekly - 2 sets - 10 reps - Long Sitting Hamstring Stretch  - 1 x daily - 7 x weekly - 1 sets - 3-5 reps - 30 sec hold - Staggered Sit-to-Stand  - 1 x daily - 7 x weekly - 3 sets - 5 reps  - Side-stepping at [place]  - Heel Raises with Counter Support  - 1 x daily - 7 x weekly - 3 sets - 10 reps - Alternating Step Taps with Counter Support  - 1 x daily - 7 x weekly - 3 sets - 10 reps   GOALS: Goals reviewed with patient? Yes   NEW SHORT TERM GOALS:   Target date: 12/21/2022  Pt will improve 5 x STS to less than or equal to 18 seconds to demonstrate improved functional strength and transfer efficiency.  Baseline: 35.69 sec (7/1), 24.25 sec (7/29), 13.63 sec (8/29), 13 sec BUE/23 no UE (9/30) Goal status: MET  2.  Pt will improve normal TUG to less than or equal to 25 seconds for improved functional mobility and decreased fall risk. Baseline: 67 sec with RW (7/1), 39.5 sec with RW (7/29), 27.25 sec with RW (8/29), 21 sec with RW (9/30) Goal status:  MET  3.  Pt will improve gait velocity to at least 1.3 ft/sec for improved gait efficiency and performance at mod I level  Baseline: 0.67 ft/sec with RW and CGA (7/1), 0.92 ft/sec with RW and CGA, 1.23 ft/sec with RW, 2.05 ft/sec with RW mod I (9/30) Goal status: MET  4.  Floor transfer to be assessed and LTG set Baseline: not assessed Goal status: MET  5.  Pt to trial various ADs (QC, SPC, etc) to determine if he can progress from current device of RW Baseline: mod I with RW, CGA to min A with 4-pronged "hurrycane", min A with SPC (9/30) Goal status: MET    NEW LONG TERM GOALS:  Target date: 01/18/2023   Pt will improve 5 x STS to less than or equal to 18 seconds with no UE support to demonstrate improved functional strength and transfer efficiency.  Baseline: 35.69 sec (7/1), 24.25 sec (7/29), 13.63 sec (8/29), 13 sec BUE/23 no UE (9/30) Goal status: REVISED/UPGRADED  2.  Pt will improve normal TUG to less than or equal to 20 seconds for improved functional mobility and decreased fall risk. Baseline: 67 sec with RW (7/1), 39.5 sec with RW (7/29), 27.25 sec with RW (8/29), 21 sec with RW (9/30) Goal status: IN PROGRESS  3.  Pt will improve gait velocity to at least 2.25 ft/sec for improved gait efficiency and performance at mod I level  Baseline: 0.67 ft/sec with RW and CGA (7/1), 0.92 ft/sec with RW and CGA, 1.23 ft/sec with RW, 2.05 ft/sec with RW mod I (9/30) Goal status: REVISED/UPGRADED  4.  Floor transfer to  be assessed and LTG set Baseline: SBA-mod I at baseline  Goal status: DISCONTINUED   ASSESSMENT:  CLINICAL IMPRESSION:  Emphasis of skilled PT session on gait training w/cane, curb and ramp negotiation, and sit to stand variations with increased RLE weightbearing. Patient demonstrates increased difficulty ambulating up ramps and inclines with hurrycane due to tendency towards LLE vaulting to compensate for RLE's decreased strength and mobility. Continue per POC.      OBJECTIVE IMPAIRMENTS: Abnormal gait, cardiopulmonary status limiting activity, decreased activity tolerance, decreased balance, decreased coordination, decreased endurance, decreased mobility, difficulty walking, decreased strength, increased edema, impaired sensation, and impaired UE functional use.   ACTIVITY LIMITATIONS: carrying, lifting, bending, standing, squatting, stairs, transfers, bed mobility, bathing, toileting, dressing, reach over head, and hygiene/grooming  PARTICIPATION LIMITATIONS: meal prep, cleaning, laundry, interpersonal relationship, driving, shopping, community activity, and occupation  PERSONAL FACTORS: 1-2 comorbidities:    hypertension, CKD stage IV followed outpatient nephrology are also affecting patient's functional outcome.   REHAB POTENTIAL: Good  CLINICAL DECISION MAKING: Stable/uncomplicated  EVALUATION COMPLEXITY: Moderate  PLAN:  PT FREQUENCY: 2x/week  PT DURATION: 8 weeks  PLANNED INTERVENTIONS: Therapeutic exercises, Therapeutic activity, Neuromuscular re-education, Balance training, Gait training, Patient/Family education, Self Care, Joint mobilization, Stair training, Vestibular training, Canalith repositioning, Visual/preceptual remediation/compensation, Orthotic/Fit training, DME instructions, Aquatic Therapy, Dry Needling, Wheelchair mobility training, Cryotherapy, Moist heat, Taping, Manual therapy, and Re-evaluation  PLAN FOR NEXT SESSION:  review HEP, practice gait outdoors or across uneven/compliant surfaces and inclines with cane, STS w/ theraband for abduction, incline walking w/ cane, curb negotiation and stair negotiation w/ cane, R hip flexion/knee flexion strengthening, discuss PT POC (possibly cancel some appts so he comes in 1x/week?)   Beverely Low, [facility] [address] [place], [place]  [address] Phone:  [phone] Fax:  [phone]  01/01/23, 4:45 PM

## 2023-01-01 NOTE — Telephone Encounter (Signed)
-----   Message from Delia Heady sent at 12/28/2022  5:24 PM EDT ----- Joneen Roach inform the patient that cholesterol profile and screening test for diabetes were both quite satisfactory

## 2023-01-03 ENCOUNTER — Encounter (HOSPITAL_COMMUNITY): Payer: Self-pay

## 2023-01-04 ENCOUNTER — Ambulatory Visit: Payer: Managed Care, Other (non HMO) | Admitting: Occupational Therapy

## 2023-01-04 ENCOUNTER — Ambulatory Visit: Payer: Managed Care, Other (non HMO) | Admitting: Physical Therapy

## 2023-01-04 VITALS — BP 132/90 | HR 72

## 2023-01-04 DIAGNOSIS — M6281 Muscle weakness (generalized): Secondary | ICD-10-CM | POA: Diagnosis not present

## 2023-01-04 DIAGNOSIS — R208 Other disturbances of skin sensation: Secondary | ICD-10-CM

## 2023-01-04 DIAGNOSIS — R29818 Other symptoms and signs involving the nervous system: Secondary | ICD-10-CM

## 2023-01-04 DIAGNOSIS — R2689 Other abnormalities of gait and mobility: Secondary | ICD-10-CM

## 2023-01-04 DIAGNOSIS — R278 Other lack of coordination: Secondary | ICD-10-CM

## 2023-01-04 NOTE — Therapy (Signed)
OUTPATIENT OCCUPATIONAL THERAPY NEURO TREATMENT  Patient Name: Joseph Bright MRN: [id number] DOB:[birth year], 47 y.o., male Today's Date: 01/04/2023  PCP: Noberto Retort, MD  REFERRING PROVIDER: Charlton Amor, PA-C   END OF SESSION:  OT End of Session - 01/04/23 1018     Visit Number 18    Number of Visits 22    Date for OT Re-Evaluation 01/26/23    Authorization Type [employer] (60 VL - OT, PT, ST)    OT Start Time 1018    OT Stop Time 1100    OT Time Calculation (min) 42 min    Equipment Utilized During Treatment gait belt    Activity Tolerance Patient tolerated treatment well    Behavior During Therapy WFL for tasks assessed/performed            Past Medical History:  Diagnosis Date   Hypertension    Stroke [facility])    Past Surgical History:  Procedure Laterality Date   IR CT HEAD LTD  08/07/2022   IR INTRA CRAN STENT  08/07/2022   IR PERCUTANEOUS ART THROMBECTOMY/INFUSION INTRACRANIAL INC DIAG ANGIO  07/25/2022   LOOP RECORDER INSERTION N/A 07/28/2022   Procedure: LOOP RECORDER INSERTION;  Surgeon: Regan Lemming, MD;  Location: [facility] INVASIVE CV LAB;  Service: Cardiovascular;  Laterality: N/A;   RADIOLOGY WITH ANESTHESIA N/A 07/25/2022   Procedure: RADIOLOGY WITH ANESTHESIA;  Surgeon: Radiologist, Medication, MD;  Location: [facility] OR;  Service: Radiology;  Laterality: N/A;   TEE WITHOUT CARDIOVERSION N/A 07/28/2022   Procedure: TRANSESOPHAGEAL ECHOCARDIOGRAM;  Surgeon: Parke Poisson, MD;  Location: [facility] INVASIVE CV LAB;  Service: Cardiovascular;  Laterality: N/A;   Patient Active Problem List   Diagnosis Date Noted   Hemiparesis affecting right side as late effect of cerebrovascular accident (CVA) (HCC) 12/05/2022   Gait disturbance, post-stroke 12/05/2022   Loud snoring 10/18/2022   Adjustment disorder with mixed anxiety and depressed mood 08/07/2022   Cerebrovascular accident (CVA) due to occlusion of left posterior communicating artery (HCC) 08/01/2022   Acute  ischemic left ACA stroke (HCC) 07/25/2022   Arterial ischemic stroke, ACA (anterior cerebral artery), left, acute (HCC) 07/25/2022   DYSLIPIDEMIA 04/24/2007   Renal failure 11/29/2006   Essential hypertension 11/12/2006    ONSET DATE: 07/25/2022  REFERRING DIAG: I61.9 (ICD-10-CM) - CVA (cerebrovascular accident due to intracerebral hemorrhage)   THERAPY DIAG:  Other lack of coordination  Muscle weakness (generalized)  Other symptoms and signs involving the nervous system  Other disturbances of skin sensation  Rationale for Evaluation and Treatment: Rehabilitation  SUBJECTIVE:   SUBJECTIVE STATEMENT: He still finds challenge from his HEP, which he reports completing at home.   Pt accompanied by: Self  PERTINENT HISTORY: Pt presented to ED 07/25/2022. "Cranial CT scan negative for acute changes. There was chronic appearing right thalamic lacunar infarct. 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 Status post TNK with interventional radiology for stenting. MRI follow-up showed confluent left ACA territory infarction. Cytotoxic edema with no hemorrhagic transformation. Multiple additional scattered mostly punctate ischemic foci in the left greater than right MCA, PCA and occasionally cerebellar artery territories."  Pt in IRF from 08/01/2022 -08/31/2022 then discharged to OP OT, PT, and ST.   PRECAUTIONS: Fall  WEIGHT BEARING RESTRICTIONS: No  PAIN:  Are you having pain? No  FALLS: Has patient fallen in last 6 months? No  LIVING ENVIRONMENT: Lives with: lives with their family Lives in: House/apartment Stairs: No Has following equipment at  home: Dan Humphreys - 2 wheeled, Environmental consultant - 4 wheeled, Wheelchair (manual), bed side commode, and Grab bars  PLOF: Independent; full-time working for [employer]; driving; sports (football and basketball)  PATIENT GOALS: return to [employer]  OBJECTIVE:   HAND DOMINANCE: Right  ADLs: Overall ADLs: min  A  IADLs: Shopping: dependent Light housekeeping: dependent Community mobility: dependent  Handwriting: Not legible  MOBILITY STATUS: Needs Assist: manual wheelchair  ACTIVITY TOLERANCE: Activity tolerance: fair  FUNCTIONAL OUTCOME MEASURES: FOTO: Slight: 59 10/23/2022: 79   UPPER EXTREMITY ROM:     AROM Right (eval) Left (eval)  Shoulder flexion WNL WNL  Shoulder abduction WNL WNL  Elbow flexion WNL WNL  Elbow extension WNL WNL  Wrist flexion WNL WNL  Wrist extension WNL WNL  Wrist pronation WNL WNL  Wrist supination WNL WNL   Digit Composite Flexion WNL WNL  Digit Composite Extension WNL WNL  Digit Opposition WNL WNL  (Blank rows = not tested)  RUE: IR to hip; overall bradykinesia with tremor  UPPER EXTREMITY MMT:     MMT Right (eval) Left (eval)  Shoulder flexion 4/5 WNL  Shoulder abduction 4/5 WNL  Elbow flexion 4/5 WNL  Elbow extension 4/5 WNL  (Blank rows = not tested)  HAND FUNCTION: Grip strength: Right: 38.8 lbs; Left: 82.8 lbs 10/23/2022: 57.5 lbs  COORDINATION: 9 Hole Peg test: Right: 139 sec; Left: 32 sec 10/23/2022: 59 seconds  SENSATION: Paresthesias reported in R fingertips  EDEMA: none reported or observed  MUSCLE TONE: WFL  COGNITION: Overall cognitive status: Within functional limits for tasks assessed  VISION: Subjective report: no changes Baseline vision: Wears glasses for reading only Visual history:  none  VISION ASSESSMENT: WFL  PERCEPTION: WFL  PRAXIS: WFL  OBSERVATIONS: Pt appears well-kept. He is able to propel w/c with increased time. Has RW with him.   TODAY'S TREATMENT:      For improved wrist ROM, wrist strength, and fine motor coordination of affected extremity, patient completed affected right hand. Pt required cues and simultaneous use of LUE for task completion and comparison as needed to improve smoothness with coordinated movements.   Patient walked all 5 digits to edge of table and back x10 and  then first 3 digits to edge of table and back x10 for ROM and coordination  Patient completed full composite flexion and extension of digits to progress to edge of table and back x10 for ROM and coordination       Patient completed full composite flexion and extension of digits 1-3 to progress to edge of table and back x10 for ROM and coordination  OT placed 5 BlazePods in front of patient and had patient tap pods with palm of hand and isolated index and middle fingers as pods lit up using distraction mode for improved processing, scanning and locating of items, reaction time, upper extremity range of motion, gross motor coordination, and fine motor coordination. Pt had difficulty incorporating lateral stepping into task and applying enough force as needed to register some of his taps requiring cues and modifications. Hits ranged from 40-60 in 2 minute durations x 3 sets with reaction time ranging from 1.6-2 seconds.    Placement and removal of yellow, red, green, blue, and black resistive clips with use of right 3 point pinch for strengthening of affected extremity. Pt able to place yellow, red, green, and blue clips vertically for additional challenge to shoulder ROM.  With use of R, pt placed and then removed colored, small pegs into corresponding hole  with use of pattern sheets for ROM, coordination, and strength of affected extremity. Pt picked up 3 pegs, stored them in hand, and then placed pegs one at a time using 2 point pinch for additional fine motor challenge.  PATIENT EDUCATION: Education details: coordination; standing balance and safety Person educated: Patient Education method: Explanation, Demonstration, and Verbal cues Education comprehension: verbalized understanding, returned demonstration, and verbal cues required  HOME EXERCISE PROGRAM: 09/12/2022: Chilton Si putty and coordination HEPs 09/27/22: Additional coordination HEP  10/04/22: shoulder girdle/scapula strengthening HEP   10/23/2022: Shoulder slides and alphabet 12/25/2022:  Updated Red theraband HEP--see pt instructions  GOALS:  SHORT TERM GOALS: Target date: 10/10/2022    Patient will demonstrate independence with initial RUE HEP. Baseline: Goal status: MET  2.  Patient will demonstrate at least 45 lbs R grip strength as needed to open jars and other containers. Baseline: 38.8 lbs 10/23/2022: 57.5 lbs Goal status: MET  3.  Pt will report no difficulty holding a dish while washing it.  Baseline: with some difficulty 10/23/2022: has not tried recently; simulated in clinic: little difficulty Goal status: MET   LONG TERM GOALS: Target date: 12/26/22 - 01/26/23    Patient will demonstrate updated RUE HEP with 25% verbal cues or less for proper execution. Baseline:  Goal status: MET  2.  Patient will demonstrate at least 50 lbs R grip strength as needed to open jars and other containers. Baseline: 38.8 lbs 10/23/2022: 57.5 lbs Goal status: MET  3.  Patient will complete nine-hole peg with use of R in 45 seconds or less. Baseline: 139 seconds 10/23/2022: 59 seconds; GOAL UPDATED (< or = 60 seconds originally) Goal status: REVISED  4.  Pt will complete d/c FOTO.  Baseline: 59 10/23/2022: 79 Goal status: IN PROGRESS  5.  Pt will type at 18 wpm maintaining 90% or greater accuracy  Baseline: 10 wpm, 91% accuracy Goal status: in progress (01/01/23 - 12 wpm, 94% accuracy)  6.  Pt will write paragraph maintaining 100% legibility in print Baseline:  Goal status: INITIAL    7.  Pt will perform environmental scanning with simple physical or cognitive task at 90% accuracy in prep for return to driving Baseline:  16/03/958: 29/30 with word cancellation Goal status: IN PROGRESS   8.  Pt will be able to pick up items from floor up to 10 # with countertop support for balance Baseline:  Goal status: INITIAL  9.  Pt will demo kitchen tasks (cooking) from w/c level prn and safety to stand at counter to  get items from higher shelves Baseline:  Goal status: MET (per pt report)      ASSESSMENT:  CLINICAL IMPRESSION: Pt demonstrating fair tolerance with completion of tasks today requiring modifications and cueing secondary to coordination impairments with R and dynamic standing balance. Will continue to challenge standing tolerance and coordination with completion of functional tasks.   PERFORMANCE DEFICITS: in functional skills including ADLs, IADLs, coordination, dexterity, strength, Fine motor control, Gross motor control, mobility, endurance, and UE functional use  IMPAIRMENTS: are limiting patient from ADLs, IADLs, work, and leisure.   CO-MORBIDITIES: may have co-morbidities  that affects occupational performance. Patient will benefit from skilled OT to address above impairments and improve overall function.  REHAB POTENTIAL: Good  PLAN:  OT FREQUENCY: 2x/week  OT DURATION: additional 4 weeks BEGINNING in October  PLANNED INTERVENTIONS: self care/ADL training, therapeutic exercise, therapeutic activity, neuromuscular re-education, manual therapy, functional mobility training, splinting, ultrasound, fluidotherapy, patient/family education, DME and/or AE instructions,  and Re-evaluation  RECOMMENDED OTHER SERVICES: Receiving PT and ST  CONSULTED AND AGREED WITH PLAN OF CARE: Patient and family member/caregiver  PLAN FOR NEXT SESSION: Repeat Blaze pods (standing); practice writing, environmental scanning; practice other fine motor tasks in standing  Delana Meyer, OTR/L 01/04/2023, 10:56 AM

## 2023-01-04 NOTE — Therapy (Signed)
OUTPATIENT PHYSICAL THERAPY NEURO TREATMENT   Patient Name: Joseph Bright MRN: [id number] DOB:Feb 18, 1976, 47 y.o., male Today's Date: 01/04/2023   PCP: Noberto Retort, MD REFERRING PROVIDER: Charlton Amor, PA-C      END OF SESSION:  PT End of Session - 01/04/23 0935     Visit Number 19    Number of Visits 23   recert   Date for PT Re-Evaluation 02/15/23   recert   Authorization Type [employer]    PT Start Time 0933    PT Stop Time 1015    PT Time Calculation (min) 42 min    Equipment Utilized During Treatment Gait belt    Activity Tolerance Patient tolerated treatment well    Behavior During Therapy WFL for tasks assessed/performed                             Past Medical History:  Diagnosis Date   Hypertension    Stroke [facility])    Past Surgical History:  Procedure Laterality Date   IR CT HEAD LTD  08/07/2022   IR INTRA CRAN STENT  08/07/2022   IR PERCUTANEOUS ART THROMBECTOMY/INFUSION INTRACRANIAL INC DIAG ANGIO  07/25/2022   LOOP RECORDER INSERTION N/A 07/28/2022   Procedure: LOOP RECORDER INSERTION;  Surgeon: Regan Lemming, MD;  Location: [facility] INVASIVE CV LAB;  Service: Cardiovascular;  Laterality: N/A;   RADIOLOGY WITH ANESTHESIA N/A 07/25/2022   Procedure: RADIOLOGY WITH ANESTHESIA;  Surgeon: Radiologist, Medication, MD;  Location: [facility] OR;  Service: Radiology;  Laterality: N/A;   TEE WITHOUT CARDIOVERSION N/A 07/28/2022   Procedure: TRANSESOPHAGEAL ECHOCARDIOGRAM;  Surgeon: Parke Poisson, MD;  Location: [facility] INVASIVE CV LAB;  Service: Cardiovascular;  Laterality: N/A;   Patient Active Problem List   Diagnosis Date Noted   Hemiparesis affecting right side as late effect of cerebrovascular accident (CVA) (HCC) 12/05/2022   Gait disturbance, post-stroke 12/05/2022   Loud snoring 10/18/2022   Adjustment disorder with mixed anxiety and depressed mood 08/07/2022   Cerebrovascular accident (CVA) due to occlusion of left posterior communicating  artery (HCC) 08/01/2022   Acute ischemic left ACA stroke (HCC) 07/25/2022   Arterial ischemic stroke, ACA (anterior cerebral artery), left, acute (HCC) 07/25/2022   DYSLIPIDEMIA 04/24/2007   Renal failure 11/29/2006   Essential hypertension 11/12/2006    ONSET DATE: 08/24/2022 (referral date)  REFERRING DIAG: I63.9 (ICD-10-CM) - CVA (cerebral vascular accident) (HCC)  THERAPY DIAG:  Muscle weakness (generalized)  Other lack of coordination  Other abnormalities of gait and mobility  Rationale for Evaluation and Treatment: Rehabilitation  SUBJECTIVE:  SUBJECTIVE STATEMENT:  Pt presents to clinic w/RW and backpack. Difficulty standing from chair w/backpack on. Denies acute changes or falls. Forgot to take his meds this morning    PERTINENT HISTORY:  hypertension, CKD stage IV followed by outpatient nephrology  PAIN:  Are you having pain? No  PRECAUTIONS: Fall and ICD/Pacemaker  WEIGHT BEARING RESTRICTIONS: No  FALLS: Has patient fallen in last 6 months? Yes. Number of falls 1, fell prior weekend at home trying to do something by himself, he bumped his head, his ex-wife helped him to get back up after the fall  LIVING ENVIRONMENT: Lives with:  lives with ex-wife Lives in: House/apartment Stairs: No Has following equipment at home: Environmental consultant - 2 wheeled, Wheelchair (manual), shower chair, and bed side commode  PLOF: Needs assistance with ADLs, Needs assistance with homemaking, Needs assistance with gait, and Needs assistance with transfers  PATIENT GOALS: "to walk"  OBJECTIVE:   DIAGNOSTIC FINDINGS:  Brain MRI 07/26/22 IMPRESSION: 1. Confluent Left ACA territory infarct. Cytotoxic edema with no hemorrhagic transformation.   2. Multiple additional scattered mostly punctate ischemic foci  in the left greater than right MCA, PCA, and occasionally cerebellar artery territories. No significant intracranial mass effect.   3. 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   4. See intracranial MRA is reported separately today.    COGNITION: Overall cognitive status: Within functional limits for tasks assessed   SENSATION: N/T in R hemibody, impaired sensation though light touch appears intact  EDEMA:  Swelling in RLE/R foot  POSTURE: rounded shoulders, forward head, and posterior pelvic tilt   LOWER EXTREMITY MMT:    MMT Right Eval Left Eval  Hip flexion 1 5  Hip extension    Hip abduction    Hip adduction    Hip internal rotation    Hip external rotation    Knee flexion 1 5  Knee extension 1 5  Ankle dorsiflexion 0 5  Ankle plantarflexion    Ankle inversion    Ankle eversion    (Blank rows = not tested)  VITALS  Vitals:   01/04/23 0954 01/04/23 1013  BP: (!) 135/93 (!) 132/90  Pulse: 73 72       TODAY'S TREATMENT:        Ther Act Assessed vitals (see above) and diastolic BP elevated but within limits for therapy   Ther Ex Noted increased vaulting on LLE this date, so assessed L ankle DF mobility and pt unable to obtain neutral ankle DF position. Performed standing wall calf stretch x2 minutes on LLE and added to HEP (See bolded below)  Performed passive ankle DF w/posterior talar glide x2 minutes to L ankle and able to obtain 0 degrees of DF w/knee extended. Added to HEP for [name] to perform (see bolded below)   NMR  At mat table, goblet squats x6 w/10# KB for improved BLE strength and weight shift to R side. Noted significant weight shift to L and genu valgus of RLE. Pt unaware of his body position, so obtained mirror to provide visual biofeedback on body position for remainder of session:  Performed 5 more reps of goblet squat in front of mirror and noted pt reduced his lateral weight shift to L side.  Continued genu valgus of RLE and noted bracing against back of mat w/LLE for stabilization  Added red theraband around distal quads for tactile cue for hip abduction. Provided min A at R knee to maintain proper knee position 3x6 reps w/o  KB. Pt reporting this variation is much harder for him to perform and noted decreased eccentric control when shifted equally to R side. Encouraged pt to practice this at home.   Son present at end of session to provide pt w/meds. Reassessed BP (see above) and still within limits for therapy.    Gait pattern: step through pattern, decreased stride length, decreased hip/knee flexion- Right, decreased ankle dorsiflexion- Right, Right hip hike, lateral hip instability, and lateral lean- Left Distance walked: Various clinic distances  Assistive device utilized: Walker - 2 wheeled Level of assistance: Modified independence Comments: Noted increased vaulting of LLE this date.     PATIENT EDUCATION:  Education details: updates to HEP, importance of stretching.  Person educated: Patient Education method: Explanation, Demonstration, and Handouts Education comprehension: verbalized understanding, returned demonstration, and needs further education  HOME EXERCISE PROGRAM: Access Code: [email or web address] URL: [email or web address] Date: 09/27/2022 Prepared by: Peter Congo  Exercises - Hooklying Heel Slide  - 1 x daily - 7 x weekly - 2 sets - 10-15 reps - Supine Bridge  - 1 x daily - 7 x weekly - 2-3 sets - 15 reps - Clamshell  - 1 x daily - 7 x weekly - 2 sets - 10 reps - Sidelying Reverse Clamshell  - 1 x daily - 7 x weekly - 2 sets - 10 reps - Long Sitting Hamstring Stretch  - 1 x daily - 7 x weekly - 1 sets - 3-5 reps - 30 sec hold - Staggered Sit-to-Stand  - 1 x daily - 7 x weekly - 3 sets - 5 reps  - Side-stepping at [place]  - Heel Raises with Counter Support  - 1 x daily - 7 x weekly - 3 sets - 10 reps - Alternating Step Taps with Counter  Support  - 1 x daily - 7 x weekly - 3 sets - 10 reps - Calf stretch  - 1 x daily - 7 x weekly - 3-4 sets - 30-60 second hold - Supine Ankle Dorsiflexion Stretch with Caregiver  - 1 x daily - 7 x weekly - 3 sets - 10 reps   GOALS: Goals reviewed with patient? Yes   NEW SHORT TERM GOALS:   Target date: 12/21/2022  Pt will improve 5 x STS to less than or equal to 18 seconds to demonstrate improved functional strength and transfer efficiency.  Baseline: 35.69 sec (7/1), 24.25 sec (7/29), 13.63 sec (8/29), 13 sec BUE/23 no UE (9/30) Goal status: MET  2.  Pt will improve normal TUG to less than or equal to 25 seconds for improved functional mobility and decreased fall risk. Baseline: 67 sec with RW (7/1), 39.5 sec with RW (7/29), 27.25 sec with RW (8/29), 21 sec with RW (9/30) Goal status: MET  3.  Pt will improve gait velocity to at least 1.3 ft/sec for improved gait efficiency and performance at mod I level  Baseline: 0.67 ft/sec with RW and CGA (7/1), 0.92 ft/sec with RW and CGA, 1.23 ft/sec with RW, 2.05 ft/sec with RW mod I (9/30) Goal status: MET  4.  Floor transfer to be assessed and LTG set Baseline: not assessed Goal status: MET  5.  Pt to trial various ADs (QC, SPC, etc) to determine if he can progress from current device of RW Baseline: mod I with RW, CGA to min A with 4-pronged "hurrycane", min A with SPC (9/30) Goal status: MET    NEW LONG TERM GOALS:  Target date: 01/18/2023   Pt  will improve 5 x STS to less than or equal to 18 seconds with no UE support to demonstrate improved functional strength and transfer efficiency.  Baseline: 35.69 sec (7/1), 24.25 sec (7/29), 13.63 sec (8/29), 13 sec BUE/23 no UE (9/30) Goal status: REVISED/UPGRADED  2.  Pt will improve normal TUG to less than or equal to 20 seconds for improved functional mobility and decreased fall risk. Baseline: 67 sec with RW (7/1), 39.5 sec with RW (7/29), 27.25 sec with RW (8/29), 21 sec with RW  (9/30) Goal status: IN PROGRESS  3.  Pt will improve gait velocity to at least 2.25 ft/sec for improved gait efficiency and performance at mod I level  Baseline: 0.67 ft/sec with RW and CGA (7/1), 0.92 ft/sec with RW and CGA, 1.23 ft/sec with RW, 2.05 ft/sec with RW mod I (9/30) Goal status: REVISED/UPGRADED  4.  Floor transfer to be assessed and LTG set Baseline: SBA-mod I at baseline  Goal status: DISCONTINUED   ASSESSMENT:  CLINICAL IMPRESSION:  Emphasis of skilled PT session on improved ankle mobility, weight shift to RLE and global strength. Noted increased vaulting this date and pt demonstrates decreased ankle DF ROM on L side. Added calf stretch exercises to HEP (one active and one passive) for pt to work on at home for improved ankle mobility. Pt demonstrates significant weight shift to L side w/sit to stands w/genu valgus of R knee. Pt has poor body awareness but did improve w/use of visual biofeedback. Pt requires min A to stabilize R knee during transfers and poor eccentric control if not bracing against back of mat table. Continue per POC.     OBJECTIVE IMPAIRMENTS: Abnormal gait, cardiopulmonary status limiting activity, decreased activity tolerance, decreased balance, decreased coordination, decreased endurance, decreased mobility, difficulty walking, decreased strength, increased edema, impaired sensation, and impaired UE functional use.   ACTIVITY LIMITATIONS: carrying, lifting, bending, standing, squatting, stairs, transfers, bed mobility, bathing, toileting, dressing, reach over head, and hygiene/grooming  PARTICIPATION LIMITATIONS: meal prep, cleaning, laundry, interpersonal relationship, driving, shopping, community activity, and occupation  PERSONAL FACTORS: 1-2 comorbidities:    hypertension, CKD stage IV followed outpatient nephrology are also affecting patient's functional outcome.   REHAB POTENTIAL: Good  CLINICAL DECISION MAKING:  Stable/uncomplicated  EVALUATION COMPLEXITY: Moderate  PLAN:  PT FREQUENCY: 2x/week  PT DURATION: 8 weeks  PLANNED INTERVENTIONS: Therapeutic exercises, Therapeutic activity, Neuromuscular re-education, Balance training, Gait training, Patient/Family education, Self Care, Joint mobilization, Stair training, Vestibular training, Canalith repositioning, Visual/preceptual remediation/compensation, Orthotic/Fit training, DME instructions, Aquatic Therapy, Dry Needling, Wheelchair mobility training, Cryotherapy, Moist heat, Taping, Manual therapy, and Re-evaluation  PLAN FOR NEXT SESSION:  review HEP, practice gait outdoors or across uneven/compliant surfaces and inclines with cane, STS w/ theraband for abduction, incline walking w/ cane, curb negotiation and stair negotiation w/ cane, R hip flexion/knee flexion strengthening, eccentric control, weight shift to R side    Jill Alexanders Corri Delapaz, PT, DPT [facility] [address] [place], [place]  [address] Phone:  [phone] Fax:  [phone]  01/04/23, 10:17 AM

## 2023-01-08 ENCOUNTER — Encounter: Payer: Self-pay | Admitting: Occupational Therapy

## 2023-01-08 ENCOUNTER — Ambulatory Visit: Payer: Managed Care, Other (non HMO) | Admitting: Physical Therapy

## 2023-01-08 ENCOUNTER — Ambulatory Visit: Payer: Managed Care, Other (non HMO) | Admitting: Occupational Therapy

## 2023-01-08 ENCOUNTER — Ambulatory Visit (HOSPITAL_COMMUNITY): Admission: RE | Admit: 2023-01-08 | Payer: Managed Care, Other (non HMO) | Source: Ambulatory Visit

## 2023-01-08 VITALS — BP 126/79 | HR 65

## 2023-01-08 DIAGNOSIS — R278 Other lack of coordination: Secondary | ICD-10-CM

## 2023-01-08 DIAGNOSIS — M6281 Muscle weakness (generalized): Secondary | ICD-10-CM | POA: Diagnosis not present

## 2023-01-08 DIAGNOSIS — R2689 Other abnormalities of gait and mobility: Secondary | ICD-10-CM

## 2023-01-08 DIAGNOSIS — R2681 Unsteadiness on feet: Secondary | ICD-10-CM

## 2023-01-08 NOTE — Therapy (Signed)
OUTPATIENT PHYSICAL THERAPY NEURO TREATMENT - 20TH VISIT PROGRESS NOTE AND DISCHARGE SUMMARY  Patient Name: Joseph Bright MRN: [id number] DOB:Aug 22, 1975, 47 y.o., male Today's Date: 01/08/2023   PCP: Noberto Retort, MD REFERRING PROVIDER: Charlton Amor, PA-C  Physical Therapy Progress Note   Dates of Reporting Period:09/25/22 - 01/08/23  See Note below for Objective Data and Assessment of Progress/Goals.  PHYSICAL THERAPY DISCHARGE SUMMARY  Visits from Start of Care: 20  Current functional level related to goals / functional outcomes: Ambulates Mod I w/RW and AFO for community distances, SBA-CGA w/hurrycane and AFO for short distances    Remaining deficits: R hemiplegia, decreased activity tolerance, moderate fall risk    Education / Equipment: HEP    Patient agrees to discharge. Patient goals were met. Patient is being discharged due to meeting the stated rehab goals.     END OF SESSION:  PT End of Session - 01/08/23 0934     Visit Number 20    Number of Visits 23   recert   Date for PT Re-Evaluation 02/15/23   recert   Authorization Type [employer]    PT Start Time 0932    PT Stop Time 1013    PT Time Calculation (min) 41 min    Equipment Utilized During Treatment Gait belt    Activity Tolerance Patient tolerated treatment well    Behavior During Therapy WFL for tasks assessed/performed                 Past Medical History:  Diagnosis Date   Hypertension    Stroke [facility])    Past Surgical History:  Procedure Laterality Date   IR CT HEAD LTD  08/07/2022   IR INTRA CRAN STENT  08/07/2022   IR PERCUTANEOUS ART THROMBECTOMY/INFUSION INTRACRANIAL INC DIAG ANGIO  07/25/2022   LOOP RECORDER INSERTION N/A 07/28/2022   Procedure: LOOP RECORDER INSERTION;  Surgeon: Regan Lemming, MD;  Location: [facility] INVASIVE CV LAB;  Service: Cardiovascular;  Laterality: N/A;   RADIOLOGY WITH ANESTHESIA N/A 07/25/2022   Procedure: RADIOLOGY WITH ANESTHESIA;  Surgeon:  Radiologist, Medication, MD;  Location: [facility] OR;  Service: Radiology;  Laterality: N/A;   TEE WITHOUT CARDIOVERSION N/A 07/28/2022   Procedure: TRANSESOPHAGEAL ECHOCARDIOGRAM;  Surgeon: Parke Poisson, MD;  Location: [facility] INVASIVE CV LAB;  Service: Cardiovascular;  Laterality: N/A;   Patient Active Problem List   Diagnosis Date Noted   Hemiparesis affecting right side as late effect of cerebrovascular accident (CVA) (HCC) 12/05/2022   Gait disturbance, post-stroke 12/05/2022   Loud snoring 10/18/2022   Adjustment disorder with mixed anxiety and depressed mood 08/07/2022   Cerebrovascular accident (CVA) due to occlusion of left posterior communicating artery (HCC) 08/01/2022   Acute ischemic left ACA stroke (HCC) 07/25/2022   Arterial ischemic stroke, ACA (anterior cerebral artery), left, acute (HCC) 07/25/2022   DYSLIPIDEMIA 04/24/2007   Renal failure 11/29/2006   Essential hypertension 11/12/2006    ONSET DATE: 08/24/2022 (referral date)  REFERRING DIAG: I63.9 (ICD-10-CM) - CVA (cerebral vascular accident) (HCC)  THERAPY DIAG:  Other abnormalities of gait and mobility  Unsteadiness on feet  Muscle weakness (generalized)  Other lack of coordination  Rationale for Evaluation and Treatment: Rehabilitation  SUBJECTIVE:  SUBJECTIVE STATEMENT:  Pt presents to clinic w/RW and backpack. Reports he took his meds this morning. Denies acute change since last session. Is ready to DC today as he would like to conserve some therapy visits.    PERTINENT HISTORY:  hypertension, CKD stage IV followed by outpatient nephrology  PAIN:  Are you having pain? No  PRECAUTIONS: Fall and ICD/Pacemaker  WEIGHT BEARING RESTRICTIONS: No  FALLS: Has patient fallen in last 6 months? Yes. Number of falls 1, fell  prior weekend at home trying to do something by himself, he bumped his head, his ex-wife helped him to get back up after the fall  LIVING ENVIRONMENT: Lives with:  lives with ex-wife Lives in: House/apartment Stairs: No Has following equipment at home: Environmental consultant - 2 wheeled, Wheelchair (manual), shower chair, and bed side commode  PLOF: Needs assistance with ADLs, Needs assistance with homemaking, Needs assistance with gait, and Needs assistance with transfers  PATIENT GOALS: "to walk"  OBJECTIVE:   DIAGNOSTIC FINDINGS:  Brain MRI 07/26/22 IMPRESSION: 1. Confluent Left ACA territory infarct. Cytotoxic edema with no hemorrhagic transformation.   2. Multiple additional scattered mostly punctate ischemic foci in the left greater than right MCA, PCA, and occasionally cerebellar artery territories. No significant intracranial mass effect.   3. 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   4. See intracranial MRA is reported separately today.    COGNITION: Overall cognitive status: Within functional limits for tasks assessed   SENSATION: N/T in R hemibody, impaired sensation though light touch appears intact  EDEMA:  Swelling in RLE/R foot  POSTURE: rounded shoulders, forward head, and posterior pelvic tilt   LOWER EXTREMITY MMT:    MMT Right Eval Left Eval  Hip flexion 1 5  Hip extension    Hip abduction    Hip adduction    Hip internal rotation    Hip external rotation    Knee flexion 1 5  Knee extension 1 5  Ankle dorsiflexion 0 5  Ankle plantarflexion    Ankle inversion    Ankle eversion    (Blank rows = not tested)  VITALS  Vitals:   01/08/23 0937  BP: 126/79  Pulse: 65        TODAY'S TREATMENT:        Ther Act Assessed vitals (see above) and within limits for therapy   LTG Assessment   [facility] PT Assessment - 01/08/23 0948       Transfers   Five time sit to stand comments  15.13s no UE support   13.97s  w/BUE support     Ambulation/Gait   Gait velocity 32.8' over 12.15s = 2.7 ft/s w/RW   AFO donned     Timed Up and Go Test   TUG Normal TUG    Normal TUG (seconds) 18.79   w/RW             Gait Training  The following treadmill training was completed for aerobic/neural priming, endurance, step clearance and gait speed. SBA and BUE support throughout  - Round 1 3:10 up to 0.7 mph. Increased incline to 2% at 90s. Short standing rest break taken in between rounds  - Round 2: 3:00 up to 0.71mph at 2% incline - Noted steppage gait pattern of LLE w/fatigue and toe walking on L foot due to decreased ankle DF ROM.     Gait pattern: step through pattern, decreased stride length, decreased hip/knee flexion- Right, decreased ankle dorsiflexion- Right, Right hip hike,  lateral hip instability, and lateral lean- Left Distance walked: Various clinic distances  Assistive device utilized: Walker - 2 wheeled Level of assistance: Modified independence Comments: Pt continues to imitate vaulting pattern on LLE due to limitations in ankle DF.    PATIENT EDUCATION:  Education details: Continue HEP, goal outcomes, encouragement to use RW to go to work and in community. How to obtain new PT referral in future if mobility needs change. Remaining visits left for the year.  Person educated: Patient Education method: Explanation Education comprehension: verbalized understanding  HOME EXERCISE PROGRAM: Access Code: [email or web address] URL: [email or web address] Date: 09/27/2022 Prepared by: Peter Congo  Exercises - Hooklying Heel Slide  - 1 x daily - 7 x weekly - 2 sets - 10-15 reps - Supine Bridge  - 1 x daily - 7 x weekly - 2-3 sets - 15 reps - Clamshell  - 1 x daily - 7 x weekly - 2 sets - 10 reps - Sidelying Reverse Clamshell  - 1 x daily - 7 x weekly - 2 sets - 10 reps - Long Sitting Hamstring Stretch  - 1 x daily - 7 x weekly - 1 sets - 3-5 reps - 30 sec hold - Staggered Sit-to-Stand  -  1 x daily - 7 x weekly - 3 sets - 5 reps  - Side-stepping at [place]  - Heel Raises with Counter Support  - 1 x daily - 7 x weekly - 3 sets - 10 reps - Alternating Step Taps with Counter Support  - 1 x daily - 7 x weekly - 3 sets - 10 reps - Calf stretch  - 1 x daily - 7 x weekly - 3-4 sets - 30-60 second hold - Supine Ankle Dorsiflexion Stretch with Caregiver  - 1 x daily - 7 x weekly - 3 sets - 10 reps   GOALS: Goals reviewed with patient? Yes   NEW SHORT TERM GOALS:   Target date: 12/21/2022  Pt will improve 5 x STS to less than or equal to 18 seconds to demonstrate improved functional strength and transfer efficiency.  Baseline: 35.69 sec (7/1), 24.25 sec (7/29), 13.63 sec (8/29), 13 sec BUE/23 no UE (9/30) Goal status: MET  2.  Pt will improve normal TUG to less than or equal to 25 seconds for improved functional mobility and decreased fall risk. Baseline: 67 sec with RW (7/1), 39.5 sec with RW (7/29), 27.25 sec with RW (8/29), 21 sec with RW (9/30) Goal status: MET  3.  Pt will improve gait velocity to at least 1.3 ft/sec for improved gait efficiency and performance at mod I level  Baseline: 0.67 ft/sec with RW and CGA (7/1), 0.92 ft/sec with RW and CGA, 1.23 ft/sec with RW, 2.05 ft/sec with RW mod I (9/30) Goal status: MET  4.  Floor transfer to be assessed and LTG set Baseline: not assessed Goal status: MET  5.  Pt to trial various ADs (QC, SPC, etc) to determine if he can progress from current device of RW Baseline: mod I with RW, CGA to min A with 4-pronged "hurrycane", min A with SPC (9/30) Goal status: MET    NEW LONG TERM GOALS:  Target date: 01/18/2023   Pt will improve 5 x STS to less than or equal to 18 seconds with no UE support to demonstrate improved functional strength and transfer efficiency.  Baseline: 35.69 sec (7/1), 24.25 sec (7/29), 13.63 sec (8/29), 13 sec BUE/23 no UE (9/30); 13.97s w/BUE, 15.13s no UE (10/14) Goal status:  MET  2.  Pt will  improve normal TUG to less than or equal to 20 seconds for improved functional mobility and decreased fall risk. Baseline: 67 sec with RW (7/1), 39.5 sec with RW (7/29), 27.25 sec with RW (8/29), 21 sec with RW (9/30); 18.79s w/RW (10/14)  Goal status: MET  3.  Pt will improve gait velocity to at least 2.25 ft/sec for improved gait efficiency and performance at mod I level  Baseline: 0.67 ft/sec with RW and CGA (7/1), 0.92 ft/sec with RW and CGA, 1.23 ft/sec with RW, 2.05 ft/sec with RW mod I (9/30); 2.7 ft/s w/RW mod I (10/14)  Goal status: MET  4.  Floor transfer to be assessed and LTG set Baseline: SBA-mod I at baseline  Goal status: DISCONTINUED   ASSESSMENT:  CLINICAL IMPRESSION:  Emphasis of skilled PT session on LTG assessment, gait training and DC from PT. Pt has met all 3 LTGs, with his 4th goal being discontinued several weeks ago due to high baseline score. Pt has significantly improved his gait speed w/RW, up to 2.7 ft/s compared to 2.05 ft/s a few weeks ago. Pt also improved his time on 5x STS w/o UE support to goal level but did require cues for anterior weight shift, as he has tendency to brace his LLE against back of chair. Pt also improved his time on TUG to <20s w/RW and no instability. Pt reports he would like to DC from PT to save some therapy visits in case he needs them later in year. Pt continues to be a moderate fall risk but is mod I w/RW and AFO. Pt most limited by decreased ankle DF ROM on LLE, resulting in vault-like gait pattern on LLE. Pt reports he is working on ankle mobility at home. Pt in agreement to DC this date and is anticipating returning to work at end of the month.      OBJECTIVE IMPAIRMENTS: Abnormal gait, cardiopulmonary status limiting activity, decreased activity tolerance, decreased balance, decreased coordination, decreased endurance, decreased mobility, difficulty walking, decreased strength, increased edema, impaired sensation, and impaired UE  functional use.   ACTIVITY LIMITATIONS: carrying, lifting, bending, standing, squatting, stairs, transfers, bed mobility, bathing, toileting, dressing, reach over head, and hygiene/grooming  PARTICIPATION LIMITATIONS: meal prep, cleaning, laundry, interpersonal relationship, driving, shopping, community activity, and occupation  PERSONAL FACTORS: 1-2 comorbidities:    hypertension, CKD stage IV followed outpatient nephrology are also affecting patient's functional outcome.   REHAB POTENTIAL: Good  CLINICAL DECISION MAKING: Stable/uncomplicated  EVALUATION COMPLEXITY: Moderate  PLAN:  PT FREQUENCY: 2x/week  PT DURATION: 8 weeks  PLANNED INTERVENTIONS: Therapeutic exercises, Therapeutic activity, Neuromuscular re-education, Balance training, Gait training, Patient/Family education, Self Care, Joint mobilization, Stair training, Vestibular training, Canalith repositioning, Visual/preceptual remediation/compensation, Orthotic/Fit training, DME instructions, Aquatic Therapy, Dry Needling, Wheelchair mobility training, Cryotherapy, Moist heat, Taping, Manual therapy, and Re-evaluation   Jill Alexanders Jaelin Fackler, PT, DPT [facility] [address] [place], [place]  [address] Phone:  [phone] Fax:  [phone]  01/08/23, 10:19 AM

## 2023-01-08 NOTE — Therapy (Signed)
OUTPATIENT OCCUPATIONAL THERAPY NEURO TREATMENT  Patient Name: Joseph Bright MRN: [id number] DOB:[birth year], 47 y.o., male Today's Date: 01/08/2023  PCP: Noberto Retort, MD  REFERRING PROVIDER: Charlton Amor, PA-C   END OF SESSION:  OT End of Session - 01/08/23 1020     Visit Number 19    Number of Visits 22    Date for OT Re-Evaluation 01/26/23    Authorization Type [employer] (60 VL - OT, PT, ST)    OT Start Time 1018    OT Stop Time 1100    OT Time Calculation (min) 42 min    Equipment Utilized During Treatment gait belt    Activity Tolerance Patient tolerated treatment well    Behavior During Therapy WFL for tasks assessed/performed            Past Medical History:  Diagnosis Date   Hypertension    Stroke [facility])    Past Surgical History:  Procedure Laterality Date   IR CT HEAD LTD  08/07/2022   IR INTRA CRAN STENT  08/07/2022   IR PERCUTANEOUS ART THROMBECTOMY/INFUSION INTRACRANIAL INC DIAG ANGIO  07/25/2022   LOOP RECORDER INSERTION N/A 07/28/2022   Procedure: LOOP RECORDER INSERTION;  Surgeon: Regan Lemming, MD;  Location: [facility] INVASIVE CV LAB;  Service: Cardiovascular;  Laterality: N/A;   RADIOLOGY WITH ANESTHESIA N/A 07/25/2022   Procedure: RADIOLOGY WITH ANESTHESIA;  Surgeon: Radiologist, Medication, MD;  Location: [facility] OR;  Service: Radiology;  Laterality: N/A;   TEE WITHOUT CARDIOVERSION N/A 07/28/2022   Procedure: TRANSESOPHAGEAL ECHOCARDIOGRAM;  Surgeon: Parke Poisson, MD;  Location: [facility] INVASIVE CV LAB;  Service: Cardiovascular;  Laterality: N/A;   Patient Active Problem List   Diagnosis Date Noted   Hemiparesis affecting right side as late effect of cerebrovascular accident (CVA) (HCC) 12/05/2022   Gait disturbance, post-stroke 12/05/2022   Loud snoring 10/18/2022   Adjustment disorder with mixed anxiety and depressed mood 08/07/2022   Cerebrovascular accident (CVA) due to occlusion of left posterior communicating artery (HCC) 08/01/2022   Acute  ischemic left ACA stroke (HCC) 07/25/2022   Arterial ischemic stroke, ACA (anterior cerebral artery), left, acute (HCC) 07/25/2022   DYSLIPIDEMIA 04/24/2007   Renal failure 11/29/2006   Essential hypertension 11/12/2006    ONSET DATE: 07/25/2022  REFERRING DIAG: I61.9 (ICD-10-CM) - CVA (cerebrovascular accident due to intracerebral hemorrhage)   THERAPY DIAG:  Other lack of coordination  Muscle weakness (generalized)  Unsteadiness on feet  Rationale for Evaluation and Treatment: Rehabilitation  SUBJECTIVE:   SUBJECTIVE STATEMENT: No pain and no falls I'm going back to work part-time with adaptations put in place.   Pt accompanied by: Self  PERTINENT HISTORY: Pt presented to ED 07/25/2022. "Cranial CT scan negative for acute changes. There was chronic appearing right thalamic lacunar infarct. 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 Status post TNK with interventional radiology for stenting. MRI follow-up showed confluent left ACA territory infarction. Cytotoxic edema with no hemorrhagic transformation. Multiple additional scattered mostly punctate ischemic foci in the left greater than right MCA, PCA and occasionally cerebellar artery territories."  Pt in IRF from 08/01/2022 -08/31/2022 then discharged to OP OT, PT, and ST.   PRECAUTIONS: Fall  WEIGHT BEARING RESTRICTIONS: No  PAIN:  Are you having pain? No  FALLS: Has patient fallen in last 6 months? No  LIVING ENVIRONMENT: Lives with: lives with their family Lives in: House/apartment Stairs: No Has following equipment at home: Dan Humphreys - 2 wheeled, Environmental consultant - 4  wheeled, Wheelchair (manual), bed side commode, and Grab bars  PLOF: Independent; full-time working for [employer]; driving; sports (football and basketball)  PATIENT GOALS: return to [employer]  OBJECTIVE:   HAND DOMINANCE: Right  ADLs: Overall ADLs: min A  IADLs: Shopping: dependent Light housekeeping:  dependent Community mobility: dependent  Handwriting: Not legible  MOBILITY STATUS: Needs Assist: manual wheelchair  ACTIVITY TOLERANCE: Activity tolerance: fair  FUNCTIONAL OUTCOME MEASURES: FOTO: Slight: 59 10/23/2022: 79   UPPER EXTREMITY ROM:     AROM Right (eval) Left (eval)  Shoulder flexion WNL WNL  Shoulder abduction WNL WNL  Elbow flexion WNL WNL  Elbow extension WNL WNL  Wrist flexion WNL WNL  Wrist extension WNL WNL  Wrist pronation WNL WNL  Wrist supination WNL WNL   Digit Composite Flexion WNL WNL  Digit Composite Extension WNL WNL  Digit Opposition WNL WNL  (Blank rows = not tested)  RUE: IR to hip; overall bradykinesia with tremor  UPPER EXTREMITY MMT:     MMT Right (eval) Left (eval)  Shoulder flexion 4/5 WNL  Shoulder abduction 4/5 WNL  Elbow flexion 4/5 WNL  Elbow extension 4/5 WNL  (Blank rows = not tested)  HAND FUNCTION: Grip strength: Right: 38.8 lbs; Left: 82.8 lbs 10/23/2022: 57.5 lbs  COORDINATION: 9 Hole Peg test: Right: 139 sec; Left: 32 sec 10/23/2022: 59 seconds  SENSATION: Paresthesias reported in R fingertips  EDEMA: none reported or observed  MUSCLE TONE: WFL  COGNITION: Overall cognitive status: Within functional limits for tasks assessed  VISION: Subjective report: no changes Baseline vision: Wears glasses for reading only Visual history:  none  VISION ASSESSMENT: WFL  PERCEPTION: WFL  PRAXIS: WFL  OBSERVATIONS: Pt appears well-kept. He is able to propel w/c with increased time. Has RW with him.   TODAY'S TREATMENT:     Practiced writing name x 3 in print with regular pen with 90% legibility, then address x 2 in print with 75% legibility  Environmental Scanning with 12/12 accuracy = 100% on first pass. Unable to work on dual tasking with physical task d/t requiring walker for balance.   Pt picking up 5# weight from floor with LUE, then RUE with one hand countertop support for balance.   OT placed 5  BlazePods in front of patient and had patient tap pods with i isolated index and middle fingers RUE as pods lit up using distraction and scanning mode for improved processing, scanning and locating of items, reaction time, upper extremity range of motion, gross motor coordination, and fine motor coordination. Pt had difficulty incorporating lateral stepping into task and for hitting lower target and applying enough force as needed to register some of his taps requiring cues and modifications. Hits ranged from 100 in 4 minute durations with average reaction 2,287, 1 strike. Pt used bar for balance for 1/2 amount of time holding on with Lt hand  PATIENT EDUCATION: Education details: coordination; standing balance and safety Person educated: Patient Education method: Explanation, Demonstration, and Verbal cues Education comprehension: verbalized understanding, returned demonstration, and verbal cues required  HOME EXERCISE PROGRAM: 09/12/2022: Chilton Si putty and coordination HEPs 09/27/22: Additional coordination HEP  10/04/22: shoulder girdle/scapula strengthening HEP  10/23/2022: Shoulder slides and alphabet 12/25/2022:  Updated Red theraband HEP--see pt instructions  GOALS:  SHORT TERM GOALS: Target date: 10/10/2022    Patient will demonstrate independence with initial RUE HEP. Baseline: Goal status: MET  2.  Patient will demonstrate at least 45 lbs R grip strength as needed to open jars  and other containers. Baseline: 38.8 lbs 10/23/2022: 57.5 lbs Goal status: MET  3.  Pt will report no difficulty holding a dish while washing it.  Baseline: with some difficulty 10/23/2022: has not tried recently; simulated in clinic: little difficulty Goal status: MET   LONG TERM GOALS: Target date: 12/26/22 - 01/26/23    Patient will demonstrate updated RUE HEP with 25% verbal cues or less for proper execution. Baseline:  Goal status: MET  2.  Patient will demonstrate at least 50 lbs R grip strength as  needed to open jars and other containers. Baseline: 38.8 lbs 10/23/2022: 57.5 lbs Goal status: MET  3.  Patient will complete nine-hole peg with use of R in 45 seconds or less. Baseline: 139 seconds 10/23/2022: 59 seconds; GOAL UPDATED (< or = 60 seconds originally) Goal status: REVISED  4.  Pt will complete d/c FOTO.  Baseline: 59 10/23/2022: 79 Goal status: IN PROGRESS  5.  Pt will type at 18 wpm maintaining 90% or greater accuracy  Baseline: 10 wpm, 91% accuracy Goal status: in progress (01/01/23 - 12 wpm, 94% accuracy)  6.  Pt will write paragraph maintaining 100% legibility in print Baseline:  Goal status: INITIAL    7.  Pt will perform environmental scanning with simple physical or cognitive task at 90% accuracy in prep for return to driving Baseline:  16/03/958: 29/30 with word cancellation Goal status: IN PROGRESS   8.  Pt will be able to pick up items from floor up to 10 # with countertop support for balance Baseline:  Goal status: INITIAL  9.  Pt will demo kitchen tasks (cooking) from w/c level prn and safety to stand at counter to get items from higher shelves Baseline:  Goal status: MET (per pt report)      ASSESSMENT:  CLINICAL IMPRESSION: Pt demonstrating fair tolerance with completion of tasks today requiring modifications and cueing secondary to coordination impairments with R and dynamic standing balance. Will continue to challenge standing tolerance and coordination with completion of functional tasks.   PERFORMANCE DEFICITS: in functional skills including ADLs, IADLs, coordination, dexterity, strength, Fine motor control, Gross motor control, mobility, endurance, and UE functional use  IMPAIRMENTS: are limiting patient from ADLs, IADLs, work, and leisure.   CO-MORBIDITIES: may have co-morbidities  that affects occupational performance. Patient will benefit from skilled OT to address above impairments and improve overall function.  REHAB POTENTIAL:  Good  PLAN:  OT FREQUENCY: 2x/week  OT DURATION: additional 4 weeks BEGINNING in October  PLANNED INTERVENTIONS: self care/ADL training, therapeutic exercise, therapeutic activity, neuromuscular re-education, manual therapy, functional mobility training, splinting, ultrasound, fluidotherapy, patient/family education, DME and/or AE instructions, and Re-evaluation  RECOMMENDED OTHER SERVICES: Receiving PT and ST  CONSULTED AND AGREED WITH PLAN OF CARE: Patient and family member/caregiver  PLAN FOR NEXT SESSION: practice other fine motor tasks in standing  [place], OTR/L 01/08/2023, 10:21 AM

## 2023-01-11 ENCOUNTER — Encounter: Payer: Self-pay | Admitting: Occupational Therapy

## 2023-01-11 ENCOUNTER — Ambulatory Visit: Payer: Managed Care, Other (non HMO) | Admitting: Physical Therapy

## 2023-01-11 ENCOUNTER — Ambulatory Visit: Payer: Managed Care, Other (non HMO) | Admitting: Occupational Therapy

## 2023-01-11 DIAGNOSIS — M6281 Muscle weakness (generalized): Secondary | ICD-10-CM | POA: Diagnosis not present

## 2023-01-11 DIAGNOSIS — R278 Other lack of coordination: Secondary | ICD-10-CM

## 2023-01-11 DIAGNOSIS — R2681 Unsteadiness on feet: Secondary | ICD-10-CM

## 2023-01-11 NOTE — Therapy (Signed)
OUTPATIENT OCCUPATIONAL THERAPY NEURO TREATMENT  Patient Name: Joseph Bright MRN: [id number] DOB:Mar 02, 1976, 47 y.o., male Today's Date: 01/11/2023  PCP: Noberto Retort, MD  REFERRING PROVIDER: Charlton Amor, PA-C   END OF SESSION:  OT End of Session - 01/11/23 1019     Visit Number 20    Number of Visits 22    Date for OT Re-Evaluation 01/26/23    Authorization Type [employer] (60 VL - OT, PT, ST)    OT Start Time 1017    OT Stop Time 1100    OT Time Calculation (min) 43 min    Equipment Utilized During Treatment gait belt    Activity Tolerance Patient tolerated treatment well    Behavior During Therapy WFL for tasks assessed/performed            Past Medical History:  Diagnosis Date   Hypertension    Stroke [facility])    Past Surgical History:  Procedure Laterality Date   IR CT HEAD LTD  08/07/2022   IR INTRA CRAN STENT  08/07/2022   IR PERCUTANEOUS ART THROMBECTOMY/INFUSION INTRACRANIAL INC DIAG ANGIO  07/25/2022   LOOP RECORDER INSERTION N/A 07/28/2022   Procedure: LOOP RECORDER INSERTION;  Surgeon: Regan Lemming, MD;  Location: [facility] INVASIVE CV LAB;  Service: Cardiovascular;  Laterality: N/A;   RADIOLOGY WITH ANESTHESIA N/A 07/25/2022   Procedure: RADIOLOGY WITH ANESTHESIA;  Surgeon: Radiologist, Medication, MD;  Location: [facility] OR;  Service: Radiology;  Laterality: N/A;   TEE WITHOUT CARDIOVERSION N/A 07/28/2022   Procedure: TRANSESOPHAGEAL ECHOCARDIOGRAM;  Surgeon: Parke Poisson, MD;  Location: [facility] INVASIVE CV LAB;  Service: Cardiovascular;  Laterality: N/A;   Patient Active Problem List   Diagnosis Date Noted   Hemiparesis affecting right side as late effect of cerebrovascular accident (CVA) (HCC) 12/05/2022   Gait disturbance, post-stroke 12/05/2022   Loud snoring 10/18/2022   Adjustment disorder with mixed anxiety and depressed mood 08/07/2022   Cerebrovascular accident (CVA) due to occlusion of left posterior communicating artery (HCC) 08/01/2022   Acute  ischemic left ACA stroke (HCC) 07/25/2022   Arterial ischemic stroke, ACA (anterior cerebral artery), left, acute (HCC) 07/25/2022   DYSLIPIDEMIA 04/24/2007   Renal failure 11/29/2006   Essential hypertension 11/12/2006    ONSET DATE: 07/25/2022  REFERRING DIAG: I61.9 (ICD-10-CM) - CVA (cerebrovascular accident due to intracerebral hemorrhage)   THERAPY DIAG:  Unsteadiness on feet  Other lack of coordination  Rationale for Evaluation and Treatment: Rehabilitation  SUBJECTIVE:   SUBJECTIVE STATEMENT: No pain and no falls I'm going back to work part-time with adaptations put in place.   Pt accompanied by: Self  PERTINENT HISTORY: Pt presented to ED 07/25/2022. "Cranial CT scan negative for acute changes. There was chronic appearing right thalamic lacunar infarct. 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 Status post TNK with interventional radiology for stenting. MRI follow-up showed confluent left ACA territory infarction. Cytotoxic edema with no hemorrhagic transformation. Multiple additional scattered mostly punctate ischemic foci in the left greater than right MCA, PCA and occasionally cerebellar artery territories."  Pt in IRF from 08/01/2022 -08/31/2022 then discharged to OP OT, PT, and ST.   PRECAUTIONS: Fall  WEIGHT BEARING RESTRICTIONS: No  PAIN:  Are you having pain? No  FALLS: Has patient fallen in last 6 months? No  LIVING ENVIRONMENT: Lives with: lives with their family Lives in: House/apartment Stairs: No Has following equipment at home: Dan Humphreys - 2 wheeled, Environmental consultant - 4 wheeled, Wheelchair (manual), bed  side commode, and Grab bars  PLOF: Independent; full-time working for [employer]; driving; sports (football and basketball)  PATIENT GOALS: return to PLOF  OBJECTIVE:   HAND DOMINANCE: Right  ADLs: Overall ADLs: min A  IADLs: Shopping: dependent Light housekeeping: dependent Community mobility:  dependent  Handwriting: Not legible  MOBILITY STATUS: Needs Assist: manual wheelchair  ACTIVITY TOLERANCE: Activity tolerance: fair  FUNCTIONAL OUTCOME MEASURES: FOTO: Slight: 59 10/23/2022: 79   UPPER EXTREMITY ROM:     AROM Right (eval) Left (eval)  Shoulder flexion WNL WNL  Shoulder abduction WNL WNL  Elbow flexion WNL WNL  Elbow extension WNL WNL  Wrist flexion WNL WNL  Wrist extension WNL WNL  Wrist pronation WNL WNL  Wrist supination WNL WNL   Digit Composite Flexion WNL WNL  Digit Composite Extension WNL WNL  Digit Opposition WNL WNL  (Blank rows = not tested)  RUE: IR to hip; overall bradykinesia with tremor  UPPER EXTREMITY MMT:     MMT Right (eval) Left (eval)  Shoulder flexion 4/5 WNL  Shoulder abduction 4/5 WNL  Elbow flexion 4/5 WNL  Elbow extension 4/5 WNL  (Blank rows = not tested)  HAND FUNCTION: Grip strength: Right: 38.8 lbs; Left: 82.8 lbs 10/23/2022: 57.5 lbs  COORDINATION: 9 Hole Peg test: Right: 139 sec; Left: 32 sec 10/23/2022: 59 seconds  SENSATION: Paresthesias reported in R fingertips  EDEMA: none reported or observed  MUSCLE TONE: WFL  COGNITION: Overall cognitive status: Within functional limits for tasks assessed  VISION: Subjective report: no changes Baseline vision: Wears glasses for reading only Visual history:  none  VISION ASSESSMENT: WFL  PERCEPTION: WFL  PRAXIS: WFL  OBSERVATIONS: Pt appears well-kept. He is able to propel w/c with increased time. Has RW with him.   TODAY'S TREATMENT:      Standing to complete fine motor task - copying medium sized pegs onto pegboard following design for: balance, visual scanning and coordination RUE.   Standing and walking along counter side stepping w/ 2 hand support and forward walking with 1 hand support. Followed by functional task - retrieving items from lower and higher surfaces w/ one hand countertop support for balance  UBE x 8 min, level 3.5 for normal  reciprocal movement pattern and UB conditioning - pt did backwards d/t compensations at shoulder and wrist going forwards  Typing games on computer including type toss and keyboard jump (easy level, easy words) - pt also had trouble using mouse Rt hand for control and targeted accuracy   PATIENT EDUCATION: Education details: coordination; standing balance and safety Person educated: Patient Education method: Explanation, Demonstration, and Verbal cues Education comprehension: verbalized understanding, returned demonstration, and verbal cues required  HOME EXERCISE PROGRAM: 09/12/2022: Chilton Si putty and coordination HEPs 09/27/22: Additional coordination HEP  10/04/22: shoulder girdle/scapula strengthening HEP  10/23/2022: Shoulder slides and alphabet 12/25/2022:  Updated Red theraband HEP--see pt instructions  GOALS:  SHORT TERM GOALS: Target date: 10/10/2022    Patient will demonstrate independence with initial RUE HEP. Baseline: Goal status: MET  2.  Patient will demonstrate at least 45 lbs R grip strength as needed to open jars and other containers. Baseline: 38.8 lbs 10/23/2022: 57.5 lbs Goal status: MET  3.  Pt will report no difficulty holding a dish while washing it.  Baseline: with some difficulty 10/23/2022: has not tried recently; simulated in clinic: little difficulty Goal status: MET   LONG TERM GOALS: Target date: 12/26/22 - 01/26/23    Patient will demonstrate updated RUE HEP with  25% verbal cues or less for proper execution. Baseline:  Goal status: MET  2.  Patient will demonstrate at least 50 lbs R grip strength as needed to open jars and other containers. Baseline: 38.8 lbs 10/23/2022: 57.5 lbs Goal status: MET  3.  Patient will complete nine-hole peg with use of R in 45 seconds or less. Baseline: 139 seconds 10/23/2022: 59 seconds; GOAL UPDATED (< or = 60 seconds originally) Goal status: REVISED  4.  Pt will complete d/c FOTO.  Baseline: 59 10/23/2022:  79 Goal status: IN PROGRESS  5.  Pt will type at 18 wpm maintaining 90% or greater accuracy  Baseline: 10 wpm, 91% accuracy Goal status: in progress (01/01/23 - 12 wpm, 94% accuracy)  6.  Pt will write paragraph maintaining 100% legibility in print Baseline:  Goal status: INITIAL    7.  Pt will perform environmental scanning with simple physical or cognitive task at 90% accuracy in prep for return to driving Baseline:  16/03/958: 29/30 with word cancellation Goal status: IN PROGRESS   8.  Pt will be able to pick up items from floor up to 10 # with countertop support for balance Baseline:  Goal status: INITIAL  9.  Pt will demo kitchen tasks (cooking) from w/c level prn and safety to stand at counter to get items from higher shelves Baseline:  Goal status: MET (per pt report)      ASSESSMENT:  CLINICAL IMPRESSION: Pt demonstrating fair tolerance with completion of tasks today requiring modifications and cueing secondary to coordination impairments with R and dynamic standing balance. Will continue to challenge standing tolerance and coordination with completion of functional tasks.   PERFORMANCE DEFICITS: in functional skills including ADLs, IADLs, coordination, dexterity, strength, Fine motor control, Gross motor control, mobility, endurance, and UE functional use  IMPAIRMENTS: are limiting patient from ADLs, IADLs, work, and leisure.   CO-MORBIDITIES: may have co-morbidities  that affects occupational performance. Patient will benefit from skilled OT to address above impairments and improve overall function.  REHAB POTENTIAL: Good  PLAN:  OT FREQUENCY: 2x/week  OT DURATION: additional 4 weeks BEGINNING in October  PLANNED INTERVENTIONS: self care/ADL training, therapeutic exercise, therapeutic activity, neuromuscular re-education, manual therapy, functional mobility training, splinting, ultrasound, fluidotherapy, patient/family education, DME and/or AE instructions, and  Re-evaluation  RECOMMENDED OTHER SERVICES: Receiving PT and ST  CONSULTED AND AGREED WITH PLAN OF CARE: Patient and family member/caregiver  PLAN FOR NEXT SESSION: progress towards remaining goals as able - anticipate d/c next week.   Sheran Lawless, OTR/L 01/11/2023, 10:20 AM

## 2023-01-15 ENCOUNTER — Ambulatory Visit: Payer: Managed Care, Other (non HMO) | Admitting: Physical Therapy

## 2023-01-15 ENCOUNTER — Ambulatory Visit: Payer: Managed Care, Other (non HMO) | Admitting: Occupational Therapy

## 2023-01-15 ENCOUNTER — Encounter: Payer: Self-pay | Admitting: Occupational Therapy

## 2023-01-15 DIAGNOSIS — M6281 Muscle weakness (generalized): Secondary | ICD-10-CM

## 2023-01-15 DIAGNOSIS — R2681 Unsteadiness on feet: Secondary | ICD-10-CM

## 2023-01-15 DIAGNOSIS — R278 Other lack of coordination: Secondary | ICD-10-CM

## 2023-01-15 DIAGNOSIS — R208 Other disturbances of skin sensation: Secondary | ICD-10-CM

## 2023-01-15 NOTE — Patient Instructions (Signed)
Local Driver Evaluation Programs: ° °Comprehensive Evaluation: includes clinical and in vehicle behind the wheel testing by OCCUPATIONAL THERAPIST. Programs have varying levels of adaptive controls available for trial.  ° °[facility] °[address] °[place], [place]  [address] °[phone] or [phone] °http://www.driver-rehab.com °Evaluator:  Cyndee Crompton, OT/CDRS/CDI/SCDCM/Low Vision Certification ° °[facility] °[address] °[place], [place] [address] °[phone] °https://www.[facility].org/home/services/rehabilitation.aspx °Evaluators:  Shannon Sheek, OT and Jill Tucker, OT ° °[facility] [place] [facility] (ONLY SERVES VETERANS!!) °[facility] °[address] °[place], [place]  [address] °[phone] °http://www.[place].gov/services/Physical_Medicine_Rehabilitation_Services.asp °Evaluators:  Eric Andrews, KT; Heidi Harris, KT;  Gary Whitaker, KT (KT=kiniesotherapist) ° ° °Clinical evaluations only:  Includes clinical testing, refers to other programs or local certified driving instructor for behind the wheel testing. ° °[facility] (outpatient Rehab) °[facility] °[address] °[place], [place] [address] °[phone] for scheduling °http://www.[facility].edu/Outpatient-Rehabilitation/Neurorehabilitation-Therapy.htm °Evaluators:  Kimberely Mccannon [place], OT; Kate Phillips, OT ° °Other area clinical evaluators available upon request including [facility], [facility] and [facility]. ° ° °    Resource List °What is a Driver Evaluation: °Your Road Ahead - A Guide to Comprehensive Driving Evaluations °http://www.thehartford.com/resources/mature-market-excellence/publications-on-aging ° °Association for [employer] [facility] Services - Disability and Driving Fact Sheets °http://www.aded.net/?page=510 ° °Driving after a Brain  Injury: °[employer] °http://www.biausa.org/tbims-abstracts/if-there-is-an-effective-way-to-determine-if-someone-is-ready-to-drive-after-tbi?A=SearchResult&SearchID=9495675&ObjectID=2758842&ObjectType=35 ° °Driving with Adaptive Equipment: °[facility] °http://www.driver-rehab.com/adaptive-equipment ° °[employer] Mob[employer] °http://www.nmeda.com/ ° ° ° ° ° ° °  °

## 2023-01-15 NOTE — Therapy (Signed)
OUTPATIENT OCCUPATIONAL THERAPY NEURO TREATMENT  Patient Name: Joseph Bright MRN: [id number] DOB:06-Aug-1975, 47 y.o., male Today's Date: 01/15/2023  PCP: Noberto Retort, MD  REFERRING PROVIDER: Charlton Amor, PA-C   END OF SESSION:  OT End of Session - 01/15/23 1014     Visit Number 21    Number of Visits 22    Date for OT Re-Evaluation 01/26/23    Authorization Type [employer] (60 VL - OT, PT, ST)    OT Start Time 1015    OT Stop Time 1100    OT Time Calculation (min) 45 min    Equipment Utilized During Treatment gait belt    Activity Tolerance Patient tolerated treatment well    Behavior During Therapy WFL for tasks assessed/performed            Past Medical History:  Diagnosis Date   Hypertension    Stroke [facility])    Past Surgical History:  Procedure Laterality Date   IR CT HEAD LTD  08/07/2022   IR INTRA CRAN STENT  08/07/2022   IR PERCUTANEOUS ART THROMBECTOMY/INFUSION INTRACRANIAL INC DIAG ANGIO  07/25/2022   LOOP RECORDER INSERTION N/A 07/28/2022   Procedure: LOOP RECORDER INSERTION;  Surgeon: Regan Lemming, MD;  Location: [facility] INVASIVE CV LAB;  Service: Cardiovascular;  Laterality: N/A;   RADIOLOGY WITH ANESTHESIA N/A 07/25/2022   Procedure: RADIOLOGY WITH ANESTHESIA;  Surgeon: Radiologist, Medication, MD;  Location: [facility] OR;  Service: Radiology;  Laterality: N/A;   TEE WITHOUT CARDIOVERSION N/A 07/28/2022   Procedure: TRANSESOPHAGEAL ECHOCARDIOGRAM;  Surgeon: Parke Poisson, MD;  Location: [facility] INVASIVE CV LAB;  Service: Cardiovascular;  Laterality: N/A;   Patient Active Problem List   Diagnosis Date Noted   Hemiparesis affecting right side as late effect of cerebrovascular accident (CVA) (HCC) 12/05/2022   Gait disturbance, post-stroke 12/05/2022   Loud snoring 10/18/2022   Adjustment disorder with mixed anxiety and depressed mood 08/07/2022   Cerebrovascular accident (CVA) due to occlusion of left posterior communicating artery (HCC) 08/01/2022   Acute  ischemic left ACA stroke (HCC) 07/25/2022   Arterial ischemic stroke, ACA (anterior cerebral artery), left, acute (HCC) 07/25/2022   DYSLIPIDEMIA 04/24/2007   Renal failure 11/29/2006   Essential hypertension 11/12/2006    ONSET DATE: 07/25/2022  REFERRING DIAG: I61.9 (ICD-10-CM) - CVA (cerebrovascular accident due to intracerebral hemorrhage)   THERAPY DIAG:  Other lack of coordination  Unsteadiness on feet  Muscle weakness (generalized)  Other disturbances of skin sensation  Rationale for Evaluation and Treatment: Rehabilitation  SUBJECTIVE:   SUBJECTIVE STATEMENT: No pain and no falls I'm going back to work part-time with adaptations put in place.   Pt accompanied by: Self  PERTINENT HISTORY: Pt presented to ED 07/25/2022. "Cranial CT scan negative for acute changes. There was chronic appearing right thalamic lacunar infarct. 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 Status post TNK with interventional radiology for stenting. MRI follow-up showed confluent left ACA territory infarction. Cytotoxic edema with no hemorrhagic transformation. Multiple additional scattered mostly punctate ischemic foci in the left greater than right MCA, PCA and occasionally cerebellar artery territories."  Pt in IRF from 08/01/2022 -08/31/2022 then discharged to OP OT, PT, and ST.   PRECAUTIONS: Fall  WEIGHT BEARING RESTRICTIONS: No  PAIN:  Are you having pain? No  FALLS: Has patient fallen in last 6 months? No  LIVING ENVIRONMENT: Lives with: lives with their family Lives in: House/apartment Stairs: No Has following equipment at home: Dan Humphreys -  2 wheeled, Environmental consultant - 4 wheeled, Wheelchair (manual), bed side commode, and Grab bars  PLOF: Independent; full-time working for [employer]; driving; sports (football and basketball)  PATIENT GOALS: return to [employer]  OBJECTIVE:   HAND DOMINANCE: Right  ADLs: Overall ADLs: min A  IADLs: Shopping:  dependent Light housekeeping: dependent Community mobility: dependent  Handwriting: Not legible  MOBILITY STATUS: Needs Assist: manual wheelchair  ACTIVITY TOLERANCE: Activity tolerance: fair  FUNCTIONAL OUTCOME MEASURES: FOTO: Slight: 59 10/23/2022: 79   UPPER EXTREMITY ROM:     AROM Right (eval) Left (eval)  Shoulder flexion WNL WNL  Shoulder abduction WNL WNL  Elbow flexion WNL WNL  Elbow extension WNL WNL  Wrist flexion WNL WNL  Wrist extension WNL WNL  Wrist pronation WNL WNL  Wrist supination WNL WNL   Digit Composite Flexion WNL WNL  Digit Composite Extension WNL WNL  Digit Opposition WNL WNL  (Blank rows = not tested)  RUE: IR to hip; overall bradykinesia with tremor  UPPER EXTREMITY MMT:     MMT Right (eval) Left (eval)  Shoulder flexion 4/5 WNL  Shoulder abduction 4/5 WNL  Elbow flexion 4/5 WNL  Elbow extension 4/5 WNL  (Blank rows = not tested)  HAND FUNCTION: Grip strength: Right: 38.8 lbs; Left: 82.8 lbs 10/23/2022: 57.5 lbs  COORDINATION: 9 Hole Peg test: Right: 139 sec; Left: 32 sec 10/23/2022: 59 seconds  SENSATION: Paresthesias reported in R fingertips  EDEMA: none reported or observed  MUSCLE TONE: WFL  COGNITION: Overall cognitive status: Within functional limits for tasks assessed  VISION: Subjective report: no changes Baseline vision: Wears glasses for reading only Visual history:  none  VISION ASSESSMENT: WFL  PERCEPTION: WFL  PRAXIS: WFL  OBSERVATIONS: Pt appears well-kept. He is able to propel w/c with increased time. Has RW with him.   TODAY'S TREATMENT:      Began checking updated LTG's - see below goal section  Environmental Scanning with 10/13 accuracy = 77% on first pass. Pt missed 3, and found 2/3 items on second pass, missing last item on lower Rt. Noted pt missing things more on lower Rt side. Also noted this w/ walker negotiation and clipping objects w/ walker Rt side. Pt asked to provide foods w/  letters of the alphabet for dual tasking, however pt had to stop and think about food and was unable to look and perform category generation simultaneously  Pt issued driver evaluation program info, however do not think he is safe to drive currently d/t inattention, physical limitations, and decreased processing speed and inability to dual task   PATIENT EDUCATION: Education details: coordination; standing balance and safety Person educated: Patient Education method: Explanation, Demonstration, and Verbal cues Education comprehension: verbalized understanding, returned demonstration, and verbal cues required  HOME EXERCISE PROGRAM: 09/12/2022: Chilton Si putty and coordination HEPs 09/27/22: Additional coordination HEP  10/04/22: shoulder girdle/scapula strengthening HEP  10/23/2022: Shoulder slides and alphabet 12/25/2022:  Updated Red theraband HEP--see pt instructions  GOALS:  SHORT TERM GOALS: Target date: 10/10/2022    Patient will demonstrate independence with initial RUE HEP. Baseline: Goal status: MET  2.  Patient will demonstrate at least 45 lbs R grip strength as needed to open jars and other containers. Baseline: 38.8 lbs 10/23/2022: 57.5 lbs Goal status: MET  3.  Pt will report no difficulty holding a dish while washing it.  Baseline: with some difficulty 10/23/2022: has not tried recently; simulated in clinic: little difficulty Goal status: MET   LONG TERM GOALS: Target date: 12/26/22 -  01/26/23    Patient will demonstrate updated RUE HEP with 25% verbal cues or less for proper execution. Baseline:  Goal status: MET  2.  Patient will demonstrate at least 50 lbs R grip strength as needed to open jars and other containers. Baseline: 38.8 lbs 10/23/2022: 57.5 lbs, 01/15/23: 89 LBS Goal status: MET  3.  Patient will complete nine-hole peg with use of R in 45 seconds or less. Baseline: 139 seconds 10/23/2022: 59 seconds; GOAL UPDATED (< or = 60 seconds originally) Goal  status: MET (01/15/23 = 40 SEC)   4.  Pt will complete d/c FOTO.  Baseline: 59 10/23/2022: 79 Goal status: MET  5.  Pt will type at 18 wpm maintaining 90% or greater accuracy  Baseline: 10 wpm, 91% accuracy Goal status: NOT MET (01/01/23 and 01/15/23 - 12 wpm, 94% accuracy)  6.  Pt will write paragraph maintaining 100% legibility in print Baseline:  Goal status: NOT MET (80-90%)   7.  Pt will perform environmental scanning with simple physical or cognitive task at 90% accuracy in prep for return to driving Baseline:  95/08/2128: 29/30 with word cancellation Goal status:  NOT MET (01/15/23: 10/13 items = 77% accuracy)  8.  Pt will be able to pick up items from floor up to 10 # with countertop support for balance Baseline:  Goal status: INITIAL  9.  Pt will demo kitchen tasks (cooking) from w/c level prn and safety to stand at counter to get items from higher shelves Baseline:  Goal status: MET (per pt report)      ASSESSMENT:  CLINICAL IMPRESSION: Pt demo decreased attention Rt side especially lower Rt side during functional ambulation and environmental scanning. Pt is not currently safe to drive - pt is aware and agrees.   PERFORMANCE DEFICITS: in functional skills including ADLs, IADLs, coordination, dexterity, strength, Fine motor control, Gross motor control, mobility, endurance, and UE functional use  IMPAIRMENTS: are limiting patient from ADLs, IADLs, work, and leisure.   CO-MORBIDITIES: may have co-morbidities  that affects occupational performance. Patient will benefit from skilled OT to address above impairments and improve overall function.  REHAB POTENTIAL: Good  PLAN:  OT FREQUENCY: 2x/week  OT DURATION: additional 4 weeks BEGINNING in October  PLANNED INTERVENTIONS: self care/ADL training, therapeutic exercise, therapeutic activity, neuromuscular re-education, manual therapy, functional mobility training, splinting, ultrasound, fluidotherapy, patient/family  education, DME and/or AE instructions, and Re-evaluation  RECOMMENDED OTHER SERVICES: Receiving PT and ST  CONSULTED AND AGREED WITH PLAN OF CARE: Patient and family member/caregiver  PLAN FOR NEXT SESSION: check remaining goal and d/c next session  Sheran Lawless, OTR/L 01/15/2023, 10:15 AM

## 2023-01-17 ENCOUNTER — Telehealth: Payer: Self-pay

## 2023-01-17 NOTE — Telephone Encounter (Signed)
Left message on wife, [name]'s phone (ok per DPR) regarding changing office visit until after CMR has been completed.

## 2023-01-17 NOTE — Telephone Encounter (Signed)
Pt has been rescheduled for after CMR.

## 2023-01-18 ENCOUNTER — Ambulatory Visit: Payer: Managed Care, Other (non HMO) | Admitting: Physical Therapy

## 2023-01-18 ENCOUNTER — Ambulatory Visit: Payer: Managed Care, Other (non HMO) | Admitting: Occupational Therapy

## 2023-01-18 ENCOUNTER — Encounter: Payer: Self-pay | Admitting: Occupational Therapy

## 2023-01-18 DIAGNOSIS — R2681 Unsteadiness on feet: Secondary | ICD-10-CM

## 2023-01-18 DIAGNOSIS — M6281 Muscle weakness (generalized): Secondary | ICD-10-CM

## 2023-01-18 DIAGNOSIS — R208 Other disturbances of skin sensation: Secondary | ICD-10-CM

## 2023-01-18 DIAGNOSIS — R278 Other lack of coordination: Secondary | ICD-10-CM

## 2023-01-18 NOTE — Therapy (Signed)
OUTPATIENT OCCUPATIONAL THERAPY NEURO TREATMENT/DISCHARGE  Patient Name: Joseph Bright MRN: [id number] DOB:February 10, 1976, 47 y.o., male Today's Date: 01/18/2023  PCP: Noberto Retort, MD  REFERRING PROVIDER: Charlton Amor, PA-C   END OF SESSION:  OT End of Session - 01/18/23 0932     Visit Number 22    Number of Visits 22    Date for OT Re-Evaluation 01/26/23    Authorization Type [employer] (60 VL - OT, PT, ST)    OT Start Time 0930    OT Stop Time 1010    OT Time Calculation (min) 40 min    Equipment Utilized During Treatment gait belt    Activity Tolerance Patient tolerated treatment well    Behavior During Therapy WFL for tasks assessed/performed            Past Medical History:  Diagnosis Date   Hypertension    Stroke [facility])    Past Surgical History:  Procedure Laterality Date   IR CT HEAD LTD  08/07/2022   IR INTRA CRAN STENT  08/07/2022   IR PERCUTANEOUS ART THROMBECTOMY/INFUSION INTRACRANIAL INC DIAG ANGIO  07/25/2022   LOOP RECORDER INSERTION N/A 07/28/2022   Procedure: LOOP RECORDER INSERTION;  Surgeon: Regan Lemming, MD;  Location: [facility] INVASIVE CV LAB;  Service: Cardiovascular;  Laterality: N/A;   RADIOLOGY WITH ANESTHESIA N/A 07/25/2022   Procedure: RADIOLOGY WITH ANESTHESIA;  Surgeon: Radiologist, Medication, MD;  Location: [facility] OR;  Service: Radiology;  Laterality: N/A;   TEE WITHOUT CARDIOVERSION N/A 07/28/2022   Procedure: TRANSESOPHAGEAL ECHOCARDIOGRAM;  Surgeon: Parke Poisson, MD;  Location: [facility] INVASIVE CV LAB;  Service: Cardiovascular;  Laterality: N/A;   Patient Active Problem List   Diagnosis Date Noted   Hemiparesis affecting right side as late effect of cerebrovascular accident (CVA) (HCC) 12/05/2022   Gait disturbance, post-stroke 12/05/2022   Loud snoring 10/18/2022   Adjustment disorder with mixed anxiety and depressed mood 08/07/2022   Cerebrovascular accident (CVA) due to occlusion of left posterior communicating artery (HCC) 08/01/2022    Acute ischemic left ACA stroke (HCC) 07/25/2022   Arterial ischemic stroke, ACA (anterior cerebral artery), left, acute (HCC) 07/25/2022   DYSLIPIDEMIA 04/24/2007   Renal failure 11/29/2006   Essential hypertension 11/12/2006    ONSET DATE: 07/25/2022  REFERRING DIAG: I61.9 (ICD-10-CM) - CVA (cerebrovascular accident due to intracerebral hemorrhage)   THERAPY DIAG:  Other lack of coordination  Unsteadiness on feet  Muscle weakness (generalized)  Other disturbances of skin sensation  Rationale for Evaluation and Treatment: Rehabilitation  SUBJECTIVE:   SUBJECTIVE STATEMENT: No pain and no falls. Nothing new since last time I'm going back to work part-time with adaptations put in place.   Pt accompanied by: Self  PERTINENT HISTORY: Pt presented to ED 07/25/2022. "Cranial CT scan negative for acute changes. There was chronic appearing right thalamic lacunar infarct. 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 Status post TNK with interventional radiology for stenting. MRI follow-up showed confluent left ACA territory infarction. Cytotoxic edema with no hemorrhagic transformation. Multiple additional scattered mostly punctate ischemic foci in the left greater than right MCA, PCA and occasionally cerebellar artery territories."  Pt in IRF from 08/01/2022 -08/31/2022 then discharged to OP OT, PT, and ST.   PRECAUTIONS: Fall  WEIGHT BEARING RESTRICTIONS: No  PAIN:  Are you having pain? No  FALLS: Has patient fallen in last 6 months? No  LIVING ENVIRONMENT: Lives with: lives with their family Lives in: House/apartment Stairs: No Has  following equipment at home: Dan Humphreys - 2 wheeled, Environmental consultant - 4 wheeled, Wheelchair (manual), bed side commode, and Grab bars  PLOF: Independent; full-time working for [employer]; driving; sports (football and basketball)  PATIENT GOALS: return to [employer]  OBJECTIVE:   HAND DOMINANCE: Right  ADLs: Overall  ADLs: min A  IADLs: Shopping: dependent Light housekeeping: dependent Community mobility: dependent  Handwriting: Not legible  MOBILITY STATUS: Needs Assist: manual wheelchair  ACTIVITY TOLERANCE: Activity tolerance: fair  FUNCTIONAL OUTCOME MEASURES: FOTO: Slight: 59 10/23/2022: 79   UPPER EXTREMITY ROM:     AROM Right (eval) Left (eval)  Shoulder flexion WNL WNL  Shoulder abduction WNL WNL  Elbow flexion WNL WNL  Elbow extension WNL WNL  Wrist flexion WNL WNL  Wrist extension WNL WNL  Wrist pronation WNL WNL  Wrist supination WNL WNL   Digit Composite Flexion WNL WNL  Digit Composite Extension WNL WNL  Digit Opposition WNL WNL  (Blank rows = not tested)  RUE: IR to hip; overall bradykinesia with tremor  UPPER EXTREMITY MMT:     MMT Right (eval) Left (eval)  Shoulder flexion 4/5 WNL  Shoulder abduction 4/5 WNL  Elbow flexion 4/5 WNL  Elbow extension 4/5 WNL  (Blank rows = not tested)  HAND FUNCTION: Grip strength: Right: 38.8 lbs; Left: 82.8 lbs 10/23/2022: 57.5 lbs  COORDINATION: 9 Hole Peg test: Right: 139 sec; Left: 32 sec 10/23/2022: 59 seconds  SENSATION: Paresthesias reported in R fingertips  EDEMA: none reported or observed  MUSCLE TONE: WFL  COGNITION: Overall cognitive status: Within functional limits for tasks assessed  VISION: Subjective report: no changes Baseline vision: Wears glasses for reading only Visual history:  none  VISION ASSESSMENT: WFL  PERCEPTION: WFL  PRAXIS: WFL  OBSERVATIONS: Pt appears well-kept. He is able to propel w/c with increased time. Has RW with him.   TODAY'S TREATMENT:      Pt educated in walker bags, baskets, and trays to transport items and discussed pros/cons of each. Pt provided handouts and told where/how to purchase  Reviewed safety concerns, fall prevention, and attending to Rt lower side w/ walker negotiation especially in tight spots and around corners  Worked on forwards stepping  and side stepping along counter and retrieving cup from cabinet and sliding over to sink w/ cues for safety and at least one hand on countertop. Practiced picking up 5 # weight off floor with Rt hand, then Lt hand with opposing hand on countertop.   Manipulating pennies up to 5 from fingertip to/from palm translation to stack 3 stacks of 5.   UBE x 8 min, level 1 for normal reciprocal movement pattern and UB conditioning   PATIENT EDUCATION: See above  HOME EXERCISE PROGRAM: 09/12/2022: Green putty and coordination HEPs 09/27/22: Additional coordination HEP  10/04/22: shoulder girdle/scapula strengthening HEP  10/23/2022: Shoulder slides and alphabet 12/25/2022:  Updated Red theraband HEP--see pt instructions  GOALS:  SHORT TERM GOALS: Target date: 10/10/2022    Patient will demonstrate independence with initial RUE HEP. Baseline: Goal status: MET  2.  Patient will demonstrate at least 45 lbs R grip strength as needed to open jars and other containers. Baseline: 38.8 lbs 10/23/2022: 57.5 lbs Goal status: MET  3.  Pt will report no difficulty holding a dish while washing it.  Baseline: with some difficulty 10/23/2022: has not tried recently; simulated in clinic: little difficulty Goal status: MET   LONG TERM GOALS: Target date: 12/26/22 - 01/26/23    Patient will demonstrate updated  RUE HEP with 25% verbal cues or less for proper execution. Baseline:  Goal status: MET  2.  Patient will demonstrate at least 50 lbs R grip strength as needed to open jars and other containers. Baseline: 38.8 lbs 10/23/2022: 57.5 lbs, 01/15/23: 89 LBS Goal status: MET  3.  Patient will complete nine-hole peg with use of R in 45 seconds or less. Baseline: 139 seconds 10/23/2022: 59 seconds; GOAL UPDATED (< or = 60 seconds originally) Goal status: MET (01/15/23 = 40 SEC)   4.  Pt will complete d/c FOTO.  Baseline: 59 10/23/2022: 79 Goal status: MET  5.  Pt will type at 18 wpm maintaining 90% or  greater accuracy  Baseline: 10 wpm, 91% accuracy Goal status: NOT MET (01/01/23 and 01/15/23 - 12 wpm, 94% accuracy)  6.  Pt will write paragraph maintaining 100% legibility in print Baseline:  Goal status: NOT MET (80-90%)   7.  Pt will perform environmental scanning with simple physical or cognitive task at 90% accuracy in prep for return to driving Baseline:  51/10/8414: 29/30 with word cancellation Goal status:  NOT MET (01/15/23: 10/13 items = 77% accuracy)  8.  Pt will be able to pick up items from floor up to 10 # with countertop support for balance Baseline:  Goal status: PARTIALLY MET w/ 5 # weight  9.  Pt will demo kitchen tasks (cooking) from w/c level prn and safety to stand at counter to get items from higher shelves Baseline:  Goal status: MET (per pt report)      ASSESSMENT:  CLINICAL IMPRESSION: Pt has met all STG's and 5/9 LTG's. Pt continues to demo deficits in balance and coordination  PERFORMANCE DEFICITS: in functional skills including ADLs, IADLs, coordination, dexterity, strength, Fine motor control, Gross motor control, mobility, endurance, and UE functional use  IMPAIRMENTS: are limiting patient from ADLs, IADLs, work, and leisure.   CO-MORBIDITIES: may have co-morbidities  that affects occupational performance. Patient will benefit from skilled OT to address above impairments and improve overall function.  REHAB POTENTIAL: Good  PLAN:  OT FREQUENCY: 2x/week  OT DURATION: additional 4 weeks BEGINNING in October  PLANNED INTERVENTIONS: self care/ADL training, therapeutic exercise, therapeutic activity, neuromuscular re-education, manual therapy, functional mobility training, splinting, ultrasound, fluidotherapy, patient/family education, DME and/or AE instructions, and Re-evaluation  RECOMMENDED OTHER SERVICES: Receiving PT and ST  CONSULTED AND AGREED WITH PLAN OF CARE: Patient and family member/caregiver  PLAN  D/C O.T.   OCCUPATIONAL THERAPY  DISCHARGE SUMMARY  Visits from Start of Care: 22  Current functional level related to goals / functional outcomes: SEE ABOVE   Remaining deficits: Balance Coordination RUE strength Safety/attention Cognition   Education / Equipment: See above education   Patient agrees to discharge. Patient goals were partially met. Patient is being discharged due to being pleased with the current functional level. And reaching max rehab potential at this time.      Sheran Lawless, OTR/L 01/18/2023, 9:33 AM

## 2023-01-19 ENCOUNTER — Encounter
Payer: Managed Care, Other (non HMO) | Attending: Physical Medicine & Rehabilitation | Admitting: Physical Medicine & Rehabilitation

## 2023-01-19 ENCOUNTER — Encounter: Payer: Self-pay | Admitting: Physical Medicine & Rehabilitation

## 2023-01-19 VITALS — BP 138/87 | HR 64 | Ht 68.0 in | Wt 187.0 lb

## 2023-01-19 DIAGNOSIS — I69398 Other sequelae of cerebral infarction: Secondary | ICD-10-CM | POA: Insufficient documentation

## 2023-01-19 DIAGNOSIS — R269 Unspecified abnormalities of gait and mobility: Secondary | ICD-10-CM | POA: Insufficient documentation

## 2023-01-19 NOTE — Patient Instructions (Signed)
Will evaluate whether we can increase work hours a next visit.

## 2023-01-19 NOTE — Progress Notes (Signed)
Subjective:    Patient ID: Joseph Bright, male    DOB: [birth year], 47 y.o.   MRN: [id number] admitted to rehab 08/01/2022 for inpatient therapies to consist of PT, ST and OT at least three hours five days a week. Past admission physiatrist, therapy team and rehab RN have worked together to provide customized collaborative inpatient rehab.  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  Patient remained on aspirin and Brilinta per neurology services.  Initially with subcutaneous heparin for DVT prophylaxis discontinued ambulating greater than 100 feet.  Seizure prophylaxis EEG negative Keppra as indicated no seizure activity.  AKI on CKD stage IV follow-up chemistries followed by nephrology services on Renvela as well as sodium bicarb and latest creatinine 4.23.  Permissive hypertension remained controlled patient had been on amlodipine hydralazine labetalol prior to admission resume as needed follow-up outpatient.  Patient had been receiving CPAP while in the hospital and would need outpatient polysomnogram versus sleep study to qualify and plan ambulatory referral to pulmonary services   12/05/22 The patient has no falls at home.  His wife is no longer assisting him with ambulation into the bathroom. He ambulates around his complex several times a day.  She is back in office .   Worked with SLP for work Counselling psychologist, Nutritional therapist for [employer] as well as [employer] and speaks with customers at work, as well as checking errors for email.   HPI 47 year old male with history of left ACA infarct with primarily right lower extremity weakness returns today.  He has completed outpatient PT OT.  His goal is returning to work.  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  His company would like to have him back as soon as possible.  They are willing to provide him with  transportation to work Mod I bathing and dressing ,  No falls  Pain Inventory Average Pain 0 Pain Right Now 0 My pain is  no pain   LOCATION OF PAIN  no pain   BOWEL Number of stools per week: 7  BLADDER Normal    Mobility use a walker ability to climb steps?  yes do you drive?  no transfers alone  Function disabled: date disabled . I need assistance with the following:  independent  Neuro/Psych weakness numbness tremor trouble walking spasms  Prior Studies Any changes since last visit?  no  Physicians involved in your care Any changes since last visit?  no   History reviewed. No pertinent family history. Social History   Socioeconomic History   Marital status: Married    Spouse name: Not on file   Number of children: Not on file   Years of education: Not on file   Highest education level: Not on file  Occupational History   Not on file  Tobacco Use   Smoking status: Former    Current packs/day: 0.00    Types: Cigarettes    Start date: 04/17/1995    Quit date: 04/16/2000    Years since quitting: 22.7   Smokeless tobacco: Never  Substance and Sexual Activity   Alcohol use: Yes    Comment: weekends/special events   Drug use: Never   Sexual activity: Not on file  Other Topics Concern   Not on file  Social History Narrative   Not on file   Social Determinants of Health   Financial Resource Strain:  Not on file  Food Insecurity: Not on file  Transportation Needs: Not on file  Physical Activity: Not on file  Stress: Not on file  Social Connections: Not on file   Past Surgical History:  Procedure Laterality Date   IR CT HEAD LTD  08/07/2022   IR INTRA CRAN STENT  08/07/2022   IR PERCUTANEOUS ART THROMBECTOMY/INFUSION INTRACRANIAL INC DIAG ANGIO  07/25/2022   LOOP RECORDER INSERTION N/A 07/28/2022   Procedure: LOOP RECORDER INSERTION;  Surgeon: Regan Lemming, MD;  Location: [facility] INVASIVE CV LAB;  Service: Cardiovascular;  Laterality: N/A;    RADIOLOGY WITH ANESTHESIA N/A 07/25/2022   Procedure: RADIOLOGY WITH ANESTHESIA;  Surgeon: Radiologist, Medication, MD;  Location: [facility] OR;  Service: Radiology;  Laterality: N/A;   TEE WITHOUT CARDIOVERSION N/A 07/28/2022   Procedure: TRANSESOPHAGEAL ECHOCARDIOGRAM;  Surgeon: Parke Poisson, MD;  Location: [facility] INVASIVE CV LAB;  Service: Cardiovascular;  Laterality: N/A;   Past Medical History:  Diagnosis Date   Hypertension    Stroke (HCC)    Ht 5\' 8"  (1.727 m)   Wt 187 lb (84.8 kg)   BMI 28.43 kg/m   Opioid Risk Score:   Fall Risk Score:  `1  Depression screen [facility] 2/9     01/19/2023    9:23 AM 12/05/2022    9:11 AM 09/07/2022    1:14 PM  Depression screen PHQ 2/9  Decreased Interest 0 0 0  Down, Depressed, Hopeless 0 0 0  PHQ - 2 Score 0 0 0  Altered sleeping   1  Tired, decreased energy   0  Change in appetite   1  Feeling bad or failure about yourself    0  Trouble concentrating   0  Moving slowly or fidgety/restless   1  Suicidal thoughts   0  PHQ-9 Score   3      Review of Systems  Musculoskeletal:  Positive for gait problem.  All other systems reviewed and are negative.     Objective:   Physical Exam General No acute distress Mood and affect are appropriate Extremities without edema Motor strength is 5 - in the right deltoid bicep tricep grip 5/5 in the left deltoid bicep tricep grip 3+/5 in the right hip flexor knee extensor trace ankle dorsiflexor 5/5 in the left hip flexor knee extensor ankle dorsiflexor Sensation reported is equal to light touch bilateral upper and lower limbs. Ambulates with an AFO and rolling walker for longer distances and with a cane for shorter distances      Assessment & Plan:  1.  Left ACA infarct with right lower extremity greater than upper extremity weakness.  He needs an AFO to ambulate. I do think he may return to work 20 hours a week and a sedentary position. Like to see him back in 4 to 6 weeks to see whether we can  expand to 30 or 40 hours/week. We discussed no driving at the current time.  He may require a left foot accelerator Work letter written and signed

## 2023-01-22 ENCOUNTER — Ambulatory Visit (INDEPENDENT_AMBULATORY_CARE_PROVIDER_SITE_OTHER): Payer: Managed Care, Other (non HMO)

## 2023-01-22 DIAGNOSIS — I63522 Cerebral infarction due to unspecified occlusion or stenosis of left anterior cerebral artery: Secondary | ICD-10-CM | POA: Diagnosis not present

## 2023-01-23 ENCOUNTER — Inpatient Hospital Stay (HOSPITAL_COMMUNITY): Admission: RE | Admit: 2023-01-23 | Payer: Managed Care, Other (non HMO) | Source: Ambulatory Visit

## 2023-01-23 LAB — CUP PACEART REMOTE DEVICE CHECK
Date Time Interrogation Session: 20241027231516
Implantable Pulse Generator Implant Date: 20240503

## 2023-01-24 ENCOUNTER — Ambulatory Visit (INDEPENDENT_AMBULATORY_CARE_PROVIDER_SITE_OTHER): Payer: Managed Care, Other (non HMO) | Admitting: Neurology

## 2023-01-24 DIAGNOSIS — G40009 Localization-related (focal) (partial) idiopathic epilepsy and epileptic syndromes with seizures of localized onset, not intractable, without status epilepticus: Secondary | ICD-10-CM | POA: Diagnosis not present

## 2023-01-28 NOTE — Progress Notes (Signed)
Currently inform the patient that EEG or brainwave study was normal

## 2023-01-29 ENCOUNTER — Telehealth: Payer: Self-pay | Admitting: Anesthesiology

## 2023-01-29 NOTE — Telephone Encounter (Signed)
-----   Message from Delia Heady sent at 01/28/2023  3:44 PM EST ----- Currently inform the patient that EEG or brainwave study was normal

## 2023-01-30 ENCOUNTER — Telehealth: Payer: Self-pay

## 2023-01-30 NOTE — Telephone Encounter (Signed)
Patient called in stating [employer] faxed over paperwork today 01/30/23 and wanted to know if you have received it and fill it out, I advised patient if received it will be placed in your box and you will fill it out within a week or so.

## 2023-02-02 ENCOUNTER — Ambulatory Visit: Payer: Managed Care, Other (non HMO) | Admitting: Internal Medicine

## 2023-02-09 NOTE — Progress Notes (Signed)
[facility] Summary Report / Loop Recorder 

## 2023-02-25 LAB — CUP PACEART REMOTE DEVICE CHECK
Date Time Interrogation Session: 20241130231102
Implantable Pulse Generator Implant Date: 20240503

## 2023-02-26 ENCOUNTER — Ambulatory Visit (INDEPENDENT_AMBULATORY_CARE_PROVIDER_SITE_OTHER): Payer: Managed Care, Other (non HMO)

## 2023-02-26 DIAGNOSIS — I63522 Cerebral infarction due to unspecified occlusion or stenosis of left anterior cerebral artery: Secondary | ICD-10-CM | POA: Diagnosis not present

## 2023-03-02 ENCOUNTER — Encounter: Payer: Self-pay | Admitting: Physical Medicine & Rehabilitation

## 2023-03-02 ENCOUNTER — Encounter
Payer: Managed Care, Other (non HMO) | Attending: Physical Medicine & Rehabilitation | Admitting: Physical Medicine & Rehabilitation

## 2023-03-02 VITALS — BP 125/78 | HR 63 | Ht 68.0 in | Wt 185.0 lb

## 2023-03-02 DIAGNOSIS — R269 Unspecified abnormalities of gait and mobility: Secondary | ICD-10-CM | POA: Insufficient documentation

## 2023-03-02 DIAGNOSIS — I69398 Other sequelae of cerebral infarction: Secondary | ICD-10-CM | POA: Insufficient documentation

## 2023-03-02 DIAGNOSIS — I69351 Hemiplegia and hemiparesis following cerebral infarction affecting right dominant side: Secondary | ICD-10-CM | POA: Diagnosis present

## 2023-03-02 NOTE — Progress Notes (Signed)
Subjective:    Patient ID: Joseph Bright, male    DOB: [birth year], 47 y.o.   MRN: [id number] admitted to rehab 08/01/2022 for inpatient therapies to consist of PT, ST and OT at least three hours five days a week. Past admission physiatrist, therapy team and rehab RN have worked together to provide customized collaborative inpatient rehab.  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  Patient remained on aspirin and Brilinta per neurology services.  Initially with subcutaneous heparin for DVT prophylaxis discontinued ambulating greater than 100 feet.  Seizure prophylaxis EEG negative Keppra as indicated no seizure activity.  AKI on CKD stage IV follow-up chemistries followed by nephrology services on Renvela as well as sodium bicarb and latest creatinine 4.23.  Permissive hypertension remained controlled patient had been on amlodipine hydralazine labetalol prior to admission resume as needed follow-up outpatient.  Patient had been receiving CPAP while in the hospital and would need outpatient polysomnogram versus sleep study to qualify and plan ambulatory referral to pulmonary services  47 y.o. right-handed male with history of hypertension, CKD stage IV followed outpatient nephrology as well as medical noncompliance, quit smoking 22 years ago.  Per chart review lives with spouse and young daughter.  1 level home multiple steps to entry independent prior to admission.  Presented 07/25/2022 with acute onset of right-sided weakness as well as speech difficulty.  Cranial CT scan negative for acute changes.  There was chronic appearing right thalamic lacunar infarct.  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  Status post TNK with interventional radiology for stenting.  MRI follow-up showed confluent left ACA territory infarction.  Cytotoxic edema with no hemorrhagic transformation.  Multiple additional scattered mostly  punctate ischemic foci in the left greater than right MCA, PCA and occasionally cerebellar artery territories.  No significant mass effect.  Echocardiogram with ejection fraction of 70 to 75% grade 1 diastolic dysfunction.  Patient with reported seizure-like activity.  EEG negative for seizure.  Maintained on Keppra for seizure prophylaxis.  TEE unremarkable no PFO no cardioembolic source.  Neurology follow-up maintained on aspirin as well as Brilinta 90 mg twice daily for CVA prophylaxis.  Underwent loop recorder placement 07/28/2022.   Admit date: 08/01/2022 Discharge date: 08/31/2022 HPI 47 year old male who is approximately 7 months post left ACA infarct with residual right lower greater than upper extremity weakness.  Overall he has made a very good recovery he is ambulating with a walker.  He is independent with all his ADLs.  He has not resumed driving due to his weakness.  He has gotten back to work on a part-time basis. Working 20hours per week in sedentary position, feels like endurance is good enough for 30 h per week Still losing weight due to improved diet  Pain Inventory Average Pain 0 Pain Right Now 0 My pain is intermittent and tingling  LOCATION OF PAIN  Right leg tingling  BOWEL Number of stools per week: 4 Oral laxative use No  Type of laxative none Enema or suppository use No    BLADDER Normal    Mobility use a walker ability to climb steps?  yes do you drive?  no use a wheelchair transfers alone Do you have any goals in this area?  yes  Function employed # of hrs/week 20 hour per week at [employer]  Neuro/Psych tremor tingling trouble walking  Prior Studies Any changes since last visit?  no  Physicians involved in your care Any changes since last visit?  no   History reviewed. No pertinent family history. Social History   Socioeconomic History   Marital status: Married    Spouse name: Not on file   Number of children: Not on file   Years of  education: Not on file   Highest education level: Not on file  Occupational History   Not on file  Tobacco Use   Smoking status: Former    Current packs/day: 0.00    Types: Cigarettes    Start date: 04/17/1995    Quit date: 04/16/2000    Years since quitting: 22.8   Smokeless tobacco: Never  Vaping Use   Vaping status: Never Used  Substance and Sexual Activity   Alcohol use: Yes    Comment: weekends/special events   Drug use: Never   Sexual activity: Not on file  Other Topics Concern   Not on file  Social History Narrative   Not on file   Social Determinants of Health   Financial Resource Strain: Not on file  Food Insecurity: Not on file  Transportation Needs: Not on file  Physical Activity: Not on file  Stress: Not on file  Social Connections: Not on file   Past Surgical History:  Procedure Laterality Date   IR CT HEAD LTD  08/07/2022   IR INTRA CRAN STENT  08/07/2022   IR PERCUTANEOUS ART THROMBECTOMY/INFUSION INTRACRANIAL INC DIAG ANGIO  07/25/2022   LOOP RECORDER INSERTION N/A 07/28/2022   Procedure: LOOP RECORDER INSERTION;  Surgeon: Regan Lemming, MD;  Location: [facility] INVASIVE CV LAB;  Service: Cardiovascular;  Laterality: N/A;   RADIOLOGY WITH ANESTHESIA N/A 07/25/2022   Procedure: RADIOLOGY WITH ANESTHESIA;  Surgeon: Radiologist, Medication, MD;  Location: [facility] OR;  Service: Radiology;  Laterality: N/A;   TEE WITHOUT CARDIOVERSION N/A 07/28/2022   Procedure: TRANSESOPHAGEAL ECHOCARDIOGRAM;  Surgeon: Parke Poisson, MD;  Location[facility] INVASIVE CV LAB;  Service: Cardiovascular;  Laterality: N/A;   Past Medical History:  Diagnosis Date   Hypertension    Stroke (HCC)    Ht 5\' 8"  (1.727 m)   Wt 185 lb (83.9 kg)   BMI 28.13 kg/m   Opioid Risk Score:   Fall Risk Score:  `1  Depression screen [facility] 2/9     03/02/2023    9:44 AM 01/19/2023    9:23 AM 12/05/2022    9:11 AM 09/07/2022    1:14 PM  Depression screen PHQ 2/9  Decreased Interest 0 0 0 0  Down,  Depressed, Hopeless 0 0 0 0  PHQ - 2 Score 0 0 0 0  Altered sleeping    1  Tired, decreased energy    0  Change in appetite    1  Feeling bad or failure about yourself     0  Trouble concentrating    0  Moving slowly or fidgety/restless    1  Suicidal thoughts    0  PHQ-9 Score    3    Review of Systems  Musculoskeletal:  Positive for gait problem.  Neurological:  Positive for tremors.       Tingling  All other systems reviewed and are negative.      Objective:   Physical Exam  General No acute distress Mood and affect appropriate Extremities without edema Motor strength is 4/5 in the right deltoid bicep tricep grip he has mild dysdiadochokinesis with rapid alternating supination pronation of the right upper extremity compared to left side.  Left upper extremity strength is normal Left lower extremity strength is normal Right lower extremity strength 3 - at the hip flexor knee extensor 0 at the ankle dorsiflexor Ambulates with an AFO as well as a rolling walker no evidence of toe drag but he does do some hip hiking on the right side to clear the right foot.      Assessment & Plan:  46.  47 year old male 7 months post left ACA infarct with right hemiparesis affecting the lower extremity greater than upper extremities has as expected. Excellent recovery functionally.  Will increase work hours to 30 hours/week will reassess in 6 weeks see if he can go back to 40 hours/week. No driving unless he has modifications of his vehicle.

## 2023-03-07 ENCOUNTER — Encounter (HOSPITAL_COMMUNITY): Payer: Self-pay

## 2023-03-08 ENCOUNTER — Telehealth (HOSPITAL_COMMUNITY): Payer: Self-pay | Admitting: *Deleted

## 2023-03-08 NOTE — Telephone Encounter (Signed)
Attempted to call patient regarding upcoming cardiac MRI appointment. Left message on voicemail with name and callback number  Hae Ahlers RN Navigator Cardiac Imaging [facility] [phone] Office [phone] Cell  

## 2023-03-09 ENCOUNTER — Ambulatory Visit (HOSPITAL_COMMUNITY)
Admission: RE | Admit: 2023-03-09 | Discharge: 2023-03-09 | Disposition: A | Discharge status: Home / Self Care | Payer: Managed Care, Other (non HMO) | Source: Ambulatory Visit | Attending: Internal Medicine

## 2023-03-09 ENCOUNTER — Other Ambulatory Visit: Payer: Self-pay | Admitting: Internal Medicine

## 2023-03-09 DIAGNOSIS — I517 Cardiomegaly: Secondary | ICD-10-CM

## 2023-03-09 DIAGNOSIS — I429 Cardiomyopathy, unspecified: Secondary | ICD-10-CM | POA: Diagnosis not present

## 2023-03-09 MED ORDER — GADOBUTROL 1 MMOL/ML IV SOLN
10.0000 mL | Freq: Once | INTRAVENOUS | Status: AC | PRN
  Administered 2023-03-09: 10 mL via INTRAVENOUS

## 2023-04-02 ENCOUNTER — Ambulatory Visit: Payer: Managed Care, Other (non HMO)

## 2023-04-02 DIAGNOSIS — I63522 Cerebral infarction due to unspecified occlusion or stenosis of left anterior cerebral artery: Secondary | ICD-10-CM | POA: Diagnosis not present

## 2023-04-02 LAB — CUP PACEART REMOTE DEVICE CHECK
Date Time Interrogation Session: 20250105231826
Implantable Pulse Generator Implant Date: 20240503

## 2023-04-06 ENCOUNTER — Ambulatory Visit: Payer: Managed Care, Other (non HMO) | Attending: Internal Medicine | Admitting: Internal Medicine

## 2023-04-06 ENCOUNTER — Encounter: Payer: Self-pay | Admitting: Internal Medicine

## 2023-04-06 VITALS — BP 120/64 | HR 56 | Ht 68.0 in

## 2023-04-06 DIAGNOSIS — I63522 Cerebral infarction due to unspecified occlusion or stenosis of left anterior cerebral artery: Secondary | ICD-10-CM | POA: Diagnosis not present

## 2023-04-06 DIAGNOSIS — N189 Chronic kidney disease, unspecified: Secondary | ICD-10-CM

## 2023-04-06 DIAGNOSIS — I10 Essential (primary) hypertension: Secondary | ICD-10-CM | POA: Diagnosis not present

## 2023-04-06 DIAGNOSIS — E782 Mixed hyperlipidemia: Secondary | ICD-10-CM | POA: Diagnosis not present

## 2023-04-06 DIAGNOSIS — I517 Cardiomegaly: Secondary | ICD-10-CM

## 2023-04-06 NOTE — Patient Instructions (Signed)
 Medication Instructions:  Your physician recommends that you continue on your current medications as directed. Please refer to the Current Medication list given to you today.  *If you need a refill on your cardiac medications before your next appointment, please call your pharmacy*  Lab Work: None  Follow-Up: At [facility], you and your health needs are our priority.  As part of our continuing mission to provide you with exceptional heart care, we have created designated Provider Care Teams.  These Care Teams include your primary Cardiologist (physician) and Advanced Practice Providers (APPs -  Physician Assistants and Nurse Practitioners) who all work together to provide you with the care you need, when you need it.  Your next appointment:   6 month(s)  Provider:   Parke Poisson, MD

## 2023-04-06 NOTE — Progress Notes (Signed)
 Cardiology Office Note:  .   Date:  04/06/2023  ID:  Joseph Bright, DOB 1975/08/31, MRN [id number] PCP: Joseph Elsie SAUNDERS, MD  [facility] Providers Cardiologist:  Joseph DELENA Merck, MD    History of Present Illness: Joseph Bright is a 48 y.o. male.  Discussed the use of AI scribe software for clinical note transcription with the patient, who gave verbal consent to proceed.  History of Present Illness   The patient, with a history of hypertension and recent stroke, presents for a follow-up visit after an MRI. The patient's blood pressure has been well controlled on his current medication regimen, and he reports consistent readings of around 120-130 at home.  The patient's recovery from a recent stroke is progressing well. 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 He is now able to write better and is walking with a cane at home, using a walker for longer trips. He is no longer using a wheelchair.  The patient is currently on amlodipine  10mg , carvedilol  25mg  twice a day, and hydralazine  50mg  twice a day for blood pressure control. He is also on Crestor  for cholesterol management and aspirin  for stroke prevention. He reports no bleeding issues on the aspirin .        ROS: negative except per HPI above.  Studies Reviewed: SABRA   EKG Interpretation Date/Time:  Friday April 06 2023 10:16:57 EST Ventricular Rate:  56 PR Interval:  174 QRS Duration:  84 QT Interval:  400 QTC Calculation: 386 R Axis:   28  Text Interpretation: Sinus bradycardia When compared with ECG of 06-Apr-2023 10:16, No significant change was found Confirmed by Bright Joseph ([id number]) on 04/06/2023 10:29:27 AM    Results   RADIOLOGY Heart MRI: Left ventricular hypertrophy; normal ejection fraction  DIAGNOSTIC EKG: Normal sinus rhythm; heart rate 56 bpm (04/06/2023)     Risk  Assessment/Calculations:        Physical Exam:   VS:  BP 120/64 (BP Location: Left Arm, Patient Position: Sitting, Cuff Size: Normal)   Pulse (!) 56   Ht 5' 8 (1.727 m)   SpO2 99%   BMI 28.13 kg/m    Wt Readings from Last 3 Encounters:  03/02/23 185 lb (83.9 kg)  01/19/23 187 lb (84.8 kg)  12/26/22 194 lb (88 kg)     Physical Exam   VITALS: P- 56, BP- 110/70 CARDIOVASCULAR: Heart rate 56, heart sounds normal EXTREMITIES: No edema, especially on the right side     GEN: Well nourished, well developed in no acute distress NECK: No JVD; No carotid bruits CARDIAC: RRR, no murmurs, rubs, gallops RESPIRATORY:  Clear to auscultation without rales, wheezing or rhonchi  ABDOMEN: Soft, non-tender, non-distended EXTREMITIES:  No edema; No deformity   ASSESSMENT AND PLAN: .    Assessment & Plan Essential hypertension  Arterial ischemic stroke, ACA (anterior cerebral artery), left, acute (HCC)  Chronic kidney disease, unspecified CKD stage  Mixed hyperlipidemia  LVH (left ventricular hypertrophy)   Assessment and Plan    Hypertensive Heart Disease MRI showed hypertrophy likely secondary to hypertension. Blood pressure well controlled on current regimen (Amlodipine  10mg , Carvedilol  25mg  BID, Hydralazine  50mg  BID). No new medications or workup required for LVH. -Continue current antihypertensive regimen. -Check blood pressure at home daily.  Post-Stroke Recovery Right-sided weakness and writing difficulty post-stroke. Significant improvement noted with return to work and decreased reliance on mobility  aids. -Continue current management and rehabilitation efforts.  Hyperlipidemia Well controlled on Crestor . -Continue Crestor .  Secondary Stroke Prevention On Aspirin . No bleeding issues reported. -Continue Aspirin .  Follow-up in 6 months. Repeat lipids and chemistry at next visit.

## 2023-04-13 ENCOUNTER — Ambulatory Visit: Payer: Managed Care, Other (non HMO) | Admitting: Physical Medicine & Rehabilitation

## 2023-04-17 ENCOUNTER — Encounter
Payer: Managed Care, Other (non HMO) | Attending: Physical Medicine & Rehabilitation | Admitting: Physical Medicine & Rehabilitation

## 2023-04-17 ENCOUNTER — Encounter: Payer: Self-pay | Admitting: Physical Medicine & Rehabilitation

## 2023-04-17 VITALS — BP 151/91 | HR 60 | Ht 68.0 in | Wt 190.0 lb

## 2023-04-17 DIAGNOSIS — I69398 Other sequelae of cerebral infarction: Secondary | ICD-10-CM | POA: Insufficient documentation

## 2023-04-17 DIAGNOSIS — R269 Unspecified abnormalities of gait and mobility: Secondary | ICD-10-CM | POA: Diagnosis not present

## 2023-04-17 DIAGNOSIS — I69351 Hemiplegia and hemiparesis following cerebral infarction affecting right dominant side: Secondary | ICD-10-CM | POA: Insufficient documentation

## 2023-04-17 NOTE — Progress Notes (Signed)
Subjective:    Patient ID: Joseph Bright, male    DOB: 1975-09-02, 48 y.o.   MRN: [id number] admitted to rehab 08/01/2022 for inpatient therapies to consist of PT, ST and OT at least three hours five days a week. Past admission physiatrist, therapy team and rehab RN have worked together to provide customized collaborative inpatient rehab.  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  Patient remained on aspirin and Brilinta per neurology services.  Initially with subcutaneous heparin for DVT prophylaxis discontinued ambulating greater than 100 feet.  Seizure prophylaxis EEG negative Keppra as indicated no seizure activity.  AKI on CKD stage IV follow-up chemistries followed by nephrology services on Renvela as well as sodium bicarb and latest creatinine 4.23.  Permissive hypertension remained controlled patient had been on amlodipine hydralazine labetalol prior to admission resume as needed follow-up outpatient.  Patient had been receiving CPAP while in the hospital and would need outpatient polysomnogram versus sleep study to qualify and plan ambulatory referral to pulmonary services  48 y.o. right-handed male with history of hypertension, CKD stage IV followed outpatient nephrology as well as medical noncompliance, quit smoking 22 years ago.  Per chart review lives with spouse and young daughter.  1 level home multiple steps to entry independent prior to admission.  Presented 07/25/2022 with acute onset of right-sided weakness as well as speech difficulty.  Cranial CT scan negative for acute changes.  There was chronic appearing right thalamic lacunar infarct.  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  Status post TNK with interventional radiology for stenting.  MRI follow-up showed confluent left ACA territory infarction.  Cytotoxic edema with no hemorrhagic transformation.  Multiple additional scattered mostly  punctate ischemic foci in the left greater than right MCA, PCA and occasionally cerebellar artery territories.  No significant mass effect.  Echocardiogram with ejection fraction of 70 to 75% grade 1 diastolic dysfunction.  Patient with reported seizure-like activity.  EEG negative for seizure.  Maintained on Keppra for seizure prophylaxis.  TEE unremarkable no PFO no cardioembolic source.  Neurology follow-up maintained on aspirin as well as Brilinta 90 mg twice daily for CVA prophylaxis.  Underwent loop recorder placement 07/28/2022.   Admit date: 08/01/2022 Discharge date: 08/31/2022 HPI 48 year old male with left ACA distribution infarct he has right lower extremity greater than upper extremity weakness.  He has gone back to work and does 30 hours/week.  He still does not drive due to lack of movement in the right foot.  Previously had spoken about getting adaptive equipment such as a left foot accelerator which she has not pursued. His wife is doing range of motion exercises for his right lower extremity.  Pain Inventory Average Pain 0 Pain Right Now 0 My pain is  no pain stroke follow up     No family history on file. Social History   Socioeconomic History   Marital status: Married    Spouse name: Not on file   Number of children: Not on file   Years of education: Not on file   Highest education level: Not on file  Occupational History   Not on file  Tobacco Use   Smoking status: Former    Current packs/day: 0.00    Types: Cigarettes    Start date: 04/17/1995    Quit date: 04/16/2000    Years since quitting: 23.0   Smokeless tobacco: Never  Vaping Use  Vaping status: Never Used  Substance and Sexual Activity   Alcohol use: Yes    Comment: weekends/special events   Drug use: Never   Sexual activity: Not Currently  Other Topics Concern   Not on file  Social History Narrative   Not on file   Social Drivers of Health   Financial Resource Strain: Not on file  Food  Insecurity: Not on file  Transportation Needs: Not on file  Physical Activity: Not on file  Stress: Not on file  Social Connections: Not on file   Past Surgical History:  Procedure Laterality Date   IR CT HEAD LTD  08/07/2022   IR INTRA CRAN STENT  08/07/2022   IR PERCUTANEOUS ART THROMBECTOMY/INFUSION INTRACRANIAL INC DIAG ANGIO  07/25/2022   LOOP RECORDER INSERTION N/A 07/28/2022   Procedure: LOOP RECORDER INSERTION;  Surgeon: Regan Lemming, MD;  Location: [facility] INVASIVE CV LAB;  Service: Cardiovascular;  Laterality: N/A;   RADIOLOGY WITH ANESTHESIA N/A 07/25/2022   Procedure: RADIOLOGY WITH ANESTHESIA;  Surgeon: Radiologist, Medication, MD;  Location: [facility] OR;  Service: Radiology;  Laterality: N/A;   TEE WITHOUT CARDIOVERSION N/A 07/28/2022   Procedure: TRANSESOPHAGEAL ECHOCARDIOGRAM;  Surgeon: Parke Poisson, MD;  Location[facility] INVASIVE CV LAB;  Service: Cardiovascular;  Laterality: N/A;   Past Surgical History:  Procedure Laterality Date   IR CT HEAD LTD  08/07/2022   IR INTRA CRAN STENT  08/07/2022   IR PERCUTANEOUS ART THROMBECTOMY/INFUSION INTRACRANIAL INC DIAG ANGIO  07/25/2022   LOOP RECORDER INSERTION N/A 07/28/2022   Procedure: LOOP RECORDER INSERTION;  Surgeon: Regan Lemming, MD;  Location: [facility] INVASIVE CV LAB;  Service: Cardiovascular;  Laterality: N/A;   RADIOLOGY WITH ANESTHESIA N/A 07/25/2022   Procedure: RADIOLOGY WITH ANESTHESIA;  Surgeon: Radiologist, Medication, MD;  Location: [facility] OR;  Service: Radiology;  Laterality: N/A;   TEE WITHOUT CARDIOVERSION N/A 07/28/2022   Procedure: TRANSESOPHAGEAL ECHOCARDIOGRAM;  Surgeon: Parke Poisson, MD;  Location: [facility] INVASIVE CV LAB;  Service: Cardiovascular;  Laterality: N/A;   Past Medical History:  Diagnosis Date   Hypertension    Stroke (HCC)    Ht 5\' 8"  (1.727 m)   BMI 28.13 kg/m   Opioid Risk Score:   Fall Risk Score:  `1  Depression screen [facility] 2/9     03/02/2023    9:44 AM 01/19/2023    9:23 AM 12/05/2022     9:11 AM 09/07/2022    1:14 PM  Depression screen [facility] 2/9  Decreased Interest 0 0 0 0  Down, Depressed, Hopeless 0 0 0 0  PHQ - 2 Score 0 0 0 0  Altered sleeping    1  Tired, decreased energy    0  Change in appetite    1  Feeling bad or failure about yourself     0  Trouble concentrating    0  Moving slowly or fidgety/restless    1  Suicidal thoughts    0  PHQ-9 Score    3     Review of Systems     Objective:   Physical Exam  General No acute distress Mood and affect appropriate Motor strength right upper extremity 4/5 in the deltoid, bicep, tricep, grip Right lower extremity to minus hip flexor 3 - knee extensor 0 at the ankle dorsiflexor and plantar flexor Tone is normal in the right upper extremity Tone in the right lower extremity MAS 1 in the hamstrings  Left sided strength is 5/5 in the deltoid, bicep and  tricep, grip, hip flexor, knee extensor, and dorsiflexor and plantar flexor. Ambulates with a rolling walker as well as a right AFO he does have some external rotation at the hip to help clear the right foot.  No evidence of knee and debility.     Assessment & Plan:   1.  History of left ACA infarct with resultant right lower extremity greater than upper remedy weakness.  Overall he has been functioning well basically modified independent level he has been back to work 30 hours a week.  He has not returned to driving due to right foot and ankle weakness, would recommend left foot accelerator. The patient may return to work on a 40 hours/week schedule.  He has a lifting and carrying restriction of 10 pounds.  He is to use a cane when ambulating at work Return to clinic in 3 months

## 2023-05-07 ENCOUNTER — Ambulatory Visit (INDEPENDENT_AMBULATORY_CARE_PROVIDER_SITE_OTHER): Payer: Managed Care, Other (non HMO)

## 2023-05-07 DIAGNOSIS — I63522 Cerebral infarction due to unspecified occlusion or stenosis of left anterior cerebral artery: Secondary | ICD-10-CM | POA: Diagnosis not present

## 2023-05-07 LAB — CUP PACEART REMOTE DEVICE CHECK
Date Time Interrogation Session: 20250209231952
Implantable Pulse Generator Implant Date: 20240503

## 2023-05-11 NOTE — Progress Notes (Signed)
[facility] Summary Report / Loop Recorder

## 2023-06-11 ENCOUNTER — Ambulatory Visit (INDEPENDENT_AMBULATORY_CARE_PROVIDER_SITE_OTHER): Payer: Managed Care, Other (non HMO)

## 2023-06-11 DIAGNOSIS — I63522 Cerebral infarction due to unspecified occlusion or stenosis of left anterior cerebral artery: Secondary | ICD-10-CM | POA: Diagnosis not present

## 2023-06-12 LAB — CUP PACEART REMOTE DEVICE CHECK
Date Time Interrogation Session: 20250316231923
Implantable Pulse Generator Implant Date: 20240503

## 2023-06-13 NOTE — Progress Notes (Signed)
 [facility] Summary Report / Loop Recorder

## 2023-06-21 ENCOUNTER — Encounter: Payer: Self-pay | Admitting: Physical Medicine & Rehabilitation

## 2023-07-16 ENCOUNTER — Ambulatory Visit (INDEPENDENT_AMBULATORY_CARE_PROVIDER_SITE_OTHER): Payer: Managed Care, Other (non HMO)

## 2023-07-16 DIAGNOSIS — I63522 Cerebral infarction due to unspecified occlusion or stenosis of left anterior cerebral artery: Secondary | ICD-10-CM

## 2023-07-17 ENCOUNTER — Encounter
Payer: Managed Care, Other (non HMO) | Attending: Physical Medicine & Rehabilitation | Admitting: Physical Medicine & Rehabilitation

## 2023-07-17 ENCOUNTER — Encounter: Payer: Self-pay | Admitting: Physical Medicine & Rehabilitation

## 2023-07-17 VITALS — BP 157/98 | HR 59 | Ht 68.0 in | Wt 192.2 lb

## 2023-07-17 DIAGNOSIS — G8111 Spastic hemiplegia affecting right dominant side: Secondary | ICD-10-CM | POA: Diagnosis present

## 2023-07-17 LAB — CUP PACEART REMOTE DEVICE CHECK
Date Time Interrogation Session: 20250420232110
Implantable Pulse Generator Implant Date: 20240503

## 2023-07-17 NOTE — Progress Notes (Signed)
 Subjective:    Patient ID: Joseph Bright, male    DOB: [birth year], 48 y.o.   MRN: [id number]  HPI 48 year old male with history of left ACA distribution infarct with chronic right lower extremity greater than left extremity weakness.  He was released to his job last January however because of his inability to drive he states that he was let go.  We discussed that previous recommendations were for left foot accelerator and that it To pursue this would recommend OT driving eval.  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  Unfortunately he does not have a vehicle now. He gets some shaking of his legs at night on the right side.  He describes the leg being extended as well as occasionally flexed with the spasms.  With tissue well in his AFO off on the right side he notices some toe curling when he walks as well. Pain Inventory Average Pain 4 Pain Right Now 0 My pain is  other  In the last 24 hours, has pain interfered with the following? General activity 7 Relation with others 8 Enjoyment of life 0 What TIME of day is your pain at its worst? night Sleep (in general) Fair  Pain is worse with: walking Pain improves with: rest and medication Relief from Meds: 9  No family history on file. Social History   Socioeconomic History   Marital status: Married    Spouse name: Not on file   Number of children: Not on file   Years of education: Not on file   Highest education level: Not on file  Occupational History   Not on file  Tobacco Use   Smoking status: Former    Current packs/day: 0.00    Types: Cigarettes    Start date: 04/17/1995    Quit date: 04/16/2000    Years since quitting: 23.2   Smokeless tobacco: Never  Vaping Use   Vaping status: Never Used  Substance and Sexual Activity   Alcohol use: Yes    Comment: weekends/special events   Drug use: Never   Sexual activity: Not Currently  Other Topics Concern   Not on file  Social History Narrative   Not on file    Social Drivers of Health   Financial Resource Strain: Not on file  Food Insecurity: Not on file  Transportation Needs: Not on file  Physical Activity: Not on file  Stress: Not on file  Social Connections: Not on file   Past Surgical History:  Procedure Laterality Date   IR CT HEAD LTD  08/07/2022   IR INTRA CRAN STENT  08/07/2022   IR PERCUTANEOUS ART THROMBECTOMY/INFUSION INTRACRANIAL INC DIAG ANGIO  07/25/2022   LOOP RECORDER INSERTION N/A 07/28/2022   Procedure: LOOP RECORDER INSERTION;  Surgeon: Lei Pump, MD;  Location: [facility] INVASIVE CV LAB;  Service: Cardiovascular;  Laterality: N/A;   RADIOLOGY WITH ANESTHESIA N/A 07/25/2022   Procedure: RADIOLOGY WITH ANESTHESIA;  Surgeon: Radiologist, Medication, MD;  Location: [facility] OR;  Service: Radiology;  Laterality: N/A;   TEE WITHOUT CARDIOVERSION N/A 07/28/2022   Procedure: TRANSESOPHAGEAL ECHOCARDIOGRAM;  Surgeon: Euell Herrlich, MD;  Location: [facility] INVASIVE CV LAB;  Service: Cardiovascular;  Laterality: N/A;   Past Surgical History:  Procedure Laterality Date   IR CT HEAD LTD  08/07/2022   IR INTRA CRAN STENT  08/07/2022   IR PERCUTANEOUS ART THROMBECTOMY/INFUSION INTRACRANIAL INC DIAG ANGIO  07/25/2022   LOOP RECORDER INSERTION N/A 07/28/2022   Procedure: LOOP RECORDER INSERTION;  Surgeon:  Lei Pump, MD;  Location: [facility] INVASIVE CV LAB;  Service: Cardiovascular;  Laterality: N/A;   RADIOLOGY WITH ANESTHESIA N/A 07/25/2022   Procedure: RADIOLOGY WITH ANESTHESIA;  Surgeon: Radiologist, Medication, MD;  Location: [facility] OR;  Service: Radiology;  Laterality: N/A;   TEE WITHOUT CARDIOVERSION N/A 07/28/2022   Procedure: TRANSESOPHAGEAL ECHOCARDIOGRAM;  Surgeon: Euell Herrlich, MD;  Location: [facility] INVASIVE CV LAB;  Service: Cardiovascular;  Laterality: N/A;   Past Medical History:  Diagnosis Date   Hypertension    Stroke (HCC)    BP (!) 161/93   Pulse (!) 59   Ht 5\' 8"  (1.727 m)   Wt 192 lb 3.2 oz (87.2 kg)   SpO2 97%   BMI  29.22 kg/m   Opioid Risk Score:   Fall Risk Score:  `1  Depression screen [facility] 2/9     07/17/2023    9:31 AM 04/17/2023   11:48 AM 03/02/2023    9:44 AM 01/19/2023    9:23 AM 12/05/2022    9:11 AM 09/07/2022    1:14 PM  Depression screen PH[facility] 2/9  Decreased Interest 0 0 0 0 0 0  Down, Depressed, Hopeless 0 0 0 0 0 0  PHQ - 2 Score 0 0 0 0 0 0  Altered sleeping      1  Tired, decreased energy      0  Change in appetite      1  Feeling bad or failure about yourself       0  Trouble concentrating      0  Moving slowly or fidgety/restless      1  Suicidal thoughts      0  PHQ-9 Score      3     Review of Systems  All other systems reviewed and are negative.      Objective:   Physical Exam   General no acute distress Mood and affect appropriate Speech without dysarthria or aphasia Upper extremity strength is 4+ at the deltoid bicep tricep grip Lower extremity strength 3 - at the hip flexor 4 - knee extensor trace ankle dorsiflexor 2 - ankle plantar flexor Tone MAS 3 at the knee flexors and extensors With standing right ankle goes into inversion along with toe flexion. With AFO on he is able to his foot however he does go up on his toes on the left side to do so.  No evidence of knee instability     Assessment & Plan:  1.  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  We discussed treatment options including botulinum toxin injection to help with his tone issues that he notices both at night as well as with ambulation. We discussed that botulinum toxin takes about 1 week to take effect and lasts approximately 3 months.  We also discussed that dosing sometimes needs to be adjusted.  We discussed that the botulinum toxin injections are not going to restore any normal movement pattern or strength to a limb affected by stroke. Botox or Xeomin Units 300U total  VMO 25 Vastus lateralis 25 Vastus intermedius  25 Rectus femoris 25 Semimembranosus 50 Semitendinosis 50 PT 75 FDL 25

## 2023-07-27 NOTE — Progress Notes (Signed)
 [facility] Summary Report / Loop Recorder

## 2023-07-27 NOTE — Addendum Note (Signed)
 Addended by: Edra Govern D on: 07/27/2023 05:34 PM   Modules accepted: Orders

## 2023-08-07 ENCOUNTER — Encounter: Admitting: Physical Medicine & Rehabilitation

## 2023-08-09 ENCOUNTER — Encounter: Payer: Self-pay | Admitting: Physical Medicine & Rehabilitation

## 2023-08-09 ENCOUNTER — Encounter: Attending: Physical Medicine & Rehabilitation | Admitting: Physical Medicine & Rehabilitation

## 2023-08-09 VITALS — BP 117/73 | HR 61 | Temp 98.1°F | Ht 68.0 in | Wt 192.6 lb

## 2023-08-09 DIAGNOSIS — G8111 Spastic hemiplegia affecting right dominant side: Secondary | ICD-10-CM | POA: Diagnosis present

## 2023-08-09 MED ORDER — SODIUM CHLORIDE (PF) 0.9 % IJ SOLN
6.0000 mL | Freq: Once | INTRAMUSCULAR | Status: AC
  Administered 2023-08-09: 6 mL via INTRAVENOUS

## 2023-08-09 MED ORDER — ONABOTULINUMTOXINA 100 UNITS IJ SOLR
300.0000 [IU] | Freq: Once | INTRAMUSCULAR | Status: AC
  Administered 2023-08-09: 300 [IU] via INTRAMUSCULAR

## 2023-08-09 NOTE — Progress Notes (Signed)
 Botox Injection for spasticity using needle EMG guidance  Dilution: 50 Units/ml Indication: Severe spasticity which interferes with ADL,mobility and/or  hygiene and is unresponsive to medication management and other conservative care Informed consent was obtained after describing risks and benefits of the procedure with the patient. This includes bleeding, bruising, infection, excessive weakness, or medication side effects. A REMS form is on file and signed. Needle: 27 g 1" needle electrode Number of units per muscle BOTOX 300U total  VMO 25 Vastus lateralis 25 Vastus intermedius 25 Rectus femoris 25 Semimembranosus 50 Semitendinosis 50 PT 75 FDL 25 All injections were done after obtaining appropriate EMG activity and after negative drawback for blood. The patient tolerated the procedure well. Post procedure instructions were given. A followup appointment was made.

## 2023-08-09 NOTE — Patient Instructions (Signed)

## 2023-08-16 ENCOUNTER — Other Ambulatory Visit: Payer: Self-pay

## 2023-08-16 ENCOUNTER — Encounter (HOSPITAL_COMMUNITY): Payer: Self-pay | Admitting: Emergency Medicine

## 2023-08-16 ENCOUNTER — Emergency Department (HOSPITAL_COMMUNITY)
Admission: EM | Admit: 2023-08-16 | Discharge: 2023-08-16 | Disposition: A | Discharge status: Home / Self Care | Attending: Emergency Medicine | Admitting: Emergency Medicine

## 2023-08-16 ENCOUNTER — Emergency Department (HOSPITAL_COMMUNITY)

## 2023-08-16 DIAGNOSIS — R55 Syncope and collapse: Secondary | ICD-10-CM | POA: Insufficient documentation

## 2023-08-16 DIAGNOSIS — Z79899 Other long term (current) drug therapy: Secondary | ICD-10-CM | POA: Insufficient documentation

## 2023-08-16 DIAGNOSIS — I10 Essential (primary) hypertension: Secondary | ICD-10-CM | POA: Insufficient documentation

## 2023-08-16 DIAGNOSIS — N289 Disorder of kidney and ureter, unspecified: Secondary | ICD-10-CM

## 2023-08-16 DIAGNOSIS — Z7982 Long term (current) use of aspirin: Secondary | ICD-10-CM | POA: Insufficient documentation

## 2023-08-16 DIAGNOSIS — W19XXXA Unspecified fall, initial encounter: Secondary | ICD-10-CM

## 2023-08-16 LAB — CBC WITH DIFFERENTIAL/PLATELET
Abs Immature Granulocytes: 0.03 K/uL (ref 0.00–0.07)
Basophils Absolute: 0.1 K/uL (ref 0.0–0.1)
Basophils Relative: 1 %
Eosinophils Absolute: 0.4 K/uL (ref 0.0–0.5)
Eosinophils Relative: 5 %
HCT: 46.9 % (ref 39.0–52.0)
Hemoglobin: 15.4 g/dL (ref 13.0–17.0)
Immature Granulocytes: 0 %
Lymphocytes Relative: 11 %
Lymphs Abs: 0.8 K/uL (ref 0.7–4.0)
MCH: 30.7 pg (ref 26.0–34.0)
MCHC: 32.8 g/dL (ref 30.0–36.0)
MCV: 93.4 fL (ref 80.0–100.0)
Monocytes Absolute: 0.7 K/uL (ref 0.1–1.0)
Monocytes Relative: 9 %
Neutro Abs: 5.5 K/uL (ref 1.7–7.7)
Neutrophils Relative %: 74 %
Platelets: 166 K/uL (ref 150–400)
RBC: 5.02 MIL/uL (ref 4.22–5.81)
RDW: 12.9 % (ref 11.5–15.5)
WBC: 7.5 K/uL (ref 4.0–10.5)
nRBC: 0 % (ref 0.0–0.2)

## 2023-08-16 LAB — I-STAT CHEM 8, ED
BUN: 42 mg/dL — ABNORMAL HIGH (ref 6–20)
Calcium, Ion: 1.18 mmol/L (ref 1.15–1.40)
Chloride: 107 mmol/L (ref 98–111)
Creatinine, Ser: 2.5 mg/dL — ABNORMAL HIGH (ref 0.61–1.24)
Glucose, Bld: 98 mg/dL (ref 70–99)
HCT: 47 % (ref 39.0–52.0)
Hemoglobin: 16 g/dL (ref 13.0–17.0)
Potassium: 4.5 mmol/L (ref 3.5–5.1)
Sodium: 144 mmol/L (ref 135–145)
TCO2: 25 mmol/L (ref 22–32)

## 2023-08-16 MED ORDER — SODIUM CHLORIDE 0.9 % IV BOLUS
500.0000 mL | Freq: Once | INTRAVENOUS | Status: DC
Start: 2023-08-16 — End: 2023-08-16

## 2023-08-16 MED ORDER — SODIUM CHLORIDE 0.9 % IV BOLUS
500.0000 mL | Freq: Once | INTRAVENOUS | Status: AC
  Administered 2023-08-16: 500 mL via INTRAVENOUS

## 2023-08-16 MED ORDER — SODIUM CHLORIDE 0.9 % IV BOLUS: 1000.0000 mL | Freq: Once | INTRAVENOUS | Status: DC

## 2023-08-16 NOTE — ED Triage Notes (Signed)
 BIB [facility], pt got up to go to bathroom. Pt does not remember falling. Pt was hypotensive and diaphoretic per EMS upon their arrival. Pt given LR bolus 1000 mL. 18 g LAC.   Hx of a stroke with rt side deficits- rt leg weakness currently.   HR 68-70 BP 143/96 BG 115

## 2023-08-16 NOTE — ED Provider Notes (Signed)
 [place] EMERGENCY DEPARTMENT AT [facility] Provider Note   CSN: [id number] Arrival date & time: 08/16/23  0404     History  Chief Complaint  Patient presents with   Joseph Bright    Joseph Bright is a 48 y.o. male.  The history is provided by the patient.  Fall This is a new problem. The current episode started 3 to 5 hours ago. The problem occurs rarely. The problem has been resolved. Pertinent negatives include no chest pain, no abdominal pain, no headaches and no shortness of breath. Nothing aggravates the symptoms. Nothing relieves the symptoms. He has tried nothing for the symptoms. The treatment provided significant relief.  Patient with HTN and renal insufficiency with residual deficits who got up and was urinating and passed out during urination.  No CP, no SOB.  No n/v.  No new weakness now numbness    Past Medical History:  Diagnosis Date   Hypertension    Stroke [facility])      Home Medications Prior to Admission medications   Medication Sig Start Date End Date Taking? Authorizing Provider  acetaminophen  (TYLENOL ) 325 MG tablet Take 325 mg by mouth daily as needed for mild pain. 08/31/22   [provider]  amLODipine  (NORVASC ) 10 MG tablet Take 1 tablet (10 mg total) by mouth daily. 11/01/22   Acharya, Gayatri A, MD  aspirin  81 MG chewable tablet 1 tablet Orally Once a day 08/31/22   [provider]  carvedilol  (COREG ) 25 MG tablet Take 1 tablet (25 mg total) by mouth 2 (two) times daily. 11/01/22 01/30/23  Acharya, Gayatri A, MD  hydrALAZINE  (APRESOLINE ) 50 MG tablet TAKE 1 TABLET BY MOUTH TWICE DAILY for 90 days    [provider]  levETIRAcetam  (KEPPRA  XR) 500 MG 24 hr tablet 1 tablet at 10 pm Orally Once a day 08/31/22   [provider]  rosuvastatin  (CRESTOR ) 20 MG tablet  08/31/22   [provider]  sodium bicarbonate  650 MG tablet 2 tablets Orally twice a day 08/31/22   [provider]  ticagrelor  (BRILINTA ) 90 MG TABS  tablet Take 90 mg by mouth 2 (two) times daily.    [provider]  tiZANidine  (ZANAFLEX ) 4 MG tablet Take 1 tablet (4 mg total) by mouth at bedtime. 12/05/22   Kirsteins, Cecilia Coe, MD      Allergies    Patient has no known allergies.    Review of Systems   Review of Systems  Respiratory:  Negative for shortness of breath, wheezing and stridor.   Cardiovascular:  Negative for chest pain.  Gastrointestinal:  Negative for abdominal pain, nausea and vomiting.  Neurological:  Negative for dizziness, facial asymmetry, speech difficulty, weakness, light-headedness and headaches.  All other systems reviewed and are negative.   Physical Exam Updated Vital Signs BP (!) 135/95   Pulse 68   Temp 97.9 F (36.6 C) (Oral)   Resp 18   Ht 5\' 8"  (1.727 m)   Wt 86.6 kg   SpO2 100%   BMI 29.04 kg/m  Physical Exam Vitals and nursing note reviewed.  Constitutional:      General: He is not in acute distress.    Appearance: Normal appearance. He is well-developed. He is not diaphoretic.  HENT:     Head: Normocephalic and atraumatic.     Nose: Nose normal.     Mouth/Throat:     Mouth: Mucous membranes are moist.     Pharynx: Oropharynx is clear.  Eyes:  Extraocular Movements: Extraocular movements intact.     Conjunctiva/sclera: Conjunctivae normal.     Pupils: Pupils are equal, round, and reactive to light.  Cardiovascular:     Rate and Rhythm: Normal rate and regular rhythm.     Pulses: Normal pulses.     Heart sounds: Normal heart sounds.  Pulmonary:     Effort: Pulmonary effort is normal.     Breath sounds: Normal breath sounds. No wheezing or rales.  Abdominal:     General: Bowel sounds are normal.     Palpations: Abdomen is soft.     Tenderness: There is no abdominal tenderness. There is no guarding or rebound.  Musculoskeletal:        General: Normal range of motion.     Cervical back: Normal range of motion and neck supple.  Skin:    General: Skin is warm and dry.      Capillary Refill: Capillary refill takes less than 2 seconds.  Neurological:     Mental Status: He is alert.     Cranial Nerves: No cranial nerve deficit.     Deep Tendon Reflexes: Reflexes normal.  Psychiatric:        Mood and Affect: Mood normal.     ED Results / Procedures / Treatments   Labs (all labs ordered are listed, but only abnormal results are displayed) Results for orders placed or performed during the hospital encounter of 08/16/23  CBC with Differential   Collection Time: 08/16/23  5:02 AM  Result Value Ref Range   WBC 7.5 4.0 - 10.5 K/uL   RBC 5.02 4.22 - 5.81 MIL/uL   Hemoglobin 15.4 13.0 - 17.0 g/dL   HCT 16.1 09.6 - 04.5 %   MCV 93.4 80.0 - 100.0 fL   MCH 30.7 26.0 - 34.0 pg   MCHC 32.8 30.0 - 36.0 g/dL   RDW 40.9 81.1 - 91.4 %   Platelets 166 150 - 400 K/uL   nRBC 0.0 0.0 - 0.2 %   Neutrophils Relative % 74 %   Neutro Abs 5.5 1.7 - 7.7 K/uL   Lymphocytes Relative 11 %   Lymphs Abs 0.8 0.7 - 4.0 K/uL   Monocytes Relative 9 %   Monocytes Absolute 0.7 0.1 - 1.0 K/uL   Eosinophils Relative 5 %   Eosinophils Absolute 0.4 0.0 - 0.5 K/uL   Basophils Relative 1 %   Basophils Absolute 0.1 0.0 - 0.1 K/uL   Immature Granulocytes 0 %   Abs Immature Granulocytes 0.03 0.00 - 0.07 K/uL  I-stat chem 8, ED (not at [facility], [facility] or [facility])   Collection Time: 08/16/23  5:07 AM  Result Value Ref Range   Sodium 144 135 - 145 mmol/L   Potassium 4.5 3.5 - 5.1 mmol/L   Chloride 107 98 - 111 mmol/L   BUN 42 (H) 6 - 20 mg/dL   Creatinine, Ser 7.82 (H) 0.61 - 1.24 mg/dL   Glucose, Bld 98 70 - 99 mg/dL   Calcium , Ion 1.18 1.15 - 1.40 mmol/L   TCO2 25 22 - 32 mmol/L   Hemoglobin 16.0 13.0 - 17.0 g/dL   HCT 95.6 21.3 - 08.6 %   CT Head Wo Contrast Result Date: 08/16/2023 CLINICAL DATA:  Fall with head injury EXAM: CT HEAD WITHOUT CONTRAST CT CERVICAL SPINE WITHOUT CONTRAST TECHNIQUE: Multidetector CT imaging of the head and cervical spine was performed following the standard  protocol without intravenous contrast. Multiplanar CT image reconstructions of the cervical spine were also  generated. RADIATION DOSE REDUCTION: This exam was performed according to the departmental dose-optimization program which includes automated exposure control, adjustment of the mA and/or kV according to patient size and/or use of iterative reconstruction technique. COMPARISON:  Brain MRI 07/26/2022 FINDINGS: CT HEAD FINDINGS Brain: No evidence of acute infarction, hemorrhage, hydrocephalus, extra-axial collection or mass lesion/mass effect. 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 Chronic lacunar infarct versus dilated perivascular space at the posterior right thalamus. Chronic lacune in the bilateral thalamus. Partially empty sella, nonspecific in isolation. Vascular: As above Skull: No acute fracture Sinuses/Orbits: No evidence of injury CT CERVICAL SPINE FINDINGS Alignment: No traumatic malalignment Skull base and vertebrae: No acute fracture Soft tissues and spinal canal: No prevertebral fluid or swelling. No visible canal hematoma. Disc levels: Degenerative change primarily affecting C5-6 disc space Upper chest: Negative IMPRESSION: No evidence of intracranial or cervical spine injury. Electronically Signed   By: Ronnette Coke M.D.   On: 08/16/2023 05:14   CT Cervical Spine Wo Contrast Result Date: 08/16/2023 CLINICAL DATA:  Fall with head injury EXAM: CT HEAD WITHOUT CONTRAST CT CERVICAL SPINE WITHOUT CONTRAST TECHNIQUE: Multidetector CT imaging of the head and cervical spine was performed following the standard protocol without intravenous contrast. Multiplanar CT image reconstructions of the cervical spine were also generated. RADIATION DOSE REDUCTION: This exam was performed according to the departmental dose-optimization program which includes automated exposure control, adjustment of the mA and/or kV according to patient size and/or use of iterative reconstruction technique.  COMPARISON:  Brain MRI 07/26/2022 FINDINGS: CT HEAD FINDINGS Brain: No evidence of acute infarction, hemorrhage, hydrocephalus, extra-axial collection or mass lesion/mass effect. 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 Chronic lacunar infarct versus dilated perivascular space at the posterior right thalamus. Chronic lacune in the bilateral thalamus. Partially empty sella, nonspecific in isolation. Vascular: As above Skull: No acute fracture Sinuses/Orbits: No evidence of injury CT CERVICAL SPINE FINDINGS Alignment: No traumatic malalignment Skull base and vertebrae: No acute fracture Soft tissues and spinal canal: No prevertebral fluid or swelling. No visible canal hematoma. Disc levels: Degenerative change primarily affecting C5-6 disc space Upper chest: Negative IMPRESSION: No evidence of intracranial or cervical spine injury. Electronically Signed   By: Ronnette Coke M.D.   On: 08/16/2023 05:14    EKG  Radiology CT Head Wo Contrast Result Date: 08/16/2023 CLINICAL DATA:  Fall with head injury EXAM: CT HEAD WITHOUT CONTRAST CT CERVICAL SPINE WITHOUT CONTRAST TECHNIQUE: Multidetector CT imaging of the head and cervical spine was performed following the standard protocol without intravenous contrast. Multiplanar CT image reconstructions of the cervical spine were also generated. RADIATION DOSE REDUCTION: This exam was performed according to the departmental dose-optimization program which includes automated exposure control, adjustment of the mA and/or kV according to patient size and/or use of iterative reconstruction technique. COMPARISON:  Brain MRI 07/26/2022 FINDINGS: CT HEAD FINDINGS Brain: No evidence of acute infarction, hemorrhage, hydrocephalus, extra-axial collection or mass lesion/mass effect. 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 Chronic lacunar infarct versus dilated perivascular space at the posterior right thalamus. Chronic lacune in the bilateral  thalamus. Partially empty sella, nonspecific in isolation. Vascular: As above Skull: No acute fracture Sinuses/Orbits: No evidence of injury CT CERVICAL SPINE FINDINGS Alignment: No traumatic malalignment Skull base and vertebrae: No acute fracture Soft tissues and spinal canal: No prevertebral fluid or swelling. No visible canal hematoma. Disc levels: Degenerative change primarily affecting C5-6 disc space Upper chest: Negative IMPRESSION: No evidence of intracranial or cervical spine  injury. Electronically Signed   By: Ronnette Coke M.D.   On: 08/16/2023 05:14   CT Cervical Spine Wo Contrast Result Date: 08/16/2023 CLINICAL DATA:  Fall with head injury EXAM: CT HEAD WITHOUT CONTRAST CT CERVICAL SPINE WITHOUT CONTRAST TECHNIQUE: Multidetector CT imaging of the head and cervical spine was performed following the standard protocol without intravenous contrast. Multiplanar CT image reconstructions of the cervical spine were also generated. RADIATION DOSE REDUCTION: This exam was performed according to the departmental dose-optimization program which includes automated exposure control, adjustment of the mA and/or kV according to patient size and/or use of iterative reconstruction technique. COMPARISON:  Brain MRI 07/26/2022 FINDINGS: CT HEAD FINDINGS Brain: No evidence of acute infarction, hemorrhage, hydrocephalus, extra-axial collection or mass lesion/mass effect. 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 Chronic lacunar infarct versus dilated perivascular space at the posterior right thalamus. Chronic lacune in the bilateral thalamus. Partially empty sella, nonspecific in isolation. Vascular: As above Skull: No acute fracture Sinuses/Orbits: No evidence of injury CT CERVICAL SPINE FINDINGS Alignment: No traumatic malalignment Skull base and vertebrae: No acute fracture Soft tissues and spinal canal: No prevertebral fluid or swelling. No visible canal hematoma. Disc levels: Degenerative change  primarily affecting C5-6 disc space Upper chest: Negative IMPRESSION: No evidence of intracranial or cervical spine injury. Electronically Signed   By: Ronnette Coke M.D.   On: 08/16/2023 05:14    Procedures Procedures    Medications Ordered in ED Medications  sodium chloride  0.9 % bolus 500 mL (500 mLs Intravenous New Bag/Given 08/16/23 0557)    ED Course/ Medical Decision Making/ A&P                                 Medical Decision Making Patient had syncope while urinating and fell   Amount and/or Complexity of Data Reviewed Independent Historian: EMS    Details: See above  External Data Reviewed: labs and notes.    Details: Previous notes and labs reviewed.  CR is better than baseline  Labs: ordered.    Details: Sodium 144, normal potassium 4.5, elevated creatinine 2.5 but better than baseline.  Normal white count 7.5, normal hemoglobin 15.4, normal platelets  Radiology: ordered and independent interpretation performed.    Details: No C spine fracture  ECG/medicine tests: ordered and independent interpretation performed.    Details: See muse   Risk Risk Details: Well appearing. This is clearly micturition syncope.  Creatinine is below recent baseline.  Hydrated in the ED. Is symptom free and at his baseline.  Stable for discharge.  Strict returns.      Final Clinical Impression(s) / ED Diagnoses Final diagnoses:  Micturition syncope   No signs of systemic illness or infection. The patient is nontoxic-appearing on exam and vital signs are within normal limits.  I have reviewed the triage vital signs and the nursing notes. Pertinent labs & imaging results that were available during my care of the patient were reviewed by me and considered in my medical decision making (see chart for details). After history, exam, and medical workup I feel the patient has been appropriately medically screened and is safe for discharge home. Pertinent diagnoses were discussed with the  patient. Patient was given return precautions. Rx / DC Orders ED Discharge Orders     None         Hazelgrace Bonham, MD 08/16/23 [phone]

## 2023-08-21 ENCOUNTER — Ambulatory Visit (INDEPENDENT_AMBULATORY_CARE_PROVIDER_SITE_OTHER): Payer: Managed Care, Other (non HMO)

## 2023-08-21 DIAGNOSIS — I63522 Cerebral infarction due to unspecified occlusion or stenosis of left anterior cerebral artery: Secondary | ICD-10-CM | POA: Diagnosis not present

## 2023-08-21 LAB — CUP PACEART REMOTE DEVICE CHECK
Date Time Interrogation Session: 20250526233123
Implantable Pulse Generator Implant Date: 20240503

## 2023-08-22 ENCOUNTER — Ambulatory Visit: Payer: Self-pay | Admitting: Cardiology

## 2023-08-27 ENCOUNTER — Encounter: Payer: Self-pay | Admitting: Internal Medicine

## 2023-09-04 NOTE — Progress Notes (Signed)
 [facility] Summary Report / Loop Recorder

## 2023-09-04 NOTE — Addendum Note (Signed)
 Addended by: Edra Govern D on: 09/04/2023 11:04 AM   Modules accepted: Orders

## 2023-09-20 ENCOUNTER — Encounter: Attending: Physical Medicine & Rehabilitation | Admitting: Physical Medicine & Rehabilitation

## 2023-09-20 ENCOUNTER — Encounter: Payer: Self-pay | Admitting: Physical Medicine & Rehabilitation

## 2023-09-20 ENCOUNTER — Ambulatory Visit (INDEPENDENT_AMBULATORY_CARE_PROVIDER_SITE_OTHER)

## 2023-09-20 VITALS — BP 122/77 | HR 71 | Ht 68.0 in | Wt 191.0 lb

## 2023-09-20 DIAGNOSIS — G8111 Spastic hemiplegia affecting right dominant side: Secondary | ICD-10-CM | POA: Insufficient documentation

## 2023-09-20 DIAGNOSIS — I63522 Cerebral infarction due to unspecified occlusion or stenosis of left anterior cerebral artery: Secondary | ICD-10-CM | POA: Diagnosis not present

## 2023-09-20 LAB — CUP PACEART REMOTE DEVICE CHECK
Date Time Interrogation Session: 20250625234700
Implantable Pulse Generator Implant Date: 20240503

## 2023-09-20 NOTE — Progress Notes (Signed)
 Subjective:    Patient ID: Joseph Bright, male    DOB: 1975/04/23, 48 y.o.   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  He is here for spasticity management.  He underwent botulinum toxin injection on 08/09/2023  BOTOX  300U total  VMO 25 Vastus lateralis 25 Vastus intermedius 25 Rectus femoris 25 Semimembranosus 50 Semitendinosis 50 PT 75 FDL 25 Pain Inventory Average Pain 1 Pain Right Now 1 My pain is annoying  In the last 24 hours, has pain interfered with the following? General activity 4 Relation with others 4 Enjoyment of life 5 What TIME of day is your pain at its worst? night Sleep (in general) Fair  Pain is worse with: laying down sleeping Pain improves with: medication Relief from Meds: 7  No family history on file. Social History   Socioeconomic History   Marital status: Married    Spouse name: Not on file   Number of children: Not on file   Years of education: Not on file   Highest education level: Not on file  Occupational History   Not on file  Tobacco Use   Smoking status: Former    Current packs/day: 0.00    Types: Cigarettes    Start date: 04/17/1995    Quit date: 04/16/2000    Years since quitting: 23.4   Smokeless tobacco: Never  Vaping Use   Vaping status: Never Used  Substance and Sexual Activity   Alcohol use: Yes    Comment: weekends/special events   Drug use: Never   Sexual activity: Not Currently  Other Topics Concern   Not on file  Social History Narrative   Not on file   Social Drivers of Health   Financial Resource Strain: Not on file  Food Insecurity: Not on file  Transportation Needs: Not on file  Physical Activity: Not on file  Stress: Not on file  Social Connections: Not on file   Past Surgical History:  Procedure Laterality Date   IR CT HEAD LTD  08/07/2022   IR INTRA CRAN STENT  08/07/2022   IR PERCUTANEOUS ART THROMBECTOMY/INFUSION  INTRACRANIAL INC DIAG ANGIO  07/25/2022   LOOP RECORDER INSERTION N/A 07/28/2022   Procedure: LOOP RECORDER INSERTION;  Surgeon: Inocencio Soyla Lunger, MD;  Location: [facility] INVASIVE CV LAB;  Service: Cardiovascular;  Laterality: N/A;   RADIOLOGY WITH ANESTHESIA N/A 07/25/2022   Procedure: RADIOLOGY WITH ANESTHESIA;  Surgeon: Radiologist, Medication, MD;  Location: [facility] OR;  Service: Radiology;  Laterality: N/A;   TEE WITHOUT CARDIOVERSION N/A 07/28/2022   Procedure: TRANSESOPHAGEAL ECHOCARDIOGRAM;  Surgeon: Loni Soyla LABOR, MD;  Location: [facility] INVASIVE CV LAB;  Service: Cardiovascular;  Laterality: N/A;   Past Surgical History:  Procedure Laterality Date   IR CT HEAD LTD  08/07/2022   IR INTRA CRAN STENT  08/07/2022   IR PERCUTANEOUS ART THROMBECTOMY/INFUSION INTRACRANIAL INC DIAG ANGIO  07/25/2022   LOOP RECORDER INSERTION N/A 07/28/2022   Procedure: LOOP RECORDER INSERTION;  Surgeon: Inocencio Soyla Lunger, MD;  Location: [facility] INVASIVE CV LAB;  Service: Cardiovascular;  Laterality: N/A;   RADIOLOGY WITH ANESTHESIA N/A 07/25/2022   Procedure: RADIOLOGY WITH ANESTHESIA;  Surgeon: Radiologist, Medication, MD;  Location: [facility] OR;  Service: Radiology;  Laterality: N/A;   TEE WITHOUT CARDIOVERSION N/A 07/28/2022   Procedure: TRANSESOPHAGEAL ECHOCARDIOGRAM;  Surgeon: Loni Soyla LABOR, MD;  Location: [facility] INVASIVE CV LAB;  Service: Cardiovascular;  Laterality: N/A;   Past Medical History:  Diagnosis Date  Hypertension    Stroke (HCC)    BP 122/77   Pulse 71   Ht 5' 8 (1.727 m)   Wt 191 lb (86.6 kg)   SpO2 96%   BMI 29.04 kg/m   Opioid Risk Score:   Fall Risk Score:  `1  Depression screen [facility] 2/9     07/17/2023    9:31 AM 04/17/2023   11:48 AM 03/02/2023    9:44 AM 01/19/2023    9:23 AM 12/05/2022    9:11 AM 09/07/2022    1:14 PM  Depression screen PHQ 2/9  Decreased Interest 0 0 0 0 0 0  Down, Depressed, Hopeless 0 0 0 0 0 0  PHQ - 2 Score 0 0 0 0 0 0  Altered sleeping      1  Tired, decreased  energy      0  Change in appetite      1  Feeling bad or failure about yourself       0  Trouble concentrating      0  Moving slowly or fidgety/restless      1  Suicidal thoughts      0  PHQ-9 Score      3    Review of Systems  Musculoskeletal:        Right side of body pain  All other systems reviewed and are negative.     Objective:   Physical Exam  General No acute distress Mood and affect appropriate Motor strength is 4 - right quad and hamstring 3 - right ankle dorsiflexion, 4 - ankle plantarflexion Tone is increased in the hamstring MAS 2 Quad MAS 1 Foot inverters MAS 0 Toe flexors MAS 1 however with standing there is toe curling of the big toe through fifth toes on the right side      Assessment & Plan:  1.  Left ACA distribution infarct and as expected affecting the right lower extremity greater than right upper extremity.  He did benefit from the botulinum toxin injection and minor adjustments will be made for the next injection in 6 weeks.  Specifically we will not inject the vastus lateralis and instead use the 25 units in the right flexor hallucis longus BOTOX  300U total  VMO 25 Vastus intermedius 25 Rectus femoris 25 Semimembranosus 50 Semitendinosis 50 PT 75 FDL 25 FHL 25

## 2023-09-23 ENCOUNTER — Ambulatory Visit: Payer: Self-pay | Admitting: Cardiology

## 2023-09-24 ENCOUNTER — Other Ambulatory Visit: Payer: Self-pay | Admitting: Internal Medicine

## 2023-09-26 ENCOUNTER — Other Ambulatory Visit: Payer: Self-pay

## 2023-09-26 MED ORDER — CARVEDILOL 25 MG PO TABS: 25.0000 mg | ORAL_TABLET | Freq: Two times a day (BID) | ORAL | 1 refills | Status: DC

## 2023-10-10 NOTE — Progress Notes (Signed)
 [facility] Summary Report / Loop Recorder

## 2023-10-22 ENCOUNTER — Ambulatory Visit (INDEPENDENT_AMBULATORY_CARE_PROVIDER_SITE_OTHER)

## 2023-10-22 DIAGNOSIS — I63522 Cerebral infarction due to unspecified occlusion or stenosis of left anterior cerebral artery: Secondary | ICD-10-CM

## 2023-10-22 LAB — CUP PACEART REMOTE DEVICE CHECK
Date Time Interrogation Session: 20250727234752
Implantable Pulse Generator Implant Date: 20240503

## 2023-10-23 ENCOUNTER — Ambulatory Visit: Payer: Self-pay | Admitting: Cardiology

## 2023-11-01 ENCOUNTER — Encounter: Admitting: Physical Medicine & Rehabilitation

## 2023-11-02 ENCOUNTER — Encounter: Payer: Self-pay | Admitting: Physical Medicine & Rehabilitation

## 2023-11-02 ENCOUNTER — Encounter: Attending: Physical Medicine & Rehabilitation | Admitting: Physical Medicine & Rehabilitation

## 2023-11-02 VITALS — BP 146/83 | HR 69 | Ht 68.0 in | Wt 197.0 lb

## 2023-11-02 DIAGNOSIS — G8111 Spastic hemiplegia affecting right dominant side: Secondary | ICD-10-CM | POA: Diagnosis not present

## 2023-11-02 MED ORDER — SODIUM CHLORIDE (PF) 0.9 % IJ SOLN
6.0000 mL | Freq: Once | INTRAMUSCULAR | Status: AC
  Administered 2023-11-02: 6 mL

## 2023-11-02 MED ORDER — ONABOTULINUMTOXINA 100 UNITS IJ SOLR
300.0000 [IU] | Freq: Once | INTRAMUSCULAR | Status: AC
  Administered 2023-11-02: 300 [IU] via INTRAMUSCULAR

## 2023-11-02 NOTE — Progress Notes (Signed)
 Botox  Injection for spasticity using needle EMG guidance  Dilution: 50 Units/ml Indication: Severe spasticity which interferes with ADL,mobility and/or  hygiene and is unresponsive to medication management and other conservative care Informed consent was obtained after describing risks and benefits of the procedure with the patient. This includes bleeding, bruising, infection, excessive weakness, or medication side effects. A REMS form is on file and signed. Needle: 27g 1 needle electrode Number of units per muscle BOTOX  300U total  VMO 25 Vastus intermedius 25 Rectus femoris 25 Semimembranosus 50 Semitendinosis 50 PT 75 FDL 25 FHL 25 All injections were done after obtaining appropriate EMG activity and after negative drawback for blood. The patient tolerated the procedure well. Post procedure instructions were given. A followup appointment was made.

## 2023-11-08 NOTE — Telephone Encounter (Signed)
 error

## 2023-11-22 ENCOUNTER — Ambulatory Visit

## 2023-11-22 DIAGNOSIS — I63522 Cerebral infarction due to unspecified occlusion or stenosis of left anterior cerebral artery: Secondary | ICD-10-CM | POA: Diagnosis not present

## 2023-11-23 LAB — CUP PACEART REMOTE DEVICE CHECK
Date Time Interrogation Session: 20250827233634
Implantable Pulse Generator Implant Date: 20240503

## 2023-11-30 ENCOUNTER — Ambulatory Visit: Payer: Self-pay | Admitting: Cardiology

## 2023-11-30 NOTE — Progress Notes (Signed)
 Remote Loop Recorder Transmission

## 2023-12-10 NOTE — Progress Notes (Signed)
 [facility] Summary Report / Loop Recorder

## 2023-12-24 ENCOUNTER — Ambulatory Visit (INDEPENDENT_AMBULATORY_CARE_PROVIDER_SITE_OTHER)

## 2023-12-24 DIAGNOSIS — I63522 Cerebral infarction due to unspecified occlusion or stenosis of left anterior cerebral artery: Secondary | ICD-10-CM

## 2023-12-24 LAB — CUP PACEART REMOTE DEVICE CHECK
Date Time Interrogation Session: 20250928233643
Implantable Pulse Generator Implant Date: 20240503

## 2023-12-25 ENCOUNTER — Ambulatory Visit: Payer: Self-pay | Admitting: Cardiology

## 2023-12-26 ENCOUNTER — Other Ambulatory Visit: Payer: Self-pay | Admitting: Internal Medicine

## 2023-12-26 NOTE — Progress Notes (Signed)
 Remote Loop Recorder Transmission

## 2023-12-27 NOTE — Progress Notes (Signed)
 Remote Loop Recorder Transmission

## 2024-01-01 ENCOUNTER — Telehealth: Payer: Self-pay | Admitting: Internal Medicine

## 2024-01-01 ENCOUNTER — Encounter: Payer: Self-pay | Admitting: Internal Medicine

## 2024-01-01 MED ORDER — CARVEDILOL 25 MG PO TABS: 25.0000 mg | ORAL_TABLET | Freq: Two times a day (BID) | ORAL | 0 refills | Status: DC

## 2024-01-01 NOTE — Telephone Encounter (Signed)
*  STAT* If patient is at the pharmacy, call can be transferred to refill team.   1. Which medications need to be refilled? (please list name of each medication and dose if known) carvedilol  (COREG ) 25 MG tablet (Expired)    2. Would you like to learn more about the convenience, safety, & potential cost savings by using the [facility]? No   3. Are you open to using the [facility] (Type [facility]. No   4. Which pharmacy/location (including street and city if local pharmacy) is medication to be sent to? [id number] - [place], [place] - [address] AT [facility] [address]     5. Do they need a 30 day or 90 day supply? 90 day

## 2024-01-01 NOTE — Telephone Encounter (Signed)
 RX sent in

## 2024-01-03 ENCOUNTER — Encounter

## 2024-01-22 ENCOUNTER — Other Ambulatory Visit: Payer: Self-pay | Admitting: Internal Medicine

## 2024-01-24 ENCOUNTER — Ambulatory Visit

## 2024-01-24 DIAGNOSIS — I63522 Cerebral infarction due to unspecified occlusion or stenosis of left anterior cerebral artery: Secondary | ICD-10-CM | POA: Diagnosis not present

## 2024-01-24 LAB — CUP PACEART REMOTE DEVICE CHECK
Date Time Interrogation Session: 20251029233041
Implantable Pulse Generator Implant Date: 20240503

## 2024-01-25 ENCOUNTER — Ambulatory Visit: Payer: Self-pay | Admitting: Cardiology

## 2024-01-29 NOTE — Progress Notes (Signed)
 Remote Loop Recorder Transmission

## 2024-02-04 ENCOUNTER — Encounter

## 2024-02-05 ENCOUNTER — Encounter: Attending: Physical Medicine & Rehabilitation | Admitting: Physical Medicine & Rehabilitation

## 2024-02-05 ENCOUNTER — Encounter: Payer: Self-pay | Admitting: Physical Medicine & Rehabilitation

## 2024-02-05 VITALS — BP 151/96 | HR 58 | Ht 68.0 in

## 2024-02-05 DIAGNOSIS — I63522 Cerebral infarction due to unspecified occlusion or stenosis of left anterior cerebral artery: Secondary | ICD-10-CM | POA: Diagnosis present

## 2024-02-05 DIAGNOSIS — G8111 Spastic hemiplegia affecting right dominant side: Secondary | ICD-10-CM | POA: Insufficient documentation

## 2024-02-05 DIAGNOSIS — I69351 Hemiplegia and hemiparesis following cerebral infarction affecting right dominant side: Secondary | ICD-10-CM | POA: Diagnosis present

## 2024-02-05 MED ORDER — SODIUM CHLORIDE (PF) 0.9 % IJ SOLN
6.0000 mL | Freq: Once | INTRAMUSCULAR | Status: AC
  Administered 2024-02-05: 6 mL

## 2024-02-05 MED ORDER — ONABOTULINUMTOXINA 100 UNITS IJ SOLR
300.0000 [IU] | Freq: Once | INTRAMUSCULAR | Status: AC
  Administered 2024-02-05: 300 [IU] via INTRAMUSCULAR

## 2024-02-05 NOTE — Progress Notes (Signed)
 Botox  Injection for spasticity using needle EMG guidance  Dilution: 50 Units/ml Indication: Severe spasticity which interferes with ADL,mobility and/or  hygiene and is unresponsive to medication management and other conservative care Informed consent was obtained after describing risks and benefits of the procedure with the patient. This includes bleeding, bruising, infection, excessive weakness, or medication side effects. A REMS form is on file and signed. Needle: 27g 1 needle electrode Number of units per muscle BOTOX  300U total  VMO 25 Vastus intermedius 25 Rectus femoris 25 Semimembranosus 50 Semitendinosis 50 PT 75 FDL 25 FHL 25 All injections were done after obtaining appropriate EMG activity and after negative drawback for blood. The patient tolerated the procedure well. Post procedure instructions were given. A followup appointment was made.

## 2024-02-13 ENCOUNTER — Encounter: Payer: Self-pay | Admitting: Internal Medicine

## 2024-02-14 MED ORDER — CARVEDILOL 25 MG PO TABS: 25.0000 mg | ORAL_TABLET | Freq: Two times a day (BID) | ORAL | 0 refills | Status: DC

## 2024-02-18 MED ORDER — CARVEDILOL 25 MG PO TABS: 25.0000 mg | ORAL_TABLET | Freq: Two times a day (BID) | ORAL | 0 refills | Status: DC

## 2024-02-18 NOTE — Telephone Encounter (Signed)
 Pt c/o medication issue:  1. Name of Medication: carvedilol  (COREG ) 25 MG tablet   2. How are you currently taking this medication (dosage and times per day)? Take 1 tablet (25 mg total) by mouth 2 (two) times daily. Must keep appointment with Dr. Loni in January 2026 for future refills.   3. Are you having a reaction (difficulty breathing--STAT)? No  4. What is your medication issue? Patient requested for us  to send this medication to [facility]. This medication was sent to [facility] by mistake. Please advise.

## 2024-02-18 NOTE — Telephone Encounter (Signed)
 Sent to [facility]

## 2024-02-19 ENCOUNTER — Other Ambulatory Visit: Payer: Self-pay | Admitting: Internal Medicine

## 2024-02-24 ENCOUNTER — Ambulatory Visit

## 2024-02-25 ENCOUNTER — Ambulatory Visit

## 2024-02-26 ENCOUNTER — Ambulatory Visit

## 2024-02-26 DIAGNOSIS — I63522 Cerebral infarction due to unspecified occlusion or stenosis of left anterior cerebral artery: Secondary | ICD-10-CM | POA: Diagnosis not present

## 2024-02-27 LAB — CUP PACEART REMOTE DEVICE CHECK
Date Time Interrogation Session: 20251201232626
Implantable Pulse Generator Implant Date: 20240503

## 2024-02-28 ENCOUNTER — Ambulatory Visit: Payer: Self-pay | Admitting: Cardiology

## 2024-02-29 NOTE — Progress Notes (Signed)
 Remote Loop Recorder Transmission

## 2024-03-28 ENCOUNTER — Ambulatory Visit: Attending: Cardiology

## 2024-03-28 DIAGNOSIS — I63522 Cerebral infarction due to unspecified occlusion or stenosis of left anterior cerebral artery: Secondary | ICD-10-CM | POA: Diagnosis not present

## 2024-03-28 LAB — CUP PACEART REMOTE DEVICE CHECK
Date Time Interrogation Session: 20260101231710
Implantable Pulse Generator Implant Date: 20240503

## 2024-03-29 ENCOUNTER — Ambulatory Visit: Payer: Self-pay | Admitting: Cardiology

## 2024-03-30 ENCOUNTER — Other Ambulatory Visit: Payer: Self-pay | Admitting: Internal Medicine

## 2024-04-02 NOTE — Progress Notes (Signed)
 Remote Loop Recorder Transmission

## 2024-04-07 ENCOUNTER — Encounter: Payer: Self-pay | Admitting: *Deleted

## 2024-04-08 ENCOUNTER — Ambulatory Visit: Attending: Internal Medicine | Admitting: Internal Medicine

## 2024-04-08 ENCOUNTER — Encounter: Payer: Self-pay | Admitting: Internal Medicine

## 2024-04-08 VITALS — BP 147/89 | HR 74 | Ht 68.0 in | Wt 215.2 lb

## 2024-04-08 DIAGNOSIS — E782 Mixed hyperlipidemia: Secondary | ICD-10-CM

## 2024-04-08 DIAGNOSIS — I10 Essential (primary) hypertension: Secondary | ICD-10-CM

## 2024-04-08 DIAGNOSIS — I63522 Cerebral infarction due to unspecified occlusion or stenosis of left anterior cerebral artery: Secondary | ICD-10-CM | POA: Diagnosis not present

## 2024-04-08 DIAGNOSIS — N189 Chronic kidney disease, unspecified: Secondary | ICD-10-CM | POA: Diagnosis not present

## 2024-04-08 DIAGNOSIS — I517 Cardiomegaly: Secondary | ICD-10-CM | POA: Diagnosis not present

## 2024-04-08 DIAGNOSIS — Z79899 Other long term (current) drug therapy: Secondary | ICD-10-CM

## 2024-04-08 MED ORDER — CARVEDILOL 25 MG PO TABS: 25.0000 mg | ORAL_TABLET | Freq: Two times a day (BID) | ORAL | 3 refills | Status: AC

## 2024-04-08 NOTE — Progress Notes (Signed)
 " Cardiology Office Note:  .   Date:  04/08/2024  ID:  Joseph Bright, DOB 1975-10-22, MRN [id number] PCP: Arloa Elsie SAUNDERS, MD  [facility] Providers Cardiologist:  Soyla DELENA Merck, MD    History of Present Illness: Joseph Bright is a 49 y.o. male.  Discussed the use of AI scribe software for clinical note transcription with the patient, who gave verbal consent to proceed.  History of Present Illness Joseph Bright is a 49 year old male with hypertension and prior stroke who presents for a cardiovascular follow-up.  He reports doing well over the past year. He uses a cane at home and brought a walker today for the longer distance but feels his strength is improving.  He has stroke and hypertension. Home blood pressure is usually about 120/80 mmHg. It was 147/89 mmHg in clinic today and 130/89 mmHg at a nephrology visit in December. He takes carvedilol  25 mg twice daily, hydralazine  50 mg twice daily, and olmesartan/HCTZ 40/25 mg daily.  He takes rosuvastatin  20 mg daily with good lipid control on labs from October 2024 and a daily aspirin  81 mg.  He quit smoking in 2001. He has no chest pain, shortness of breath, or leg swelling.    ROS: negative except per HPI above.  Studies Reviewed: .        Results Labs Cholesterol panel (12/2022): Within normal limits  Radiology Cardiac MRI (02/2023): Normal biventricular systolic function  Diagnostic EKG (07/2023): Normal Risk Assessment/Calculations:       Physical Exam:   VS:  BP (!) 147/89 (BP Location: Left Arm, Patient Position: Sitting)   Pulse 74   Ht 5' 8 (1.727 m)   Wt 215 lb 3.2 oz (97.6 kg)   SpO2 97%   BMI 32.72 kg/m    Wt Readings from Last 3 Encounters:  04/08/24 215 lb 3.2 oz (97.6 kg)  11/02/23 197 lb (89.4 kg)  09/20/23 191 lb (86.6 kg)     Physical Exam VITALS: BP- 147/89 GENERAL: Alert, cooperative, well developed, no acute distress HEENT: Normocephalic, normal oropharynx, moist  mucous membranes CHEST: Clear to auscultation bilaterally, no wheezes, rhonchi, or crackles CARDIOVASCULAR: Normal heart rate and rhythm, S1 and S2 normal without murmurs ABDOMEN: Soft, non-tender, non-distended, without organomegaly, normal bowel sounds EXTREMITIES: No cyanosis or edema NEUROLOGICAL: Cranial nerves grossly intact, moves all extremities without gross motor or sensory deficit   ASSESSMENT AND PLAN: .    Assessment and Plan Assessment & Plan Hypertension Blood pressure slightly elevated at 147/89 mmHg, home readings lower. Current regimen includes carvedilol , hydralazine , olmesartan, and HCTZ. Carvedilol  and olmesartan for heart function, hydralazine  and HCTZ for blood pressure.  - Refilled carvedilol  for 90 days supply for a year. - Continue current antihypertensive regimen: carvedilol  25 mg twice daily, hydralazine  50 mg twice daily, and olmesartan/HCTZ 40/25 mg daily.  Chronic kidney disease stage 4 Managed by nephrologist. Blood pressure control crucial to prevent kidney damage. Recent readings at nephrologist's office 130/89 mmHg, indicating good control. - Continue current blood pressure management regimen: carvedilol  25 mg twice daily, hydralazine  50 mg twice daily, and olmesartan/HCTZ 40/25 mg daily.  History of stroke No current stroke-related issues. Previous MRI showed good heart function, no heart failure concerns. No recent chest pain or shortness of breath. - Continue current medications including baby aspirin  81 mg daily.  Hyperlipidemia Cholesterol well-controlled as of October 2024. On rosuvastatin  20 mg daily. No recent cholesterol check. - Have primary care provider check cholesterol levels  at next appointment. - Continue rosuvastatin  20 mg daily.        Soyla Merck, MD, [facility] "

## 2024-04-08 NOTE — Patient Instructions (Signed)
 Medication Instructions:  No Changes  Lab Work: None  Follow-Up: At [facility], you and your health needs are our priority.  As part of our continuing mission to provide you with exceptional heart care, our providers are all part of one team.  This team includes your primary Cardiologist (physician) and Advanced Practice Providers or APPs (Physician Assistants and Nurse Practitioners) who all work together to provide you with the care you need, when you need it.  Your next appointment:   1 year(s)  Provider:   Gayatri A Acharya, MD

## 2024-04-28 ENCOUNTER — Ambulatory Visit

## 2024-04-29 LAB — CUP PACEART REMOTE DEVICE CHECK
Date Time Interrogation Session: 20260201233928
Implantable Pulse Generator Implant Date: 20240503

## 2024-05-08 ENCOUNTER — Encounter: Admitting: Physical Medicine & Rehabilitation

## 2024-05-29 ENCOUNTER — Ambulatory Visit

## 2024-06-29 ENCOUNTER — Ambulatory Visit

## 2024-07-30 ENCOUNTER — Ambulatory Visit

## 2024-08-30 ENCOUNTER — Ambulatory Visit

## 2024-09-30 ENCOUNTER — Ambulatory Visit

## 2024-10-31 ENCOUNTER — Ambulatory Visit

## 2024-12-01 ENCOUNTER — Ambulatory Visit

## 2025-01-01 ENCOUNTER — Ambulatory Visit

## 2025-02-01 ENCOUNTER — Ambulatory Visit

## 2025-03-04 ENCOUNTER — Ambulatory Visit

## 2025-04-04 ENCOUNTER — Ambulatory Visit
